# Patient Record
Sex: Female | Born: 1940 | Race: White | Hispanic: No | Marital: Single | Smoking: Never smoker
Health system: Southern US, Community
[De-identification: ages and names within clinical notes are randomized; demographics above are authoritative.]

## PROBLEM LIST (undated history)

## (undated) DIAGNOSIS — D126 Benign neoplasm of colon, unspecified: Secondary | ICD-10-CM

## (undated) DIAGNOSIS — G479 Sleep disorder, unspecified: Secondary | ICD-10-CM

## (undated) DIAGNOSIS — Z8619 Personal history of other infectious and parasitic diseases: Secondary | ICD-10-CM

## (undated) DIAGNOSIS — E785 Hyperlipidemia, unspecified: Secondary | ICD-10-CM

## (undated) DIAGNOSIS — G40909 Epilepsy, unspecified, not intractable, without status epilepticus: Secondary | ICD-10-CM

## (undated) DIAGNOSIS — S82853A Displaced trimalleolar fracture of unspecified lower leg, initial encounter for closed fracture: Secondary | ICD-10-CM

## (undated) DIAGNOSIS — K219 Gastro-esophageal reflux disease without esophagitis: Secondary | ICD-10-CM

## (undated) DIAGNOSIS — Z9049 Acquired absence of other specified parts of digestive tract: Secondary | ICD-10-CM

## (undated) DIAGNOSIS — K5792 Diverticulitis of intestine, part unspecified, without perforation or abscess without bleeding: Secondary | ICD-10-CM

## (undated) DIAGNOSIS — T7840XA Allergy, unspecified, initial encounter: Secondary | ICD-10-CM

## (undated) DIAGNOSIS — F1020 Alcohol dependence, uncomplicated: Secondary | ICD-10-CM

## (undated) DIAGNOSIS — I10 Essential (primary) hypertension: Secondary | ICD-10-CM

## (undated) DIAGNOSIS — R112 Nausea with vomiting, unspecified: Secondary | ICD-10-CM

## (undated) DIAGNOSIS — Z9889 Other specified postprocedural states: Secondary | ICD-10-CM

## (undated) DIAGNOSIS — F419 Anxiety disorder, unspecified: Secondary | ICD-10-CM

## (undated) DIAGNOSIS — J189 Pneumonia, unspecified organism: Secondary | ICD-10-CM

## (undated) DIAGNOSIS — R351 Nocturia: Secondary | ICD-10-CM

## (undated) DIAGNOSIS — A6920 Lyme disease, unspecified: Secondary | ICD-10-CM

## (undated) DIAGNOSIS — K8020 Calculus of gallbladder without cholecystitis without obstruction: Secondary | ICD-10-CM

## (undated) DIAGNOSIS — T8859XA Other complications of anesthesia, initial encounter: Secondary | ICD-10-CM

## (undated) DIAGNOSIS — R569 Unspecified convulsions: Secondary | ICD-10-CM

## (undated) DIAGNOSIS — R011 Cardiac murmur, unspecified: Secondary | ICD-10-CM

## (undated) DIAGNOSIS — K819 Cholecystitis, unspecified: Secondary | ICD-10-CM

## (undated) DIAGNOSIS — T4145XA Adverse effect of unspecified anesthetic, initial encounter: Secondary | ICD-10-CM

## (undated) DIAGNOSIS — Z8489 Family history of other specified conditions: Secondary | ICD-10-CM

## (undated) DIAGNOSIS — M6740 Ganglion, unspecified site: Secondary | ICD-10-CM

## (undated) DIAGNOSIS — C801 Malignant (primary) neoplasm, unspecified: Secondary | ICD-10-CM

## (undated) HISTORY — DX: Calculus of gallbladder without cholecystitis without obstruction: K80.20

## (undated) HISTORY — DX: Acquired absence of other specified parts of digestive tract: Z90.49

## (undated) HISTORY — PX: COLONOSCOPY: SHX174

## (undated) HISTORY — DX: Lyme disease, unspecified: A69.20

## (undated) HISTORY — DX: Personal history of other infectious and parasitic diseases: Z86.19

## (undated) HISTORY — DX: Alcohol dependence, uncomplicated: F10.20

## (undated) HISTORY — PX: MOUTH SURGERY: SHX715

## (undated) HISTORY — PX: TUBAL LIGATION: SHX77

## (undated) HISTORY — DX: Anxiety disorder, unspecified: F41.9

## (undated) HISTORY — DX: Allergy, unspecified, initial encounter: T78.40XA

## (undated) HISTORY — DX: Unspecified convulsions: R56.9

## (undated) HISTORY — PX: WISDOM TOOTH EXTRACTION: SHX21

## (undated) HISTORY — DX: Ganglion, unspecified site: M67.40

---
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
## 2007-07-09 HISTORY — PX: BACK SURGERY: SHX140

## 2007-07-09 HISTORY — PX: THORACIC DISCECTOMY: SHX6113

## 2012-10-29 ENCOUNTER — Ambulatory Visit: Payer: Medicare Other | Admitting: Neurology

## 2012-11-03 ENCOUNTER — Encounter: Payer: Self-pay | Admitting: Neurology

## 2012-11-03 ENCOUNTER — Ambulatory Visit (INDEPENDENT_AMBULATORY_CARE_PROVIDER_SITE_OTHER): Payer: Medicare Other | Admitting: Neurology

## 2012-11-03 VITALS — BP 116/74 | HR 65 | Ht 63.5 in | Wt 136.0 lb

## 2012-11-03 DIAGNOSIS — R569 Unspecified convulsions: Secondary | ICD-10-CM

## 2012-11-03 DIAGNOSIS — G40909 Epilepsy, unspecified, not intractable, without status epilepticus: Secondary | ICD-10-CM | POA: Insufficient documentation

## 2012-11-03 NOTE — Progress Notes (Addendum)
[facility]    Provider:  Dr Hosie Poisson Referring Provider: Maryelizabeth Rowan, MD Primary Care Physician:  No primary provider on file.  CC:  Seizure disorder  HPI:  Joan Mclaughlin is a 72 y.o. female here as a referral from Dr. Duanne Guess for seizure disorder  Patient recently moved from [place], here to establish care. Has history of seizure disorder, believes she may have had seizures as a child, thinks he may have had around for seizures as an adult. Most recent seizures were around 1 year ago, at that time she attributes them to self decreasing her Trileptal and taking an inappropriate sleeping pill. Has been seizure-free for greater than one year. Currently on a regimen of Trileptal 300 mg twice a day. Tolerating this medication well. No adverse effects noted. Has been on Keppra in the past, unable to tolerate this medication. Unclear etiology of seizures. Denies any history of motor vehicle accident, head trauma. Does have a brother with seizures.  During her episode she completely blacks out, has foaming at the mouth, unsure if there are any abnormal movements. Does not have any recollection of the events. No history of tongue biting, has lost control bowels once. No aura or prodrome prior to the event.  Notes difficulty sleeping, uses lorazepam 1 mg at nighttime for this.  Review of Systems: Out of a complete 14 system review, the patient complains of only the following symptoms, and all other reviewed systems are negative. Positive for easy bruising anxiety  History   Social History  . Marital Status: Divorced    Spouse Name: N/A    Number of Children: N/A  . Years of Education: 14   Occupational History  . retired    Social History Main Topics  . Smoking status: Never Smoker   . Smokeless tobacco: Never Used  . Alcohol Use: No  . Drug Use: No  . Sexual Activity: Not on file   Other Topics Concern  . Not on file   Social History Narrative   Patient  lives alone.   patient has 4 grandchildren.   Patient is retired   Patient has 2 years of college.   Patient is right handed.          Family History  Problem Relation Age of Onset  . Arthritis Father     Past Medical History  Diagnosis Date  . Seizures     Past Surgical History  Procedure Laterality Date  . Back surgery  07/2007    Current Outpatient Prescriptions  Medication Sig Dispense Refill  . LORazepam (ATIVAN) 1 MG tablet 1 mg as needed.      . Oxcarbazepine (TRILEPTAL) 300 MG tablet 300 mg 2 (two) times daily.      . valACYclovir (VALTREX) 500 MG tablet 500 mg as needed.       No current facility-administered medications for this visit.    Allergies as of 11/03/2012  . (No Known Allergies)    Vitals: BP 116/74  Pulse 65  Ht 5' 3.5" (1.613 m)  Wt 136 lb (61.689 kg)  BMI 23.71 kg/m2 Last Weight:  Wt Readings from Last 1 Encounters:  11/03/12 136 lb (61.689 kg)   Last Height:   Ht Readings from Last 1 Encounters:  11/03/12 5' 3.5" (1.613 m)     Physical exam: Exam: Gen: NAD, conversant Eyes: anicteric sclerae, moist conjunctivae HENT: Atraumatic, oropharynx clear Neck: Trachea midline; supple,  Lungs: CTA, no wheezing, rales, rhonic  CV: RRR, no MRG Abdomen: Soft, non-tender;  Extremities: No peripheral edema  Skin: Normal temperature, no rash,  Psych: Appropriate affect, pleasant  Neuro: MS: AA&Ox3, appropriately interactive, normal affect   Speech: fluent w/o paraphasic error  Memory: good recent and remote recall  CN: PERRL, EOMI no nystagmus, no ptosis, sensation intact to LT V1-V3 bilat, face symmetric, no weakness, hearing grossly intact, palate elevates symmetrically, shoulder shrug 5/5 bilat,  tongue protrudes midline, no fasiculations noted.  Motor: normal bulk and tone Strength: 5/5  In all extremities  Coord: rapid alternating and point-to-point (FNF, HTS) movements intact.  Reflexes:  symmetrical, bilat downgoing toes  Sens: LT intact in all extremities  Gait: posture, stance, stride and arm-swing normal. Tandem gait intact. Able to walk on heels and toes. Romberg absent.   Assessment:  After physical and neurologic examination, review of laboratory studies, imaging, neurophysiology testing and pre-existing records, assessment will be reviewed on the problem list.  Plan:  Treatment plan and additional workup will be reviewed under Problem List.  1)Seizure disorder 2)Insomnia  72 year old woman with history of seizure disorder presenting for initial evaluation and transfer of care. Patient is currently well controlled on a regimen of Trileptal 300 mg twice a day. Tolerating well with no adverse effects. Physical exam is unremarkable. Patient has been seizure-free greater than one year on this current dosing schedule. Will not make any changes at this time. Patient also continues to take lorazepam 1 mg nightly for sleep difficulty. Will continue at this current dose 2. Patient wishes to have medications dispensed via primary care physician. Will send copy of note to PCP, Dr Duanne Guess. Will be happy to fill prescriptions if patient changes mind. Follow up in 1 year or early if needed.

## 2012-11-03 NOTE — Patient Instructions (Signed)
Overall you are doing fairly well but I do want to suggest a few things today:   Remember to drink plenty of fluid, eat healthy meals and do not skip any meals. Try to eat protein with a every meal and eat a healthy snack such as fruit or nuts in between meals. Try to keep a regular sleep-wake schedule and try to exercise daily, particularly in the form of walking, 20-30 minutes a day, if you can.   As far as your medications are concerned, I would like to suggest continuing the trileptal at its current dose and schedule.  I would like to see you back in 12 months, sooner if we need to. Please call us with any interim questions, concerns, problems, updates or refill requests.   My clinical assistant and will answer any of your questions and relay your messages to me and also relay most of my messages to you.   Our phone number is [phone]. We also have an after hours call service for urgent matters and there is a physician on-call for urgent questions. For any emergencies you know to call 911 or go to the nearest emergency room

## 2012-11-17 ENCOUNTER — Ambulatory Visit (INDEPENDENT_AMBULATORY_CARE_PROVIDER_SITE_OTHER): Payer: Medicare Other | Admitting: Podiatry

## 2012-11-17 ENCOUNTER — Encounter: Payer: Self-pay | Admitting: Podiatry

## 2012-11-17 ENCOUNTER — Ambulatory Visit: Payer: Medicare Other | Admitting: Podiatry

## 2012-11-17 VITALS — BP 124/76 | HR 74 | Resp 20 | Ht 63.5 in | Wt 136.0 lb

## 2012-11-17 DIAGNOSIS — L600 Ingrowing nail: Secondary | ICD-10-CM

## 2012-11-17 NOTE — Progress Notes (Signed)
  Subjective:    Patient ID: Joan Mclaughlin, female    DOB: [birth year], 72 y.o.   MRN: [id number] "I had a pedicure and she made my 2nd toe bleed on my left foot.  I think it's ingrown.  I want him to check them all." No history of prior symptoms until approximately a week ago.  HPI Comments: N  Sore, tender, throb, ache L  Ingrown 2nd left D  1 week O  Gradually C  Gotten a little better A  Walking T  Pedicure, Neosporin, bandage      Review of Systems  Constitutional: Negative.   HENT: Positive for sinus pressure.   Eyes: Positive for itching.  Respiratory: Negative.   Cardiovascular: Negative.   Gastrointestinal: Negative.   Endocrine: Negative.   Genitourinary: Positive for urgency.  Musculoskeletal: Negative.   Skin: Negative.   Neurological: Positive for seizures.  Hematological: Bruises/bleeds easily.  Psychiatric/Behavioral: Positive for sleep disturbance.       Objective:   Physical Exam 72 year old white female orientated x3  Vascular: The DP and PT pulses are two over four bilaterally  Neurological: knee and ankle reflexes equal and reactive bilaterally  Dermatological: Mild incurvation on the medial border of the second left toenail noted. No erythema edema or drainage noted.  Musculoskeletal: HAV deformities noted bilaterally.       Assessment & Plan:   Assessment: Ingrowing medial border of the second left toenail. HAV deformities bilaterally  Plan: At this time patient symptoms are surrounding the second left toenail have resolved. I do not think she needs any active treatment at this time. She is advised to wear soft shoes to avoid irritation to her bunion deformities. Reappoint at her request  Richard C.Leeanne Deed, DPM

## 2013-05-01 ENCOUNTER — Ambulatory Visit (INDEPENDENT_AMBULATORY_CARE_PROVIDER_SITE_OTHER): Payer: Medicare Other | Admitting: Neurology

## 2013-05-01 ENCOUNTER — Encounter: Payer: Self-pay | Admitting: Neurology

## 2013-05-01 DIAGNOSIS — G4700 Insomnia, unspecified: Secondary | ICD-10-CM

## 2013-05-01 DIAGNOSIS — R569 Unspecified convulsions: Secondary | ICD-10-CM

## 2013-05-01 MED ORDER — LORAZEPAM 1 MG PO TABS: ORAL_TABLET | ORAL | Status: DC

## 2013-05-01 MED ORDER — NORTRIPTYLINE HCL 10 MG PO CAPS: ORAL_CAPSULE | ORAL | Status: DC

## 2013-05-01 NOTE — Patient Instructions (Addendum)
1. Start nortriptyline 10mg : 1 cap at bedtime for 1 week, then increase to 2 caps at bedtime 2. Start reducing lorazepam 1mg : Take 3/4 tab at bedtime for 1 month, then reduce to 1/2 tab at bedtime for a month 3. Continue Trileptal 300mg  twice a day  Seizure precautions: 1. If medication has been prescribed for you to prevent seizures, take it exactly as directed.  Do not stop taking the medicine without talking to your doctor first, even if you have not had a seizure in a long time.   2. Avoid activities in which a seizure would cause danger to yourself or to others.  Don't operate dangerous machinery, swim alone, or climb in high or dangerous places, such as on ladders, roofs, or girders.  Do not drive unless your doctor says you may.  3. If you have any warning that you may have a seizure, lay down in a safe place where you can't hurt yourself.    4.  No driving for 6 months from last seizure, as per [facility].   Please refer to the following link on the [place] website for more information: http://[email or web address].org/[email or web address]/driving/drivingu.cfm   5.  Maintain good sleep hygiene.  6.  Contact your doctor if you have any problems that may be related to the medicine you are taking.  7.  Call 911 and bring the patient back to the ED if:        A.  The seizure lasts longer than 5 minutes.       B.  The patient doesn't awaken shortly after the seizure  C.  The patient has new problems such as difficulty seeing, speaking or moving  D.  The patient was injured during the seizure  E.  The patient has a temperature over 102 F (39C)  F.  The patient vomited and now is having trouble breathing. 2 month follow up

## 2013-05-01 NOTE — Progress Notes (Addendum)
NEUROLOGY CONSULTATION NOTE  Joan Mclaughlin MRN: [id number] DOB: January 28, 1940  Referring provider: Dr. [name] Primary care provider: Dr. [name]  Reason for consult:  Establish care for seizures  Dear Dr Ernie Hew:  Thank you for your kind referral of Joan Mclaughlin for consultation of the above symptoms. Although her history is well known to you, please allow me to reiterate it for the purpose of our medical record. She was unable to bring her records from [place] today.  HISTORY OF PRESENT ILLNESS: This is a 73 year old right-handed woman with a history of seizures diagnosed in 2008.  She was in [place] and was sleep deprived, started feeling strange, "like looking from the outside in," then fell down with a generalized convulsion.  She lives in [place] and had seen a neurologist, started on Trileptal, which she had been taking for several years until she self-reduced dose by half.  She had been taking this lower dose for 2 years with no seizures until July 2013.  At that time, she had been taking lorazepam 1mg  qhs for several years and did not take it the night prior, instead took a different over the counter sleep aid. She woke up with blood on her face and bowel incontinence, then apparently had another seizure that was witnessed by her son with foaming at the mouth.  She was started on Keppra but felt weird on this and was put back on Trileptal 300mg  BID since then, with no further seizures.  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  She "always fainted" in church.  In college, she was drinking the night before and recalls looking in the mirror, then found herself on the floor. She denies any gaps in time, no staring/unresponsive episodes, olfactory/gustatory hallucinations, focal numbness/tingling/weakness, myoclonic jerks.  She has never been a good sleeper, and has tried all the over the  counter medications with no effect.  She has tried Ambien (felt hungover), Lunesta and Melatonin gave her weird nightmarish thoughts.  She has been taking lorazepam nightly for at least 3 or 4 years, and this is the only medication that helps her without feeling hungover the next day.  She denies any headaches, dizziness, diplopia, dysarthria, dysphagia, neck/back pain, bowel/bladder dysfunction. She had seen neurologist Dr. Janann Colonel 6 months ago, then saw epileptologist Dr. Inocente Salles at [facility] last month.  She reports being scheduled for an EEG and MRI but these have not yet been done.   Epilepsy Risk Factors:  Her brother has seizures.  Another brother may have seizures also, he takes phenobarbital.  Her nephew may have seizures as well.  Otherwise she had a normal birth and early development.  There is no history of febrile convulsions, CNS infections such as meningitis/encephalitis, significant traumatic brain injury, neurosurgical procedures.  PAST MEDICAL HISTORY: Past Medical History  Diagnosis Date  . Seizures   . Anxiety   . Allergy     PAST SURGICAL HISTORY: Past Surgical History  Procedure Laterality Date  . Back surgery  07/2007  . Tubal ligation    . Mouth surgery      MEDICATIONS: Current Outpatient Prescriptions on File Prior to Visit  Medication Sig Dispense Refill  . Oxcarbazepine (TRILEPTAL) 300 MG tablet 300 mg 2 (two) times daily.      . valACYclovir (VALTREX) 500 MG tablet 500 mg as needed.       No current facility-administered medications on file  prior to visit.    ALLERGIES: No Known Allergies  FAMILY HISTORY: Family History  Problem Relation Age of Onset  . Arthritis Father     SOCIAL HISTORY: History   Social History  . Marital Status: Divorced    Spouse Name: N/A    Number of Children: N/A  . Years of Education: 14   Occupational History  . retired    Social History Main Topics  . Smoking status: Never Smoker   . Smokeless tobacco: Never Used    . Alcohol Use: No  . Drug Use: No  . Sexual Activity: Not on file   Other Topics Concern  . Not on file   Social History Narrative   Patient lives alone.   patient has 4 grandchildren.   Patient is retired   Patient has 2 years of college.   Patient is right handed.          REVIEW OF SYSTEMS: Constitutional: No fevers, chills, or sweats, no generalized fatigue, change in appetite Eyes: No visual changes, double vision, eye pain Ear, nose and throat: No hearing loss, ear pain, nasal congestion, sore throat Cardiovascular: No chest pain, palpitations Respiratory:  No shortness of breath at rest or with exertion, wheezes GastrointestinaI: No nausea, vomiting, occasional diarrhea, no abdominal pain, fecal incontinence Genitourinary:  No dysuria, urinary retention or frequency Musculoskeletal:  No neck pain, back pain Integumentary: No rash, pruritus, skin lesions Neurological: as above Psychiatric: No depression, +insomnia, + anxiety Endocrine: No palpitations, fatigue, diaphoresis, mood swings, change in appetite, change in weight, increased thirst Hematologic/Lymphatic:  No anemia, purpura, petechiae. Allergic/Immunologic: no itchy/runny eyes, nasal congestion, recent allergic reactions, rashes  PHYSICAL EXAM: There were no vitals filed for this visit. General: No acute distress Head:  Normocephalic/atraumatic Neck: supple, no paraspinal tenderness, full range of motion Back: No paraspinal tenderness Heart: regular rate and rhythm Lungs: Clear to auscultation bilaterally. Vascular: No carotid bruits. Skin/Extremities: No rash, no edema Neurological Exam: Mental status: alert and oriented to person, place, and time, no dysarthria or dysphagia. Fund of knowledge is appropriate.  Recent and remote memory intact.  Attention span and concentration normal.  Repeats and names without difficulty. Cranial nerves: CN I: not tested CN II: pupils equal, round and reactive to  light, visual fields intact, fundi unremarkable. CN III, IV, VI:  full range of motion, no nystagmus, no ptosis CN V: facial sensation intact CN VII: upper and lower face symmetric CN VIII: hearing intact CN IX, X: gag intact, uvula midline CN XI: sternocleidomastoid and trapezius muscles intact CN XII: tongue midline Bulk & Tone: normal, no fasciculations. Motor: 5/5 throughout with no pronator drift. Sensation: intact to light touch, cold, pin, vibration and joint position sense.  No extinction to double simultaneous stimulation.  Romberg test negative Deep Tendon Reflexes: +2 throughout, no clonus Plantar responses: downgoing bilaterally Finger to nose testing: no incoordination Gait: narrow-based and steady, able to tandem walk adequately.  IMPRESSION: This is a 73 year old right-handed woman with a history of episodes of loss of consciousness as a teenager, and 2 witnessed convulsions in 2008 and 2013.  The last seizure occurred in the setting of reduced Trileptal dose and not taking lorazepam the night prior.  She has been taking lorazepam 1mg  qhs for several years for insomnia.  We discussed tolerance and dependence potential on this medication, I would recommend using a different medication for sleep.  She was very concerned about the prescription for lorazepam and reducing dose, and was  reassured that we would do the taper very slowly.  She will start nortriptyline 10mg  qhs with plans for uptitration as tolerated.  Side effects were discussed.  She will start reducing lorazepam to 0.75mg  qhs x 1 month, then 0.5mg  qhs for 1 month, with plans for further taper.  Continue Trileptal 300mg  BID.  Records from [place] will be brought by patient for review.  [place] driving laws were discussed with the patient, and she knows to stop driving after a seizure, until 6 months seizure-free.   Thank you for allowing me to participate in the care of this patient. Please do not hesitate to call for any  questions or concerns.   Ellouise Newer, M.D.     Records from her previous neurologists were reviewed:   Routine EEG by Dr. Sonny Masters 07/2005: Left posterior frontal sharp wave activity with phase reversal at F7. Left frontal sharp and slow activity. Abnormal EEG with posterior frontal low activity compatible with seizure disorder with left frontal focus. Routine EEG by Dr. Sonny Masters 06/2007: Spike activity with phase reversal noted at [facility]. Hyperventilation denoted spike activity.  Impression: Left frontal region irritative lesion compatible with underlying seizures. Routine EEG by Dr. Sonny Masters 05/2009: Left temporal spike activity noted with phase reversal T3. Impression: Abnormal due to irritative lesion left temporal region compatible with underlying seizure disorder. MRI brain 04/2006: There are scattered punctate foci of signal abnormality in the cerebral white matter bilaterally.  These findings are consistent with mild microvascular ischemia.    Per Dr. 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

## 2013-05-02 ENCOUNTER — Encounter: Payer: Self-pay | Admitting: Neurology

## 2013-05-02 DIAGNOSIS — G4700 Insomnia, unspecified: Secondary | ICD-10-CM | POA: Insufficient documentation

## 2013-05-04 ENCOUNTER — Telehealth: Payer: Self-pay | Admitting: *Deleted

## 2013-05-04 ENCOUNTER — Telehealth: Payer: Self-pay | Admitting: Neurology

## 2013-05-04 NOTE — Telephone Encounter (Signed)
Message copied by Chester Holstein on Mon May 04, 2013 11:25 AM ------      Message from: Cameron Sprang      Created: Mon May 04, 2013  8:07 AM       Can you pls add on her record that PCP is Dr. [name] and pls fax note to PCP, thanks! ------

## 2013-05-04 NOTE — Telephone Encounter (Signed)
Pls have her contact PCP regarding joint pains, thanks

## 2013-05-04 NOTE — Telephone Encounter (Signed)
Pt called requesting to speak to a nurse regarding some questions and concerns she has. Please call Pt.

## 2013-05-04 NOTE — Telephone Encounter (Signed)
Patient called to let you know that she is also having sore joints.  She wanted to know if you thought she might need lyme test.  Her PCP can't order this because he is out of network.

## 2013-05-04 NOTE — Telephone Encounter (Signed)
Note faxed and PCP added.

## 2013-05-04 NOTE — Telephone Encounter (Signed)
Pt.notified

## 2013-05-05 ENCOUNTER — Telehealth: Payer: Self-pay | Admitting: *Deleted

## 2013-05-05 NOTE — Telephone Encounter (Signed)
Patient would like to take Melatonin and ativan instead of  nortriptyline

## 2013-05-05 NOTE — Telephone Encounter (Signed)
Patient notified

## 2013-05-05 NOTE — Telephone Encounter (Signed)
She can try the melatonin 3mg  at bedtime and continue with plan for very slow taper of Ativan. If melatonin ineffective, I would recommend the nortriptyline. Thanks

## 2013-06-23 ENCOUNTER — Ambulatory Visit (INDEPENDENT_AMBULATORY_CARE_PROVIDER_SITE_OTHER): Payer: Medicare Other | Admitting: Neurology

## 2013-06-23 ENCOUNTER — Encounter: Payer: Self-pay | Admitting: Neurology

## 2013-06-23 ENCOUNTER — Ambulatory Visit: Payer: Medicare Other | Admitting: Neurology

## 2013-06-23 VITALS — BP 104/68 | HR 66 | Temp 97.5°F | Resp 20 | Wt 138.8 lb

## 2013-06-23 DIAGNOSIS — G4700 Insomnia, unspecified: Secondary | ICD-10-CM

## 2013-06-23 DIAGNOSIS — R569 Unspecified convulsions: Secondary | ICD-10-CM

## 2013-06-23 MED ORDER — ESZOPICLONE 1 MG PO TABS: 1.0000 mg | ORAL_TABLET | Freq: Every evening | ORAL | Status: DC | PRN

## 2013-06-23 NOTE — Patient Instructions (Signed)
1. Continue Trileptal 300mg  twice a day 2. Continue with taper of lorazepam 1mg : Take 1/2 tab at night until end of month, then 1/4 tab at night for a month, then 1/4 tab every other night for a month, then stop 3. Start Lunesta 1mg  at night as needed for insomnia 4. Follow-up in 6 months

## 2013-06-23 NOTE — Progress Notes (Signed)
NEUROLOGY FOLLOW UP OFFICE NOTE  Joan Mclaughlin [id number]  HISTORY OF PRESENT ILLNESS: I had the pleasure of seeing Joan Mclaughlin in follow-up in the neurology clinic on 06/23/2013.  The patient was last seen 2 months ago to establish care for seizures and insomnia.  She has been seizure-free since July 2013 on Trileptal 300mg  BID.  Her main issue was insomnia, and she had been taking lorazepam 1mg  nightly, which we had agreed to slowly taper and to try a different sleep medication.  She did not start the nortriptyline, and opted to take melatonin instead.  She takes melatonin 6mg  qhs and has been slowly weaning off lorazepam by 1/4 tablet every month. She is now on 0.5mg  qhs until the end of the month, then will continue taper.  She reports that sleep continues to be an issue.  Last night she only slept 4-1/2 hours.  She has difficulties with sleep maintenance.  She had tried Ambien in the past, which helped but caused side effects of depression and feeling sluggish the next day.    She denies any headaches, dizziness, diplopia, focal numbness/tingling/weakness.  She has a sore right knee and tells me she has been diagnosed with Lyme disease and took antibiotics for 2 weeks. She has a mouth sore and takes Valtrex.  HPI:  This is a 73 yo RH woman with a history of seizures diagnosed in 2008. She was in [place] and was sleep deprived, started feeling strange, "like looking from the outside in," then fell down with a generalized convulsion. She lives in [place] and had seen a neurologist, started on Trileptal, which she had been taking for several years until she self-reduced dose by half. She had been taking this lower dose for 2 years with no seizures until July 2013. At that time, she had been taking lorazepam 1mg  qhs for several years and did not take it the night prior, instead took a different over the counter sleep aid. She woke up with blood on her face and bowel incontinence, then apparently  had another seizure that was witnessed by her son with foaming at the mouth. She was started on Keppra but felt weird on this and was put back on Trileptal 300mg  BID since then, with no further seizures.   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 She "always fainted" in church. In college, she was drinking the night before and recalls looking in the mirror, then found herself on the floor. She denies any gaps in time, no staring/unresponsive episodes, olfactory/gustatory hallucinations, focal numbness/tingling/weakness, myoclonic jerks. She has never been a good sleeper, and has tried all the over the counter medications with no effect. She has tried Ambien (felt hungover), Lunesta and Melatonin gave her weird nightmarish thoughts. She has been taking lorazepam nightly for at least 3 or 4 years, and this is the only medication that helps her without feeling hungover the next day.   Epilepsy Risk Factors: Her brother has seizures. Another brother may have seizures also, he takes phenobarbital. Her nephew may have seizures as well. Otherwise she had a normal birth and early development. There is no history of febrile convulsions, CNS infections such as meningitis/encephalitis, significant traumatic brain injury, neurosurgical procedures.  Diagnostic Data: Routine EEG by Dr. Sonny Masters 07/2005: Left posterior frontal sharp wave activity with phase reversal at F7. Left frontal sharp and slow activity. Abnormal EEG with posterior frontal low activity compatible with seizure  disorder with left frontal focus.  Routine EEG by Dr. Sonny Masters 06/2007: Spike activity with phase reversal noted at [facility]. Hyperventilation denoted spike activity. Impression: Left frontal region irritative lesion compatible with underlying seizures.  Routine EEG by Dr. Sonny Masters 05/2009: Left temporal spike activity noted with phase reversal T3. Impression: Abnormal due to  irritative lesion left temporal region compatible with underlying seizure disorder.  MRI brain 04/2006: There are scattered punctate foci of signal abnormality in the cerebral white matter bilaterally. These findings are consistent with mild microvascular ischemia.  Per Dr. Prentice Docker note: "Reviewed MRI head, MRI brain from March 28, 2006 demonstrates atrophy in the medial temporal lobes, more prominent on the right." She had a seizure in July 2013 felt to be provoked by diphenhydramine and low dose Trileptal.  PAST MEDICAL HISTORY: Past Medical History  Diagnosis Date  . Seizures   . Anxiety   . Allergy     MEDICATIONS: Current Outpatient Prescriptions on File Prior to Visit  Medication Sig Dispense Refill  . LORazepam (ATIVAN) 1 MG tablet Take 1 tab at bedtime  30 tablet  2  . Oxcarbazepine (TRILEPTAL) 300 MG tablet 300 mg 2 (two) times daily.      . valACYclovir (VALTREX) 500 MG tablet 500 mg as needed.      . nortriptyline (PAMELOR) 10 MG capsule Take 1 cap at bedtime for 1 week, then increase to 2 caps at bedtime  60 capsule  3   No current facility-administered medications on file prior to visit.    ALLERGIES: No Known Allergies  FAMILY HISTORY: Family History  Problem Relation Age of Onset  . Arthritis Father     SOCIAL HISTORY: History   Social History  . Marital Status: Divorced    Spouse Name: N/A    Number of Children: N/A  . Years of Education: 14   Occupational History  . retired    Social History Main Topics  . Smoking status: Never Smoker   . Smokeless tobacco: Never Used  . Alcohol Use: No  . Drug Use: No  . Sexual Activity: Not on file   Other Topics Concern  . Not on file   Social History Narrative   Patient lives alone.   patient has 4 grandchildren.   Patient is retired   Patient has 2 years of college.   Patient is right handed.          REVIEW OF SYSTEMS: Constitutional: No fevers, chills, or sweats, no generalized fatigue, change  in appetite Eyes: No visual changes, double vision, eye pain Ear, nose and throat: No hearing loss, ear pain, nasal congestion, sore throat Cardiovascular: No chest pain, palpitations Respiratory:  No shortness of breath at rest or with exertion, wheezes GastrointestinaI: No nausea, vomiting, diarrhea, abdominal pain, fecal incontinence Genitourinary:  No dysuria, urinary retention or frequency Musculoskeletal:  No neck pain, back pain Integumentary: No rash, pruritus, skin lesions Neurological: as above Psychiatric: No depression, +insomnia, anxiety Endocrine: No palpitations, fatigue, diaphoresis, mood swings, change in appetite, change in weight, increased thirst Hematologic/Lymphatic:  No anemia, purpura, petechiae. Allergic/Immunologic: no itchy/runny eyes, nasal congestion, recent allergic reactions, rashes  PHYSICAL EXAM: Filed Vitals:   06/23/13 1249  BP: 104/68  Pulse: 66  Temp: 97.5 F (36.4 C)  Resp: 20   General: No acute distress Head:  Normocephalic/atraumatic Neck: supple, no paraspinal tenderness, full range of motion Heart:  Regular rate and rhythm Lungs:  Clear to auscultation bilaterally Back: No paraspinal tenderness Skin/Extremities: No rash, no  edema Neurological Exam: alert and oriented to person, place, and time. No aphasia or dysarthria. Fund of knowledge is appropriate.  Recent and remote memory are intact.  Attention and concentration are normal.    Able to name objects and repeat phrases. Cranial nerves: Pupils equal, round, reactive to light.  Fundoscopic exam unremarkable, no papilledema. Extraocular movements intact with no nystagmus. Visual fields full. Facial sensation intact. No facial asymmetry. Tongue, uvula, palate midline.  Motor: Bulk and tone normal, muscle strength 5/5 throughout with no pronator drift.  Sensation to light touch, temperature and vibration intact.  No extinction to double simultaneous stimulation.  Deep tendon reflexes 2+  throughout, toes downgoing.  Finger to nose testing intact.  Gait narrow-based and steady, able to tandem walk adequately.  Romberg negative.  IMPRESSION: This is a 73 yo RH woman with a history of episodes of loss of consciousness as a teenager, and 2 witnessed convulsions in 2008 and 2013. The last seizure occurred in the setting of reduced Trileptal dose and not taking lorazepam the night prior. 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  She has been taking lorazepam 1mg  qhs for several years for insomnia and has been able to wean down dose, now at 0.5mg  qhs, however she continues to have sleep problems.  She is agreeable to a retrial of Lunesta before trying nortriptyline. She will continue to taper off lorazepam by 1/4 tablet every month. Continue Trileptal 300mg  BID. She is aware of [place] driving laws, and she knows to stop driving after a seizure, until 6 months seizure-free. She will follow-up in 6 months.   Thank you for allowing me to participate in her care.  Please do not hesitate to call for any questions or concerns.  The duration of this appointment visit was 25 minutes of face-to-face time with the patient.  Greater than 50% of this time was spent in counseling, explanation of diagnosis, planning of further management, and coordination of care.   Joan Mclaughlin, M.D.   CC: Dr. Chapman Fitch

## 2013-06-24 ENCOUNTER — Telehealth: Payer: Self-pay | Admitting: Neurology

## 2013-06-24 NOTE — Telephone Encounter (Signed)
Pt called f/u if the authorization has came in for her meds. C/b [id number]

## 2013-06-24 NOTE — Telephone Encounter (Signed)
Returning your call [phone]

## 2013-06-24 NOTE — Telephone Encounter (Signed)
Please call regarding questions about medications and other medical questions / Joan Mclaughlin. CB# [phone]

## 2013-06-25 NOTE — Telephone Encounter (Signed)
Prior approval made patient notified

## 2013-07-21 ENCOUNTER — Ambulatory Visit: Payer: Medicare Other | Admitting: Neurology

## 2013-09-17 ENCOUNTER — Telehealth: Payer: Self-pay | Admitting: *Deleted

## 2013-09-17 NOTE — Telephone Encounter (Signed)
Patient had questions about a medication please call

## 2013-09-18 ENCOUNTER — Other Ambulatory Visit: Payer: Self-pay | Admitting: Family Medicine

## 2013-09-18 MED ORDER — OXCARBAZEPINE 300 MG PO TABS: 300.0000 mg | ORAL_TABLET | Freq: Two times a day (BID) | ORAL | Status: DC

## 2013-09-18 NOTE — Telephone Encounter (Signed)
Is the $600 for how many pills? Because we can send Rx for #32 pills (just the 16 days she is out) until the refill comes. I would not advise reducing it, because in the past she had a seizure on lower dose Trileptal even if there were other factors involved.

## 2013-09-18 NOTE — Telephone Encounter (Signed)
I spoke with patient. She states she will be out of her Trileptal before she is due for her refill on Oct. 7th. She is taking 300 mg bid. She does get 90 day supply. She states she doesn't know if she by mistake took more pills at her 2nd dosing a few times & that may be why she is going to be out of pills early. She did check the pricing out of pocket for Rx which would be $600.00. She wants to know what she can do. She asked if there is something else she can take in the meantime, or if she can take 1/2 pill in the am & 1 pill in the evening. She has about a 10 day supply left. Please advise.

## 2013-09-18 NOTE — Telephone Encounter (Signed)
Called pharmacy, they did get Rx, they put on hold because patient wasn't due yet. I did tell them to go ahead and fill Rx that patient was going to pay out of pocket. Out of pocket price is $93.00.  I did call patient back to let her Rx was ready and did give her the out of pocket price.

## 2013-09-18 NOTE — Telephone Encounter (Signed)
Pt wants to know the status of the refill she states that the drug stores does not have it please call her at [phone]

## 2013-09-18 NOTE — Telephone Encounter (Signed)
I spoke with patient. She is agreeable to Rx for #32 pills. Rx sent to patient's pharmacy.

## 2013-10-26 ENCOUNTER — Encounter: Payer: Self-pay | Admitting: Podiatry

## 2013-10-26 ENCOUNTER — Ambulatory Visit (INDEPENDENT_AMBULATORY_CARE_PROVIDER_SITE_OTHER): Payer: Medicare Other | Admitting: Podiatry

## 2013-10-26 VITALS — BP 125/78 | HR 69 | Resp 12 | Ht 63.0 in | Wt 140.0 lb

## 2013-10-26 DIAGNOSIS — L84 Corns and callosities: Secondary | ICD-10-CM

## 2013-10-26 DIAGNOSIS — M2042 Other hammer toe(s) (acquired), left foot: Secondary | ICD-10-CM

## 2013-10-26 NOTE — Progress Notes (Signed)
   Subjective:    Patient ID: Joan Mclaughlin, female    DOB: 1940-06-05, 73 y.o.   MRN: [id number]  HPI Comments: N nail problem L left 5th medial and lateral toenail borders D 1 -2 months O left left walks different from right C soreness A left 5th toe rolls beneath the 4th toe T none     Review of Systems  All other systems reviewed and are negative.      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

## 2013-10-27 ENCOUNTER — Encounter: Payer: Self-pay | Admitting: Podiatry

## 2013-11-05 ENCOUNTER — Telehealth: Payer: Self-pay | Admitting: Neurology

## 2013-11-05 ENCOUNTER — Ambulatory Visit: Payer: Medicare Other | Admitting: Neurology

## 2013-11-05 NOTE — Telephone Encounter (Signed)
Pt states that she needs  To talk to someone about medication please call [phone]

## 2013-11-05 NOTE — Telephone Encounter (Signed)
Returned call. Patient had some questions about anxiety & anxiety meds she had been on. Wanting to get recommendations on maybe a different med than she has been on before. I did advise this is something she needs to discuss with her pcp as we don't treat anxiety here. She said she would contact her pcp.

## 2014-02-09 ENCOUNTER — Encounter: Payer: Self-pay | Admitting: Neurology

## 2014-02-09 ENCOUNTER — Ambulatory Visit (INDEPENDENT_AMBULATORY_CARE_PROVIDER_SITE_OTHER): Payer: Medicare Other | Admitting: Neurology

## 2014-02-09 VITALS — BP 110/72 | HR 72 | Resp 16 | Ht 63.0 in | Wt 145.0 lb

## 2014-02-09 DIAGNOSIS — G4700 Insomnia, unspecified: Secondary | ICD-10-CM

## 2014-02-09 DIAGNOSIS — G40009 Localization-related (focal) (partial) idiopathic epilepsy and epileptic syndromes with seizures of localized onset, not intractable, without status epilepticus: Secondary | ICD-10-CM

## 2014-02-09 MED ORDER — OXCARBAZEPINE 300 MG PO TABS: 300.0000 mg | ORAL_TABLET | Freq: Two times a day (BID) | ORAL | Status: DC

## 2014-02-09 NOTE — Patient Instructions (Signed)
1. Continue Trileptal 300mg  twice a day 2. Continue working on sleep habits  Seizure Precautions: 1. If medication has been prescribed for you to prevent seizures, take it exactly as directed.  Do not stop taking the medicine without talking to your doctor first, even if you have not had a seizure in a long time.   2. Avoid activities in which a seizure would cause danger to yourself or to others.  Don't operate dangerous machinery, swim alone, or climb in high or dangerous places, such as on ladders, roofs, or girders.  Do not drive unless your doctor says you may.  3. If you have any warning that you may have a seizure, lay down in a safe place where you can't hurt yourself.    4.  No driving for 6 months from last seizure, as per [facility].   Please refer to the following link on the [place] website for more information: http://[email or web address].org/[email or web address]/driving/drivingu.cfm   5.  Maintain good sleep hygiene.  6.  Contact your doctor if you have any problems that may be related to the medicine you are taking.  7.  Call 911 and bring the patient back to the ED if:        A.  The seizure lasts longer than 5 minutes.       B.  The patient doesn't awaken shortly after the seizure  C.  The patient has new problems such as difficulty seeing, speaking or moving  D.  The patient was injured during the seizure  E.  The patient has a temperature over 102 F (39C)  F.  The patient vomited and now is having trouble breathing

## 2014-02-09 NOTE — Progress Notes (Signed)
NEUROLOGY FOLLOW UP OFFICE NOTE  CHAVY AVERA [id number]  HISTORY OF PRESENT ILLNESS: I had the pleasure of seeing Nhi Butrum in follow-up in the neurology clinic on 02/09/2014.  The patient was last seen 8 months ago for seizures. She has been seizure-free since July 2013 on Trileptal 300mg  BID. She continues to do well with no seizures or seizure-like symptoms. She denies any headaches, dizziness, diplopia, focal numbness/tingling/weakness. She continues to have sleep difficulties but would feel hung over with the medications tried. She is currently on low dose Xanax prescribed by her PCP, which would give her around 6 hours of sleep. She has noticed consuming caffeine prior to bedtime keeps her awake.  HPI: This is a 74 yo RH woman with a history of seizures diagnosed in 2008. She was in [place] and was sleep deprived, started feeling strange, "like looking from the outside in," then fell down with a generalized convulsion. She was living in [place] and had seen a neurologist, started on Trileptal, which she had been taking for several years until she self-reduced dose by half. She had been taking this lower dose for 2 years with no seizures until July 2013. At that time, she had been taking lorazepam 1mg  qhs for several years and did not take it the night prior, instead took a different over the counter sleep aid. She woke up with blood on her face and bowel incontinence, then apparently had another seizure that was witnessed by her son with foaming at the mouth. She was started on Keppra but felt weird on this and was put back on Trileptal 300mg  BID since then, with no further seizures.   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 She "always fainted" in church. In college, she was drinking the night before and recalls looking in the mirror, then found herself on the floor. She denies any gaps in time, no  staring/unresponsive episodes, olfactory/gustatory hallucinations, focal numbness/tingling/weakness, myoclonic jerks. She has never been a good sleeper, and has tried all the over the counter medications with no effect. She has tried Ambien (felt hungover), Lunesta and Melatonin gave her weird nightmarish thoughts. She has been taking lorazepam nightly for at least 3 or 4 years, and this is the only medication that helps her without feeling hungover the next day.   Epilepsy Risk Factors: Her brother has seizures. Another brother may have seizures also, he takes phenobarbital. Her nephew may have seizures as well. Otherwise she had a normal birth and early development. There is no history of febrile convulsions, CNS infections such as meningitis/encephalitis, significant traumatic brain injury, neurosurgical procedures.  Diagnostic Data: Routine EEG by Dr. Sonny Masters 07/2005: Left posterior frontal sharp wave activity with phase reversal at F7. Left frontal sharp and slow activity. Abnormal EEG with posterior frontal low activity compatible with seizure disorder with left frontal focus.  Routine EEG by Dr. Sonny Masters 06/2007: Spike activity with phase reversal noted at [facility]. Hyperventilation denoted spike activity. Impression: Left frontal region irritative lesion compatible with underlying seizures.  Routine EEG by Dr. Sonny Masters 05/2009: Left temporal spike activity noted with phase reversal T3. Impression: Abnormal due to irritative lesion left temporal region compatible with underlying seizure disorder.  MRI brain 04/2006: There are scattered punctate foci of signal abnormality in the cerebral white matter bilaterally. These findings are consistent with mild microvascular ischemia.  Per Dr. Prentice Docker note: "Reviewed MRI head, MRI brain from March 28, 2006 demonstrates  atrophy in the medial temporal lobes, more prominent on the right." She had a seizure in July 2013 felt to be provoked by diphenhydramine and low  dose Trileptal  PAST MEDICAL HISTORY: Past Medical History  Diagnosis Date  . Seizures   . Anxiety   . Allergy     MEDICATIONS: Current Outpatient Prescriptions on File Prior to Visit  Medication Sig Dispense Refill  . Oxcarbazepine (TRILEPTAL) 300 MG tablet Take 1 tablet (300 mg total) by mouth 2 (two) times daily. 32 tablet 0  . valACYclovir (VALTREX) 500 MG tablet 500 mg as needed.     No current facility-administered medications on file prior to visit.    ALLERGIES: No Known Allergies  FAMILY HISTORY: Family History  Problem Relation Age of Onset  . Arthritis Father     SOCIAL HISTORY: History   Social History  . Marital Status: Divorced    Spouse Name: N/A    Number of Children: N/A  . Years of Education: 14   Occupational History  . retired    Social History Main Topics  . Smoking status: Never Smoker   . Smokeless tobacco: Never Used  . Alcohol Use: No  . Drug Use: No  . Sexual Activity: Not on file   Other Topics Concern  . Not on file   Social History Narrative   Patient lives alone.   patient has 4 grandchildren.   Patient is retired   Patient has 2 years of college.   Patient is right handed.          REVIEW OF SYSTEMS: Constitutional: No fevers, chills, or sweats, no generalized fatigue, change in appetite Eyes: No visual changes, double vision, eye pain Ear, nose and throat: No hearing loss, ear pain, nasal congestion, sore throat Cardiovascular: No chest pain, palpitations Respiratory:  No shortness of breath at rest or with exertion, wheezes GastrointestinaI: No nausea, vomiting, diarrhea, abdominal pain, fecal incontinence Genitourinary:  No dysuria, urinary retention or frequency Musculoskeletal:  No neck pain, back pain Integumentary: No rash, pruritus, skin lesions Neurological: as above Psychiatric: No depression, insomnia, anxiety Endocrine: No palpitations, fatigue, diaphoresis, mood swings, change in appetite, change in  weight, increased thirst Hematologic/Lymphatic:  No anemia, purpura, petechiae. Allergic/Immunologic: no itchy/runny eyes, nasal congestion, recent allergic reactions, rashes  PHYSICAL EXAM: Filed Vitals:   02/09/14 1434  BP: 110/72  Pulse: 72  Resp: 16   General: No acute distress Head:  Normocephalic/atraumatic Neck: supple, no paraspinal tenderness, full range of motion Heart:  Regular rate and rhythm Lungs:  Clear to auscultation bilaterally Back: No paraspinal tenderness Skin/Extremities: No rash, no edema Neurological Exam: alert and oriented to person, place, and time. No aphasia or dysarthria. Fund of knowledge is appropriate.  Recent and remote memory are intact.  Attention and concentration are normal.    Able to name objects and repeat phrases. Cranial nerves: Pupils equal, round, reactive to light.  Fundoscopic exam unremarkable, no papilledema. Extraocular movements intact with no nystagmus. Visual fields full. Facial sensation intact. No facial asymmetry. Tongue, uvula, palate midline.  Motor: Bulk and tone normal, muscle strength 5/5 throughout with no pronator drift.  Sensation to light touch intact.  No extinction to double simultaneous stimulation.  Deep tendon reflexes 2+ throughout, toes downgoing.  Finger to nose testing intact.  Gait narrow-based and steady, able to tandem walk adequately.  Romberg negative.  IMPRESSION: This is a 74 yo RH woman with a history of episodes of loss of consciousness as a teenager, and  2 witnessed convulsions in 2008 and 2013. The last seizure occurred in the setting of reduced Trileptal dose and not taking lorazepam the night prior. 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 Continue Trileptal 300mg  BID. She is now off lorazepam and takes very low dose Xanax which does help some. She is not interested in trying any other sleep aid due to hungover feeling. We discussed  the importance of sleep in the setting of seizures, she will practice good sleep hygiene. She is aware of [place] driving laws, and she knows to stop driving after a seizure, until 6 months seizure-free. She will follow-up in 1 year and knows to call our office for any problems in the interim.  Thank you for allowing me to participate in her care.  Please do not hesitate to call for any questions or concerns.  The duration of this appointment visit was 15 minutes of face-to-face time with the patient.  Greater than 50% of this time was spent in counseling, explanation of diagnosis, planning of further management, and coordination of care.   Ellouise Newer, M.D.   CC: Dr. Ernie Hew

## 2014-02-17 ENCOUNTER — Ambulatory Visit (INDEPENDENT_AMBULATORY_CARE_PROVIDER_SITE_OTHER): Payer: Medicare Other | Admitting: Podiatry

## 2014-02-22 ENCOUNTER — Ambulatory Visit: Payer: Self-pay | Admitting: Podiatry

## 2014-03-01 ENCOUNTER — Ambulatory Visit: Payer: Self-pay | Admitting: Podiatry

## 2014-03-01 ENCOUNTER — Ambulatory Visit (INDEPENDENT_AMBULATORY_CARE_PROVIDER_SITE_OTHER): Payer: Medicare Other | Admitting: Podiatry

## 2014-03-01 ENCOUNTER — Encounter: Payer: Self-pay | Admitting: Podiatry

## 2014-03-01 VITALS — BP 129/71 | HR 71 | Resp 17

## 2014-03-01 DIAGNOSIS — L600 Ingrowing nail: Secondary | ICD-10-CM

## 2014-03-01 NOTE — Progress Notes (Signed)
   Subjective:    Patient ID: Joan Mclaughlin, female    DOB: 28-Apr-1940, 74 y.o.   MRN: [id number]  HPI Comments: N ingrown toenails L left 1, 2 toenails D and O long-term C painful toenails A enclosed shoes T none      Review of Systems  All other systems reviewed and are negative.      Objective:   Physical Exam Orientated 3 Mild incurvation about toenails 1-5 bilaterally There is no erythema, edema, drainage about the left hallux and second toenail       Assessment & Plan:   Assessment: Mildly incurvated toenails without a clinical sign of infection  Plan: Advised patient has so no clinical sign of infection. Patient requested I debrided toenails. I debrided toenails without a bleeding and advised patient that if the symptoms become persistent and consistent to return

## 2014-04-14 ENCOUNTER — Telehealth: Payer: Self-pay | Admitting: Neurology

## 2014-04-14 NOTE — Telephone Encounter (Signed)
Would use the Xanax for now until she sees the sleep specialist. Has she told her PCP as well about this? Thanks

## 2014-04-14 NOTE — Telephone Encounter (Signed)
Patient notified of advisement. 

## 2014-04-14 NOTE — Telephone Encounter (Signed)
She states that her pcp took her off of Lorazepam. She states you told her that you wouldn't want her taking it regularly in case she needs to take it as a rescue for seizure. She's tried Trazodone, Nortriptyline, Ambien they didn't work for her. She also has a Xanax 0.25 mg rx. She states she has seen a sleep doctor but she can't try his instructions yet because her grand kids are coming to visit, she states she won't be able to implement his instructions until 4/19. She is concerned because she knows lack of sleep can cause seizures. Please advise.

## 2014-04-14 NOTE — Telephone Encounter (Signed)
Pt states that she needs to talk to someone about medication that we took her off. She is now not sleeping all night and did not know if there is something she could take please call her at [phone]

## 2014-06-18 ENCOUNTER — Telehealth: Payer: Self-pay | Admitting: Neurology

## 2014-06-18 NOTE — Telephone Encounter (Signed)
Ok to try, let us know if any problems. Thanks

## 2014-06-18 NOTE — Telephone Encounter (Signed)
I spoke with patient her pcp is recommending Belsomra 10 mg to help her sleep. She wanted to know if this would be ok to take with her Trileptal. She hasn't filled the Rx yet she wanted to check with you 1st.

## 2014-06-18 NOTE — Telephone Encounter (Signed)
Lmovm to return my call. 

## 2014-06-18 NOTE — Telephone Encounter (Signed)
Pt called and said her GP was going to send a note about a new med she wants to try on the patient but needs to get Dr. Amparo Bristol input about how it may react with Trileptal. Please call patient # [phone] / Joan Mclaughlin.

## 2014-06-18 NOTE — Telephone Encounter (Signed)
Spoke with patient & notified of advisement. She will let us know if she has any issues.

## 2014-08-03 ENCOUNTER — Telehealth: Payer: Self-pay | Admitting: *Deleted

## 2014-08-03 NOTE — Telephone Encounter (Signed)
Left message on patient's vm notifying her of advisement. Asked her to return my call if she had any further questions.

## 2014-08-03 NOTE — Telephone Encounter (Signed)
She states that she is still having sleep issues. The Balsomra that she tried didn't help her either. She has tried several medications for sleep and none work. Her concern is her options for taking sleeping medication with the seizure medication. She wants to know if she could take lorazepam to help her get some sleep, she states to help her at least get 5-6 hours a night. Please advise.

## 2014-08-03 NOTE — Telephone Encounter (Signed)
Patient unable to sleep she would like to consider medication Call back number [phone]

## 2014-08-03 NOTE — Telephone Encounter (Signed)
Lmovm to return my call. 

## 2014-08-03 NOTE — Telephone Encounter (Signed)
If no other option, ok to restart lorazepam, but pls let her know we cannot prescribe this for her, has to be through PCP. Thanks

## 2014-11-07 ENCOUNTER — Encounter (HOSPITAL_COMMUNITY): Payer: Self-pay | Admitting: *Deleted

## 2014-11-07 ENCOUNTER — Emergency Department (HOSPITAL_COMMUNITY): Payer: Medicare Other

## 2014-11-07 ENCOUNTER — Inpatient Hospital Stay (HOSPITAL_COMMUNITY)
Admission: EM | Admit: 2014-11-07 | Discharge: 2014-11-11 | DRG: 392 | Disposition: A | Discharge status: Home / Self Care | Payer: Medicare Other | Attending: Internal Medicine | Admitting: Internal Medicine

## 2014-11-07 DIAGNOSIS — Z8261 Family history of arthritis: Secondary | ICD-10-CM | POA: Diagnosis not present

## 2014-11-07 DIAGNOSIS — K5792 Diverticulitis of intestine, part unspecified, without perforation or abscess without bleeding: Secondary | ICD-10-CM

## 2014-11-07 DIAGNOSIS — G40909 Epilepsy, unspecified, not intractable, without status epilepticus: Secondary | ICD-10-CM | POA: Diagnosis not present

## 2014-11-07 DIAGNOSIS — K5732 Diverticulitis of large intestine without perforation or abscess without bleeding: Principal | ICD-10-CM | POA: Diagnosis present

## 2014-11-07 DIAGNOSIS — K921 Melena: Secondary | ICD-10-CM | POA: Diagnosis present

## 2014-11-07 DIAGNOSIS — E871 Hypo-osmolality and hyponatremia: Secondary | ICD-10-CM | POA: Diagnosis not present

## 2014-11-07 DIAGNOSIS — G4700 Insomnia, unspecified: Secondary | ICD-10-CM | POA: Diagnosis present

## 2014-11-07 DIAGNOSIS — K8020 Calculus of gallbladder without cholecystitis without obstruction: Secondary | ICD-10-CM | POA: Diagnosis present

## 2014-11-07 DIAGNOSIS — E860 Dehydration: Secondary | ICD-10-CM | POA: Diagnosis present

## 2014-11-07 HISTORY — DX: Diverticulitis of intestine, part unspecified, without perforation or abscess without bleeding: K57.92

## 2014-11-07 LAB — COMPREHENSIVE METABOLIC PANEL
ALBUMIN: 3.7 g/dL (ref 3.5–5.0)
ALK PHOS: 126 U/L (ref 38–126)
ALT: 16 U/L (ref 14–54)
ANION GAP: 8 (ref 5–15)
AST: 21 U/L (ref 15–41)
BILIRUBIN TOTAL: 0.9 mg/dL (ref 0.3–1.2)
BUN: 10 mg/dL (ref 6–20)
CO2: 29 mmol/L (ref 22–32)
Calcium: 9 mg/dL (ref 8.9–10.3)
Chloride: 93 mmol/L — ABNORMAL LOW (ref 101–111)
Creatinine, Ser: 0.7 mg/dL (ref 0.44–1.00)
GFR calc Af Amer: >60 mL/min (ref >60)
GFR calc non Af Amer: >60 mL/min (ref >60)
GLUCOSE: 97 mg/dL (ref 65–99)
POTASSIUM: 3.9 mmol/L (ref 3.5–5.1)
SODIUM: 130 mmol/L — AB (ref 135–145)
TOTAL PROTEIN: 7.9 g/dL (ref 6.5–8.1)

## 2014-11-07 LAB — CBC
HEMATOCRIT: 40.6 % (ref 36.0–46.0)
HEMOGLOBIN: 13.9 g/dL (ref 12.0–15.0)
MCH: 30.5 pg (ref 26.0–34.0)
MCHC: 34.2 g/dL (ref 30.0–36.0)
MCV: 89 fL (ref 78.0–100.0)
Platelets: 254 10*3/uL (ref 150–400)
RBC: 4.56 MIL/uL (ref 3.87–5.11)
RDW: 12.9 % (ref 11.5–15.5)
WBC: 10.5 10*3/uL (ref 4.0–10.5)

## 2014-11-07 LAB — URINALYSIS, ROUTINE W REFLEX MICROSCOPIC
GLUCOSE, UA: NEGATIVE mg/dL
Ketones, ur: 40 mg/dL — AB
Nitrite: NEGATIVE
PH: 5.5 (ref 5.0–8.0)
Protein, ur: 100 mg/dL — AB
SPECIFIC GRAVITY, URINE: 1.024 (ref 1.005–1.030)
Urobilinogen, UA: 1 mg/dL (ref 0.0–1.0)

## 2014-11-07 LAB — URINE MICROSCOPIC-ADD ON

## 2014-11-07 MED ORDER — ACETAMINOPHEN 650 MG RE SUPP: 650.0000 mg | Freq: Four times a day (QID) | RECTAL | Status: DC | PRN

## 2014-11-07 MED ORDER — SODIUM CHLORIDE 0.9 % IV SOLN
INTRAVENOUS | Status: DC
  Administered 2014-11-07 – 2014-11-11 (×6): via INTRAVENOUS

## 2014-11-07 MED ORDER — PIPERACILLIN-TAZOBACTAM 3.375 G IVPB
3.3750 g | Freq: Three times a day (TID) | INTRAVENOUS | Status: DC
  Administered 2014-11-07 – 2014-11-08 (×3): 3.375 g via INTRAVENOUS
  Filled 2014-11-07 (×5): qty 50

## 2014-11-07 MED ORDER — ONDANSETRON HCL 4 MG/2ML IJ SOLN
4.0000 mg | Freq: Four times a day (QID) | INTRAMUSCULAR | Status: DC | PRN
  Administered 2014-11-08: 4 mg via INTRAVENOUS
  Filled 2014-11-07: qty 2

## 2014-11-07 MED ORDER — SODIUM CHLORIDE 0.9 % IV BOLUS (SEPSIS)
1000.0000 mL | Freq: Once | INTRAVENOUS | Status: AC
  Administered 2014-11-07: 1000 mL via INTRAVENOUS

## 2014-11-07 MED ORDER — IOHEXOL 300 MG/ML  SOLN
100.0000 mL | Freq: Once | INTRAMUSCULAR | Status: AC | PRN
  Administered 2014-11-07: 100 mL via INTRAVENOUS

## 2014-11-07 MED ORDER — MORPHINE SULFATE (PF) 2 MG/ML IV SOLN
1.0000 mg | INTRAVENOUS | Status: DC | PRN
  Administered 2014-11-07: 1 mg via INTRAVENOUS
  Filled 2014-11-07: qty 1

## 2014-11-07 MED ORDER — ONDANSETRON HCL 4 MG PO TABS
4.0000 mg | ORAL_TABLET | Freq: Four times a day (QID) | ORAL | Status: DC | PRN
  Administered 2014-11-08: 4 mg via ORAL
  Filled 2014-11-07 (×2): qty 1

## 2014-11-07 MED ORDER — ACETAMINOPHEN 325 MG PO TABS
650.0000 mg | ORAL_TABLET | Freq: Four times a day (QID) | ORAL | Status: DC | PRN
  Administered 2014-11-08: 650 mg via ORAL
  Filled 2014-11-07: qty 2

## 2014-11-07 MED ORDER — PIPERACILLIN-TAZOBACTAM 3.375 G IVPB 30 MIN
3.3750 g | Freq: Once | INTRAVENOUS | Status: AC
  Administered 2014-11-07: 3.375 g via INTRAVENOUS
  Filled 2014-11-07: qty 50

## 2014-11-07 MED ORDER — ZOLPIDEM TARTRATE 5 MG PO TABS
5.0000 mg | ORAL_TABLET | Freq: Every evening | ORAL | Status: DC | PRN
Start: 2014-11-07 — End: 2014-11-11
  Administered 2014-11-07 – 2014-11-10 (×4): 5 mg via ORAL
  Filled 2014-11-07 (×4): qty 1

## 2014-11-07 MED ORDER — OXCARBAZEPINE 300 MG PO TABS
300.0000 mg | ORAL_TABLET | Freq: Two times a day (BID) | ORAL | Status: DC
Start: 2014-11-07 — End: 2014-11-11
  Administered 2014-11-07 – 2014-11-11 (×8): 300 mg via ORAL
  Filled 2014-11-07 (×9): qty 1

## 2014-11-07 NOTE — ED Notes (Signed)
PA at bedside.

## 2014-11-07 NOTE — H&P (Signed)
[facility] History and Physical                                                                                    Joan Mclaughlin, is a 74 y.o. female  MRN: [id number]   DOB - 1940/08/03  Admit Date - 11/07/2014  Outpatient Primary MD for the patient is [name], MD  Referring MD: Mingo Amber / ER  Consulting MD: Hulen Skains / General surgery  With History of -  Past Medical History  Diagnosis Date  . Seizures ([place])   . Anxiety   . Allergy   . Diverticulitis       Past Surgical History  Procedure Laterality Date  . Back surgery  07/2007  . Tubal ligation    . Mouth surgery      in for   Chief Complaint  Patient presents with  . Abdominal Cramping     HPI This is a 74 yo female patient history of seizure disorder seizure free since 2013 (diagnosed 2008), chronic insomnia, known diverticula diagnosed on colonoscopy "several years ago" who presents with abdominal pain and blood in stool. Patient reports that this past Friday she began having left lower quadrant abdominal pain and went to see her primary care physician who felt she had diverticulitis and started her empirically on Cipro and Flagyl. Abdominal pain increased on Friday but seemed improved on Saturday. By today she had noticed mucoid stools with blood and presented to the ER. Prior to arrival she had 2 stools with dark red blood and mucus that were small volume. She is not having any fevers chills or nausea and vomiting. In addition to the left lower quadrant discomfort she was also having a sensation of suprapubic fullness.  In the ER she was afebrile, BP 110/58, pulse regular 83 respirations 16 room air sats 98%. Sodium was low at 130 as was chloride 93, otherwise electronic panel unremarkable, CBC unremarkable. Urinalysis appeared consistent with dehydration. Urine culture was obtained by the ER. CT of the abdomen and pelvis with contrast revealed acute sigmoid colon diverticulitis with associated bowel wall  thickening and pericolic inflammation and fluid stranding. It was also confluent area of edema/phlegmon along the undersurface of the involved segment of sigmoid colon that measures 3 cm at greatest dimension but no evidence of abscess formation and no free air. Follow-up CT was recommended by the radiologist.   Review of Systems   In addition to the HPI above,  No Fever-chills, myalgias or other constitutional symptoms No Headache, changes with Vision or hearing, new weakness, tingling, numbness in any extremity, No problems swallowing food or Liquids, indigestion/reflux No Chest pain, Cough or Shortness of Breath, palpitations, orthopnea or DOE No N/V No dysuria, hematuria or flank pain No new skin rashes, lesions, masses or bruises, No new joints pains-aches No recent weight gain or loss No polyuria, polydypsia or polyphagia,  *A full 10 point Review of Systems was done, except as stated above, all other Review of Systems were negative.  Social History Social History  Substance Use Topics  . Smoking status: Never Smoker   . Smokeless tobacco: Never Used  . Alcohol Use: No  Resides at: Private residence  Lives with: Alone  Ambulatory status: Without assistive devices   Family History Family History  Problem Relation Age of Onset  . Arthritis Diverticulosis  Father Sister       Prior to Admission medications   Medication Sig Start Date End Date Taking? Authorizing Provider  ALPRAZolam Duanne Moron) 0.25 MG tablet Take 0.25 mg by mouth at bedtime as needed for anxiety.   Yes Historical Provider, MD  Biotin 10 MG CAPS Take 10 mg by mouth daily.   Yes Historical Provider, MD  ciprofloxacin (CIPRO) 500 MG tablet Take 500 mg by mouth 2 (two) times daily. Started on 11-05-14 for 7 days   Yes Historical Provider, MD  fexofenadine (ALLEGRA) 180 MG tablet Take 180 mg by mouth daily.   Yes Historical Provider, MD  metroNIDAZOLE (FLAGYL) 500 MG tablet Take 500 mg by mouth 3 (three)  times daily.   Yes Historical Provider, MD  Multiple Vitamins-Minerals (MULTIVITAMIN WITH MINERALS) tablet Take 1 tablet by mouth daily.   Yes Historical Provider, MD  Oxcarbazepine (TRILEPTAL) 300 MG tablet Take 1 tablet (300 mg total) by mouth 2 (two) times daily. 02/09/14  Yes Cameron Sprang, MD  valACYclovir (VALTREX) 500 MG tablet Take 500 mg by mouth as needed.  10/09/12  Yes Historical Provider, MD  zolpidem (AMBIEN) 10 MG tablet Take 10 mg by mouth at bedtime as needed for sleep.   Yes Historical Provider, MD    No Known Allergies  Physical Exam  Vitals  Blood pressure 162/57, pulse 77, temperature 97.6 F (36.4 C), temperature source Oral, resp. rate 16, height 5\' 3"  (1.6 m), weight 136 lb 14.4 oz (62.097 kg), SpO2 97 %.   General:  In no acute distress, appears healthy and stated age  Psych:  Normal affect, Denies Suicidal or Homicidal ideations, Awake Alert, Oriented X 3. Speech and thought patterns are clear and appropriate, no apparent short term memory deficits  Neuro:   No focal neurological deficits, CN II through XII intact, Strength 5/5 all 4 extremities, Sensation intact all 4 extremities.  ENT:  Ears and Eyes appear Normal, Conjunctivae clear, PER. Dry oral mucosa without erythema or exudates.  Neck:  Supple, No lymphadenopathy appreciated  Respiratory:  Symmetrical chest wall movement, Good air movement bilaterally, CTAB. Room Air  Cardiac:  RRR, No Murmurs, no LE edema noted, no JVD, No carotid bruits, peripheral pulses palpable at 2+  Abdomen:  Hyperactive bowel sounds, Soft, focally tender left lower quadrant adjacent to suprapubic region with guarding but no rebounding, Non distended,  No masses appreciated, no obvious hepatosplenomegaly, FOB positive in the ER  Skin:  No Cyanosis, Normal Skin Turgor, No Skin Rash or Bruise.  Extremities: Symmetrical without obvious trauma or injury,  no effusions.  Data Review  CBC  Recent Labs Lab 11/07/14 1252  WBC  10.5  HGB 13.9  HCT 40.6  PLT 254  MCV 89.0  MCH 30.5  MCHC 34.2  RDW 12.9    Chemistries   Recent Labs Lab 11/07/14 1252  NA 130*  K 3.9  CL 93*  CO2 29  GLUCOSE 97  BUN 10  CREATININE 0.70  CALCIUM 9.0  AST 21  ALT 16  ALKPHOS 126  BILITOT 0.9    estimated creatinine clearance is 49.5 mL/min (by C-G formula based on Cr of 0.7).  No results for input(s): TSH, T4TOTAL, T3FREE, THYROIDAB in the last 72 hours.  Invalid input(s): FREET3  Coagulation profile No results for input(s): INR,  PROTIME in the last 168 hours.  No results for input(s): DDIMER in the last 72 hours.  Cardiac Enzymes No results for input(s): CKMB, TROPONINI, MYOGLOBIN in the last 168 hours.  Invalid input(s): CK  Invalid input(s): POCBNP  Urinalysis    Component Value Date/Time   COLORURINE AMBER* 11/07/2014 1241   APPEARANCEUR TURBID* 11/07/2014 1241   LABSPEC 1.024 11/07/2014 1241   PHURINE 5.5 11/07/2014 1241   GLUCOSEU NEGATIVE 11/07/2014 1241   HGBUR MODERATE* 11/07/2014 1241   BILIRUBINUR MODERATE* 11/07/2014 1241   KETONESUR 40* 11/07/2014 1241   PROTEINUR 100* 11/07/2014 1241   UROBILINOGEN 1.0 11/07/2014 1241   NITRITE NEGATIVE 11/07/2014 1241   LEUKOCYTESUR TRACE* 11/07/2014 1241    Imaging results:   Ct Abdomen Pelvis W Contrast  11/07/2014  CLINICAL DATA:  Lower abdominal pain starting Friday night, rectal bleeding starting today. EXAM: CT ABDOMEN AND PELVIS WITH CONTRAST TECHNIQUE: Multidetector CT imaging of the abdomen and pelvis was performed using the standard protocol following bolus administration of intravenous contrast. CONTRAST:  134mL OMNIPAQUE IOHEXOL 300 MG/ML  SOLN COMPARISON:  None. FINDINGS: Lower chest:  No acute findings. Hepatobiliary: Large stone within the fundus of the gallbladder. No gallbladder wall thickening, pericholecystic fluid or other secondary signs of acute cholecystitis. No bile duct dilatation. Liver appears normal. Pancreas: No mass,  inflammatory changes, or other significant abnormality. Spleen: Within normal limits in size and appearance. Adrenals/Urinary Tract: No masses identified. No evidence of hydronephrosis. Stomach/Bowel: There is thickening of the walls of the lower sigmoid colon. Given the scattered diverticulosis within the sigmoid colon, findings likely represent acute diverticulitis. Confluent edema/phlegmon noted along the inferior margin of the involved segment of sigmoid colon without definite abscess. No associated bowel obstruction. Appendix is normal. Vascular/Lymphatic: Atherosclerotic changes of the normal-caliber abdominal aorta. No aortic aneurysm or dissection. Reproductive: Unremarkable. Other: No free intraperitoneal air. Musculoskeletal: Scattered degenerative changes within the thoracolumbar spine but no acute osseous abnormality. 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 IMPRESSION: 1. Acute sigmoid colon diverticulitis with associated bowel wall thickening and pericolic inflammation/fluid stranding. Confluent area of edema/phlegmon along the undersurface of the involved segment of the sigmoid colon measures approximately 3 cm greatest dimension, without evidence of abscess formation at this time, but follow-up CT is recommended to exclude early developing abscess. Would also recommend follow-up CT at some point to ensure resolution of today's findings and exclude the less likely possibility of this representing a neoplastic bowel wall thickening. 2. No evidence of bowel perforation seen. No free intraperitoneal air. 3. No bowel obstruction. 4. Cholelithiasis without evidence of acute cholecystitis. These results and recommendation were called by telephone at the time of interpretation on 11/07/2014 at 3:21 pm to Dr. Waynetta Pean , who verbally acknowledged these results. Electronically Signed   By: Franki Cabot M.D.   On: 11/07/2014 15:22     Assessment &  Plan  Principal Problem:   Acute diverticulitis with phlegmon/Blood in stool -Admit to medical floor -Consult general surgery -Currently no fever or leukocytosis but patient was on Cipro and Flagyl prior to admission and this is likely blunting physiologic response; if continued pain for 5 days after admission consider repeating CT of the abdomen to determine if phlegmon has evolved into abscess -NPO except for ice chips, bowel rest and IV fluids; since no obstructive pattern will allow necessary meds with a sip of water -Empiric Zosyn with pharmacy dosing -IV morphine for pain -Consider allowing clear liquids pending surgical evaluation -  Once medically stable, in about 6 weeks, patient will need to be referred to a primary gastroenterologist Dr. Oletta Lamas for colonoscopy -Avoid NSAIDs despite inflammatory changes in setting of blood in stool  Active Problems:   Dehydration with hyponatremia -Normal saline IV fluids -Electrolyte panel in a.m.    Seizure disorder ([place]) -On Trileptal prior to admission -No seizure since 2013 -Allow home AED with sips of water      Insomnia -Allow prn Ambien with sips of water    Cholelithiasis -Incidental finding on CT scan -Currently asymptomatic    DVT Prophylaxis:  SCDs since experiencing blood in stool  Family Communication:   Sister at bedside  Code Status:  Full code    Condition:  Stable  Discharge disposition:  Anticipate discharge back to home once medically stable  Time spent in minutes : 60      ELLIS,ALLISON L. ANP on 11/07/2014 at 4:27 PM  Between 7am to 7pm - Pager - [phone]  After 7pm go to [email or web address] - password [email or web address]  And look for the night coverage person covering me after hours  [facility]

## 2014-11-07 NOTE — ED Notes (Signed)
Pt returned from CT °

## 2014-11-07 NOTE — Progress Notes (Signed)
11/07/14 Report from emergency room from [place]. Joan Mclaughlin 29yr with Dx of Acute Diverticulitis, has rectal bleeding, IV site in right A/C with normal saline at 100 hr,NPO except ice chips, up ad lib,vital signs routine,VTE SCD,s, and has history of Seizures,Anxiety,Allergy,Diverticulitis,and Chronic Insomnia.

## 2014-11-07 NOTE — Progress Notes (Signed)
The patient refused her SCDs. 

## 2014-11-07 NOTE — ED Notes (Signed)
Attempted report 

## 2014-11-07 NOTE — ED Notes (Signed)
Saw physician on Friday for lower abdominal pain and has history of diverticulitis.  Pt was told she had blood in her urine and was started on Cipro.  Pt was then prescribed Flagyl when she started having some mucous with red ting in coming from her rectum.  Then started having rectal bleeding this am. PT still has some crampy feeling when she feels like she is going to have a bowel movement.

## 2014-11-07 NOTE — ED Notes (Signed)
HEMOCULT POSITIVE

## 2014-11-07 NOTE — ED Provider Notes (Signed)
CSN: [id number]     Arrival date & time 11/07/14  1106 History   First MD Initiated Contact with Patient 11/07/14 1236     Chief Complaint  Patient presents with  . Abdominal Cramping   Joan Mclaughlin is a 74 y.o. female with history of seizures, anxiety and diverticulitis who presents to the emergency department complaining of left lower abdominal pain that has been intermittent as well as blood in her stool for the past 3 days. Patient reports that she's been having a decreased appetite recently and noticed some mucus and blood tinged stool 3 days ago. She reports she was seen by her primary care doctor on Friday and was started on Cipro for a possible urinary tract infection. She reports they found trace blood in her urine. She reports she's been having increased burping and flatulence over the past several months. She reports today she began having left lower quadrant abdominal pain which is intermittent. She denies current pain. She reports she had 2 stools with dark red blood and mucus that were small volume. She reports she spoke with her primary care doctor on call who also started her on Flagyl with the Cipro. Patient also reports some urinary urgency over the past week. Her only abdominal surgery includes a tubal ligation. The patient denies fevers, chills, rashes, vaginal bleeding, vaginal discharge, hematuria, dysuria, urinary frequency, nausea, vomiting, shortness of breath or syncope.  (Consider location/radiation/quality/duration/timing/severity/associated sxs/prior Treatment) HPI  Past Medical History  Diagnosis Date  . Seizures ([place])   . Anxiety   . Allergy   . Diverticulitis    Past Surgical History  Procedure Laterality Date  . Back surgery  07/2007  . Tubal ligation    . Mouth surgery     Family History  Problem Relation Age of Onset  . Arthritis Father    Social History  Substance Use Topics  . Smoking status: Never Smoker   . Smokeless tobacco: Never Used  .  Alcohol Use: No   OB History    No data available     Review of Systems  Constitutional: Positive for appetite change. Negative for fever and chills.  HENT: Negative for congestion and sore throat.   Eyes: Negative for visual disturbance.  Respiratory: Negative for cough and shortness of breath.   Cardiovascular: Negative for chest pain.  Gastrointestinal: Positive for abdominal pain, diarrhea and blood in stool. Negative for nausea, vomiting, abdominal distention and rectal pain.  Genitourinary: Positive for urgency. Negative for dysuria, frequency, hematuria, flank pain, decreased urine volume, vaginal bleeding, vaginal discharge, difficulty urinating, vaginal pain and pelvic pain.  Musculoskeletal: Negative for back pain and neck pain.  Skin: Negative for rash.  Neurological: Negative for syncope, light-headedness and headaches.      Allergies  Review of patient's allergies indicates no known allergies.  Home Medications   Prior to Admission medications   Medication Sig Start Date End Date Taking? Authorizing Provider  ALPRAZolam Duanne Moron) 0.25 MG tablet Take 0.25 mg by mouth at bedtime as needed for anxiety.   Yes Historical Provider, MD  Biotin 10 MG CAPS Take 10 mg by mouth daily.   Yes Historical Provider, MD  ciprofloxacin (CIPRO) 500 MG tablet Take 500 mg by mouth 2 (two) times daily. Started on 11-05-14 for 7 days   Yes Historical Provider, MD  fexofenadine (ALLEGRA) 180 MG tablet Take 180 mg by mouth daily.   Yes Historical Provider, MD  metroNIDAZOLE (FLAGYL) 500 MG tablet Take 500 mg by mouth  3 (three) times daily.   Yes Historical Provider, MD  Multiple Vitamins-Minerals (MULTIVITAMIN WITH MINERALS) tablet Take 1 tablet by mouth daily.   Yes Historical Provider, MD  Oxcarbazepine (TRILEPTAL) 300 MG tablet Take 1 tablet (300 mg total) by mouth 2 (two) times daily. 02/09/14  Yes Cameron Sprang, MD  valACYclovir (VALTREX) 500 MG tablet Take 500 mg by mouth as needed.   10/09/12  Yes Historical Provider, MD  zolpidem (AMBIEN) 10 MG tablet Take 10 mg by mouth at bedtime as needed for sleep.   Yes Historical Provider, MD   BP 134/60 mmHg  Pulse 77  Temp(Src) 98.3 F (36.8 C) (Oral)  Resp 19  Ht 5\' 3"  (1.6 m)  Wt 136 lb 14.4 oz (62.098 kg)  BMI 24.26 kg/m2  SpO2 99% Physical Exam  Constitutional: She appears well-developed and well-nourished. No distress.  Nontoxic appearing.  HENT:  Head: Normocephalic and atraumatic.  Mouth/Throat: Oropharynx is clear and moist.  Eyes: Conjunctivae are normal. Pupils are equal, round, and reactive to light. Right eye exhibits no discharge. Left eye exhibits no discharge.  Neck: Neck supple.  Cardiovascular: Normal rate, regular rhythm, normal heart sounds and intact distal pulses.  Exam reveals no gallop and no friction rub.   No murmur heard. Pulmonary/Chest: Effort normal and breath sounds normal. No respiratory distress. She has no wheezes. She has no rales.  Abdominal: Soft. Bowel sounds are normal. She exhibits no distension and no mass. There is tenderness. There is no rebound and no guarding.  Abdomen is soft. Bowel sounds are present. Patient has very mild left lower quadrant abdominal tenderness/ "discomfort" to palpation. No psoas or obturator sign.  Genitourinary: Guaiac positive stool.  Digital rectal exam performed by me with female RN chaperone. No gross bloody stool. Anal sphincter tone was normal.  Hemoccult was positive.  Musculoskeletal: She exhibits no edema or tenderness.  No lower extremity edema or tenderness.  Lymphadenopathy:    She has no cervical adenopathy.  Neurological: She is alert. Coordination normal.  Skin: Skin is warm and dry. No rash noted. She is not diaphoretic. No erythema. No pallor.  Psychiatric: She has a normal mood and affect. Her behavior is normal.  Nursing note and vitals reviewed.   ED Course  Procedures (including critical care time) Labs Review Labs Reviewed   COMPREHENSIVE METABOLIC PANEL - Abnormal; Notable for the following:    Sodium 130 (*)    Chloride 93 (*)    All other components within normal limits  URINALYSIS, ROUTINE W REFLEX MICROSCOPIC (NOT AT [facility]) - Abnormal; Notable for the following:    Color, Urine AMBER (*)    APPearance TURBID (*)    Hgb urine dipstick MODERATE (*)    Bilirubin Urine MODERATE (*)    Ketones, ur 40 (*)    Protein, ur 100 (*)    Leukocytes, UA TRACE (*)    All other components within normal limits  URINE MICROSCOPIC-ADD ON - Abnormal; Notable for the following:    Bacteria, UA FEW (*)    All other components within normal limits  URINE CULTURE  CBC  OCCULT BLOOD X 1 CARD TO LAB, STOOL  BASIC METABOLIC PANEL  CBC    Imaging Review Ct Abdomen Pelvis W Contrast  11/07/2014  CLINICAL DATA:  Lower abdominal pain starting Friday night, rectal bleeding starting today. EXAM: CT ABDOMEN AND PELVIS WITH CONTRAST TECHNIQUE: Multidetector CT imaging of the abdomen and pelvis was performed using the standard protocol following bolus administration  of intravenous contrast. CONTRAST:  140mL OMNIPAQUE IOHEXOL 300 MG/ML  SOLN COMPARISON:  None. FINDINGS: Lower chest:  No acute findings. Hepatobiliary: Large stone within the fundus of the gallbladder. No gallbladder wall thickening, pericholecystic fluid or other secondary signs of acute cholecystitis. No bile duct dilatation. Liver appears normal. Pancreas: No mass, inflammatory changes, or other significant abnormality. Spleen: Within normal limits in size and appearance. Adrenals/Urinary Tract: No masses identified. No evidence of hydronephrosis. Stomach/Bowel: There is thickening of the walls of the lower sigmoid colon. Given the scattered diverticulosis within the sigmoid colon, findings likely represent acute diverticulitis. Confluent edema/phlegmon noted along the inferior margin of the involved segment of sigmoid colon without definite abscess. No associated bowel  obstruction. Appendix is normal. Vascular/Lymphatic: Atherosclerotic changes of the normal-caliber abdominal aorta. No aortic aneurysm or dissection. Reproductive: Unremarkable. Other: No free intraperitoneal air. Musculoskeletal: Scattered degenerative changes within the thoracolumbar spine but no acute osseous abnormality. 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 IMPRESSION: 1. Acute sigmoid colon diverticulitis with associated bowel wall thickening and pericolic inflammation/fluid stranding. Confluent area of edema/phlegmon along the undersurface of the involved segment of the sigmoid colon measures approximately 3 cm greatest dimension, without evidence of abscess formation at this time, but follow-up CT is recommended to exclude early developing abscess. Would also recommend follow-up CT at some point to ensure resolution of today's findings and exclude the less likely possibility of this representing a neoplastic bowel wall thickening. 2. No evidence of bowel perforation seen. No free intraperitoneal air. 3. No bowel obstruction. 4. Cholelithiasis without evidence of acute cholecystitis. These results and recommendation were called by telephone at the time of interpretation on 11/07/2014 at 3:21 pm to Dr. Waynetta Pean , who verbally acknowledged these results. Electronically Signed   By: Franki Cabot M.D.   On: 11/07/2014 15:22   I have personally reviewed and evaluated these images and lab results as part of my medical decision-making.   EKG Interpretation None      Filed Vitals:   11/07/14 1530 11/07/14 1604 11/07/14 1654 11/07/14 1657  BP: 116/55 162/57 134/60   Pulse: 72 77 77   Temp:   98.3 F (36.8 C)   TempSrc:   Oral   Resp: 16 16 19    Height:    5\' 3"  (1.6 m)  Weight:    136 lb 14.4 oz (62.098 kg)  SpO2: 96% 97% 99%      MDM   Meds given in ED:  Medications  0.9 %  sodium chloride infusion ( Intravenous Transfusing/Transfer  11/07/14 1638)  morphine 2 MG/ML injection 1 mg (not administered)  acetaminophen (TYLENOL) tablet 650 mg (not administered)    Or  acetaminophen (TYLENOL) suppository 650 mg (not administered)  zolpidem (AMBIEN) tablet 5 mg (not administered)  ondansetron (ZOFRAN) tablet 4 mg (not administered)    Or  ondansetron (ZOFRAN) injection 4 mg (not administered)  Oxcarbazepine (TRILEPTAL) tablet 300 mg (not administered)  piperacillin-tazobactam (ZOSYN) IVPB 3.375 g (not administered)  sodium chloride 0.9 % bolus 1,000 mL (0 mLs Intravenous Stopped 11/07/14 1430)  iohexol (OMNIPAQUE) 300 MG/ML solution 100 mL (100 mLs Intravenous Contrast Given 11/07/14 1441)  piperacillin-tazobactam (ZOSYN) IVPB 3.375 g (3.375 g Intravenous Transfusing/Transfer 11/07/14 1638)    Current Discharge Medication List      Final diagnoses:  Acute diverticulitis   This is a 74 y.o. female with history of seizures, anxiety and diverticulitis who presents to the emergency department complaining of left lower  abdominal pain that has been intermittent as well as blood in her stool for the past 3 days. Patient reports that she's been having a decreased appetite recently and noticed some mucus and blood tinged stool 3 days ago. She reports she was seen by her primary care doctor on Friday and was started on Cipro for a possible urinary tract infection. She reports they found trace blood in her urine. She reports she's been having increased burping and flatulence over the past several months. She reports today she began having left lower quadrant abdominal pain which is intermittent. She denies current pain. She reports she had 2 stools with dark red blood and mucus that were small volume.  On exam the patient is afebrile nontoxic appearing. Her abdomen is soft and she has mild left lower quadrant abdominal tenderness to palpation. She has no gross bloody stool on digital rectal exam. Hemoccult was positive.  Urinalysis shows  moderate hemoglobin with trace leukocytes. Urine sent for culture. CBC shows no leukocytosis and is within normal limits. CMP is remarkable only for a sodium of 1:30 and a chloride of 93. Kidney function is maintained. CT abdomen and pelvis with contrast indicates acute sigmoid colon diverticulitis with associated bowel wall thickening and peri-colic inflammation/fluid stranding. There is a confluent area of edema/phlegmon along the undersurface of the involved segment. Follow-up CT was recommended by radiology. We'll start the patient on Zosyn and admit to medicine. The patient is in agreement with admission. I consulted with Jerral Ralph, NP who accepted the patient for admission with attending Dr. Rowe Pavy and requested med surg temporary admission orders.   This patient was discussed with and evaluated by Dr. Mingo Amber who agrees with assessment and plan.  Waynetta Pean, PA-C 11/07/14 1714  Evelina Bucy, MD 11/08/14 [phone]

## 2014-11-07 NOTE — Progress Notes (Signed)
ANTIBIOTIC CONSULT NOTE - INITIAL  Pharmacy Consult for zosyn  Indication: intra-abdominal infection  No Known Allergies  Patient Measurements: Height: 5\' 3"  (160 cm) Weight: 136 lb 14.4 oz (62.097 kg) IBW/kg (Calculated) : 52.4   Vital Signs: Temp: 97.6 F (36.4 C) (10/30 1128) Temp Source: Oral (10/30 1128) BP: 162/57 mmHg (10/30 1604) Pulse Rate: 77 (10/30 1604) Intake/Output from previous day:   Intake/Output from this shift: Total I/O In: 1000 [I.V.:1000] Out: -   Labs:  Recent Labs  11/07/14 1252  WBC 10.5  HGB 13.9  PLT 254  CREATININE 0.70   Estimated Creatinine Clearance: 49.5 mL/min (by C-G formula based on Cr of 0.7). No results for input(s): VANCOTROUGH, VANCOPEAK, VANCORANDOM, GENTTROUGH, GENTPEAK, GENTRANDOM, TOBRATROUGH, TOBRAPEAK, TOBRARND, AMIKACINPEAK, AMIKACINTROU, AMIKACIN in the last 72 hours.   Microbiology: No results found for this or any previous visit (from the past 720 hour(s)).  Medical History: Past Medical History  Diagnosis Date  . Seizures ([place])   . Anxiety   . Allergy   . Diverticulitis    Assessment: 74 yo female patient history of seizure disorder seizure free since 2013 (diagnosed 2008), chronic insomnia, known diverticula diagnosed on colonoscopy "several years ago" who presents with abdominal pain and blood in stool.  New orders received to continue zosyn for possible diverticulitis,no fever or leukocytosis but patient was on Cipro and Flagyl prior to admission and this is likely blunting physiologic response. Renal function ok for extended interval dosing.  Goal of Therapy:  Eradication of infection  Plan:  Zosyn 3.375g IV q8 hours Follow urine cx  Erin Hearing PharmD., BCPS Clinical Pharmacist Pager [phone] 11/07/2014 4:56 PM

## 2014-11-08 LAB — BASIC METABOLIC PANEL
ANION GAP: 16 — AB (ref 5–15)
Anion gap: 9 (ref 5–15)
BUN: 8 mg/dL (ref 6–20)
BUN: 9 mg/dL (ref 6–20)
CALCIUM: 8.9 mg/dL (ref 8.9–10.3)
CHLORIDE: 105 mmol/L (ref 101–111)
CO2: 25 mmol/L (ref 22–32)
CO2: 21 mmol/L — ABNORMAL LOW (ref 22–32)
CREATININE: 0.64 mg/dL (ref 0.44–1.00)
Calcium: 8.9 mg/dL (ref 8.9–10.3)
Chloride: 101 mmol/L (ref 101–111)
Creatinine, Ser: 0.67 mg/dL (ref 0.44–1.00)
GLUCOSE: 62 mg/dL — AB (ref 65–99)
Glucose, Bld: 74 mg/dL (ref 65–99)
POTASSIUM: 4.1 mmol/L (ref 3.5–5.1)
POTASSIUM: 3.6 mmol/L (ref 3.5–5.1)
SODIUM: 139 mmol/L (ref 135–145)
Sodium: 138 mmol/L (ref 135–145)

## 2014-11-08 LAB — CBC
HCT: 37.4 % (ref 36.0–46.0)
Hemoglobin: 12.4 g/dL (ref 12.0–15.0)
MCH: 29.7 pg (ref 26.0–34.0)
MCHC: 33.2 g/dL (ref 30.0–36.0)
MCV: 89.7 fL (ref 78.0–100.0)
PLATELETS: 221 10*3/uL (ref 150–400)
RBC: 4.17 MIL/uL (ref 3.87–5.11)
RDW: 13.2 % (ref 11.5–15.5)
WBC: 7.2 10*3/uL (ref 4.0–10.5)

## 2014-11-08 LAB — URINE CULTURE: Culture: NO GROWTH

## 2014-11-08 MED ORDER — PIPERACILLIN-TAZOBACTAM 3.375 G IVPB
3.3750 g | Freq: Three times a day (TID) | INTRAVENOUS | Status: DC
Start: 2014-11-09 — End: 2014-11-11
  Administered 2014-11-09 – 2014-11-11 (×7): 3.375 g via INTRAVENOUS
  Filled 2014-11-08 (×8): qty 50

## 2014-11-08 MED ORDER — WITCH HAZEL-GLYCERIN EX PADS
MEDICATED_PAD | CUTANEOUS | Status: DC | PRN
  Administered 2014-11-09: 12:00:00 via TOPICAL
  Filled 2014-11-08 (×3): qty 100

## 2014-11-08 MED ORDER — ENOXAPARIN SODIUM 40 MG/0.4ML ~~LOC~~ SOLN
40.0000 mg | SUBCUTANEOUS | Status: DC
  Administered 2014-11-08 – 2014-11-10 (×3): 40 mg via SUBCUTANEOUS
  Filled 2014-11-08 (×3): qty 0.4

## 2014-11-08 MED ORDER — MAGNESIUM SULFATE GRAN
GRANULES | Status: DC | PRN
  Filled 2014-11-08: qty 454

## 2014-11-08 NOTE — Progress Notes (Signed)
ANTICOAGULATION + ANTIBIOTIC CONSULT NOTE - Follow Up Consult  Pharmacy Consult for Lovenox and Zosyn Indication: VTE prophylaxis and intra-abdominal coverage  No Known Allergies  Patient Measurements: Height: 5\' 3"  (160 cm) Weight: 136 lb 14.4 oz (62.098 kg) IBW/kg (Calculated) : 52.4  Vital Signs: Temp: 97.5 F (36.4 C) (10/31 0537) Temp Source: Oral (10/31 0537) BP: 138/50 mmHg (10/31 0537) Pulse Rate: 72 (10/31 0537)  Labs:  Recent Labs  11/07/14 1252 11/08/14 0439 11/08/14 1154  HGB 13.9 12.4  --   HCT 40.6 37.4  --   PLT 254 221  --   CREATININE 0.70 0.64 0.67    Estimated Creatinine Clearance: 49.5 mL/min (by C-G formula based on Cr of 0.67).  Assessment:  Refused SCDs.  To begin Lovenox for VTE prophylaxis.  Noted blood in stool on admit.  CBC ok.  Bowel rest for diverticulitis. External hemorrhoids noted.   Day # 2 Zosyn for intra-abdominal coverage    Goal of Therapy:  appropriate Lovenox dose for VTE prophylaxis  Monitor platelets by anticoagulation protocol: Yes  Appropriate Zosyn dose for renal function and infection   Plan:   Lovenox 40 mg sq q24hrs.  Intermittent CBC.  Next on 11/10/14.  Follow up for further blood is stool.  Continue Zosyn 3.375 gm IV q8hrs (each over 4 hrs).  Arty Baumgartner, [place] Pager: [phone] 11/08/2014,1:47 PM

## 2014-11-08 NOTE — Progress Notes (Signed)
[facility] PROGRESS NOTE  Joan Mclaughlin YTK:[id number] DOB: October 02, 1940 DOA: 11/07/2014 PCP: [name], MD  Length of stay: 1   Assessment/Plan: Principal Problem:   Acute diverticulitis Active Problems:   Seizure disorder ([place])   Insomnia   Dehydration with hyponatremia   Blood in stool   Cholelithiasis      Acute diverticulitis with phlegmon/Blood in stool -Consult general surgery -Currently no fever or leukocytosis but patient was on Cipro and Flagyl prior to admission , if continued pain for 5 days after admission consider repeating CT of the abdomen to determine if phlegmon has evolved into abscess -NPO except for ice chips,for now   bowel rest and IV fluids; since no obstructive pattern will allow necessary meds with a sip of water -Empiric Zosyn with pharmacy dosing -IV morphine for pain  surgical evaluation -Once medically stable, in about 6 weeks, patient will need to be referred to a primary gastroenterologist Dr. Oletta Lamas for colonoscopy -Avoid NSAIDs despite inflammatory changes in setting of blood in stool     Dehydration with hyponatremia -Normal saline IV fluids -Electrolyte panel in a.m.   Seizure disorder ([place]) -On Trileptal prior to admission, seizure free per pt  -No seizure since 2013 -Allow home AED with sips of water    Insomnia -Allow prn Ambien with sips of water   Cholelithiasis -Incidental finding on CT scan -Currently asymptomatic  DVT prophylaxsis SCDs  Code Status:      Code Status Orders        Start     Ordered   11/07/14 1630  Full code   Continuous     11/07/14 1629    Advance Directive Documentation        Most Recent Value   Type of Advance Directive  Living will   Pre-existing out of facility DNR order (yellow form or pink MOST form)     "MOST" Form in Place?       Family Communication: family updated about patient's clinical progress Disposition Plan:  2-3 days    Brief  narrative: 74 yo female patient history of seizure disorder seizure free since 2013 (diagnosed 2008), chronic insomnia, known diverticula diagnosed on colonoscopy "several years ago" who presents with abdominal pain and blood in stool. Patient reports that this past Friday she began having left lower quadrant abdominal pain and went to see her primary care physician who felt she had diverticulitis and started her empirically on Cipro and Flagyl. Abdominal pain increased on Friday but seemed improved on Saturday. By today she had noticed mucoid stools with blood and presented to the ER. Prior to arrival she had 2 stools with dark red blood and mucus that were small volume. She is not having any fevers chills or nausea and vomiting. In addition to the left lower quadrant discomfort she was also having a sensation of suprapubic fullness.  In the ER she was afebrile, BP 110/58, pulse regular 83 respirations 16 room air sats 98%. Sodium was low at 130 as was chloride 93, otherwise electronic panel unremarkable, CBC unremarkable. Urinalysis appeared consistent with dehydration. Urine culture was obtained by the ER. CT of the abdomen and pelvis with contrast revealed acute sigmoid colon diverticulitis with associated bowel wall thickening and pericolic inflammation and fluid stranding. It was also confluent area of edema/phlegmon along the undersurface of the involved segment of sigmoid colon that measures 3 cm at greatest dimension but no evidence of abscess formation and no free air. Follow-up CT  was recommended by the radiologist  Consultants:  CCS  Procedures:  None  Antibiotics: Anti-infectives    Start     Dose/Rate Route Frequency Ordered Stop   11/07/14 2200  piperacillin-tazobactam (ZOSYN) IVPB 3.375 g     3.375 g 12.5 mL/hr over 240 Minutes Intravenous 3 times per day 11/07/14 1651     11/07/14 1545  piperacillin-tazobactam (ZOSYN) IVPB 3.375 g     3.375 g 100 mL/hr over 30 Minutes  Intravenous  Once 11/07/14 1540 11/07/14 1634         HPI/Subjective: complaining of cramping abdominal pain and left lower quadrant,  Objective: Filed Vitals:   11/07/14 1654 11/07/14 1657 11/07/14 2127 11/08/14 0537  BP: 134/60  141/53 138/50  Pulse: 77  76 72  Temp: 98.3 F (36.8 C)  97.6 F (36.4 C) 97.5 F (36.4 C)  TempSrc: Oral  Oral Oral  Resp: 19  18 18   Height:  5\' 3"  (1.6 m)    Weight:  62.098 kg (136 lb 14.4 oz)    SpO2: 99%  98% 96%    Intake/Output Summary (Last 24 hours) at 11/08/14 1128 Last data filed at 11/08/14 4627  Gross per 24 hour  Intake 2518.33 ml  Output    750 ml  Net 1768.33 ml    Exam:  General: No acute respiratory distress Lungs: Clear to auscultation bilaterally without wheezes or crackles Cardiovascular: Regular rate and rhythm without murmur gallop or rub normal S1 and S2 Abdomen diffuse abdominal pain   nondistended, soft, bowel sounds positive, no rebound, no ascites, no appreciable mass Extremities: No significant cyanosis, clubbing, or edema bilateral lower extremities     Data Review   Micro Results No results found for this or any previous visit (from the past 240 hour(s)).  Radiology Reports Ct Abdomen Pelvis W Contrast  11/07/2014  CLINICAL DATA:  Lower abdominal pain starting Friday night, rectal bleeding starting today. EXAM: CT ABDOMEN AND PELVIS WITH CONTRAST TECHNIQUE: Multidetector CT imaging of the abdomen and pelvis was performed using the standard protocol following bolus administration of intravenous contrast. CONTRAST:  152mL OMNIPAQUE IOHEXOL 300 MG/ML  SOLN COMPARISON:  None. FINDINGS: Lower chest:  No acute findings. Hepatobiliary: Large stone within the fundus of the gallbladder. No gallbladder wall thickening, pericholecystic fluid or other secondary signs of acute cholecystitis. No bile duct dilatation. Liver appears normal. Pancreas: No mass, inflammatory changes, or other significant abnormality. Spleen:  Within normal limits in size and appearance. Adrenals/Urinary Tract: No masses identified. No evidence of hydronephrosis. Stomach/Bowel: There is thickening of the walls of the lower sigmoid colon. Given the scattered diverticulosis within the sigmoid colon, findings likely represent acute diverticulitis. Confluent edema/phlegmon noted along the inferior margin of the involved segment of sigmoid colon without definite abscess. No associated bowel obstruction. Appendix is normal. Vascular/Lymphatic: Atherosclerotic changes of the normal-caliber abdominal aorta. No aortic aneurysm or dissection. Reproductive: Unremarkable. Other: No free intraperitoneal air. Musculoskeletal: Scattered degenerative changes within the thoracolumbar spine but no acute osseous abnormality. 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 IMPRESSION: 1. Acute sigmoid colon diverticulitis with associated bowel wall thickening and pericolic inflammation/fluid stranding. Confluent area of edema/phlegmon along the undersurface of the involved segment of the sigmoid colon measures approximately 3 cm greatest dimension, without evidence of abscess formation at this time, but follow-up CT is recommended to exclude early developing abscess. Would also recommend follow-up CT at some point to ensure resolution of today's findings and exclude the less  likely possibility of this representing a neoplastic bowel wall thickening. 2. No evidence of bowel perforation seen. No free intraperitoneal air. 3. No bowel obstruction. 4. Cholelithiasis without evidence of acute cholecystitis. These results and recommendation were called by telephone at the time of interpretation on 11/07/2014 at 3:21 pm to Dr. Waynetta Pean , who verbally acknowledged these results. Electronically Signed   By: Franki Cabot M.D.   On: 11/07/2014 15:22     CBC  Recent Labs Lab 11/07/14 1252 11/08/14 0439  WBC 10.5 7.2  HGB 13.9 12.4   HCT 40.6 37.4  PLT 254 221  MCV 89.0 89.7  MCH 30.5 29.7  MCHC 34.2 33.2  RDW 12.9 13.2    Chemistries   Recent Labs Lab 11/07/14 1252 11/08/14 0439  NA 130* 139  K 3.9 4.1  CL 93* 105  CO2 29 25  GLUCOSE 97 74  BUN 10 8  CREATININE 0.70 0.64  CALCIUM 9.0 8.9  AST 21  --   ALT 16  --   ALKPHOS 126  --   BILITOT 0.9  --    ------------------------------------------------------------------------------------------------------------------ estimated creatinine clearance is 49.5 mL/min (by C-G formula based on Cr of 0.64). ------------------------------------------------------------------------------------------------------------------ No results for input(s): HGBA1C in the last 72 hours. ------------------------------------------------------------------------------------------------------------------ No results for input(s): CHOL, HDL, LDLCALC, TRIG, CHOLHDL, LDLDIRECT in the last 72 hours. ------------------------------------------------------------------------------------------------------------------ No results for input(s): TSH, T4TOTAL, T3FREE, THYROIDAB in the last 72 hours.  Invalid input(s): FREET3 ------------------------------------------------------------------------------------------------------------------ No results for input(s): VITAMINB12, FOLATE, FERRITIN, TIBC, IRON, RETICCTPCT in the last 72 hours.  Coagulation profile No results for input(s): INR, PROTIME in the last 168 hours.  No results for input(s): DDIMER in the last 72 hours.  Cardiac Enzymes No results for input(s): CKMB, TROPONINI, MYOGLOBIN in the last 168 hours.  Invalid input(s): CK ------------------------------------------------------------------------------------------------------------------ Invalid input(s): POCBNP   CBG: No results for input(s): GLUCAP in the last 168 hours.     Studies: Ct Abdomen Pelvis W Contrast  11/07/2014  CLINICAL DATA:  Lower abdominal pain  starting Friday night, rectal bleeding starting today. EXAM: CT ABDOMEN AND PELVIS WITH CONTRAST TECHNIQUE: Multidetector CT imaging of the abdomen and pelvis was performed using the standard protocol following bolus administration of intravenous contrast. CONTRAST:  113mL OMNIPAQUE IOHEXOL 300 MG/ML  SOLN COMPARISON:  None. FINDINGS: Lower chest:  No acute findings. Hepatobiliary: Large stone within the fundus of the gallbladder. No gallbladder wall thickening, pericholecystic fluid or other secondary signs of acute cholecystitis. No bile duct dilatation. Liver appears normal. Pancreas: No mass, inflammatory changes, or other significant abnormality. Spleen: Within normal limits in size and appearance. Adrenals/Urinary Tract: No masses identified. No evidence of hydronephrosis. Stomach/Bowel: There is thickening of the walls of the lower sigmoid colon. Given the scattered diverticulosis within the sigmoid colon, findings likely represent acute diverticulitis. Confluent edema/phlegmon noted along the inferior margin of the involved segment of sigmoid colon without definite abscess. No associated bowel obstruction. Appendix is normal. Vascular/Lymphatic: Atherosclerotic changes of the normal-caliber abdominal aorta. No aortic aneurysm or dissection. Reproductive: Unremarkable. Other: No free intraperitoneal air. Musculoskeletal: Scattered degenerative changes within the thoracolumbar spine but no acute osseous abnormality. 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 IMPRESSION: 1. Acute sigmoid colon diverticulitis with associated bowel wall thickening and pericolic inflammation/fluid stranding. Confluent area of edema/phlegmon along the undersurface of the involved segment of the sigmoid colon measures approximately 3 cm greatest dimension, without evidence of abscess formation at this time, but follow-up CT is recommended  to exclude early developing abscess. Would also  recommend follow-up CT at some point to ensure resolution of today's findings and exclude the less likely possibility of this representing a neoplastic bowel wall thickening. 2. No evidence of bowel perforation seen. No free intraperitoneal air. 3. No bowel obstruction. 4. Cholelithiasis without evidence of acute cholecystitis. These results and recommendation were called by telephone at the time of interpretation on 11/07/2014 at 3:21 pm to Dr. Waynetta Pean , who verbally acknowledged these results. Electronically Signed   By: Franki Cabot M.D.   On: 11/07/2014 15:22      No results found for: HGBA1C Lab Results  Component Value Date   CREATININE 0.64 11/08/2014       Scheduled Meds: . Oxcarbazepine  300 mg Oral BID  . piperacillin-tazobactam (ZOSYN)  IV  3.375 g Intravenous 3 times per day   Continuous Infusions: . sodium chloride 100 mL/hr at 11/08/14 9728    Principal Problem:   Acute diverticulitis Active Problems:   Seizure disorder ([place])   Insomnia   Dehydration with hyponatremia   Blood in stool   Cholelithiasis    Time spent: 45 minutes   [facility] Pager [phone]. If 7PM-7AM, please contact night-coverage at [email or web address], password [facility] 11/08/2014, 11:28 AM  LOS: 1 day

## 2014-11-08 NOTE — Consult Note (Signed)
[facility] Surgery Consult Note  Joan Mclaughlin Aug 23, 1940  [id number].    Requesting MD: Dr. Veatrice Kells Chief Complaint/Reason for Consult: Sigmoid diverticulitis with perforation and phlegmon  HPI:  74 y/o White female patient history of seizure disorder seizure free since 2013 (diagnosed 2008), chronic insomnia, known diverticula diagnosed on colonoscopy "several years ago" who presents with abdominal pain and blood in stool.  Patient reports that Friday she began having LLQ abdominal pain.  She went to see her primary care physician who felt she had diverticulitis and started her empirically on Cipro, subsequently added Flagyl on Saturday, but didn't start until Sunday.  Abdominal pain increased on Friday, but seemed improved on Saturday.  Of note she had been having indigestion issues including burping, gas pains/bloating, frequent flatus, diarrhea and constipation for the last 2 months.  She never had pain and thus self treated herselfp for indigestion at home.  She never sought medical care until Friday.  No precipitating/allerviating factors.  No radiating pain.  Sunday she noticed small amounts mucoid stools with blood and with everything going on presented to [facility].  She is not having any fevers chills, CP/SOB, N/V.  She thinks she has a h/o hemorrhoids which she treats on her own at home, never diagnosed.     ROS: All systems reviewed and otherwise negative except for as above  Family History  Problem Relation Age of Onset  . Arthritis Father     Past Medical History  Diagnosis Date  . Seizures ([place])   . Anxiety   . Allergy   . Diverticulitis     Past Surgical History  Procedure Laterality Date  . Back surgery  07/2007  . Tubal ligation    . Mouth surgery      Social History:  reports that she has never smoked. She has never used smokeless tobacco. She reports that she does not drink alcohol or use illicit drugs.  Allergies: No Known Allergies  Medications Prior to  Admission  Medication Sig Dispense Refill  . ALPRAZolam (XANAX) 0.25 MG tablet Take 0.25 mg by mouth at bedtime as needed for anxiety.    . Biotin 10 MG CAPS Take 10 mg by mouth daily.    . ciprofloxacin (CIPRO) 500 MG tablet Take 500 mg by mouth 2 (two) times daily. Started on 11-05-14 for 7 days    . fexofenadine (ALLEGRA) 180 MG tablet Take 180 mg by mouth daily.    . metroNIDAZOLE (FLAGYL) 500 MG tablet Take 500 mg by mouth 3 (three) times daily.    . Multiple Vitamins-Minerals (MULTIVITAMIN WITH MINERALS) tablet Take 1 tablet by mouth daily.    . Oxcarbazepine (TRILEPTAL) 300 MG tablet Take 1 tablet (300 mg total) by mouth 2 (two) times daily. 180 tablet 3  . valACYclovir (VALTREX) 500 MG tablet Take 500 mg by mouth as needed.     . zolpidem (AMBIEN) 10 MG tablet Take 10 mg by mouth at bedtime as needed for sleep.      Blood pressure 138/50, pulse 72, temperature 97.5 F (36.4 C), temperature source Oral, resp. rate 18, height 5' 3"  (1.6 m), weight 62.098 kg (136 lb 14.4 oz), SpO2 96 %. Physical Exam: General: pleasant, WD/WN white female who is laying in bed in NAD HEENT: head is normocephalic, atraumatic.  Sclera are noninjected.  Ears and nose without any masses or lesions.  Mouth is pink and moist Heart: regular, rate, and rhythm.  No obvious murmurs, gallops, or rubs noted.  Palpable pedal  pulses bilaterally Lungs: CTAB, no wheezes, rhonchi, or rales noted.  Respiratory effort nonlabored Abd: soft, mild distension and tender, +BS, no masses, hernias, or organomegaly, umbilical scar well healed MS: all 4 extremities are symmetrical with no cyanosis, clubbing, or edema. Skin: warm and dry small external hemorrhoid without active bleeding or thrombosis, large skin tag 2cm wide at gluteal cleft Psych: A&Ox3 with an appropriate affect.   Results for orders placed or performed during the hospital encounter of 11/07/14 (from the past 48 hour(s))  Urinalysis, Routine w reflex microscopic  (not at [facility])     Status: Abnormal   Collection Time: 11/07/14 12:41 PM  Result Value Ref Range   Color, Urine AMBER (A) YELLOW    Comment: BIOCHEMICALS MAY BE AFFECTED BY COLOR   APPearance TURBID (A) CLEAR   Specific Gravity, Urine 1.024 1.005 - 1.030   pH 5.5 5.0 - 8.0   Glucose, UA NEGATIVE NEGATIVE mg/dL   Hgb urine dipstick MODERATE (A) NEGATIVE   Bilirubin Urine MODERATE (A) NEGATIVE   Ketones, ur 40 (A) NEGATIVE mg/dL   Protein, ur 100 (A) NEGATIVE mg/dL   Urobilinogen, UA 1.0 0.0 - 1.0 mg/dL   Nitrite NEGATIVE NEGATIVE   Leukocytes, UA TRACE (A) NEGATIVE  Urine microscopic-add on     Status: Abnormal   Collection Time: 11/07/14 12:41 PM  Result Value Ref Range   WBC, UA 0-2 <3 WBC/hpf   RBC / HPF 3-6 <3 RBC/hpf   Bacteria, UA FEW (A) RARE   Urine-Other LESS THAN 10 mL OF URINE SUBMITTED     Comment: MICROSCOPIC EXAM PERFORMED ON UNCONCENTRATED URINE  Comprehensive metabolic panel     Status: Abnormal   Collection Time: 11/07/14 12:52 PM  Result Value Ref Range   Sodium 130 (L) 135 - 145 mmol/L   Potassium 3.9 3.5 - 5.1 mmol/L   Chloride 93 (L) 101 - 111 mmol/L   CO2 29 22 - 32 mmol/L   Glucose, Bld 97 65 - 99 mg/dL   BUN 10 6 - 20 mg/dL   Creatinine, Ser 0.70 0.44 - 1.00 mg/dL   Calcium 9.0 8.9 - 10.3 mg/dL   Total Protein 7.9 6.5 - 8.1 g/dL   Albumin 3.7 3.5 - 5.0 g/dL   AST 21 15 - 41 U/L   ALT 16 14 - 54 U/L   Alkaline Phosphatase 126 38 - 126 U/L   Total Bilirubin 0.9 0.3 - 1.2 mg/dL   GFR calc non Af Amer >60 >60 mL/min   GFR calc Af Amer >60 >60 mL/min    Comment: (NOTE) The eGFR has been calculated using the CKD EPI equation. This calculation has not been validated in all clinical situations. eGFR's persistently <60 mL/min signify possible Chronic Kidney Disease.    Anion gap 8 5 - 15  CBC     Status: None   Collection Time: 11/07/14 12:52 PM  Result Value Ref Range   WBC 10.5 4.0 - 10.5 K/uL   RBC 4.56 3.87 - 5.11 MIL/uL   Hemoglobin 13.9 12.0 -  15.0 g/dL   HCT 40.6 36.0 - 46.0 %   MCV 89.0 78.0 - 100.0 fL   MCH 30.5 26.0 - 34.0 pg   MCHC 34.2 30.0 - 36.0 g/dL   RDW 12.9 11.5 - 15.5 %   Platelets 254 150 - 400 K/uL  Basic metabolic panel     Status: None   Collection Time: 11/08/14  4:39 AM  Result Value Ref Range   Sodium  139 135 - 145 mmol/L    Comment: DELTA CHECK NOTED   Potassium 4.1 3.5 - 5.1 mmol/L   Chloride 105 101 - 111 mmol/L   CO2 25 22 - 32 mmol/L   Glucose, Bld 74 65 - 99 mg/dL   BUN 8 6 - 20 mg/dL   Creatinine, Ser 0.64 0.44 - 1.00 mg/dL   Calcium 8.9 8.9 - 10.3 mg/dL   GFR calc non Af Amer >60 >60 mL/min   GFR calc Af Amer >60 >60 mL/min    Comment: (NOTE) The eGFR has been calculated using the CKD EPI equation. This calculation has not been validated in all clinical situations. eGFR's persistently <60 mL/min signify possible Chronic Kidney Disease.    Anion gap 9 5 - 15  CBC     Status: None   Collection Time: 11/08/14  4:39 AM  Result Value Ref Range   WBC 7.2 4.0 - 10.5 K/uL   RBC 4.17 3.87 - 5.11 MIL/uL   Hemoglobin 12.4 12.0 - 15.0 g/dL   HCT 37.4 36.0 - 46.0 %   MCV 89.7 78.0 - 100.0 fL   MCH 29.7 26.0 - 34.0 pg   MCHC 33.2 30.0 - 36.0 g/dL   RDW 13.2 11.5 - 15.5 %   Platelets 221 150 - 400 K/uL   Ct Abdomen Pelvis W Contrast  11/07/2014  CLINICAL DATA:  Lower abdominal pain starting Friday night, rectal bleeding starting today. EXAM: CT ABDOMEN AND PELVIS WITH CONTRAST TECHNIQUE: Multidetector CT imaging of the abdomen and pelvis was performed using the standard protocol following bolus administration of intravenous contrast. CONTRAST:  111m OMNIPAQUE IOHEXOL 300 MG/ML  SOLN COMPARISON:  None. FINDINGS: Lower chest:  No acute findings. Hepatobiliary: Large stone within the fundus of the gallbladder. No gallbladder wall thickening, pericholecystic fluid or other secondary signs of acute cholecystitis. No bile duct dilatation. Liver appears normal. Pancreas: No mass, inflammatory changes, or  other significant abnormality. Spleen: Within normal limits in size and appearance. Adrenals/Urinary Tract: No masses identified. No evidence of hydronephrosis. Stomach/Bowel: There is thickening of the walls of the lower sigmoid colon. Given the scattered diverticulosis within the sigmoid colon, findings likely represent acute diverticulitis. Confluent edema/phlegmon noted along the inferior margin of the involved segment of sigmoid colon without definite abscess. No associated bowel obstruction. Appendix is normal. Vascular/Lymphatic: Atherosclerotic changes of the normal-caliber abdominal aorta. No aortic aneurysm or dissection. Reproductive: Unremarkable. Other: No free intraperitoneal air. Musculoskeletal: Scattered degenerative changes within the thoracolumbar spine but no acute osseous abnormality. 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 IMPRESSION: 1. Acute sigmoid colon diverticulitis with associated bowel wall thickening and pericolic inflammation/fluid stranding. Confluent area of edema/phlegmon along the undersurface of the involved segment of the sigmoid colon measures approximately 3 cm greatest dimension, without evidence of abscess formation at this time, but follow-up CT is recommended to exclude early developing abscess. Would also recommend follow-up CT at some point to ensure resolution of today's findings and exclude the less likely possibility of this representing a neoplastic bowel wall thickening. 2. No evidence of bowel perforation seen. No free intraperitoneal air. 3. No bowel obstruction. 4. Cholelithiasis without evidence of acute cholecystitis. These results and recommendation were called by telephone at the time of interpretation on 11/07/2014 at 3:21 pm to Dr. WWaynetta Pean, who verbally acknowledged these results. Electronically Signed   By: SFranki CabotM.D.   On: 11/07/2014 15:22      Assessment/Plan Acute sigmoid colon  diverticulitis with phlegmon 3cm -Normal WBC, afebrile, vitals are stable -Conservative management for now, not likely amenable to IR perc drainage at this time -NPO, bowel rest, IVF, pain control, antiemetics, antibiotics (Zosyn Day #2) -Repeat CT at some point -If no improvement over the next few days or sudden worsening may require surgical intervention which may include a colostomy.  She is agreeable to our plan.   Lower GI bleed/External hemorrhoid -Tucks, sitz baths with epsom salts Seizure disorder Cholelithiasis - incidental, asymptomatic DVT proph - SCD's (refuses) and lovenox for DVT proph, mobilization  Talked to son on the phone   Nat Christen, [facility] Surgery 11/08/2014, 10:17 AM Pager: [phone]

## 2014-11-09 LAB — C DIFFICILE QUICK SCREEN W PCR REFLEX
C DIFFICLE (CDIFF) ANTIGEN: NEGATIVE
C Diff interpretation: NEGATIVE
C Diff toxin: NEGATIVE

## 2014-11-09 MED ORDER — SACCHAROMYCES BOULARDII 250 MG PO CAPS
250.0000 mg | ORAL_CAPSULE | Freq: Two times a day (BID) | ORAL | Status: DC
  Administered 2014-11-09 – 2014-11-11 (×5): 250 mg via ORAL
  Filled 2014-11-09 (×5): qty 1

## 2014-11-09 NOTE — Progress Notes (Addendum)
Pharmacist Provided - Patient Medication Education  Joan Mclaughlin is an 74 y.o. female who presented to [facility] on 11/07/2014 with a chief complaint of  Chief Complaint  Patient presents with  . Abdominal Cramping     Assessment: I reviewed all of the patients current medications with her. Patient wanted to know if the witch hazel pads she was currently using inpatient were similar to the TUCKS pads she used as an outpatient. I let the patient know that they were the same product. Patient had no other questions at this time.  Time spent preparing for discharge counseling: 15 mins Time spent counseling patient: 10 mins  Darl Pikes, PharmD Clinical Pharmacist- Resident Pager: [phone]  Darl Pikes, PharmD 11/09/2014, 6:15 PM

## 2014-11-09 NOTE — Progress Notes (Signed)
[facility] PROGRESS NOTE  Joan Mclaughlin WIO:[id number] DOB: December 08, 1940 DOA: 11/07/2014 PCP: Joan Cipro, MD  Length of stay: 2   Assessment/Plan: Principal Problem:   Acute diverticulitis Active Problems:   Seizure disorder ([place])   Insomnia   Dehydration with hyponatremia   Blood in stool   Cholelithiasis      Acute diverticulitis with phlegmon/Blood in stool Seen by general surgery , had diarrhea all night , cramping improved , no rectal bleeding -Currently no fever or leukocytosis but patient was on Mclaughlin and Flagyl prior to admission ,now on zosyn  if continued pain for 5 days after admission consider repeating CT of the abdomen to determine if phlegmon has evolved into abscess Advanced to clear liquids  bowel rest and IV fluids; since no obstructive pattern will allow necessary meds with a sip of water -IV morphine for pain -Once medically stable, in about 6 weeks, patient will need to be referred to a primary gastroenterologist Dr. Oletta Mclaughlin for colonoscopy -Avoid NSAIDs despite inflammatory changes in setting of blood in stool Check C. difficile PCR, stool culture Start probiotic for diarrhea Avoid Imodium until stool studies have resulted    Dehydration with hyponatremia Continue IV fluid to diarrhea, sodium has improved from 130 to 138 -Electrolyte panel in a.m.   Seizure disorder (Joan Mclaughlin) -On Trileptal prior to admission, seizure free per pt  -No seizure since 2013 -Allow home AED with sips of water    Insomnia -Allow prn Ambien with sips of water   Cholelithiasis -Incidental finding on CT scan -Currently asymptomatic  DVT prophylaxsis SCDs  Code Status:      Code Status Orders        Start     Ordered   11/07/14 1630  Full code   Continuous     11/07/14 1629    Advance Directive Documentation        Most Recent Value   Type of Advance Directive  Living will   Pre-existing out of facility DNR order (yellow form or pink  MOST form)     "MOST" Form in Place?       Family Communication: family updated about patient's clinical progress Disposition Plan:  2-3 days    Brief narrative: 74 yo female patient history of seizure disorder seizure free since 2013 (diagnosed 2008), chronic insomnia, known diverticula diagnosed on colonoscopy "several years ago" who presents with abdominal pain and blood in stool. Patient reports that this past Friday she began having left lower quadrant abdominal pain and went to see her primary care physician who felt she had diverticulitis and started her empirically on Mclaughlin and Flagyl. Abdominal pain increased on Friday but seemed improved on Saturday. By today she had noticed mucoid stools with blood and presented to the ER. Prior to arrival she had 2 stools with dark red blood and mucus that were small volume. She is not having any fevers chills or nausea and vomiting. In addition to the left lower quadrant discomfort she was also having a sensation of suprapubic fullness.  In the ER she was afebrile, BP 110/58, pulse regular 83 respirations 16 room air sats 98%. Sodium was low at 130 as was chloride 93, otherwise electronic panel unremarkable, CBC unremarkable. Urinalysis appeared consistent with dehydration. Urine culture was obtained by the ER. CT of the abdomen and pelvis with contrast revealed acute sigmoid colon diverticulitis with associated bowel wall thickening and pericolic inflammation and fluid stranding. It was also confluent area of edema/phlegmon  along the undersurface of the involved segment of sigmoid colon that measures 3 cm at greatest dimension but no evidence of abscess formation and no free air. Follow-up CT was recommended by the radiologist  Consultants:  CCS  Procedures:  None  Antibiotics: Anti-infectives    Start     Dose/Rate Route Frequency Ordered Stop   11/09/14 0200  piperacillin-tazobactam (ZOSYN) IVPB 3.375 g     3.375 g 12.5 mL/hr over 240  Minutes Intravenous Every 8 hours 11/08/14 1828     11/07/14 2200  piperacillin-tazobactam (ZOSYN) IVPB 3.375 g  Status:  Discontinued     3.375 g 12.5 mL/hr over 240 Minutes Intravenous 3 times per day 11/07/14 1651 11/08/14 1828   11/07/14 1545  piperacillin-tazobactam (ZOSYN) IVPB 3.375 g     3.375 g 100 mL/hr over 30 Minutes Intravenous  Once 11/07/14 1540 11/07/14 1634         HPI/Subjective:  had diarrhea all night , cramping improved   Objective: Filed Vitals:   11/08/14 1517 11/08/14 2115 11/08/14 2332 11/09/14 0531  BP: 120/46 124/46 147/66 109/47  Pulse: 67 66 88 75  Temp: 97.8 F (36.6 C) 98.2 F (36.8 C)  97.7 F (36.5 C)  TempSrc: Oral Oral  Oral  Resp: 16 16  16   Height:      Weight:      SpO2: 100% 96% 96% 100%    Intake/Output Summary (Last 24 hours) at 11/09/14 1025 Last data filed at 11/09/14 0932  Gross per 24 hour  Intake   1250 ml  Output      0 ml  Net   1250 ml    Exam:  General: No acute respiratory distress Lungs: Clear to auscultation bilaterally without wheezes or crackles Cardiovascular: Regular rate and rhythm without murmur gallop or rub normal S1 and S2 Abdomen diffuse abdominal pain   nondistended, soft, bowel sounds positive, no rebound, no ascites, no appreciable mass Extremities: No significant cyanosis, clubbing, or edema bilateral lower extremities     Data Review   Micro Results Recent Results (from the past 240 hour(s))  Urine culture     Status: None   Collection Time: 11/07/14 12:41 PM  Result Value Ref Range Status   Specimen Description URINE, RANDOM  Final   Special Requests NONE  Final   Culture NO GROWTH 1 DAY  Final   Report Status 11/08/2014 FINAL  Final    Radiology Reports Ct Abdomen Pelvis W Contrast  11/07/2014  CLINICAL DATA:  Lower abdominal pain starting Friday night, rectal bleeding starting today. EXAM: CT ABDOMEN AND PELVIS WITH CONTRAST TECHNIQUE: Multidetector CT imaging of the abdomen and  pelvis was performed using the standard protocol following bolus administration of intravenous contrast. CONTRAST:  133mL OMNIPAQUE IOHEXOL 300 MG/ML  SOLN COMPARISON:  None. FINDINGS: Lower chest:  No acute findings. Hepatobiliary: Large stone within the fundus of the gallbladder. No gallbladder wall thickening, pericholecystic fluid or other secondary signs of acute cholecystitis. No bile duct dilatation. Liver appears normal. Pancreas: No mass, inflammatory changes, or other significant abnormality. Spleen: Within normal limits in size and appearance. Adrenals/Urinary Tract: No masses identified. No evidence of hydronephrosis. Stomach/Bowel: There is thickening of the walls of the lower sigmoid colon. Given the scattered diverticulosis within the sigmoid colon, findings likely represent acute diverticulitis. Confluent edema/phlegmon noted along the inferior margin of the involved segment of sigmoid colon without definite abscess. No associated bowel obstruction. Appendix is normal. Vascular/Lymphatic: Atherosclerotic changes of the normal-caliber abdominal aorta.  No aortic aneurysm or dissection. Reproductive: Unremarkable. Other: No free intraperitoneal air. Musculoskeletal: Scattered degenerative changes within the thoracolumbar spine but no acute osseous abnormality. 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 IMPRESSION: 1. Acute sigmoid colon diverticulitis with associated bowel wall thickening and pericolic inflammation/fluid stranding. Confluent area of edema/phlegmon along the undersurface of the involved segment of the sigmoid colon measures approximately 3 cm greatest dimension, without evidence of abscess formation at this time, but follow-up CT is recommended to exclude early developing abscess. Would also recommend follow-up CT at some point to ensure resolution of today's findings and exclude the less likely possibility of this representing a neoplastic  bowel wall thickening. 2. No evidence of bowel perforation seen. No free intraperitoneal air. 3. No bowel obstruction. 4. Cholelithiasis without evidence of acute cholecystitis. These results and recommendation were called by telephone at the time of interpretation on 11/07/2014 at 3:21 pm to Dr. Waynetta Pean , who verbally acknowledged these results. Electronically Signed   By: Franki Cabot M.D.   On: 11/07/2014 15:22     CBC  Recent Labs Lab 11/07/14 1252 11/08/14 0439  WBC 10.5 7.2  HGB 13.9 12.4  HCT 40.6 37.4  PLT 254 221  MCV 89.0 89.7  MCH 30.5 29.7  MCHC 34.2 33.2  RDW 12.9 13.2    Chemistries   Recent Labs Lab 11/07/14 1252 11/08/14 0439 11/08/14 1154  NA 130* 139 138  K 3.9 4.1 3.6  CL 93* 105 101  CO2 29 25 21*  GLUCOSE 97 74 62*  BUN 10 8 9   CREATININE 0.70 0.64 0.67  CALCIUM 9.0 8.9 8.9  AST 21  --   --   ALT 16  --   --   ALKPHOS 126  --   --   BILITOT 0.9  --   --    ------------------------------------------------------------------------------------------------------------------ estimated creatinine clearance is 49.5 mL/min (by C-G formula based on Cr of 0.67). ------------------------------------------------------------------------------------------------------------------ No results for input(s): HGBA1C in the last 72 hours. ------------------------------------------------------------------------------------------------------------------ No results for input(s): CHOL, HDL, LDLCALC, TRIG, CHOLHDL, LDLDIRECT in the last 72 hours. ------------------------------------------------------------------------------------------------------------------ No results for input(s): TSH, T4TOTAL, T3FREE, THYROIDAB in the last 72 hours.  Invalid input(s): FREET3 ------------------------------------------------------------------------------------------------------------------ No results for input(s): VITAMINB12, FOLATE, FERRITIN, TIBC, IRON, RETICCTPCT in the last  72 hours.  Coagulation profile No results for input(s): INR, PROTIME in the last 168 hours.  No results for input(s): DDIMER in the last 72 hours.  Cardiac Enzymes No results for input(s): CKMB, TROPONINI, MYOGLOBIN in the last 168 hours.  Invalid input(s): CK ------------------------------------------------------------------------------------------------------------------ Invalid input(s): POCBNP   CBG: No results for input(s): GLUCAP in the last 168 hours.     Studies: Ct Abdomen Pelvis W Contrast  11/07/2014  CLINICAL DATA:  Lower abdominal pain starting Friday night, rectal bleeding starting today. EXAM: CT ABDOMEN AND PELVIS WITH CONTRAST TECHNIQUE: Multidetector CT imaging of the abdomen and pelvis was performed using the standard protocol following bolus administration of intravenous contrast. CONTRAST:  161mL OMNIPAQUE IOHEXOL 300 MG/ML  SOLN COMPARISON:  None. FINDINGS: Lower chest:  No acute findings. Hepatobiliary: Large stone within the fundus of the gallbladder. No gallbladder wall thickening, pericholecystic fluid or other secondary signs of acute cholecystitis. No bile duct dilatation. Liver appears normal. Pancreas: No mass, inflammatory changes, or other significant abnormality. Spleen: Within normal limits in size and appearance. Adrenals/Urinary Tract: No masses identified. No evidence of hydronephrosis. Stomach/Bowel: There is thickening of the walls of the lower sigmoid colon.  Given the scattered diverticulosis within the sigmoid colon, findings likely represent acute diverticulitis. Confluent edema/phlegmon noted along the inferior margin of the involved segment of sigmoid colon without definite abscess. No associated bowel obstruction. Appendix is normal. Vascular/Lymphatic: Atherosclerotic changes of the normal-caliber abdominal aorta. No aortic aneurysm or dissection. Reproductive: Unremarkable. Other: No free intraperitoneal air. Musculoskeletal: Scattered  degenerative changes within the thoracolumbar spine but no acute osseous abnormality. 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 IMPRESSION: 1. Acute sigmoid colon diverticulitis with associated bowel wall thickening and pericolic inflammation/fluid stranding. Confluent area of edema/phlegmon along the undersurface of the involved segment of the sigmoid colon measures approximately 3 cm greatest dimension, without evidence of abscess formation at this time, but follow-up CT is recommended to exclude early developing abscess. Would also recommend follow-up CT at some point to ensure resolution of today's findings and exclude the less likely possibility of this representing a neoplastic bowel wall thickening. 2. No evidence of bowel perforation seen. No free intraperitoneal air. 3. No bowel obstruction. 4. Cholelithiasis without evidence of acute cholecystitis. These results and recommendation were called by telephone at the time of interpretation on 11/07/2014 at 3:21 pm to Dr. Waynetta Pean , who verbally acknowledged these results. Electronically Signed   By: Franki Cabot M.D.   On: 11/07/2014 15:22      No results found for: HGBA1C Lab Results  Component Value Date   CREATININE 0.67 11/08/2014       Scheduled Meds: . enoxaparin (LOVENOX) injection  40 mg Subcutaneous Q24H  . Oxcarbazepine  300 mg Oral BID  . piperacillin-tazobactam (ZOSYN)  IV  3.375 g Intravenous Q8H   Continuous Infusions: . sodium chloride 100 mL/hr at 11/09/14 4888    Principal Problem:   Acute diverticulitis Active Problems:   Seizure disorder ([place])   Insomnia   Dehydration with hyponatremia   Blood in stool   Cholelithiasis    Time spent: 45 minutes   [facility] Pager [phone]. If 7PM-7AM, please contact night-coverage at [email or web address], password [facility] 11/09/2014, 10:25 AM  LOS: 2 days

## 2014-11-09 NOTE — Progress Notes (Signed)
Subjective: Pain resolved, tolerating ice chips, +diarrhea with urgency  Objective: Vital signs in last 24 hours: Temp:  [97.7 F (36.5 C)-98.2 F (36.8 C)] 97.7 F (36.5 C) (11/01 0531) Pulse Rate:  [66-88] 75 (11/01 0531) Resp:  [16] 16 (11/01 0531) BP: (109-147)/(46-66) 109/47 mmHg (11/01 0531) SpO2:  [96 %-100 %] 100 % (11/01 0531) Last BM Date: 11/19/14  Intake/Output from previous day: 10/31 0701 - 11/01 0700 In: 1250 [I.V.:1200; IV Piggyback:50] Out: -  Intake/Output this shift:    General appearance: alert and cooperative Head: Normocephalic, without obvious abnormality, atraumatic Resp: clear to auscultation bilaterally Cardio: regular rate and rhythm, S1, S2 normal, no murmur, click, rub or gallop GI: soft, non-tender; bowel sounds normal; no masses,  no organomegaly  Lab Results:   Recent Labs  11/07/14 1252 11/08/14 0439  WBC 10.5 7.2  HGB 13.9 12.4  HCT 40.6 37.4  PLT 254 221   BMET  Recent Labs  11/08/14 0439 11/08/14 1154  NA 139 138  K 4.1 3.6  CL 105 101  CO2 25 21*  GLUCOSE 74 62*  BUN 8 9  CREATININE 0.64 0.67  CALCIUM 8.9 8.9   PT/INR No results for input(s): LABPROT, INR in the last 72 hours. ABG No results for input(s): PHART, HCO3 in the last 72 hours.  Invalid input(s): PCO2, PO2  Studies/Results: Ct Abdomen Pelvis W Contrast  11/07/2014  CLINICAL DATA:  Lower abdominal pain starting Friday night, rectal bleeding starting today. EXAM: CT ABDOMEN AND PELVIS WITH CONTRAST TECHNIQUE: Multidetector CT imaging of the abdomen and pelvis was performed using the standard protocol following bolus administration of intravenous contrast. CONTRAST:  119mL OMNIPAQUE IOHEXOL 300 MG/ML  SOLN COMPARISON:  None. FINDINGS: Lower chest:  No acute findings. Hepatobiliary: Large stone within the fundus of the gallbladder. No gallbladder wall thickening, pericholecystic fluid or other secondary signs of acute cholecystitis. No bile duct  dilatation. Liver appears normal. Pancreas: No mass, inflammatory changes, or other significant abnormality. Spleen: Within normal limits in size and appearance. Adrenals/Urinary Tract: No masses identified. No evidence of hydronephrosis. Stomach/Bowel: There is thickening of the walls of the lower sigmoid colon. Given the scattered diverticulosis within the sigmoid colon, findings likely represent acute diverticulitis. Confluent edema/phlegmon noted along the inferior margin of the involved segment of sigmoid colon without definite abscess. No associated bowel obstruction. Appendix is normal. Vascular/Lymphatic: Atherosclerotic changes of the normal-caliber abdominal aorta. No aortic aneurysm or dissection. Reproductive: Unremarkable. Other: No free intraperitoneal air. Musculoskeletal: Scattered degenerative changes within the thoracolumbar spine but no acute osseous abnormality. 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 IMPRESSION: 1. Acute sigmoid colon diverticulitis with associated bowel wall thickening and pericolic inflammation/fluid stranding. Confluent area of edema/phlegmon along the undersurface of the involved segment of the sigmoid colon measures approximately 3 cm greatest dimension, without evidence of abscess formation at this time, but follow-up CT is recommended to exclude early developing abscess. Would also recommend follow-up CT at some point to ensure resolution of today's findings and exclude the less likely possibility of this representing a neoplastic bowel wall thickening. 2. No evidence of bowel perforation seen. No free intraperitoneal air. 3. No bowel obstruction. 4. Cholelithiasis without evidence of acute cholecystitis. These results and recommendation were called by telephone at the time of interpretation on 11/07/2014 at 3:21 pm to Dr. Waynetta Pean , who verbally acknowledged these results. Electronically Signed   By: Franki Cabot  M.D.   On: 11/07/2014 15:22  Anti-infectives: Anti-infectives    Start     Dose/Rate Route Frequency Ordered Stop   11/09/14 0200  piperacillin-tazobactam (ZOSYN) IVPB 3.375 g     3.375 g 12.5 mL/hr over 240 Minutes Intravenous Every 8 hours 11/08/14 1828     11/07/14 2200  piperacillin-tazobactam (ZOSYN) IVPB 3.375 g  Status:  Discontinued     3.375 g 12.5 mL/hr over 240 Minutes Intravenous 3 times per day 11/07/14 1651 11/08/14 1828   11/07/14 1545  piperacillin-tazobactam (ZOSYN) IVPB 3.375 g     3.375 g 100 mL/hr over 30 Minutes Intravenous  Once 11/07/14 1540 11/07/14 1634      Assessment/Plan: s/p * No surgery found * Colonic diverticulitis -continue IV abx -advance to clears -out of bed  LOS: 2 days    Arta Bruce Bandon Sherwin 11/09/2014

## 2014-11-09 NOTE — Progress Notes (Signed)
Patient requested to take a shower.  Paged MD for further instructions

## 2014-11-10 DIAGNOSIS — K5792 Diverticulitis of intestine, part unspecified, without perforation or abscess without bleeding: Secondary | ICD-10-CM

## 2014-11-10 DIAGNOSIS — E871 Hypo-osmolality and hyponatremia: Secondary | ICD-10-CM

## 2014-11-10 DIAGNOSIS — G40909 Epilepsy, unspecified, not intractable, without status epilepticus: Secondary | ICD-10-CM

## 2014-11-10 DIAGNOSIS — K921 Melena: Secondary | ICD-10-CM

## 2014-11-10 LAB — COMPREHENSIVE METABOLIC PANEL
ALT: 20 U/L (ref 14–54)
AST: 28 U/L (ref 15–41)
Albumin: 3.3 g/dL — ABNORMAL LOW (ref 3.5–5.0)
Alkaline Phosphatase: 85 U/L (ref 38–126)
Anion gap: 7 (ref 5–15)
BUN: <5 mg/dL — ABNORMAL LOW (ref 6–20)
CHLORIDE: 102 mmol/L (ref 101–111)
CO2: 27 mmol/L (ref 22–32)
Calcium: 9 mg/dL (ref 8.9–10.3)
Creatinine, Ser: 0.58 mg/dL (ref 0.44–1.00)
Glucose, Bld: 95 mg/dL (ref 65–99)
POTASSIUM: 3.6 mmol/L (ref 3.5–5.1)
SODIUM: 136 mmol/L (ref 135–145)
Total Bilirubin: 0.8 mg/dL (ref 0.3–1.2)
Total Protein: 6.8 g/dL (ref 6.5–8.1)

## 2014-11-10 LAB — CBC
HCT: 37 % (ref 36.0–46.0)
Hemoglobin: 12.5 g/dL (ref 12.0–15.0)
MCH: 30 pg (ref 26.0–34.0)
MCHC: 33.8 g/dL (ref 30.0–36.0)
MCV: 88.7 fL (ref 78.0–100.0)
PLATELETS: 241 10*3/uL (ref 150–400)
RBC: 4.17 MIL/uL (ref 3.87–5.11)
RDW: 13.1 % (ref 11.5–15.5)
WBC: 6.4 10*3/uL (ref 4.0–10.5)

## 2014-11-10 MED ORDER — DIPHENOXYLATE-ATROPINE 2.5-0.025 MG PO TABS
2.0000 | ORAL_TABLET | Freq: Once | ORAL | Status: AC
  Administered 2014-11-10: 2 via ORAL
  Filled 2014-11-10: qty 2

## 2014-11-10 NOTE — Progress Notes (Signed)
Brief Nutrition Note  RD received consult for diverticulitis diet education. This RD attempted education x 4 within the past 24 hours- pt was either in with other providers or unavailable at times of visits.   RD will attempt education at a later date, as time allows.   If further nutritional needs arise, please do not hesitate to consult RD.   Jalin Alicea A. Jimmye Norman, RD, LDN, CDE Pager: [phone] After hours Pager: [phone]

## 2014-11-10 NOTE — Care Management Important Message (Signed)
Important Message  Patient Details  Name: Joan Mclaughlin MRN: [id number] Date of Birth: 11/20/1940   Medicare Important Message Given:  Yes-second notification given    Nathen May 11/10/2014, 11:44 AM

## 2014-11-10 NOTE — Progress Notes (Signed)
PROGRESS NOTE  Joan Mclaughlin YSA:[id number] DOB: 02-11-1940 DOA: 11/07/2014 PCP: [name], MD   HPI: 74 yo lady with acute sigmoid diverticulitis  Subjective / 24 H Interval events - no abdominal pain, no nausea/vomiting - has diarrhea  Assessment/Plan: Principal Problem:   Acute diverticulitis Active Problems:   Seizure disorder ([place])   Insomnia   Dehydration with hyponatremia   Blood in stool   Cholelithiasis    Acute diverticulitis with phlegmon/Blood in stool - general surgery following,  - Currently no fever or leukocytosis but patient was on Cipro and Flagyl prior to admission, now on zosyn - advance diet - Once medically stable, in about 6 weeks, patient will need to be referred to a primary gastroenterologist Dr. Oletta Lamas for colonoscopy - Avoid NSAIDs despite inflammatory changes in setting of blood in stool - C. difficile PCR, negative stool culture - Start probiotic for diarrhea  Dehydration with hyponatremia - Continue IV fluid to diarrhea, sodium has improved from 130 to 138 - Electrolyte panel in a.m.  Seizure disorder ([place]) - On Trileptal prior to admission, seizure free per pt  - No seizure since 2013 - Allow home AED with sips of water  Insomnia - Allow prn Ambien with sips of water  Cholelithiasis - Incidental finding on CT scan - Currently asymptomatic   Diet: Diet full liquid Room service appropriate?: Yes; Fluid consistency:: Thin Fluids: NS DVT Prophylaxis: Lovenox  Code Status: Full Code Family Communication: no family bedside  Disposition Plan: home when ready   Barriers to discharge: advance diet, surgery consult, IV antibiotics  Consultants:  General Surgery   Procedures:  None    Antibiotics Zosyn 10/30 >>   Studies  No results found.  Objective  Filed Vitals:   11/09/14 2148 11/09/14 2324 11/10/14 0545 11/10/14 1326  BP: 152/56 133/70 140/62 146/56  Pulse: 78 72 66 73  Temp: 98.9 F (37.2 C)   98.2 F (36.8 C) 98.1 F (36.7 C)  TempSrc: Oral  Oral Oral  Resp: 18  18   Height:      Weight:      SpO2: 98%  98% 100%    Intake/Output Summary (Last 24 hours) at 11/10/14 1450 Last data filed at 11/10/14 0956  Gross per 24 hour  Intake   1536 ml  Output      0 ml  Net   1536 ml   Filed Weights   11/07/14 1128 11/07/14 1657  Weight: 62.097 kg (136 lb 14.4 oz) 62.098 kg (136 lb 14.4 oz)    Exam:  GENERAL: NAD  HEENT: head NCAT, no scleral icterus. Pupils round and reactive.   NECK: Supple.   LUNGS: Clear to auscultation. No wheezing or crackles  HEART: Regular rate and rhythm without murmur. 2+ pulses, no JVD, no peripheral edema  ABDOMEN: Soft, non-distended, non-tender. Positive bowel sounds.  EXTREMITIES: Without any cyanosis or clubbing. Good muscle tone  NEUROLOGIC: Alert and oriented x3. Cranial nerves II through XII are grossly intact. Strength 5/5 in all 4.   Data Reviewed: Basic Metabolic Panel:  Recent Labs Lab 11/07/14 1252 11/08/14 0439 11/08/14 1154 11/10/14 0516  NA 130* 139 138 136  K 3.9 4.1 3.6 3.6  CL 93* 105 101 102  CO2 29 25 21* 27  GLUCOSE 97 74 62* 95  BUN 10 8 9  <5*  CREATININE 0.70 0.64 0.67 0.58  CALCIUM 9.0 8.9 8.9 9.0   Liver Function Tests:  Recent Labs Lab 11/07/14 1252 11/10/14 0516  AST  21 28  ALT 16 20  ALKPHOS 126 85  BILITOT 0.9 0.8  PROT 7.9 6.8  ALBUMIN 3.7 3.3*   CBC:  Recent Labs Lab 11/07/14 1252 11/08/14 0439 11/10/14 0516  WBC 10.5 7.2 6.4  HGB 13.9 12.4 12.5  HCT 40.6 37.4 37.0  MCV 89.0 89.7 88.7  PLT 254 221 241    Recent Results (from the past 240 hour(s))  Urine culture     Status: None   Collection Time: 11/07/14 12:41 PM  Result Value Ref Range Status   Specimen Description URINE, RANDOM  Final   Special Requests NONE  Final   Culture NO GROWTH 1 DAY  Final   Report Status 11/08/2014 FINAL  Final  C difficile quick scan w PCR reflex     Status: None   Collection Time:  11/09/14 10:31 AM  Result Value Ref Range Status   C Diff antigen NEGATIVE NEGATIVE Final   C Diff toxin NEGATIVE NEGATIVE Final   C Diff interpretation Negative for toxigenic C. difficile  Final     Scheduled Meds: . enoxaparin (LOVENOX) injection  40 mg Subcutaneous Q24H  . Oxcarbazepine  300 mg Oral BID  . piperacillin-tazobactam (ZOSYN)  IV  3.375 g Intravenous Q8H  . saccharomyces boulardii  250 mg Oral BID   Continuous Infusions: . sodium chloride 100 mL/hr at 11/09/14 [place], MD [facility] Pager [phone]. If 7 PM - 7 AM, please contact night-coverage at [email or web address], password [facility] 11/10/2014, 2:50 PM  LOS: 3 days

## 2014-11-10 NOTE — Progress Notes (Signed)
Patient ID: Joan Mclaughlin, female   DOB: 25-Dec-1940, 74 y.o.   MRN: [id number]    Subjective: Pt feels weak today secondary to diarrhea.  c diff negative.  Has no pain.  Tolerating clear liquids  Objective: Vital signs in last 24 hours: Temp:  [98.2 F (36.8 C)-98.9 F (37.2 C)] 98.2 F (36.8 C) (11/02 0545) Pulse Rate:  [66-89] 66 (11/02 0545) Resp:  [18-22] 18 (11/02 0545) BP: (133-152)/(56-70) 140/62 mmHg (11/02 0545) SpO2:  [97 %-98 %] 98 % (11/02 0545) Last BM Date: 11/09/14  Intake/Output from previous day: 11/01 0701 - 11/02 0700 In: 1522 [P.O.:222; I.V.:1200; IV Piggyback:100] Out: -  Intake/Output this shift:    PE: Abd: soft, NT, ND, +Bs Heart: regular LungS: CTAB  Lab Results:   Recent Labs  11/08/14 0439 11/10/14 0516  WBC 7.2 6.4  HGB 12.4 12.5  HCT 37.4 37.0  PLT 221 241   BMET  Recent Labs  11/08/14 1154 11/10/14 0516  NA 138 136  K 3.6 3.6  CL 101 102  CO2 21* 27  GLUCOSE 62* 95  BUN 9 <5*  CREATININE 0.67 0.58  CALCIUM 8.9 9.0   PT/INR No results for input(s): LABPROT, INR in the last 72 hours. CMP     Component Value Date/Time   NA 136 11/10/2014 0516   K 3.6 11/10/2014 0516   CL 102 11/10/2014 0516   CO2 27 11/10/2014 0516   GLUCOSE 95 11/10/2014 0516   BUN <5* 11/10/2014 0516   CREATININE 0.58 11/10/2014 0516   CALCIUM 9.0 11/10/2014 0516   PROT 6.8 11/10/2014 0516   ALBUMIN 3.3* 11/10/2014 0516   AST 28 11/10/2014 0516   ALT 20 11/10/2014 0516   ALKPHOS 85 11/10/2014 0516   BILITOT 0.8 11/10/2014 0516   GFRNONAA >60 11/10/2014 0516   GFRAA >60 11/10/2014 0516   Lipase  No results found for: LIPASE     Studies/Results: No results found.  Anti-infectives: Anti-infectives    Start     Dose/Rate Route Frequency Ordered Stop   11/09/14 0200  piperacillin-tazobactam (ZOSYN) IVPB 3.375 g     3.375 g 12.5 mL/hr over 240 Minutes Intravenous Every 8 hours 11/08/14 1828     11/07/14 2200  piperacillin-tazobactam  (ZOSYN) IVPB 3.375 g  Status:  Discontinued     3.375 g 12.5 mL/hr over 240 Minutes Intravenous 3 times per day 11/07/14 1651 11/08/14 1828   11/07/14 1545  piperacillin-tazobactam (ZOSYN) IVPB 3.375 g     3.375 g 100 mL/hr over 30 Minutes Intravenous  Once 11/07/14 1540 11/07/14 1634       Assessment/Plan  1. Diverticulitis with phlegmon -weak from diarrhea, but pain has resolved.  Tolerating clear liquids well.  It can be expected to have diarrhea associated with diverticulitis, so this is not unexpected.  Discussed this with the patient as well -advance to full liquids -cont abx therapy, but could likely start to transition towards oral abx therapy as well.  Since she is on zosyn, she could eventually switch to oral augmentin -she just had a colonoscopy 1 year ago that was otherwise unremarkable.  Do not think there is a need for another c-scope within 6 weeks of this episode given how recent her last c-scope was.   LOS: 3 days    [name] E 11/10/2014, 9:46 AM Pager: [phone]

## 2014-11-11 MED ORDER — SACCHAROMYCES BOULARDII 250 MG PO CAPS: 250.0000 mg | ORAL_CAPSULE | Freq: Two times a day (BID) | ORAL | Status: DC

## 2014-11-11 MED ORDER — LOPERAMIDE HCL 2 MG PO CAPS
2.0000 mg | ORAL_CAPSULE | Freq: Once | ORAL | Status: AC
  Administered 2014-11-11: 2 mg via ORAL
  Filled 2014-11-11: qty 1

## 2014-11-11 MED ORDER — AMOXICILLIN-POT CLAVULANATE 875-125 MG PO TABS
1.0000 | ORAL_TABLET | Freq: Two times a day (BID) | ORAL | Status: DC
  Administered 2014-11-11: 1 via ORAL
  Filled 2014-11-11: qty 1

## 2014-11-11 MED ORDER — AMOXICILLIN-POT CLAVULANATE 875-125 MG PO TABS: 1.0000 | ORAL_TABLET | Freq: Two times a day (BID) | ORAL | Status: DC

## 2014-11-11 NOTE — Progress Notes (Signed)
  Subjective: No n/v. No abd pain. Having some loose stool  Objective: Vital signs in last 24 hours: Temp:  [97.6 F (36.4 C)-98.1 F (36.7 C)] 98 F (36.7 C) (11/03 0522) Pulse Rate:  [73-77] 73 (11/03 0522) Resp:  [14-18] 14 (11/03 0522) BP: (142-154)/(56-76) 142/56 mmHg (11/03 0522) SpO2:  [98 %-100 %] 98 % (11/03 0522) Last BM Date: 11/10/14  Intake/Output from previous day: 11/02 0701 - 11/03 0700 In: 1326 [P.O.:476; I.V.:800; IV Piggyback:50] Out: -  Intake/Output this shift:    Alert, nad, nontoxic Soft, nt, nd,  Lab Results:   Recent Labs  11/10/14 0516  WBC 6.4  HGB 12.5  HCT 37.0  PLT 241   BMET  Recent Labs  11/08/14 1154 11/10/14 0516  NA 138 136  K 3.6 3.6  CL 101 102  CO2 21* 27  GLUCOSE 62* 95  BUN 9 <5*  CREATININE 0.67 0.58  CALCIUM 8.9 9.0   PT/INR No results for input(s): LABPROT, INR in the last 72 hours. ABG No results for input(s): PHART, HCO3 in the last 72 hours.  Invalid input(s): PCO2, PO2  Studies/Results: No results found.  Anti-infectives: Anti-infectives    Start     Dose/Rate Route Frequency Ordered Stop   11/11/14 1000  amoxicillin-clavulanate (AUGMENTIN) 875-125 MG per tablet 1 tablet     1 tablet Oral Every 12 hours 11/11/14 0659     11/09/14 0200  piperacillin-tazobactam (ZOSYN) IVPB 3.375 g  Status:  Discontinued     3.375 g 12.5 mL/hr over 240 Minutes Intravenous Every 8 hours 11/08/14 1828 11/11/14 0659   11/07/14 2200  piperacillin-tazobactam (ZOSYN) IVPB 3.375 g  Status:  Discontinued     3.375 g 12.5 mL/hr over 240 Minutes Intravenous 3 times per day 11/07/14 1651 11/08/14 1828   11/07/14 1545  piperacillin-tazobactam (ZOSYN) IVPB 3.375 g     3.375 g 100 mL/hr over 30 Minutes Intravenous  Once 11/07/14 1540 11/07/14 1634      Assessment/Plan: Sigmoid diverticulitis with phlegmon Loose stool not uncommon - C diff was negative Agree with probiotic. Have recommended eating some activia  yogurt Discussed dc instructions - what to call for Needs total of 2 weeks of abx (can substract out hospital course, so about 9-10 days) Low fiber diet for now Agree with outpt GI f/u Would recommend outpt f/u CT scan of abd/pelvis with contrast No fever, no tachycardia, no pain, tolerating solid food, bowels working, looks good - ok for dc from my opinion  Leighton Ruff. Redmond Pulling, MD, FACS General, Bariatric, & Minimally Invasive Surgery [facility], [place]    LOS: 4 days    [name] 11/11/2014

## 2014-11-11 NOTE — Care Management Note (Signed)
Case Management Note  Patient Details  Name: ERANDY MCEACHERN MRN: [id number] Date of Birth: 1940/02/07  Subjective/Objective:                  Independent patient from home admitted with diverticulitis. Will DC to home today.   Action/Plan:  DC to home today, no CM needs identified. Expected Discharge Date:  11/09/14               Expected Discharge Plan:  Home/Self Care  In-House Referral:     Discharge planning Services  CM Consult  Post Acute Care Choice:    Choice offered to:     DME Arranged:    DME Agency:     HH Arranged:    HH Agency:     Status of Service:  Completed, signed off  Medicare Important Message Given:  Yes-second notification given Date Medicare IM Given:    Medicare IM give by:    Date Additional Medicare IM Given:    Additional Medicare Important Message give by:     If discussed at [place] of Stay Meetings, dates discussed:    Additional Comments:  Carles Collet, RN 11/11/2014, 10:29 AM

## 2014-11-11 NOTE — Discharge Summary (Signed)
Physician Discharge Summary  Joan Mclaughlin LOV:[id number] DOB: 1940-12-12 DOA: 11/07/2014  PCP: [name], MD  Admit date: 11/07/2014 Discharge date: 11/11/2014  Time spent: < 30 minutes  Recommendations for Outpatient Follow-up:  1. Follow up with Dr. Ernie Hew in 4-6 weeks 2. Follow up with Dr. Redmond Pulling in 2 weeks 3. Follow up with Dr. Oletta Lamas in 1-2 months   Discharge Diagnoses:  Principal Problem:   Acute diverticulitis Active Problems:   Seizure disorder ([place])   Insomnia   Dehydration with hyponatremia   Blood in stool   Cholelithiasis  Discharge Condition: stable  Filed Weights   11/07/14 1128 11/07/14 1657  Weight: 62.097 kg (136 lb 14.4 oz) 62.098 kg (136 lb 14.4 oz)    History of present illness:  This is a 74 yo female patient history of seizure disorder seizure free since 2013 (diagnosed 2008), chronic insomnia, known diverticula diagnosed on colonoscopy "several years ago" who presents with abdominal pain and blood in stool. Patient reports that this past Friday she began having left lower quadrant abdominal pain and went to see her primary care physician who felt she had diverticulitis and started her empirically on Cipro and Flagyl. Abdominal pain increased on Friday but seemed improved on Saturday. By today she had noticed mucoid stools with blood and presented to the ER. Prior to arrival she had 2 stools with dark red blood and mucus that were small volume. She is not having any fevers chills or nausea and vomiting. In addition to the left lower quadrant discomfort she was also having a sensation of suprapubic fullness. In the ER she was afebrile, BP 110/58, pulse regular 83 respirations 16 room air sats 98%. Sodium was low at 130 as was chloride 93, otherwise electronic panel unremarkable, CBC unremarkable. Urinalysis appeared consistent with dehydration. Urine culture was obtained by the ER. CT of the abdomen and pelvis with contrast revealed acute sigmoid colon  diverticulitis with associated bowel wall thickening and pericolic inflammation and fluid stranding. It was also confluent area of edema/phlegmon along the undersurface of the involved segment of sigmoid colon that measures 3 cm at greatest dimension but no evidence of abscess formation and no free air. Follow-up CT was recommended by the radiologist.  Acute diverticulitis with phlegmon/Blood in stool - general surgery was consulted and have followed patient while hospitalized. Currently no fever or leukocytosis but patient was on Cipro and Flagyl prior to admission, now on zosyn. She was treated conservatively with NPO, IV antibiotics with clinical improvement. Her diet was able to be advanced to clears > fulls > low fiber without abdominal pain, nausea or vomiting. She was able to tolerate po antibiotics and she was discharged home in stable condition to complete 14 total days of antibiotics, with 10 days of Augmentin remaining.  Dehydration with hyponatremia - Continue IV fluid to diarrhea, sodium has improved Seizure disorder ([facility]) - On Trileptal prior to admission, seizure free per pt Cholelithiasis - Incidental finding on CT scan, Currently asymptomatic  Procedures:  None    Consultations:  General surgery   Discharge Exam: Filed Vitals:   11/10/14 1326 11/10/14 2224 11/10/14 2230 11/11/14 0522  BP: 146/56 154/66 152/76 142/56  Pulse: 73 77 75 73  Temp: 98.1 F (36.7 C) 97.6 F (36.4 C)  98 F (36.7 C)  TempSrc: Oral Oral  Oral  Resp:  18  14  Height:      Weight:      SpO2: 100% 100% 100% 98%   General: NAD  Cardiovascular: RRR Respiratory: CTA biL  Discharge Instructions Activity:  As tolerated   Get Medicines reviewed and adjusted: Please take all your medications with you for your next visit with your Primary MD  Please request your Primary MD to go over all hospital tests and procedure/radiological results at the follow up, please ask your Primary MD to get all  Hospital records sent to his/her office.  If you experience worsening of your admission symptoms, develop shortness of breath, life threatening emergency, suicidal or homicidal thoughts you must seek medical attention immediately by calling 911 or calling your MD immediately if symptoms less severe.  You must read complete instructions/literature along with all the possible adverse reactions/side effects for all the Medicines you take and that have been prescribed to you. Take any new Medicines after you have completely understood and accpet all the possible adverse reactions/side effects.   Do not drive when taking Pain medications.   Do not take more than prescribed Pain, Sleep and Anxiety Medications  Special Instructions: If you have smoked or chewed Tobacco in the last 2 yrs please stop smoking, stop any regular Alcohol and or any Recreational drug use.  Wear Seat belts while driving.  Please note  You were cared for by a hospitalist during your hospital stay. Once you are discharged, your primary care physician will handle any further medical issues. Please note that NO REFILLS for any discharge medications will be authorized once you are discharged, as it is imperative that you return to your primary care physician (or establish a relationship with a primary care physician if you do not have one) for your aftercare needs so that they can reassess your need for medications and monitor your lab values.    Medication List    STOP taking these medications        ciprofloxacin 500 MG tablet  Commonly known as:  CIPRO     metroNIDAZOLE 500 MG tablet  Commonly known as:  FLAGYL      TAKE these medications        ALPRAZolam 0.25 MG tablet  Commonly known as:  XANAX  Take 0.25 mg by mouth at bedtime as needed for anxiety.     amoxicillin-clavulanate 875-125 MG tablet  Commonly known as:  AUGMENTIN  Take 1 tablet by mouth every 12 (twelve) hours.     Biotin 10 MG Caps  Take  10 mg by mouth daily.     fexofenadine 180 MG tablet  Commonly known as:  ALLEGRA  Take 180 mg by mouth daily.     multivitamin with minerals tablet  Take 1 tablet by mouth daily.     Oxcarbazepine 300 MG tablet  Commonly known as:  TRILEPTAL  Take 1 tablet (300 mg total) by mouth 2 (two) times daily.     saccharomyces boulardii 250 MG capsule  Commonly known as:  FLORASTOR  Take 1 capsule (250 mg total) by mouth 2 (two) times daily.     valACYclovir 500 MG tablet  Commonly known as:  VALTREX  Take 500 mg by mouth as needed.     zolpidem 10 MG tablet  Commonly known as:  AMBIEN  Take 10 mg by mouth at bedtime as needed for sleep.           Follow-up Information    Follow up with [name], MD. Schedule an appointment as soon as possible for a visit in 2 weeks.   Specialty:  General Surgery   Why:  For  wound re-check// Office will call lpatient at home with an appointment   Contact information:   [place] [address] [phone]       Follow up with [facility], MD. Schedule an appointment as soon as possible for a visit on 12/09/2014.   Specialty:  Family Medicine   Why:  Appointment with Dr. Ernie Hew is on 12/09/14 at [place] information:   [address] [place] [address] [phone]       The results of significant diagnostics from this hospitalization (including imaging, microbiology, ancillary and laboratory) are listed below for reference.    Significant Diagnostic Studies: Ct Abdomen Pelvis W Contrast  11/07/2014  CLINICAL DATA:  Lower abdominal pain starting Friday night, rectal bleeding starting today. EXAM: CT ABDOMEN AND PELVIS WITH CONTRAST TECHNIQUE: Multidetector CT imaging of the abdomen and pelvis was performed using the standard protocol following bolus administration of intravenous contrast. CONTRAST:  152mL OMNIPAQUE IOHEXOL 300 MG/ML  SOLN COMPARISON:  None. FINDINGS: Lower chest:  No acute  findings. Hepatobiliary: Large stone within the fundus of the gallbladder. No gallbladder wall thickening, pericholecystic fluid or other secondary signs of acute cholecystitis. No bile duct dilatation. Liver appears normal. Pancreas: No mass, inflammatory changes, or other significant abnormality. Spleen: Within normal limits in size and appearance. Adrenals/Urinary Tract: No masses identified. No evidence of hydronephrosis. Stomach/Bowel: There is thickening of the walls of the lower sigmoid colon. Given the scattered diverticulosis within the sigmoid colon, findings likely represent acute diverticulitis. Confluent edema/phlegmon noted along the inferior margin of the involved segment of sigmoid colon without definite abscess. No associated bowel obstruction. Appendix is normal. Vascular/Lymphatic: Atherosclerotic changes of the normal-caliber abdominal aorta. No aortic aneurysm or dissection. Reproductive: Unremarkable. Other: No free intraperitoneal air. Musculoskeletal: Scattered degenerative changes within the thoracolumbar spine but no acute osseous abnormality. 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 IMPRESSION: 1. Acute sigmoid colon diverticulitis with associated bowel wall thickening and pericolic inflammation/fluid stranding. Confluent area of edema/phlegmon along the undersurface of the involved segment of the sigmoid colon measures approximately 3 cm greatest dimension, without evidence of abscess formation at this time, but follow-up CT is recommended to exclude early developing abscess. Would also recommend follow-up CT at some point to ensure resolution of today's findings and exclude the less likely possibility of this representing a neoplastic bowel wall thickening. 2. No evidence of bowel perforation seen. No free intraperitoneal air. 3. No bowel obstruction. 4. Cholelithiasis without evidence of acute cholecystitis. These results and  recommendation were called by telephone at the time of interpretation on 11/07/2014 at 3:21 pm to Dr. Waynetta Pean , who verbally acknowledged these results. Electronically Signed   By: Franki Cabot M.D.   On: 11/07/2014 15:22    Microbiology: Recent Results (from the past 240 hour(s))  Urine culture     Status: None   Collection Time: 11/07/14 12:41 PM  Result Value Ref Range Status   Specimen Description URINE, RANDOM  Final   Special Requests NONE  Final   Culture NO GROWTH 1 DAY  Final   Report Status 11/08/2014 FINAL  Final  C difficile quick scan w PCR reflex     Status: None   Collection Time: 11/09/14 10:31 AM  Result Value Ref Range Status   C Diff antigen NEGATIVE NEGATIVE Final   C Diff toxin NEGATIVE NEGATIVE Final   C Diff interpretation Negative for toxigenic C. difficile  Final  Stool culture     Status: None (Preliminary result)   Collection Time: 11/09/14 10:31 AM  Result Value Ref Range Status   Specimen Description STOOL  Final   Special Requests NONE  Final   Culture   Final    NO GROWTH 1 DAY Note: REDUCED NORMAL FLORA PRESENT Performed at [facility]    Report Status PENDING  Incomplete     Labs: Basic Metabolic Panel:  Recent Labs Lab 11/07/14 1252 11/08/14 0439 11/08/14 1154 11/10/14 0516  NA 130* 139 138 136  K 3.9 4.1 3.6 3.6  CL 93* 105 101 102  CO2 29 25 21* 27  GLUCOSE 97 74 62* 95  BUN 10 8 9  <5*  CREATININE 0.70 0.64 0.67 0.58  CALCIUM 9.0 8.9 8.9 9.0   Liver Function Tests:  Recent Labs Lab 11/07/14 1252 11/10/14 0516  AST 21 28  ALT 16 20  ALKPHOS 126 85  BILITOT 0.9 0.8  PROT 7.9 6.8  ALBUMIN 3.7 3.3*   CBC:  Recent Labs Lab 11/07/14 1252 11/08/14 0439 11/10/14 0516  WBC 10.5 7.2 6.4  HGB 13.9 12.4 12.5  HCT 40.6 37.4 37.0  MCV 89.0 89.7 88.7  PLT 254 221 241    Signed:  GHERGHE, COSTIN  [facility] 11/11/2014, 1:28 PM

## 2014-11-13 ENCOUNTER — Other Ambulatory Visit: Payer: Self-pay | Admitting: Family Medicine

## 2014-11-13 DIAGNOSIS — Z8719 Personal history of other diseases of the digestive system: Secondary | ICD-10-CM

## 2014-11-13 LAB — STOOL CULTURE: CULTURE: NO GROWTH

## 2014-11-19 ENCOUNTER — Other Ambulatory Visit: Payer: Medicare Other

## 2014-11-19 ENCOUNTER — Inpatient Hospital Stay: Admission: RE | Admit: 2014-11-19 | Payer: Medicare Other | Source: Ambulatory Visit

## 2014-11-22 ENCOUNTER — Ambulatory Visit
Admission: RE | Admit: 2014-11-22 | Discharge: 2014-11-22 | Disposition: A | Discharge status: Home / Self Care | Payer: Medicare Other | Source: Ambulatory Visit | Attending: Family Medicine | Admitting: Family Medicine

## 2014-11-22 DIAGNOSIS — Z8719 Personal history of other diseases of the digestive system: Secondary | ICD-10-CM

## 2014-11-22 MED ORDER — IOPAMIDOL (ISOVUE-300) INJECTION 61%
100.0000 mL | Freq: Once | INTRAVENOUS | Status: AC | PRN
  Administered 2014-11-22: 100 mL via INTRAVENOUS

## 2014-12-02 ENCOUNTER — Telehealth: Payer: Self-pay | Admitting: Surgery

## 2014-12-02 NOTE — Telephone Encounter (Signed)
Ms. Sabic saw Dr. Redmond Pulling for diverticular disease at [facility] for diverticulitis.  She was also noted to have gallstones which are large on her abdominal CT scan.  She was supposed to follow up with Dr. Redmond Pulling, but she decided not to do so.    She has had nausea for 3 weeks, which seems to be getting worse.  She has no fever or pain.  She has an appt to see Dr. Verne Grain on Monday, 11/28.  I told her to keep Dr. Siri Cole appt.  She can call our office to arrange an appt with Dr. Redmond Pulling.  If her symptoms worsen, she should go to the ER.  Alphonsa Overall, MD, [facility] Pager: [phone] Office phone:  [phone]

## 2014-12-13 ENCOUNTER — Ambulatory Visit: Payer: Self-pay | Admitting: General Surgery

## 2014-12-20 NOTE — Patient Instructions (Addendum)
YOUR PROCEDURE IS SCHEDULED ON :  12/23/14  REPORT TO [facility] MAIN ENTRANCE FOLLOW SIGNS TO EAST ELEVATOR - GO TO 3rd FLOOR CHECK IN AT 3 EAST NURSES STATION (SHORT STAY) AT:  1:15 PM  CALL THIS NUMBER IF YOU HAVE PROBLEMS THE MORNING OF SURGERY [phone]  REMEMBER:ONLY 1 PER PERSON MAY GO TO SHORT STAY WITH YOU TO GET READY THE MORNING OF YOUR SURGERY  DO NOT EAT FOOD  AFTER MIDNIGHT  MAY HAVE CLEAR LIQUIDS UNTIL 9:15 AM  TAKE THESE MEDICINES THE MORNING OF SURGERY: OXCARBAZEPINER  CLEAR LIQUID DIET  Foods Allowed                                                                     Foods Excluded  Coffee and tea, regular and decaf                             liquids that you cannot  Plain Jell-O in any flavor                                             see through such as: Fruit ices (not with fruit pulp)                                     milk, soups, orange juice  Iced Popsicles                                                       All solid food Carbonated beverages, regular and diet                                    Cranberry, grape and apple juices Sports drinks like Gatorade Lightly seasoned clear broth or consume(fat free) Sugar, honey syrup  _____________________________________________________________________    YOU MAY NOT HAVE ANY METAL ON YOUR BODY INCLUDING HAIR PINS AND PIERCING'S. DO NOT WEAR JEWELRY, MAKEUP, LOTIONS, POWDERS OR PERFUMES. DO NOT WEAR NAIL POLISH. DO NOT SHAVE 48 HRS PRIOR TO SURGERY. MEN MAY SHAVE FACE AND NECK.  DO NOT [facility] IS NOT RESPONSIBLE FOR VALUABLES.  CONTACTS, DENTURES OR PARTIALS MAY NOT BE WORN TO SURGERY. LEAVE SUITCASE IN CAR. CAN BE BROUGHT TO ROOM AFTER SURGERY.  PATIENTS DISCHARGED THE DAY OF SURGERY WILL NOT BE ALLOWED TO DRIVE HOME.  PLEASE READ OVER THE FOLLOWING INSTRUCTION  SHEETS _________________________________________________________________________________                                          [facility] - PREPARING FOR SURGERY  Before surgery, you can play an important role.  Because  skin is not sterile, your skin needs to be as free of germs as possible.  You can reduce the number of germs on your skin by washing with CHG (chlorahexidine gluconate) soap before surgery.  CHG is an antiseptic cleaner which kills germs and bonds with the skin to continue killing germs even after washing. Please DO NOT use if you have an allergy to CHG or antibacterial soaps.  If your skin becomes reddened/irritated stop using the CHG and inform your nurse when you arrive at Short Stay. Do not shave (including legs and underarms) for at least 48 hours prior to the first CHG shower.  You may shave your face. Please follow these instructions carefully:   1.  Shower with CHG Soap the night before surgery and the  morning of Surgery.   2.  If you choose to wash your hair, wash your hair first as usual with your  normal  Shampoo.   3.  After you shampoo, rinse your hair and body thoroughly to remove the  shampoo.                                         4.  Use CHG as you would any other liquid soap.  You can apply chg directly  to the skin and wash . Gently wash with scrungie or clean wascloth    5.  Apply the CHG Soap to your body ONLY FROM THE NECK DOWN.   Do not use on open                           Wound or open sores. Avoid contact with eyes, ears mouth and genitals (private parts).                        Genitals (private parts) with your normal soap.              6.  Wash thoroughly, paying special attention to the area where your surgery  will be performed.   7.  Thoroughly rinse your body with warm water from the neck down.   8.  DO NOT shower/wash with your normal soap after using and rinsing off  the CHG Soap .                9.  Pat yourself dry with a clean  towel.             10.  Wear clean night clothes to bed after shower             11.  Place clean sheets on your bed the night of your first shower and do not  sleep with pets.  Day of Surgery : Do not apply any lotions/deodorants the morning of surgery.  Please wear clean clothes to the hospital/surgery center.  FAILURE TO FOLLOW THESE INSTRUCTIONS MAY RESULT IN THE CANCELLATION OF YOUR SURGERY    PATIENT SIGNATURE_________________________________  ______________________________________________________________________

## 2014-12-21 ENCOUNTER — Encounter (INDEPENDENT_AMBULATORY_CARE_PROVIDER_SITE_OTHER): Payer: Self-pay

## 2014-12-21 ENCOUNTER — Encounter (HOSPITAL_COMMUNITY)
Admission: RE | Admit: 2014-12-21 | Discharge: 2014-12-21 | Disposition: A | Discharge status: Home / Self Care | Payer: Medicare Other | Source: Ambulatory Visit | Attending: General Surgery | Admitting: General Surgery

## 2014-12-21 ENCOUNTER — Encounter (HOSPITAL_COMMUNITY): Payer: Self-pay

## 2014-12-21 DIAGNOSIS — G40909 Epilepsy, unspecified, not intractable, without status epilepticus: Secondary | ICD-10-CM | POA: Diagnosis not present

## 2014-12-21 DIAGNOSIS — F419 Anxiety disorder, unspecified: Secondary | ICD-10-CM | POA: Diagnosis not present

## 2014-12-21 DIAGNOSIS — K5730 Diverticulosis of large intestine without perforation or abscess without bleeding: Secondary | ICD-10-CM | POA: Diagnosis not present

## 2014-12-21 DIAGNOSIS — Z79899 Other long term (current) drug therapy: Secondary | ICD-10-CM | POA: Diagnosis not present

## 2014-12-21 DIAGNOSIS — K8010 Calculus of gallbladder with chronic cholecystitis without obstruction: Secondary | ICD-10-CM | POA: Diagnosis not present

## 2014-12-21 DIAGNOSIS — K219 Gastro-esophageal reflux disease without esophagitis: Secondary | ICD-10-CM | POA: Diagnosis not present

## 2014-12-21 DIAGNOSIS — K8020 Calculus of gallbladder without cholecystitis without obstruction: Secondary | ICD-10-CM | POA: Diagnosis present

## 2014-12-21 HISTORY — DX: Hyperlipidemia, unspecified: E78.5

## 2014-12-21 HISTORY — DX: Nocturia: R35.1

## 2014-12-21 HISTORY — DX: Gastro-esophageal reflux disease without esophagitis: K21.9

## 2014-12-21 HISTORY — DX: Sleep disorder, unspecified: G47.9

## 2014-12-21 HISTORY — DX: Cardiac murmur, unspecified: R01.1

## 2014-12-21 HISTORY — DX: Cholecystitis, unspecified: K81.9

## 2014-12-21 LAB — CBC
HCT: 41.4 % (ref 36.0–46.0)
Hemoglobin: 14.1 g/dL (ref 12.0–15.0)
MCH: 30.3 pg (ref 26.0–34.0)
MCHC: 34.1 g/dL (ref 30.0–36.0)
MCV: 89 fL (ref 78.0–100.0)
PLATELETS: 243 10*3/uL (ref 150–400)
RBC: 4.65 MIL/uL (ref 3.87–5.11)
RDW: 13 % (ref 11.5–15.5)
WBC: 6.8 10*3/uL (ref 4.0–10.5)

## 2014-12-21 LAB — BASIC METABOLIC PANEL
Anion gap: 7 (ref 5–15)
BUN: 9 mg/dL (ref 6–20)
CO2: 28 mmol/L (ref 22–32)
CREATININE: 0.52 mg/dL (ref 0.44–1.00)
Calcium: 9.2 mg/dL (ref 8.9–10.3)
Chloride: 97 mmol/L — ABNORMAL LOW (ref 101–111)
GFR calc Af Amer: >60 mL/min (ref >60)
Glucose, Bld: 88 mg/dL (ref 65–99)
Potassium: 4.4 mmol/L (ref 3.5–5.1)
SODIUM: 132 mmol/L — AB (ref 135–145)

## 2014-12-23 ENCOUNTER — Ambulatory Visit (HOSPITAL_COMMUNITY): Payer: Medicare Other | Admitting: Anesthesiology

## 2014-12-23 ENCOUNTER — Observation Stay (HOSPITAL_COMMUNITY)
Admission: RE | Admit: 2014-12-23 | Discharge: 2014-12-24 | Disposition: A | Discharge status: Home / Self Care | Payer: Medicare Other | Source: Ambulatory Visit | Attending: General Surgery | Admitting: General Surgery

## 2014-12-23 ENCOUNTER — Ambulatory Visit (HOSPITAL_COMMUNITY): Payer: Medicare Other

## 2014-12-23 ENCOUNTER — Encounter (HOSPITAL_COMMUNITY): Payer: Self-pay | Admitting: *Deleted

## 2014-12-23 ENCOUNTER — Encounter (HOSPITAL_COMMUNITY)
Admission: RE | Disposition: A | Discharge status: Home / Self Care | Payer: Self-pay | Source: Ambulatory Visit | Attending: General Surgery

## 2014-12-23 DIAGNOSIS — Z419 Encounter for procedure for purposes other than remedying health state, unspecified: Secondary | ICD-10-CM

## 2014-12-23 DIAGNOSIS — G40909 Epilepsy, unspecified, not intractable, without status epilepticus: Secondary | ICD-10-CM | POA: Insufficient documentation

## 2014-12-23 DIAGNOSIS — K8010 Calculus of gallbladder with chronic cholecystitis without obstruction: Secondary | ICD-10-CM | POA: Diagnosis not present

## 2014-12-23 DIAGNOSIS — Z79899 Other long term (current) drug therapy: Secondary | ICD-10-CM | POA: Insufficient documentation

## 2014-12-23 DIAGNOSIS — K5730 Diverticulosis of large intestine without perforation or abscess without bleeding: Secondary | ICD-10-CM | POA: Insufficient documentation

## 2014-12-23 DIAGNOSIS — K219 Gastro-esophageal reflux disease without esophagitis: Secondary | ICD-10-CM | POA: Insufficient documentation

## 2014-12-23 DIAGNOSIS — Z9049 Acquired absence of other specified parts of digestive tract: Secondary | ICD-10-CM

## 2014-12-23 DIAGNOSIS — F419 Anxiety disorder, unspecified: Secondary | ICD-10-CM | POA: Insufficient documentation

## 2014-12-23 HISTORY — PX: CHOLECYSTECTOMY: SHX55

## 2014-12-23 HISTORY — DX: Acquired absence of other specified parts of digestive tract: Z90.49

## 2014-12-23 SURGERY — LAPAROSCOPIC CHOLECYSTECTOMY WITH INTRAOPERATIVE CHOLANGIOGRAM
Anesthesia: General | Site: Abdomen

## 2014-12-23 MED ORDER — DIPHENHYDRAMINE HCL 12.5 MG/5ML PO ELIX: 12.5000 mg | ORAL_SOLUTION | Freq: Four times a day (QID) | ORAL | Status: DC | PRN

## 2014-12-23 MED ORDER — ONDANSETRON 4 MG PO TBDP: 4.0000 mg | ORAL_TABLET | Freq: Four times a day (QID) | ORAL | Status: DC | PRN

## 2014-12-23 MED ORDER — KCL IN DEXTROSE-NACL 20-5-0.45 MEQ/L-%-% IV SOLN
INTRAVENOUS | Status: DC
  Administered 2014-12-23: 22:00:00 via INTRAVENOUS
  Filled 2014-12-23 (×2): qty 1000

## 2014-12-23 MED ORDER — LIDOCAINE HCL (CARDIAC) 20 MG/ML IV SOLN
INTRAVENOUS | Status: AC
  Filled 2014-12-23: qty 5

## 2014-12-23 MED ORDER — CHLORHEXIDINE GLUCONATE 4 % EX LIQD: 1.0000 "application " | Freq: Once | CUTANEOUS | Status: DC

## 2014-12-23 MED ORDER — PROPOFOL 10 MG/ML IV BOLUS
INTRAVENOUS | Status: AC
  Filled 2014-12-23: qty 20

## 2014-12-23 MED ORDER — LACTATED RINGERS IV SOLN
INTRAVENOUS | Status: DC
  Administered 2014-12-23: 1000 mL via INTRAVENOUS

## 2014-12-23 MED ORDER — ZOLPIDEM TARTRATE 5 MG PO TABS: 5.0000 mg | ORAL_TABLET | Freq: Every evening | ORAL | Status: DC | PRN

## 2014-12-23 MED ORDER — PANTOPRAZOLE SODIUM 40 MG IV SOLR
40.0000 mg | Freq: Every day | INTRAVENOUS | Status: DC
  Administered 2014-12-23: 40 mg via INTRAVENOUS
  Filled 2014-12-23 (×3): qty 40

## 2014-12-23 MED ORDER — SUGAMMADEX SODIUM 200 MG/2ML IV SOLN
INTRAVENOUS | Status: AC
  Filled 2014-12-23: qty 2

## 2014-12-23 MED ORDER — ONDANSETRON HCL 4 MG/2ML IJ SOLN
INTRAMUSCULAR | Status: DC | PRN
  Administered 2014-12-23 (×2): 2 mg via INTRAVENOUS

## 2014-12-23 MED ORDER — DIPHENHYDRAMINE HCL 50 MG/ML IJ SOLN: 12.5000 mg | Freq: Four times a day (QID) | INTRAMUSCULAR | Status: DC | PRN

## 2014-12-23 MED ORDER — LACTATED RINGERS IV SOLN
INTRAVENOUS | Status: DC | PRN
  Administered 2014-12-23 (×2): via INTRAVENOUS

## 2014-12-23 MED ORDER — HYDROMORPHONE HCL 1 MG/ML IJ SOLN
INTRAMUSCULAR | Status: AC
  Filled 2014-12-23: qty 1

## 2014-12-23 MED ORDER — CEFOTETAN DISODIUM-DEXTROSE 2-2.08 GM-% IV SOLR
2.0000 g | INTRAVENOUS | Status: AC
  Administered 2014-12-23: 2 g via INTRAVENOUS

## 2014-12-23 MED ORDER — SACCHAROMYCES BOULARDII 250 MG PO CAPS
250.0000 mg | ORAL_CAPSULE | Freq: Every day | ORAL | Status: DC
Start: 2014-12-23 — End: 2014-12-24
  Administered 2014-12-23 – 2014-12-24 (×2): 250 mg via ORAL
  Filled 2014-12-23 (×3): qty 1

## 2014-12-23 MED ORDER — FENTANYL CITRATE (PF) 100 MCG/2ML IJ SOLN
INTRAMUSCULAR | Status: DC | PRN
  Administered 2014-12-23: 50 ug via INTRAVENOUS
  Administered 2014-12-23: 100 ug via INTRAVENOUS
  Administered 2014-12-23 (×2): 50 ug via INTRAVENOUS

## 2014-12-23 MED ORDER — SUGAMMADEX SODIUM 500 MG/5ML IV SOLN
INTRAVENOUS | Status: DC | PRN
  Administered 2014-12-23: 200 mg via INTRAVENOUS

## 2014-12-23 MED ORDER — PROMETHAZINE HCL 25 MG/ML IJ SOLN: 12.5000 mg | Freq: Four times a day (QID) | INTRAMUSCULAR | Status: DC | PRN

## 2014-12-23 MED ORDER — DEXAMETHASONE SODIUM PHOSPHATE 10 MG/ML IJ SOLN
INTRAMUSCULAR | Status: DC | PRN
  Administered 2014-12-23: 10 mg via INTRAVENOUS

## 2014-12-23 MED ORDER — MIDAZOLAM HCL 5 MG/5ML IJ SOLN
INTRAMUSCULAR | Status: DC | PRN
  Administered 2014-12-23 (×2): 1 mg via INTRAVENOUS

## 2014-12-23 MED ORDER — ONDANSETRON HCL 4 MG/2ML IJ SOLN: 4.0000 mg | Freq: Four times a day (QID) | INTRAMUSCULAR | Status: DC | PRN

## 2014-12-23 MED ORDER — BUPIVACAINE-EPINEPHRINE (PF) 0.25% -1:200000 IJ SOLN
INTRAMUSCULAR | Status: AC
  Filled 2014-12-23: qty 30

## 2014-12-23 MED ORDER — ALPRAZOLAM 0.25 MG PO TABS
0.2500 mg | ORAL_TABLET | Freq: Every evening | ORAL | Status: DC | PRN
  Administered 2014-12-24: 0.25 mg via ORAL
  Filled 2014-12-23: qty 1

## 2014-12-23 MED ORDER — LIDOCAINE HCL (CARDIAC) 20 MG/ML IV SOLN
INTRAVENOUS | Status: DC | PRN
  Administered 2014-12-23: 75 mg via INTRAVENOUS

## 2014-12-23 MED ORDER — ACETAMINOPHEN 650 MG RE SUPP: 650.0000 mg | Freq: Four times a day (QID) | RECTAL | Status: DC | PRN

## 2014-12-23 MED ORDER — METHOCARBAMOL 500 MG PO TABS
500.0000 mg | ORAL_TABLET | Freq: Four times a day (QID) | ORAL | Status: DC | PRN
  Administered 2014-12-23: 500 mg via ORAL
  Filled 2014-12-23: qty 1

## 2014-12-23 MED ORDER — OXYCODONE HCL 5 MG PO TABS
5.0000 mg | ORAL_TABLET | ORAL | Status: DC | PRN
  Administered 2014-12-24 (×2): 10 mg via ORAL
  Filled 2014-12-23 (×2): qty 2

## 2014-12-23 MED ORDER — FENTANYL CITRATE (PF) 250 MCG/5ML IJ SOLN
INTRAMUSCULAR | Status: AC
  Filled 2014-12-23: qty 5

## 2014-12-23 MED ORDER — ROCURONIUM BROMIDE 100 MG/10ML IV SOLN
INTRAVENOUS | Status: DC | PRN
  Administered 2014-12-23: 30 mg via INTRAVENOUS
  Administered 2014-12-23: 10 mg via INTRAVENOUS

## 2014-12-23 MED ORDER — SIMETHICONE 80 MG PO CHEW
40.0000 mg | CHEWABLE_TABLET | Freq: Four times a day (QID) | ORAL | Status: DC | PRN
  Filled 2014-12-23: qty 1

## 2014-12-23 MED ORDER — HEPARIN SODIUM (PORCINE) 5000 UNIT/ML IJ SOLN
5000.0000 [IU] | Freq: Three times a day (TID) | INTRAMUSCULAR | Status: DC
  Administered 2014-12-24: 5000 [IU] via SUBCUTANEOUS
  Filled 2014-12-23 (×4): qty 1

## 2014-12-23 MED ORDER — ACETAMINOPHEN 325 MG PO TABS
650.0000 mg | ORAL_TABLET | Freq: Four times a day (QID) | ORAL | Status: DC | PRN
  Administered 2014-12-24: 650 mg via ORAL
  Filled 2014-12-23: qty 2

## 2014-12-23 MED ORDER — HYDROMORPHONE HCL 1 MG/ML IJ SOLN
0.2500 mg | INTRAMUSCULAR | Status: DC | PRN
  Administered 2014-12-23 (×2): 0.5 mg via INTRAVENOUS

## 2014-12-23 MED ORDER — SUGAMMADEX SODIUM 500 MG/5ML IV SOLN
INTRAVENOUS | Status: AC
  Filled 2014-12-23: qty 5

## 2014-12-23 MED ORDER — MORPHINE SULFATE (PF) 2 MG/ML IV SOLN
1.0000 mg | INTRAVENOUS | Status: DC | PRN
  Administered 2014-12-23 (×2): 2 mg via INTRAVENOUS
  Filled 2014-12-23 (×2): qty 1

## 2014-12-23 MED ORDER — MIDAZOLAM HCL 2 MG/2ML IJ SOLN
INTRAMUSCULAR | Status: AC
  Filled 2014-12-23: qty 2

## 2014-12-23 MED ORDER — ROCURONIUM BROMIDE 100 MG/10ML IV SOLN
INTRAVENOUS | Status: AC
  Filled 2014-12-23: qty 1

## 2014-12-23 MED ORDER — CEFOTETAN DISODIUM-DEXTROSE 2-2.08 GM-% IV SOLR
INTRAVENOUS | Status: AC
  Filled 2014-12-23: qty 50

## 2014-12-23 MED ORDER — BUPIVACAINE-EPINEPHRINE 0.25% -1:200000 IJ SOLN
INTRAMUSCULAR | Status: DC | PRN
  Administered 2014-12-23: 30 mL

## 2014-12-23 MED ORDER — ONDANSETRON HCL 4 MG/2ML IJ SOLN
INTRAMUSCULAR | Status: AC
  Filled 2014-12-23: qty 2

## 2014-12-23 MED ORDER — LACTATED RINGERS IR SOLN
Status: DC | PRN
  Administered 2014-12-23: 1000 mL

## 2014-12-23 MED ORDER — 0.9 % SODIUM CHLORIDE (POUR BTL) OPTIME
TOPICAL | Status: DC | PRN
  Administered 2014-12-23: 1000 mL

## 2014-12-23 MED ORDER — PROPOFOL 10 MG/ML IV BOLUS
INTRAVENOUS | Status: DC | PRN
  Administered 2014-12-23: 175 mg via INTRAVENOUS

## 2014-12-23 MED ORDER — IOHEXOL 300 MG/ML  SOLN
INTRAMUSCULAR | Status: DC | PRN
  Administered 2014-12-23: 4 mL

## 2014-12-23 MED ORDER — OXCARBAZEPINE 300 MG PO TABS
300.0000 mg | ORAL_TABLET | Freq: Two times a day (BID) | ORAL | Status: DC
  Administered 2014-12-23 – 2014-12-24 (×2): 300 mg via ORAL
  Filled 2014-12-23 (×4): qty 1

## 2014-12-23 MED ORDER — DEXAMETHASONE SODIUM PHOSPHATE 10 MG/ML IJ SOLN
INTRAMUSCULAR | Status: AC
  Filled 2014-12-23: qty 1

## 2014-12-23 MED ORDER — EPHEDRINE SULFATE 50 MG/ML IJ SOLN
INTRAMUSCULAR | Status: DC | PRN
  Administered 2014-12-23: 10 mg via INTRAVENOUS

## 2014-12-23 MED ORDER — LACTATED RINGERS IV SOLN: INTRAVENOUS | Status: DC

## 2014-12-23 MED ORDER — SUCCINYLCHOLINE CHLORIDE 20 MG/ML IJ SOLN
INTRAMUSCULAR | Status: DC | PRN
  Administered 2014-12-23: 100 mg via INTRAVENOUS

## 2014-12-23 SURGICAL SUPPLY — 44 items
APPLIER CLIP 5 13 M/L LIGAMAX5 (MISCELLANEOUS) ×3
APPLIER CLIP ROT 10 11.4 M/L (STAPLE)
CABLE HIGH FREQUENCY MONO STRZ (ELECTRODE) ×3 IMPLANT
CHLORAPREP W/TINT 26ML (MISCELLANEOUS) ×3 IMPLANT
CLIP APPLIE 5 13 M/L LIGAMAX5 (MISCELLANEOUS) ×1 IMPLANT
CLIP APPLIE ROT 10 11.4 M/L (STAPLE) IMPLANT
COVER MAYO STAND STRL (DRAPES) ×3 IMPLANT
COVER SURGICAL LIGHT HANDLE (MISCELLANEOUS) ×3 IMPLANT
DECANTER SPIKE VIAL GLASS SM (MISCELLANEOUS) ×3 IMPLANT
DERMABOND ADVANCED (GAUZE/BANDAGES/DRESSINGS) ×2
DERMABOND ADVANCED .7 DNX12 (GAUZE/BANDAGES/DRESSINGS) ×1 IMPLANT
DRAPE C-ARM 42X120 X-RAY (DRAPES) IMPLANT
DRAPE LAPAROSCOPIC ABDOMINAL (DRAPES) ×3 IMPLANT
ELECT REM PT RETURN 9FT ADLT (ELECTROSURGICAL) ×3
ELECTRODE REM PT RTRN 9FT ADLT (ELECTROSURGICAL) ×1 IMPLANT
GLOVE BIOGEL M STRL SZ7.5 (GLOVE) ×3 IMPLANT
GLOVE BIOGEL PI IND STRL 6.5 (GLOVE) ×1 IMPLANT
GLOVE BIOGEL PI IND STRL 7.0 (GLOVE) ×1 IMPLANT
GLOVE BIOGEL PI IND STRL 7.5 (GLOVE) ×1 IMPLANT
GLOVE BIOGEL PI IND STRL 8 (GLOVE) ×1 IMPLANT
GLOVE BIOGEL PI INDICATOR 6.5 (GLOVE) ×2
GLOVE BIOGEL PI INDICATOR 7.0 (GLOVE) ×2
GLOVE BIOGEL PI INDICATOR 7.5 (GLOVE) ×2
GLOVE BIOGEL PI INDICATOR 8 (GLOVE) ×2
GOWN STRL REUS W/TWL XL LVL3 (GOWN DISPOSABLE) ×9 IMPLANT
HEMOSTAT SNOW SURGICEL 2X4 (HEMOSTASIS) IMPLANT
KIT BASIN OR (CUSTOM PROCEDURE TRAY) ×3 IMPLANT
NS IRRIG 1000ML POUR BTL (IV SOLUTION) ×3 IMPLANT
PEN SKIN MARKING BROAD (MISCELLANEOUS) ×3 IMPLANT
POUCH RETRIEVAL ECOSAC 10 (ENDOMECHANICALS) IMPLANT
POUCH RETRIEVAL ECOSAC 10MM (ENDOMECHANICALS)
POUCH SPECIMEN RETRIEVAL 10MM (ENDOMECHANICALS) IMPLANT
SCISSORS LAP 5X35 DISP (ENDOMECHANICALS) ×3 IMPLANT
SET CHOLANGIOGRAPH MIX (MISCELLANEOUS) IMPLANT
SET IRRIG TUBING LAPAROSCOPIC (IRRIGATION / IRRIGATOR) ×3 IMPLANT
SLEEVE XCEL OPT CAN 5 100 (ENDOMECHANICALS) ×6 IMPLANT
SUT MNCRL AB 4-0 PS2 18 (SUTURE) ×3 IMPLANT
SUT VICRYL 0 UR6 27IN ABS (SUTURE) ×3 IMPLANT
TOWEL OR 17X26 10 PK STRL BLUE (TOWEL DISPOSABLE) ×3 IMPLANT
TOWEL OR NON WOVEN STRL DISP B (DISPOSABLE) ×3 IMPLANT
TRAY LAPAROSCOPIC (CUSTOM PROCEDURE TRAY) ×3 IMPLANT
TROCAR BLADELESS OPT 5 100 (ENDOMECHANICALS) ×3 IMPLANT
TROCAR XCEL BLUNT TIP 100MML (ENDOMECHANICALS) ×3 IMPLANT
TROCAR XCEL NON-BLD 11X100MML (ENDOMECHANICALS) IMPLANT

## 2014-12-23 NOTE — Transfer of Care (Signed)
Immediate Anesthesia Transfer of Care Note  Patient: Joan Mclaughlin  Procedure(s) Performed: Procedure(s): LAPAROSCOPIC CHOLECYSTECTOMY WITH INTRAOPERATIVE CHOLANGIOGRAM (N/A)  Patient Location: PACU  Anesthesia Type:General  Level of Consciousness:  sedated, patient cooperative and responds to stimulation  Airway & Oxygen Therapy:Patient Spontanous Breathing and Patient connected to face mask oxgen  Post-op Assessment:  Report given to PACU RN and Post -op Vital signs reviewed and stable  Post vital signs:  Reviewed and stable  Last Vitals:  Filed Vitals:   12/23/14 1320  BP: 149/74  Pulse: 75  Temp: 36.9 C  Resp: 18    Complications: No apparent anesthesia complications

## 2014-12-23 NOTE — Anesthesia Preprocedure Evaluation (Signed)
Anesthesia Evaluation  Patient identified by MRN, date of birth, ID band Patient awake    Reviewed: Allergy & Precautions, H&P , NPO status , Patient's Chart, lab work & pertinent test results  Airway Mallampati: II  TM Distance: >3 FB Neck ROM: full    Dental no notable dental hx. (+) Dental Advisory Given, Teeth Intact   Pulmonary neg pulmonary ROS,    Pulmonary exam normal breath sounds clear to auscultation       Cardiovascular Exercise Tolerance: Good negative cardio ROS Normal cardiovascular exam Rhythm:regular Rate:Normal     Neuro/Psych Seizures -,  negative neurological ROS  negative psych ROS   GI/Hepatic negative GI ROS, Neg liver ROS, GERD  ,  Endo/Other  negative endocrine ROS  Renal/GU negative Renal ROS  negative genitourinary   Musculoskeletal   Abdominal   Peds  Hematology negative hematology ROS (+)   Anesthesia Other Findings   Reproductive/Obstetrics negative OB ROS                             Anesthesia Physical Anesthesia Plan  ASA: III  Anesthesia Plan: General   Post-op Pain Management:    Induction: Intravenous  Airway Management Planned: Oral ETT  Additional Equipment:   Intra-op Plan:   Post-operative Plan: Extubation in OR  Informed Consent: I have reviewed the patients History and Physical, chart, labs and discussed the procedure including the risks, benefits and alternatives for the proposed anesthesia with the patient or authorized representative who has indicated his/her understanding and acceptance.   Dental Advisory Given  Plan Discussed with: CRNA and Surgeon  Anesthesia Plan Comments:         Anesthesia Quick Evaluation

## 2014-12-23 NOTE — Op Note (Signed)
LEALA OLIVE GA:[id number] Oct 17, 1940 12/23/2014  Laparoscopic Cholecystectomy with IOC Procedure Note  Indications: This patient presents with symptomatic gallbladder disease and will undergo laparoscopic cholecystectomy. Please see my note for additional details regarding indications for cholecystectomy  Pre-operative Diagnosis: Large gallstone, nausea, upper abdominal discomfort  Post-operative Diagnosis: Same  Surgeon: [name]   Assistants: none  Anesthesia: General endotracheal anesthesia  ASA Class: 3  Procedure Details  The patient was seen again in the Holding Room. The risks, benefits, complications, treatment options, and expected outcomes were discussed with the patient. The possibilities of reaction to medication, pulmonary aspiration, perforation of viscus, bleeding, recurrent infection, finding a normal gallbladder, the need for additional procedures, failure to diagnose a condition, the possible need to convert to an open procedure, and creating a complication requiring transfusion or operation were discussed with the patient. The likelihood of improving the patient's symptoms with return to their baseline status is good.  The patient and/or family concurred with the proposed plan, giving informed consent. The site of surgery properly noted. The patient was taken to Operating Room, identified as TAKIAH VANDENBERG and the procedure verified as Laparoscopic Cholecystectomy with Intraoperative Cholangiogram. A Time Out was held and the above information confirmed. Antibiotic prophylaxis was administered.   Prior to the induction of general anesthesia, antibiotic prophylaxis was administered. General endotracheal anesthesia was then administered and tolerated well. After the induction, the abdomen was prepped with Chloraprep and draped in the sterile fashion. The patient was positioned in the supine position.  Local anesthetic agent was injected into the skin near the umbilicus  and an incision made. We dissected down to the abdominal fascia with blunt dissection.  The fascia was incised vertically and we entered the peritoneal cavity bluntly.  A pursestring suture of 0-Vicryl was placed around the fascial opening.  The Hasson cannula was inserted and secured with the stay suture.  Pneumoperitoneum was then created with CO2 and tolerated well without any adverse changes in the patient's vital signs. An 5-mm port was placed in the subxiphoid position.  Two 5-mm ports were placed in the right upper quadrant. All skin incisions were infiltrated with a local anesthetic agent before making the incision and placing the trocars.   We positioned the patient in reverse Trendelenburg, tilted slightly to the patient's left.  The gallbladder was identified, the fundus grasped and retracted cephalad. She had a large gallstone in the body of the gallbladder.  Adhesions were lysed bluntly and with the electrocautery where indicated, taking care not to injure any adjacent organs or viscus. The infundibulum was grasped and retracted laterally, exposing the peritoneum overlying the triangle of Calot. This was then divided and exposed in a blunt fashion. A critical view of the cystic duct and cystic artery was obtained.  The cystic duct was clearly identified and bluntly dissected circumferentially. The cystic duct was ligated with a clip distally.   An incision was made in the cystic duct and the [facility] cholangiogram catheter introduced. The catheter was secured using a clip. A cholangiogram was then obtained which showed good visualization of the distal and proximal biliary tree with no sign of filling defects or obstruction.  Contrast flowed into the duodenum with a little bit of pressure. The catheter was then removed.   The cystic duct was then ligated with clips and divided. The cystic artery which had been identified & dissected free was ligated with clips and divided as well.   The gallbladder was  dissected from the liver  bed in retrograde fashion with the electrocautery. A small posterior branch of the cystic artery was clipped as we started to mobilize the gallbladder from the gallbladder fossa. The gallbladder was removed and placed in an Ecco sac.  The gallbladder and Ecco sac were then removed through the umbilical port site after stretching the fascia with a kelley due to the very large gallstone. The liver bed was irrigated and inspected. Hemostasis was achieved with the electrocautery. Copious irrigation was utilized and was repeatedly aspirated until clear.  The pursestring suture was used to close the umbilical fascia.  However there was an air leak as well as some obvious fascial separation. I therefore placed 3 additional interrupted 0 Vicryl sutures to close the umbilical fascia.  We again inspected the right upper quadrant for hemostasis.  The umbilical closure was inspected and there was no air leak and nothing trapped within the closure. Pneumoperitoneum was released as we removed the trocars.  4-0 Monocryl was used to close the skin.   Dermabond was applied. The patient was then extubated and brought to the recovery room in stable condition. Instrument, sponge, and needle counts were correct at closure and at the conclusion of the case.   Findings: Cholelithiasis; positive critical view, normal cholangiogram, sigmoid colon appeared grossly normal  Estimated Blood Loss: Minimal         Drains: none         Specimens: Gallbladder           Complications: None; patient tolerated the procedure well.         Disposition: PACU - hemodynamically stable.         Condition: stable  Leighton Ruff. Redmond Pulling, MD, FACS General, Bariatric, & Minimally Invasive Surgery [facility], [place]

## 2014-12-23 NOTE — Anesthesia Procedure Notes (Signed)
Procedure Name: Intubation Date/Time: 12/23/2014 4:10 PM Performed by: Ofilia Neas Pre-anesthesia Checklist: Patient identified, Emergency Drugs available, Suction available and Patient being monitored Patient Re-evaluated:Patient Re-evaluated prior to inductionOxygen Delivery Method: Circle system utilized Preoxygenation: Pre-oxygenation with 100% oxygen Intubation Type: IV induction and Cricoid Pressure applied Ventilation: Mask ventilation without difficulty Laryngoscope Size: Mac and 3 Grade View: Grade II Tube type: Oral Tube size: 7.5 mm Number of attempts: 1 Airway Equipment and Method: Stylet Secured at: 21 cm Tube secured with: Tape Dental Injury: Teeth and Oropharynx as per pre-operative assessment  Comments: Anterior.  Cords visualized with cricoid press/headlift

## 2014-12-23 NOTE — Discharge Instructions (Signed)
[facility] °LAPAROSCOPIC SURGERY: POST OP INSTRUCTIONS °Always review your discharge instruction sheet given to you by the facility where your surgery was performed. °IF YOU HAVE DISABILITY OR FAMILY LEAVE FORMS, YOU MUST BRING THEM TO THE OFFICE FOR PROCESSING.   °DO NOT GIVE THEM TO YOUR DOCTOR. ° °1. A prescription for pain medication may be given to you upon discharge.  Take your pain medication as prescribed, if needed.  If narcotic pain medicine is not needed, then you may take acetaminophen (Tylenol) or ibuprofen (Advil) as needed. °2. Take your usually prescribed medications unless otherwise directed. °3. If you need a refill on your pain medication, please contact your pharmacy.  They will contact our office to request authorization. Prescriptions will not be filled after 5pm or on week-ends. °4. You should follow a light diet the first few days after arrival home, such as soup and crackers, etc.  Be sure to include lots of fluids daily. °5. Most patients will experience some swelling and bruising in the area of the incisions.  Ice packs will help.  Swelling and bruising can take several days to resolve.  °6. It is common to experience some constipation if taking pain medication after surgery.  Increasing fluid intake and taking a stool softener (such as Colace) will usually help or prevent this problem from occurring.  A mild laxative (Milk of Magnesia or Miralax) should be taken according to package instructions if there are no bowel movements after 48 hours. °7. If your surgeon used skin glue on the incision, you may shower in 24 hours.  The glue will flake off over the next 2-3 weeks.  Any sutures or staples will be removed at the office during your follow-up visit. °8. ACTIVITIES:  You may resume regular (light) daily activities beginning the next day--such as daily self-care, walking, climbing stairs--gradually increasing activities as tolerated.  You may have sexual  intercourse when it is comfortable.  Refrain from any heavy lifting or straining until approved by your doctor. °a. You may drive when you are no longer taking prescription pain medication, you can comfortably wear a seatbelt, and you can safely maneuver your car and apply brakes. °9. You should see your doctor in the office for a follow-up appointment approximately 2-3 weeks after your surgery.  Make sure that you call for this appointment within a day or two after you arrive home to insure a convenient appointment time. °10. OTHER INSTRUCTIONS:  °WHEN TO CALL YOUR DOCTOR: °1. Fever over 101.0 °2. Inability to urinate °3. Continued bleeding from incision. °4. Increased pain, redness, or drainage from the incision. °5. Increasing abdominal pain ° °The clinic staff is available to answer your questions during regular business hours.  Please don’t hesitate to call and ask to speak to one of the nurses for clinical concerns.  If you have a medical emergency, go to the nearest emergency room or call 911.  A surgeon from [facility] is always on call at the hospital. °[address], [address], [place], [place]  [address] ? [address] [address], , [place]   [address] °[phone] ? [phone] ? FAX [phone] °Web site: [email or web address] ° °

## 2014-12-23 NOTE — Anesthesia Postprocedure Evaluation (Signed)
Anesthesia Post Note  Patient: Joan Mclaughlin  Procedure(s) Performed: Procedure(s) (LRB): LAPAROSCOPIC CHOLECYSTECTOMY WITH INTRAOPERATIVE CHOLANGIOGRAM (N/A)  Patient location during evaluation: PACU Anesthesia Type: General Level of consciousness: awake and alert Pain management: pain level controlled Vital Signs Assessment: post-procedure vital signs reviewed and stable Respiratory status: spontaneous breathing, nonlabored ventilation, respiratory function stable and patient connected to nasal cannula oxygen Cardiovascular status: blood pressure returned to baseline and stable Postop Assessment: no signs of nausea or vomiting Anesthetic complications: no    Last Vitals:  Filed Vitals:   12/23/14 1820 12/23/14 1920  BP: 174/67 166/60  Pulse: 69 68  Temp: 36.4 C 36.6 C  Resp: 12 14    Last Pain:  Filed Vitals:   12/23/14 1923  PainSc: 0-No pain                 Tyrah Broers L

## 2014-12-23 NOTE — Interval H&P Note (Signed)
History and Physical Interval Note:  12/23/2014 3:27 PM  Joan Mclaughlin  has presented today for surgery, with the diagnosis of symptomatic cholelithiasis  The various methods of treatment have been discussed with the patient and family. After consideration of risks, benefits and other options for treatment, the patient has consented to  Procedure(s): LAPAROSCOPIC CHOLECYSTECTOMY WITH INTRAOPERATIVE CHOLANGIOGRAM (N/A) as a surgical intervention .  The patient's history has been reviewed, patient examined, no change in status, stable for surgery.  I have reviewed the patient's chart and labs.  Questions were answered to the patient's satisfaction.    Has been feeling better over past several days but still taking low dose phenergan tid. No lower abd pain, f/c/v. All questions asked/answered  Joan Ruff. Redmond Pulling, MD, [place], Bariatric, & Minimally Invasive Surgery [facility], [place]   [facility]

## 2014-12-23 NOTE — H&P (Signed)
Joan Mclaughlin 12/09/2014 4:00 PM Location: [facility] Patient #: [id number] DOB: 09-24-1940 Divorced / Language: [name] / Race: White Female  History of Present Illness [name] M. Gowri Suchan MD; 12/23/2014 1:38 PM) The patient is a 74 year old female who presents with diverticulitis. She comes in for follow-up regarding her recent hospitalization from October 30 through November 3 for sigmoid diverticulitis. She was medically managed. She had a small phlegmon however there was no sign of definitive abscess. She states that she has been doing a lot better. However she still has some mild issues. She had a follow-up CT scan on November 14 which showed resolution of sigmoid diverticulitis without any evidence of abscess or other complication. She still had radiological evidence of a large gallstone measuring 2.6 cm without any evidence of inflammation or biliary ductal dilatation. She states her main issue is some constipation and nausea. She has no lower abdominal pain. When she developed the constipation and the irregular bowel movements she started taking MiraLAX. She has been trying to eat some high fiber foods as well. She is followed up with Dr. Oletta Lamas in gastroenterology. She denies any fevers or chills. She also complains of some mild bilateral under rib discomfort which she describes as a rubber band sensation. She denies any melena or hematochezia. She denies any difficulty urinating. She states her appetite is getting better however the intermittent nausea is a factor.   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) NAUSEA (R11.0)  Other Problems [name], MD; 12/23/2014 1:42 PM) Anxiety Disorder Back Pain Cholelithiasis Diverticulosis Gastroesophageal Reflux Disease Seizure Disorder  Past Surgical History [name], [place]; 12/09/2014 3:53 PM) Spinal  Surgery Midback  Diagnostic Studies History [name], CMA; 12/09/2014 3:53 PM) Colonoscopy within last year Mammogram 1-3 years ago Pap Smear >5 years ago  Allergies [name], [name]; 12/09/2014 3:53 PM) No Known Drug Allergies 12/09/2014  Medication History [name], MD; 12/23/2014 1:42 PM) ALPRAZolam (0.25MG  Tablet, Oral) Active. OXcarbazepine (300MG  Tablet, Oral) Active. ValACYclovir HCl (1GM Tablet, Oral) Active. Zolpidem Tartrate (10MG  Tablet, Oral) Active. Fexofenadine HCl (180MG  Tablet, Oral) Active. Multiple Vitamins (Oral) Active. Florastor (250MG  Capsule, Oral) Active. Biotin (10MG  Tablet, Oral) Active. Medications Reconciled Promethazine HCl (12.5MG  Tablet, 1 (one) Tablet Oral every eight hours, as needed, Taken starting 12/15/2014) Active.  Social History [name], [place]; 12/09/2014 3:53 PM) Alcohol use Remotely quit alcohol use. Caffeine use Coffee. No drug use Tobacco use Never smoker.  Family History [name], [place]; 12/09/2014 3:53 PM) Alcohol Abuse Mother. Arthritis Father. Heart Disease Mother. Hypertension Mother. Respiratory Condition Mother.  Pregnancy / Birth History [name], [place]; 12/09/2014 3:53 PM) Age at menarche 30 years. Age of menopause 56-55 Gravida 73 Maternal age 68-25 Para 4 Regular periods     Review of Systems [name] CMA; 12/09/2014 3:53 PM) General Present- Appetite Loss. Not Present- Chills, Fatigue, Fever, Night Sweats, Weight Gain and Weight Loss. Skin Not Present- Change in Wart/Mole, Dryness, Hives, Jaundice, New Lesions, Non-Healing Wounds, Rash and Ulcer. HEENT Not Present- Earache, Hearing Loss, Hoarseness, Nose Bleed, Oral Ulcers, Ringing in the Ears, Seasonal Allergies, Sinus Pain, Sore Throat, Visual Disturbances, Wears glasses/contact lenses and Yellow Eyes. Respiratory Not Present- Bloody sputum, Chronic Cough, Difficulty Breathing, Snoring and Wheezing. Breast Not  Present- Breast Mass, Breast Pain, Nipple Discharge and Skin Changes. Cardiovascular Present- Leg Cramps. Not Present- Chest Pain, Difficulty Breathing Lying Down, Palpitations, Rapid Heart Rate,  Shortness of Breath and Swelling of Extremities. Gastrointestinal Present- Change in Bowel Habits, Constipation and Nausea. Not Present- Abdominal Pain, Bloating, Bloody Stool, Chronic diarrhea, Difficulty Swallowing, Excessive gas, Gets full quickly at meals, Hemorrhoids, Indigestion, Rectal Pain and Vomiting. Female Genitourinary Not Present- Frequency, Nocturia, Painful Urination, Pelvic Pain and Urgency. Musculoskeletal Present- Back Pain. Not Present- Joint Pain, Joint Stiffness, Muscle Pain, Muscle Weakness and Swelling of Extremities. Neurological Present- Seizures. Not Present- Decreased Memory, Fainting, Headaches, Numbness, Tingling, Tremor, Trouble walking and Weakness. Hematology Present- Easy Bruising. Not Present- Excessive bleeding, Gland problems, HIV and Persistent Infections.  Vitals [name] CMA; 12/09/2014 3:55 PM) 12/09/2014 3:55 PM Weight: 133.8 lb Height: 62in Body Surface Area: 1.61 m Body Mass Index: 24.47 kg/m  Temp.: 97.45F(Temporal)  Pulse: 62 (Regular)  BP: 126/72 (Sitting, Left Arm, Standard)      Physical Exam [name] M. Elisavet Buehrer MD; 12/23/2014 1:34 PM)  General Mental Status-Alert. General Appearance-Consistent with stated age. Hydration-Well hydrated. Voice-Normal.  Head and Neck Head-normocephalic, atraumatic with no lesions or palpable masses. Trachea-midline. Thyroid Gland Characteristics - normal size and consistency.  Eye Eyeball - Bilateral-Extraocular movements intact. Sclera/Conjunctiva - Bilateral-No scleral icterus.  Chest and Lung Exam Chest and lung exam reveals -quiet, even and easy respiratory effort with no use of accessory muscles and on auscultation, normal breath sounds, no adventitious sounds and normal  vocal resonance. Inspection Chest Wall - Normal. Back - normal.  Breast - Did not examine.  Cardiovascular Cardiovascular examination reveals -normal heart sounds, regular rate and rhythm with no murmurs and normal pedal pulses bilaterally.  Abdomen Inspection Inspection of the abdomen reveals - No Hernias. Skin - Scar - no surgical scars. Palpation/Percussion Palpation and Percussion of the abdomen reveal - Soft, Non Tender, No Rebound tenderness, No Rigidity (guarding) and No hepatosplenomegaly. Auscultation Auscultation of the abdomen reveals - Bowel sounds normal.  Peripheral Vascular Upper Extremity Palpation - Pulses bilaterally normal.  Neurologic Neurologic evaluation reveals -alert and oriented x 3 with no impairment of recent or remote memory. Mental Status-Normal.  Neuropsychiatric The patient's mood and affect are described as -normal. Judgment and Insight-insight is appropriate concerning matters relevant to self.  Musculoskeletal Normal Exam - Left-Upper Extremity Strength Normal and Lower Extremity Strength Normal. Normal Exam - Right-Upper Extremity Strength Normal and Lower Extremity Strength Normal.  Lymphatic Head & Neck  General Head & Neck Lymphatics: Bilateral - Description - Normal. Axillary - Did not examine. Femoral & Inguinal - Did not examine.    Assessment & Plan [name] M. Dejia Ebron MD; 12/23/2014 1:42 PM)  OTHER CHOLELITHIASIS (K80.80) Impression: We had a prolonged discussion about her nausea and her large gallstone. It's hard to say if her nausea is attributable to her gallstone. The fact that she does endorse some bilateral upper abdominal discomfort underneath her rib cage like a rubber band sensation is suggestive of a gallbladder issue. There is no other abnormality on her CT scan. Irregardless of whether or not her nausea is due to biliary issues, we discussed that the size of her gallstone alone is indication for  cholecystectomy. We discussed the rare association between large gallstones and gallbladder cancer. I believe the patient's symptoms are consistent with gallbladder disease.  We discussed gallbladder disease. The patient was given Neurosurgeon. We discussed the signs & symptoms of acute cholecystitis  I discussed laparoscopic cholecystectomy with IOC in detail. The patient was given educational material as well as diagrams detailing the procedure. We discussed the risks and benefits of a laparoscopic cholecystectomy including,  but not limited to bleeding, infection, injury to surrounding structures such as the intestine or liver, bile leak, retained gallstones, need to convert to an open procedure, prolonged diarrhea, blood clots such as DVT, common bile duct injury, anesthesia risks, and possible need for additional procedures. We discussed the typical post-operative recovery course. I explained that the likelihood of improvement of their symptoms is fair. I explained That I could not guarantee that cholecystectomy would ameliorate her nausea.  The patient has elected to think about the procedure. In the interim we are going to put her on anti-emetic and see how she does. She is going to think about cholecystectomy and call us back and let us know how she would like to proceed  Current Plans Pt Education - Pamphlet Given - Laparoscopic Gallbladder Surgery: discussed with patient and provided information. 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 She is not having any lower abdominal pain or discomfort. I do not think her nausea is attributable to her diverticulosis. I agree with slowly adopting a high-fiber diet.  Current Plans Pt Education - EMW_constipation  Leighton Ruff. Redmond Pulling, MD, FACS General, Bariatric, & Minimally Invasive Surgery [facility],  [place]

## 2014-12-24 ENCOUNTER — Encounter (HOSPITAL_COMMUNITY): Payer: Self-pay | Admitting: General Surgery

## 2014-12-24 DIAGNOSIS — K8010 Calculus of gallbladder with chronic cholecystitis without obstruction: Secondary | ICD-10-CM | POA: Diagnosis not present

## 2014-12-24 LAB — CBC
HEMATOCRIT: 37.6 % (ref 36.0–46.0)
HEMOGLOBIN: 12.8 g/dL (ref 12.0–15.0)
MCH: 30.6 pg (ref 26.0–34.0)
MCHC: 34 g/dL (ref 30.0–36.0)
MCV: 90 fL (ref 78.0–100.0)
PLATELETS: 229 10*3/uL (ref 150–400)
RBC: 4.18 MIL/uL (ref 3.87–5.11)
RDW: 13.1 % (ref 11.5–15.5)
WBC: 5.9 10*3/uL (ref 4.0–10.5)

## 2014-12-24 MED ORDER — OXYCODONE HCL 5 MG PO TABS: 5.0000 mg | ORAL_TABLET | ORAL | Status: DC | PRN

## 2014-12-24 NOTE — Discharge Summary (Signed)
Physician Discharge Summary  Joan Mclaughlin [id number] DOB: 20-Mar-1940 DOA: 12/23/2014  PCP: Antony Blackbird, MD  Admit date: 12/23/2014 Discharge date: 12/24/2014  Recommendations for Outpatient Follow-up:   Follow-up Information    Follow up with [name], MD. Schedule an appointment as soon as possible for a visit in 3 weeks.   Specialty:  General Surgery   Why:  For wound re-check   Contact information:   [place] [address] [phone]      Discharge Diagnoses:  1. Symptomatic cholelithiasis  Surgical Procedure: laparoscopic cholecystectomy with IOC  Discharge Condition: good Disposition: home  Diet recommendation: regular, low fat  Filed Weights   12/23/14 1320  Weight: 60.555 kg (133 lb 8 oz)    Hospital Course:  She came in for planned [facility] with IOC for symptomatic gallstones. She was kept overnight for observation and pain control. On POD 1 she was doing well. Her vitals were stable. She was tolerating a diet. She was ambulating. Her pain had improved. We discussed dc instructions  BP 137/53 mmHg  Pulse 63  Temp(Src) 97.6 F (36.4 C) (Oral)  Resp 16  Ht 5\' 2"  (1.575 m)  Wt 60.555 kg (133 lb 8 oz)  BMI 24.41 kg/m2  SpO2 100%  Gen: alert, NAD, non-toxic appearing Pupils: equal, no scleral icterus Pulm: Lungs clear to auscultation, symmetric chest rise CV: regular rate and rhythm Abd: soft,  Min tender, nondistended. Bruising at umbilicus. No cellulitis. No incisional hernia Ext: no edema, no calf tenderness Skin: no rash, no jaundice    Discharge Instructions  Discharge Instructions    Diet - low sodium heart healthy    Complete by:  As directed      Discharge instructions    Complete by:  As directed   See [facility] discharge instructions     Increase activity slowly    Complete by:  As directed             Medication List    STOP taking these medications        amoxicillin-clavulanate 875-125 MG tablet   Commonly known as:  AUGMENTIN      TAKE these medications        ALPRAZolam 0.25 MG tablet  Commonly known as:  XANAX  Take 0.25 mg by mouth at bedtime as needed for anxiety.     Biotin 10 MG Caps  Take 10 mg by mouth daily.     multivitamin with minerals tablet  Take 1 tablet by mouth daily.     Oxcarbazepine 300 MG tablet  Commonly known as:  TRILEPTAL  Take 1 tablet (300 mg total) by mouth 2 (two) times daily.     oxyCODONE 5 MG immediate release tablet  Commonly known as:  Oxy IR/ROXICODONE  Take 1-2 tablets (5-10 mg total) by mouth every 4 (four) hours as needed for moderate pain.     promethazine 12.5 MG tablet  Commonly known as:  PHENERGAN  Take 12.5 mg by mouth every 8 (eight) hours as needed. nausea     saccharomyces boulardii 250 MG capsule  Commonly known as:  FLORASTOR  Take 1 capsule (250 mg total) by mouth 2 (two) times daily.     valACYclovir 500 MG tablet  Commonly known as:  VALTREX  Take 500-1,000 mg by mouth daily as needed (for cold sore outbreaks).     zolpidem 10 MG tablet  Commonly known as:  AMBIEN  Take 10 mg by mouth  at bedtime as needed for sleep.           Follow-up Information    Follow up with [name], MD. Schedule an appointment as soon as possible for a visit in 3 weeks.   Specialty:  General Surgery   Why:  For wound re-check   Contact information:   [place] [id number] [phone]        The results of significant diagnostics from this hospitalization (including imaging, microbiology, ancillary and laboratory) are listed below for reference.    Significant Diagnostic Studies: Dg Cholangiogram Operative  12/23/2014  CLINICAL DATA:  Gallstones EXAM: INTRAOPERATIVE CHOLANGIOGRAM TECHNIQUE: Cholangiographic images from the C-arm fluoroscopic device were submitted for interpretation post-operatively. Please see the procedural report for the amount of contrast and the fluoroscopy time  utilized. COMPARISON:  None. FINDINGS: Contrast fills the biliary tree and duodenum without filling defects in the common bile duct. IMPRESSION: Patent biliary tree. Electronically Signed   By: Marybelle Killings M.D.   On: 12/23/2014 17:09    Microbiology: No results found for this or any previous visit (from the past 240 hour(s)).   Labs: Basic Metabolic Panel:  Recent Labs Lab 12/21/14 1230  NA 132*  K 4.4  CL 97*  CO2 28  GLUCOSE 88  BUN 9  CREATININE 0.52  CALCIUM 9.2   Liver Function Tests: No results for input(s): AST, ALT, ALKPHOS, BILITOT, PROT, ALBUMIN in the last 168 hours. No results for input(s): LIPASE, AMYLASE in the last 168 hours. No results for input(s): AMMONIA in the last 168 hours. CBC:  Recent Labs Lab 12/21/14 1230 12/24/14 0413  WBC 6.8 5.9  HGB 14.1 12.8  HCT 41.4 37.6  MCV 89.0 90.0  PLT 243 229   Cardiac Enzymes: No results for input(s): CKTOTAL, CKMB, CKMBINDEX, TROPONINI in the last 168 hours. BNP: BNP (last 3 results) No results for input(s): BNP in the last 8760 hours.  ProBNP (last 3 results) No results for input(s): PROBNP in the last 8760 hours.  CBG: No results for input(s): GLUCAP in the last 168 hours.  Active Problems:   S/P laparoscopic cholecystectomy   Time coordinating discharge: 15 minutes  Signed:  [name], MD [facility], [place] [phone] 12/24/2014, 8:13 AM

## 2015-02-14 ENCOUNTER — Other Ambulatory Visit: Payer: Self-pay | Admitting: Neurology

## 2015-02-15 ENCOUNTER — Telehealth: Payer: Self-pay | Admitting: Neurology

## 2015-02-15 NOTE — Telephone Encounter (Signed)
VM-PT called and said she was getting low on her medication and wanted to know if she could get a refill called in/Dawn CB# [phone]

## 2015-02-15 NOTE — Telephone Encounter (Signed)
I spoke with patient. She was calling because the thought she needed a refill on her Trileptal, but states that she does have enough pills to last her until her ov on next week.

## 2015-02-16 ENCOUNTER — Telehealth: Payer: Self-pay | Admitting: Neurology

## 2015-02-16 NOTE — Telephone Encounter (Signed)
Returned call. Patient states she still has a msg on her vm from me from yesterday and wanted to make sure that I hadn't called her again. Did tell her that I hadn't call her again after we spoke yesterday.

## 2015-02-16 NOTE — Telephone Encounter (Signed)
Pt needs to talks to [place] please call [phone]

## 2015-02-23 ENCOUNTER — Encounter: Payer: Self-pay | Admitting: Neurology

## 2015-02-23 ENCOUNTER — Ambulatory Visit (INDEPENDENT_AMBULATORY_CARE_PROVIDER_SITE_OTHER): Payer: Medicare Other | Admitting: Neurology

## 2015-02-23 VITALS — BP 128/70 | HR 68 | Ht 63.0 in | Wt 135.0 lb

## 2015-02-23 DIAGNOSIS — G40009 Localization-related (focal) (partial) idiopathic epilepsy and epileptic syndromes with seizures of localized onset, not intractable, without status epilepticus: Secondary | ICD-10-CM

## 2015-02-23 DIAGNOSIS — G4700 Insomnia, unspecified: Secondary | ICD-10-CM

## 2015-02-23 MED ORDER — OXCARBAZEPINE 300 MG PO TABS: 300.0000 mg | ORAL_TABLET | Freq: Two times a day (BID) | ORAL | Status: DC

## 2015-02-23 NOTE — Progress Notes (Signed)
NEUROLOGY FOLLOW UP OFFICE NOTE  Joan Mclaughlin [id number]  HISTORY OF PRESENT ILLNESS: I had the pleasure of seeing Joan Mclaughlin in follow-up in the neurology clinic on 02/23/2015.  The patient was last seen a year ago for seizures. She continues to do well with no seizures since July 2013 on Trileptal 300mg  BID. She denies any headaches, dizziness, diplopia, focal numbness/tingling/weakness, olfactory/gustatory hallucinations, myoclonic jerks. She continues to have sleep difficulties and is taking Belsomra, but sleep is not any better. She was in the hospital in October for diverticulitis and December for cholelithiasis, and has recovered well.   HPI: This is a 75 yo RH woman with a history of seizures diagnosed in 2008. She was in [place] and was sleep deprived, started feeling strange, "like looking from the outside in," then fell down with a generalized convulsion. She was living in [place] and had seen a neurologist, started on Trileptal, which she had been taking for several years until she self-reduced dose by half. She had been taking this lower dose for 2 years with no seizures until July 2013. At that time, she had been taking lorazepam 1mg  qhs for several years and did not take it the night prior, instead took a different over the counter sleep aid. She woke up with blood on her face and bowel incontinence, then apparently had another seizure that was witnessed by her son with foaming at the mouth. She was started on Keppra but felt weird on this and was put back on Trileptal 300mg  BID since then, with no further seizures.   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 She "always fainted" in church. In college, she was drinking the night before and recalls looking in the mirror, then found herself on the floor. She denies any gaps in time, no staring/unresponsive episodes, olfactory/gustatory hallucinations, focal  numbness/tingling/weakness, myoclonic jerks. She has never been a good sleeper, and has tried all the over the counter medications with no effect. She has tried Ambien (felt hungover), Lunesta and Melatonin gave her weird nightmarish thoughts. She has been taking lorazepam nightly for at least 3 or 4 years, and this is the only medication that helps her without feeling hungover the next day.   Epilepsy Risk Factors: Her brother has seizures. Another brother may have seizures also, he takes phenobarbital. Her nephew may have seizures as well. Otherwise she had a normal birth and early development. There is no history of febrile convulsions, CNS infections such as meningitis/encephalitis, significant traumatic brain injury, neurosurgical procedures.  Diagnostic Data: Routine EEG by Dr. Sonny Masters 07/2005: Left posterior frontal sharp wave activity with phase reversal at F7. Left frontal sharp and slow activity. Abnormal EEG with posterior frontal low activity compatible with seizure disorder with left frontal focus.  Routine EEG by Dr. Sonny Masters 06/2007: Spike activity with phase reversal noted at [facility]. Hyperventilation denoted spike activity. Impression: Left frontal region irritative lesion compatible with underlying seizures.  Routine EEG by Dr. Sonny Masters 05/2009: Left temporal spike activity noted with phase reversal T3. Impression: Abnormal due to irritative lesion left temporal region compatible with underlying seizure disorder.  MRI brain 04/2006: There are scattered punctate foci of signal abnormality in the cerebral white matter bilaterally. These findings are consistent with mild microvascular ischemia.  Per Dr. Prentice Docker note: "Reviewed MRI head, MRI brain from March 28, 2006 demonstrates atrophy in the medial temporal lobes, more prominent on the right." She had a seizure  in July 2013 felt to be provoked by diphenhydramine and low dose Trileptal  PAST MEDICAL HISTORY: Past Medical History  Diagnosis  Date  . Seizures ([place])   . Anxiety   . Allergy   . Diverticulitis   . Heart murmur     "ONLY DETECTED WHEN LYING DOWN"  . Cholecystitis   . GERD (gastroesophageal reflux disease)     "RELATED TO GALLBLADDER"  . Nocturia   . Difficulty sleeping     takes med to sleep  . Hyperlipidemia     MEDICATIONS: Current Outpatient Prescriptions on File Prior to Visit  Medication Sig Dispense Refill  . ALPRAZolam (XANAX) 0.25 MG tablet Take 0.25 mg by mouth at bedtime as needed for anxiety.    . Multiple Vitamins-Minerals (MULTIVITAMIN WITH MINERALS) tablet Take 1 tablet by mouth daily.    . Oxcarbazepine (TRILEPTAL) 300 MG tablet Take 1 tablet (300 mg total) by mouth 2 (two) times daily. 180 tablet 3  . saccharomyces boulardii (FLORASTOR) 250 MG capsule Take 1 capsule (250 mg total) by mouth 2 (two) times daily. (Patient taking differently: Take 250 mg by mouth daily. ) 20 capsule 0   No current facility-administered medications on file prior to visit.    ALLERGIES: No Known Allergies  FAMILY HISTORY: Family History  Problem Relation Age of Onset  . Arthritis Father     SOCIAL HISTORY: Social History   Social History  . Marital Status: Single    Spouse Name: N/A  . Number of Children: N/A  . Years of Education: 14   Occupational History  . retired    Social History Main Topics  . Smoking status: Never Smoker   . Smokeless tobacco: Never Used  . Alcohol Use: No  . Drug Use: No  . Sexual Activity: Not on file   Other Topics Concern  . Not on file   Social History Narrative   Patient lives alone.   patient has 4 grandchildren.   Patient is retired   Patient has 2 years of college.   Patient is right handed.          REVIEW OF SYSTEMS: Constitutional: No fevers, chills, or sweats, no generalized fatigue, change in appetite Eyes: No visual changes, double vision, eye pain Ear, nose and throat: No hearing loss, ear pain, nasal congestion, sore  throat Cardiovascular: No chest pain, palpitations Respiratory:  No shortness of breath at rest or with exertion, wheezes GastrointestinaI: No nausea, vomiting, diarrhea, abdominal pain, fecal incontinence Genitourinary:  No dysuria, urinary retention or frequency Musculoskeletal:  No neck pain, back pain Integumentary: No rash, pruritus, skin lesions Neurological: as above Psychiatric: No depression, +insomnia,no anxiety Endocrine: No palpitations, fatigue, diaphoresis, mood swings, change in appetite, change in weight, increased thirst Hematologic/Lymphatic:  No anemia, purpura, petechiae. Allergic/Immunologic: no itchy/runny eyes, nasal congestion, recent allergic reactions, rashes  PHYSICAL EXAM: Filed Vitals:   02/23/15 1519  BP: 128/70  Pulse: 68   General: No acute distress Head:  Normocephalic/atraumatic Neck: supple, no paraspinal tenderness, full range of motion Heart:  Regular rate and rhythm Lungs:  Clear to auscultation bilaterally Back: No paraspinal tenderness Skin/Extremities: No rash, no edema Neurological Exam: alert and oriented to person, place, and time. No aphasia or dysarthria. Fund of knowledge is appropriate.  Recent and remote memory are intact.  Attention and concentration are normal.    Able to name objects and repeat phrases. Cranial nerves: Pupils equal, round, reactive to light.  Extraocular movements intact with no nystagmus. Visual  fields full. Facial sensation intact. No facial asymmetry. Tongue, uvula, palate midline.  Motor: Bulk and tone normal, muscle strength 5/5 throughout with no pronator drift.  Sensation to light touch intact.  No extinction to double simultaneous stimulation.  Deep tendon reflexes 2+ throughout, toes downgoing.  Finger to nose testing intact.  Gait narrow-based and steady, able to tandem walk adequately.  Romberg negative.  IMPRESSION: This is a 75 yo RH woman with a history of episodes of loss of consciousness as a teenager,  and 2 witnessed convulsions in 2008 and 2013. The last seizure occurred in the setting of reduced Trileptal dose and not taking lorazepam the night prior. Prior EEGs have shown left frontotemporal sharp waves, suggestive of focal to bilateral tonic-clonic seizures. No seizures since 2013. Continue Trileptal 300mg  BID. She continues to work with her PCP with her insomnia. She is aware of [place] driving laws, and she knows to stop driving after a seizure, until 6 months seizure-free. She will follow-up in 1 year and knows to call our office for any problems in the interim.  Thank you for allowing me to participate in her care.  Please do not hesitate to call for any questions or concerns.  The duration of this appointment visit was 15 minutes of face-to-face time with the patient.  Greater than 50% of this time was spent in counseling, explanation of diagnosis, planning of further management, and coordination of care.   Ellouise Newer, M.D.   CC: Dr. Chapman Fitch

## 2015-02-23 NOTE — Patient Instructions (Signed)
1. Continue oxcarbazepine 300mg  twice a day 2. Follow-up in 1 year, call for any changes  Seizure Precautions: 1. If medication has been prescribed for you to prevent seizures, take it exactly as directed.  Do not stop taking the medicine without talking to your doctor first, even if you have not had a seizure in a long time.   2. Avoid activities in which a seizure would cause danger to yourself or to others.  Don't operate dangerous machinery, swim alone, or climb in high or dangerous places, such as on ladders, roofs, or girders.  Do not drive unless your doctor says you may.  3. If you have any warning that you may have a seizure, lay down in a safe place where you can't hurt yourself.    4.  No driving for 6 months from last seizure, as per [facility]   Please refer to the following link on the [place] website for more information: http://[email or web address]/[email or web address]/driving/drivingu.cfm   5.  Maintain good sleep hygiene. Avoid alcohol.  6.  Contact your doctor if you have any problems that may be related to the medicine you are taking.  7.  Call 911 and bring the patient back to the ED if:        A.  The seizure lasts longer than 5 minutes.       B.  The patient doesn't awaken shortly after the seizure  C.  The patient has new problems such as difficulty seeing, speaking or moving  D.  The patient was injured during the seizure  E.  The patient has a temperature over 102 F (39C)  F.  The patient vomited and now is having trouble breathing

## 2015-03-02 DIAGNOSIS — G40009 Localization-related (focal) (partial) idiopathic epilepsy and epileptic syndromes with seizures of localized onset, not intractable, without status epilepticus: Secondary | ICD-10-CM | POA: Insufficient documentation

## 2015-05-06 ENCOUNTER — Other Ambulatory Visit: Payer: Self-pay | Admitting: Family Medicine

## 2015-05-06 DIAGNOSIS — Z1231 Encounter for screening mammogram for malignant neoplasm of breast: Secondary | ICD-10-CM

## 2015-06-07 ENCOUNTER — Ambulatory Visit
Admission: RE | Admit: 2015-06-07 | Discharge: 2015-06-07 | Disposition: A | Discharge status: Home / Self Care | Payer: Medicare Other | Source: Ambulatory Visit | Attending: Family Medicine | Admitting: Family Medicine

## 2015-06-07 DIAGNOSIS — Z1231 Encounter for screening mammogram for malignant neoplasm of breast: Secondary | ICD-10-CM

## 2015-07-04 ENCOUNTER — Other Ambulatory Visit: Payer: Self-pay | Admitting: Gastroenterology

## 2016-01-05 ENCOUNTER — Other Ambulatory Visit: Payer: Self-pay | Admitting: Physician Assistant

## 2016-01-13 ENCOUNTER — Other Ambulatory Visit: Payer: Self-pay | Admitting: Physician Assistant

## 2016-02-27 ENCOUNTER — Encounter: Payer: Self-pay | Admitting: Neurology

## 2016-02-27 ENCOUNTER — Ambulatory Visit (INDEPENDENT_AMBULATORY_CARE_PROVIDER_SITE_OTHER): Payer: Medicare Other | Admitting: Neurology

## 2016-02-27 ENCOUNTER — Other Ambulatory Visit: Payer: Self-pay | Admitting: Neurology

## 2016-02-27 VITALS — BP 140/100 | HR 76 | Ht 63.0 in | Wt 145.1 lb

## 2016-02-27 DIAGNOSIS — G40009 Localization-related (focal) (partial) idiopathic epilepsy and epileptic syndromes with seizures of localized onset, not intractable, without status epilepticus: Secondary | ICD-10-CM | POA: Diagnosis not present

## 2016-02-27 MED ORDER — OXCARBAZEPINE 300 MG PO TABS: 300.0000 mg | ORAL_TABLET | Freq: Two times a day (BID) | ORAL | 3 refills | Status: DC

## 2016-02-27 NOTE — Progress Notes (Signed)
NEUROLOGY FOLLOW UP OFFICE NOTE  Joan Mclaughlin [id number]  HISTORY OF PRESENT ILLNESS: I had the pleasure of seeing Joan Mclaughlin in follow-up in the neurology clinic on 02/27/2016.  The patient was last seen a year ago for seizures. She continues to do well with no seizures since July 2013 on Trileptal 300mg  BID. She denies any headaches, dizziness, diplopia, focal numbness/tingling/weakness, olfactory/gustatory hallucinations, myoclonic jerks, staring/unresponsive episodes, gaps in time. She gets 6 hours of sleep taking Xanax and OTC medication for leg cramps. She can fall asleep early in the evening watching TV, then goes back to sleep in 30 minutes.   HPI: This is a 76yo RH woman with a history of seizures diagnosed in 2008. She was in [place] and was sleep deprived, started feeling strange, "like looking from the outside in," then fell down with a generalized convulsion. She was living in [place] and had seen a neurologist, started on Trileptal, which she had been taking for several years until she self-reduced dose by half. She had been taking this lower dose for 2 years with no seizures until July 2013. At that time, she had been taking lorazepam 1mg  qhs for several years and did not take it the night prior, instead took a different over the counter sleep aid. She woke up with blood on her face and bowel incontinence, then apparently had another seizure that was witnessed by her son with foaming at the mouth. She was started on Keppra but felt weird on this and was put back on Trileptal 300mg  BID since then, with no further seizures.   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 She "always fainted" in church. In college, she was drinking the night before and recalls looking in the mirror, then found herself on the floor. She denies any gaps in time, no staring/unresponsive episodes, olfactory/gustatory hallucinations,  focal numbness/tingling/weakness, myoclonic jerks. She has never been a good sleeper, and has tried all the over the counter medications with no effect. She has tried Ambien (felt hungover), Lunesta and Melatonin gave her weird nightmarish thoughts. She has been taking lorazepam nightly for at least 3 or 4 years, and this is the only medication that helps her without feeling hungover the next day.   Epilepsy Risk Factors: Her brother has seizures. Another brother may have seizures also, he takes phenobarbital. Her nephew may have seizures as well. Otherwise she had a normal birth and early development. There is no history of febrile convulsions, CNS infections such as meningitis/encephalitis, significant traumatic brain injury, neurosurgical procedures.  Diagnostic Data: Routine EEG by Dr. Sonny Masters 07/2005: Left posterior frontal sharp wave activity with phase reversal at F7. Left frontal sharp and slow activity. Abnormal EEG with posterior frontal low activity compatible with seizure disorder with left frontal focus.  Routine EEG by Dr. Sonny Masters 06/2007: Spike activity with phase reversal noted at [facility]. Hyperventilation denoted spike activity. Impression: Left frontal region irritative lesion compatible with underlying seizures.  Routine EEG by Dr. Sonny Masters 05/2009: Left temporal spike activity noted with phase reversal T3. Impression: Abnormal due to irritative lesion left temporal region compatible with underlying seizure disorder.  MRI brain 04/2006: There are scattered punctate foci of signal abnormality in the cerebral white matter bilaterally. These findings are consistent with mild microvascular ischemia.  Per Dr. Prentice Docker note: "Reviewed MRI head, MRI brain from March 28, 2006 demonstrates atrophy in the medial temporal lobes, more prominent on the right." She  had a seizure in July 2013 felt to be provoked by diphenhydramine and low dose Trileptal  PAST MEDICAL HISTORY: Past Medical History:    Diagnosis Date  . Allergy   . Anxiety   . Cholecystitis   . Difficulty sleeping    takes med to sleep  . Diverticulitis   . GERD (gastroesophageal reflux disease)    "RELATED TO GALLBLADDER"  . Heart murmur    "ONLY DETECTED WHEN LYING DOWN"  . Hyperlipidemia   . Nocturia   . Seizures ([place])     MEDICATIONS: Current Outpatient Prescriptions on File Prior to Visit  Medication Sig Dispense Refill  . ALPRAZolam (XANAX) 0.25 MG tablet Take 0.25 mg by mouth at bedtime as needed for anxiety.    . BELSOMRA 20 MG TABS 20 mg. Take 1 tablet at bedtime    . Digestive Enzymes (DIGEST II PO) Take by mouth 3 (three) times daily.    . Multiple Vitamins-Minerals (MULTIVITAMIN WITH MINERALS) tablet Take 1 tablet by mouth daily.    . Oxcarbazepine (TRILEPTAL) 300 MG tablet Take 1 tablet (300 mg total) by mouth 2 (two) times daily. 180 tablet 3  . Probiotic Product (VSL#3) CAPS Take by mouth 2 (two) times daily.    Marland Kitchen saccharomyces boulardii (FLORASTOR) 250 MG capsule Take 1 capsule (250 mg total) by mouth 2 (two) times daily. (Patient taking differently: Take 250 mg by mouth daily. ) 20 capsule 0   No current facility-administered medications on file prior to visit.     ALLERGIES: Allergies  Allergen Reactions  . Amoxicillin-Pot Clavulanate Other (See Comments)  . Suvorexant Other (See Comments)    FAMILY HISTORY: Family History  Problem Relation Age of Onset  . Arthritis Father     SOCIAL HISTORY: Social History   Social History  . Marital status: Single    Spouse name: N/A  . Number of children: N/A  . Years of education: 59   Occupational History  . retired    Social History Main Topics  . Smoking status: Never Smoker  . Smokeless tobacco: Never Used  . Alcohol use No  . Drug use: No  . Sexual activity: Not on file   Other Topics Concern  . Not on file   Social History Narrative   Patient lives alone.   patient has 4 grandchildren.   Patient is retired   Patient  has 2 years of college.   Patient is right handed.          REVIEW OF SYSTEMS: Constitutional: No fevers, chills, or sweats, no generalized fatigue, change in appetite Eyes: No visual changes, double vision, eye pain Ear, nose and throat: No hearing loss, ear pain, nasal congestion, sore throat Cardiovascular: No chest pain, palpitations Respiratory:  No shortness of breath at rest or with exertion, wheezes GastrointestinaI: No nausea, vomiting, diarrhea, abdominal pain, fecal incontinence Genitourinary:  No dysuria, urinary retention or frequency Musculoskeletal:  No neck pain, back pain Integumentary: No rash, pruritus, skin lesions Neurological: as above Psychiatric: No depression, +insomnia,no anxiety Endocrine: No palpitations, fatigue, diaphoresis, mood swings, change in appetite, change in weight, increased thirst Hematologic/Lymphatic:  No anemia, purpura, petechiae. Allergic/Immunologic: no itchy/runny eyes, nasal congestion, recent allergic reactions, rashes  PHYSICAL EXAM: Vitals:   02/27/16 1604  BP: (!) 140/100  Pulse: 76   General: No acute distress Head:  Normocephalic/atraumatic Neck: supple, no paraspinal tenderness, full range of motion Heart:  Regular rate and rhythm Lungs:  Clear to auscultation bilaterally Back: No paraspinal tenderness  Skin/Extremities: No rash, no edema Neurological Exam: alert and oriented to person, place, and time. No aphasia or dysarthria. Fund of knowledge is appropriate.  Recent and remote memory are intact.  Attention and concentration are normal.    Able to name objects and repeat phrases. Cranial nerves: Pupils equal, round, reactive to light.  Extraocular movements intact with no nystagmus. Visual fields full. Facial sensation intact. No facial asymmetry. Tongue, uvula, palate midline.  Motor: Bulk and tone normal, muscle strength 5/5 throughout with no pronator drift.  Sensation to light touch intact.  No extinction to double  simultaneous stimulation.  Deep tendon reflexes 2+ throughout, toes downgoing.  Finger to nose testing intact.  Gait narrow-based and steady, able to tandem walk adequately.  Romberg negative.  IMPRESSION: This is a 76 yo RH woman with a history of episodes of loss of consciousness as a teenager, and 2 witnessed convulsions in 2008 and 2013. The last seizure occurred in the setting of reduced Trileptal dose and not taking lorazepam the night prior. Prior EEGs have shown left frontotemporal sharp waves, suggestive of focal to bilateral tonic-clonic seizures. No seizures since 2013. Continue Trileptal 300mg  BID, refills sent today. She is aware of [place] driving laws, and she knows to stop driving after a seizure, until 6 months seizure-free. She will follow-up in 1 year and knows to call our office for any problems in the interim.  Thank you for allowing me to participate in her care.  Please do not hesitate to call for any questions or concerns.  The duration of this appointment visit was 15 minutes of face-to-face time with the patient.  Greater than 50% of this time was spent in counseling, explanation of diagnosis, planning of further management, and coordination of care.   Ellouise Newer, M.D.   CC: Dr. Chapman Fitch

## 2016-02-27 NOTE — Patient Instructions (Signed)
It was great seeing you! 1. Continue Trileptal 300mg  twice a day 2. Follow-up in 1 year, call for any changes  Seizure Precautions: 1. If medication has been prescribed for you to prevent seizures, take it exactly as directed.  Do not stop taking the medicine without talking to your doctor first, even if you have not had a seizure in a long time.   2. Avoid activities in which a seizure would cause danger to yourself or to others.  Don't operate dangerous machinery, swim alone, or climb in high or dangerous places, such as on ladders, roofs, or girders.  Do not drive unless your doctor says you may.  3. If you have any warning that you may have a seizure, lay down in a safe place where you can't hurt yourself.    4.  No driving for 6 months from last seizure, as per [facility].   Please refer to the following link on the [place] website for more information: http://[email or web address]/[email or web address]/driving/drivingu.cfm   5.  Maintain good sleep hygiene. Avoid alcohol.  6.  Contact your doctor if you have any problems that may be related to the medicine you are taking.  7.  Call 911 and bring the patient back to the ED if:        A.  The seizure lasts longer than 5 minutes.       B.  The patient doesn't awaken shortly after the seizure  C.  The patient has new problems such as difficulty seeing, speaking or moving  D.  The patient was injured during the seizure  E.  The patient has a temperature over 102 F (39C)  F.  The patient vomited and now is having trouble breathing

## 2016-09-19 ENCOUNTER — Encounter: Payer: Self-pay | Admitting: Family Medicine

## 2016-09-19 ENCOUNTER — Ambulatory Visit (INDEPENDENT_AMBULATORY_CARE_PROVIDER_SITE_OTHER): Payer: Medicare Other | Admitting: Family Medicine

## 2016-09-19 VITALS — BP 128/42 | HR 65 | Ht 62.0 in | Wt 147.6 lb

## 2016-09-19 DIAGNOSIS — Z8619 Personal history of other infectious and parasitic diseases: Secondary | ICD-10-CM

## 2016-09-19 DIAGNOSIS — Z13220 Encounter for screening for lipoid disorders: Secondary | ICD-10-CM

## 2016-09-19 DIAGNOSIS — Z0001 Encounter for general adult medical examination with abnormal findings: Secondary | ICD-10-CM | POA: Diagnosis not present

## 2016-09-19 HISTORY — DX: Personal history of other infectious and parasitic diseases: Z86.19

## 2016-09-19 LAB — COMPREHENSIVE METABOLIC PANEL
ALBUMIN: 4.2 g/dL (ref 3.5–5.2)
ALT: 17 U/L (ref 0–35)
AST: 20 U/L (ref 0–37)
Alkaline Phosphatase: 76 U/L (ref 39–117)
BILIRUBIN TOTAL: 0.4 mg/dL (ref 0.2–1.2)
BUN: 11 mg/dL (ref 6–23)
CALCIUM: 9.2 mg/dL (ref 8.4–10.5)
CO2: 29 meq/L (ref 19–32)
CREATININE: 0.58 mg/dL (ref 0.40–1.20)
Chloride: 100 mEq/L (ref 96–112)
GFR: 106.82 mL/min (ref >60.00)
Glucose, Bld: 83 mg/dL (ref 70–99)
Potassium: 4 mEq/L (ref 3.5–5.1)
Sodium: 136 mEq/L (ref 135–145)
TOTAL PROTEIN: 6.9 g/dL (ref 6.0–8.3)

## 2016-09-19 LAB — LIPID PANEL
Cholesterol: 184 mg/dL (ref 0–200)
HDL: 56.1 mg/dL (ref >39.00)
LDL CALC: 99 mg/dL (ref 0–99)
NONHDL: 128.27
Total CHOL/HDL Ratio: 3
Triglycerides: 144 mg/dL (ref 0.0–149.0)
VLDL: 28.8 mg/dL (ref 0.0–40.0)

## 2016-09-19 LAB — CBC
HCT: 40.2 % (ref 36.0–46.0)
Hemoglobin: 13.8 g/dL (ref 12.0–15.0)
MCHC: 34.4 g/dL (ref 30.0–36.0)
MCV: 93.9 fl (ref 78.0–100.0)
PLATELETS: 229 10*3/uL (ref 150.0–400.0)
RBC: 4.28 Mil/uL (ref 3.87–5.11)
RDW: 13.3 % (ref 11.5–15.5)
WBC: 6.1 10*3/uL (ref 4.0–10.5)

## 2016-09-19 NOTE — Patient Instructions (Signed)
Schedule an appointment for your wellness visit within the next few months.   Preventive Care 78 Years and Older, Female Preventive care refers to lifestyle choices and visits with your health care provider that can promote health and wellness. What does preventive care include?  A yearly physical exam. This is also called an annual well check.  Dental exams once or twice a year.  Routine eye exams. Ask your health care provider how often you should have your eyes checked.  Personal lifestyle choices, including: ? Daily care of your teeth and gums. ? Regular physical activity. ? Eating a healthy diet. ? Avoiding tobacco and drug use. ? Limiting alcohol use. ? Practicing safe sex. ? Taking low-dose aspirin every day. ? Taking vitamin and mineral supplements as recommended by your health care provider. What happens during an annual well check? The services and screenings done by your health care provider during your annual well check will depend on your age, overall health, lifestyle risk factors, and family history of disease. Counseling Your health care provider may ask you questions about your:  Alcohol use.  Tobacco use.  Drug use.  Emotional well-being.  Home and relationship well-being.  Sexual activity.  Eating habits.  History of falls.  Memory and ability to understand (cognition).  Work and work Statistician.  Reproductive health.  Screening You may have the following tests or measurements:  Height, weight, and BMI.  Blood pressure.  Lipid and cholesterol levels. These may be checked every 5 years, or more frequently if you are over 30 years old.  Skin check.  Lung cancer screening. You may have this screening every year starting at age 30 if you have a 30-pack-year history of smoking and currently smoke or have quit within the past 15 years.  Fecal occult blood test (FOBT) of the stool. You may have this test every year starting at age  18.  Flexible sigmoidoscopy or colonoscopy. You may have a sigmoidoscopy every 5 years or a colonoscopy every 10 years starting at age 78.  Hepatitis C blood test.  Hepatitis B blood test.  Sexually transmitted disease (STD) testing.  Diabetes screening. This is done by checking your blood sugar (glucose) after you have not eaten for a while (fasting). You may have this done every 1-3 years.  Bone density scan. This is done to screen for osteoporosis. You may have this done starting at age 29.  Mammogram. This may be done every 1-2 years. Talk to your health care provider about how often you should have regular mammograms.  Talk with your health care provider about your test results, treatment options, and if necessary, the need for more tests. Vaccines Your health care provider may recommend certain vaccines, such as:  Influenza vaccine. This is recommended every year.  Tetanus, diphtheria, and acellular pertussis (Tdap, Td) vaccine. You may need a Td booster every 10 years.  Varicella vaccine. You may need this if you have not been vaccinated.  Zoster vaccine. You may need this after age 48.  Measles, mumps, and rubella (MMR) vaccine. You may need at least one dose of MMR if you were born in 1957 or later. You may also need a second dose.  Pneumococcal 13-valent conjugate (PCV13) vaccine. One dose is recommended after age 54.  Pneumococcal polysaccharide (PPSV23) vaccine. One dose is recommended after age 45.  Meningococcal vaccine. You may need this if you have certain conditions.  Hepatitis A vaccine. You may need this if you have certain conditions or  if you travel or work in places where you may be exposed to hepatitis A.  Hepatitis B vaccine. You may need this if you have certain conditions or if you travel or work in places where you may be exposed to hepatitis B.  Haemophilus influenzae type b (Hib) vaccine. You may need this if you have certain conditions.  Talk to  your health care provider about which screenings and vaccines you need and how often you need them. This information is not intended to replace advice given to you by your health care provider. Make sure you discuss any questions you have with your health care provider. Document Released: 01/21/2015 Document Revised: 09/14/2015 Document Reviewed: 10/26/2014 Elsevier Interactive Patient Education  2017 [employer].

## 2016-09-19 NOTE — Progress Notes (Signed)
Subjective:  Joan Mclaughlin is a 76 y.o. female who presents today for her annual comprehensive physical exam and to establish care  HPI:  Joan Mclaughlin has no acute complaints today.   Lifestyle Diet: Balanced diet. Plenty of fruits and vegetables.  Exercise: Exercises 4 times per week.   Depression screen PHQ 2/9 09/19/2016  Decreased Interest 0  Down, Depressed, Hopeless 0  PHQ - 2 Score 0   Health Maintenance Due  Topic Date Due  . TETANUS/TDAP  07/25/1959  . DEXA SCAN  07/26/2003  . PNA vac Low Risk Adult (1 of 2 - PCV13) 07/26/2003    ROS: A 14 point review of systems was performed and was negative  PMH:  The following were reviewed and entered/updated in epic: Past Medical History:  Diagnosis Date  . Allergy   . Anxiety   . Cholecystitis   . Difficulty sleeping    takes med to sleep  . Diverticulitis   . GERD (gastroesophageal reflux disease)    "RELATED TO GALLBLADDER"  . Heart murmur    "ONLY DETECTED WHEN LYING DOWN"  . Hyperlipidemia   . Nocturia   . Seizures [facility])    Patient Active Problem List   Diagnosis Date Noted  . History of Lyme disease 09/19/2016  . Localization-related idiopathic epilepsy and epileptic syndromes with seizures of localized onset, not intractable, without status epilepticus ([place]) 03/02/2015  . S/P laparoscopic cholecystectomy 12/23/2014  . Blood in stool 11/07/2014  . Insomnia 05/02/2013  . Seizure disorder ([place]) 11/03/2012   Past Surgical History:  Procedure Laterality Date  . BACK SURGERY  07/2007  . CHOLECYSTECTOMY N/A 12/23/2014   Procedure: LAPAROSCOPIC CHOLECYSTECTOMY WITH INTRAOPERATIVE CHOLANGIOGRAM;  Surgeon: Greer Pickerel, MD;  Location: [facility];  Service: General;  Laterality: N/A;  . MOUTH SURGERY    . TUBAL LIGATION      Family History  Problem Relation Age of Onset  . Arthritis Father   . Alcohol abuse Mother   . Diabetes Mother   . Heart disease Mother   . Hypertension Mother   . Stroke Mother   .  Depression Sister   . Hearing loss Sister   . Hyperlipidemia Sister   . Alcohol abuse Daughter   . Alcohol abuse Son   . Depression Sister   . Early death Sister   . Alcohol abuse Brother   . Depression Sister   . Heart disease Sister     Medications- reviewed and updated Current Outpatient Prescriptions  Medication Sig Dispense Refill  . cyanocobalamin 1000 MCG tablet Take 1,000 mcg by mouth daily.    Marland Kitchen ALPRAZolam (XANAX) 0.25 MG tablet Take 0.25 mg by mouth at bedtime as needed for anxiety.    Marland Kitchen aspirin EC 81 MG tablet Take by mouth.    . BELSOMRA 20 MG TABS 20 mg. Take 1 tablet at bedtime    . Digestive Enzymes (DIGEST II PO) Take by mouth 3 (three) times daily.    . hydrOXYzine (ATARAX/VISTARIL) 10 MG tablet     . montelukast (SINGULAIR) 10 MG tablet     . Multiple Vitamins-Minerals (MULTIVITAMIN WITH MINERALS) tablet Take 1 tablet by mouth daily.    . Oxcarbazepine (TRILEPTAL) 300 MG tablet Take 1 tablet (300 mg total) by mouth 2 (two) times daily. 180 tablet 3  . Probiotic Product (VSL#3) CAPS Take by mouth 2 (two) times daily.    Marland Kitchen saccharomyces boulardii (FLORASTOR) 250 MG capsule Take 1 capsule (250 mg total) by mouth  2 (two) times daily. (Patient taking differently: Take 250 mg by mouth daily. ) 20 capsule 0   No current facility-administered medications for this visit.     Allergies-reviewed and updated Allergies  Allergen Reactions  . Amoxicillin-Pot Clavulanate Other (See Comments)  . Suvorexant Other (See Comments)    Social History   Social History  . Marital status: Single    Spouse name: N/A  . Number of children: N/A  . Years of education: 56   Occupational History  . retired    Social History Main Topics  . Smoking status: Never Smoker  . Smokeless tobacco: Never Used  . Alcohol use No  . Drug use: No  . Sexual activity: No   Other Topics Concern  . None   Social History Narrative   Patient lives alone.   patient has 4 grandchildren.    Patient is retired   Patient has 2 years of college.   Patient is right handed.          Objective:  Physical Exam: BP (!) 128/42 (BP Location: Left Arm, Patient Position: Sitting, Cuff Size: Normal)   Pulse 65   Ht 5\' 2"  (1.575 m)   Wt 147 lb 9.6 oz (67 kg)   SpO2 96%   BMI 27.00 kg/m   Body mass index is 27 kg/m. Gen: NAD, resting comfortably CV: RRR with no murmurs appreciated Pulm: NWOB, CTAB with no crackles, wheezes, or rhonchi GI: Normal bowel sounds present. Soft, Nontender, Nondistended. MSK: no edema, cyanosis, or clubbing noted Skin: warm, dry Neuro: grossly normal, moves all extremities Psych: Normal affect and thought content  Assessment/Plan:  Preventative Healthcare: Deferred flu vaccine. Will obtain records from prior PCP. Encouraged her to schedule a visit soon for her AWV. Check lipid panel today. Check glucose on CMET.   Patient Counseling:  -Nutrition: Stressed importance of moderation in sodium/caffeine intake, saturated fat and cholesterol, caloric balance, sufficient intake of fresh fruits, vegetables, and fiber.  -Stressed the importance of regular exercise.   -Substance Abuse: Discussed cessation/primary prevention of tobacco, alcohol, or other drug use; driving or other dangerous activities under the influence; availability of treatment for abuse.   -Injury prevention: Discussed safety belts, safety helmets, smoke detector, smoking near bedding or upholstery.   -Sexuality: Discussed sexually transmitted diseases, partner selection, use of condoms, avoidance of unintended pregnancy and contraceptive alternatives.   -Dental health: Discussed importance of regular tooth brushing, flossing, and dental visits.  -Health maintenance and immunizations reviewed. Please refer to Health maintenance section.  Return to care in 1 year for next preventative visit.   [name]. [name], MD 09/19/2016 10:55 AM

## 2016-09-21 ENCOUNTER — Telehealth: Payer: Self-pay | Admitting: Family Medicine

## 2016-09-21 NOTE — Telephone Encounter (Signed)
Patient returning missed phone call about lab results. Please call patient and advise. OK to leave message.

## 2016-09-24 ENCOUNTER — Telehealth: Payer: Self-pay | Admitting: Family Medicine

## 2016-09-24 NOTE — Telephone Encounter (Signed)
Please see lab results.

## 2016-09-24 NOTE — Telephone Encounter (Signed)
Patient calling back for lab results. She said to please let phone ring because it takes her a little while to get to phone.

## 2016-09-24 NOTE — Telephone Encounter (Signed)
Please see result notes.  

## 2016-09-24 NOTE — Telephone Encounter (Signed)
ROI faxed to [facility]

## 2016-11-20 ENCOUNTER — Ambulatory Visit: Payer: Medicare Other | Admitting: *Deleted

## 2016-11-27 ENCOUNTER — Ambulatory Visit (INDEPENDENT_AMBULATORY_CARE_PROVIDER_SITE_OTHER): Payer: Medicare Other | Admitting: *Deleted

## 2016-11-27 ENCOUNTER — Encounter: Payer: Self-pay | Admitting: *Deleted

## 2016-11-27 VITALS — BP 118/82 | HR 66 | Resp 16 | Ht 62.0 in | Wt 147.6 lb

## 2016-11-27 DIAGNOSIS — Z0000 Encounter for general adult medical examination without abnormal findings: Secondary | ICD-10-CM

## 2016-11-27 DIAGNOSIS — Z1389 Encounter for screening for other disorder: Secondary | ICD-10-CM

## 2016-11-27 NOTE — Progress Notes (Signed)
Pre visit review using our clinic review tool, if applicable. No additional management support is needed unless otherwise documented below in the visit note. 

## 2016-11-27 NOTE — Progress Notes (Signed)
I have personally reviewed the Medicare Annual Wellness Visit and agree with the assessment and plan.  [name]. [name], MD 11/27/2016 2:35 PM

## 2016-11-27 NOTE — Progress Notes (Signed)
PCP notes:   Health maintenance: Tdap: Pt will call insurance regarding coverage.  Dexa: Declined.  Abnormal screenings: No.    Patient concerns: No.   Nurse concerns: Pt states that she is no longer taking her Aspirin.    Next PCP appt: NA.

## 2016-11-27 NOTE — Progress Notes (Signed)
Subjective:   Joan Mclaughlin is a 76 y.o. female who presents for an Initial Medicare Annual Wellness Visit.  The Patient was informed that the wellness visit is to identify future health risk and educate and initiate measures that can reduce risk for increased disease through the lifespan.   Describes health as great.  Pt lives alone in one level condo.  Review of Systems    No ROS.  Medicare Wellness Visit. Additional risk factors are reflected in the social history.  Pt states that she takes a medication to sleep every night. 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 States she sleeps 6 hrs/night. Wakes up feeling rested after coffee. No naps during day.  Cardiac Risk Factors include: advanced age (>16men, >19 women);dyslipidemia     Objective:    Today's Vitals   11/27/16 1159  BP: 118/82  Pulse: 66  Resp: 16  SpO2: 98%  Weight: 147 lb 9.6 oz (67 kg)  Height: 5\' 2"  (1.575 m)   Body mass index is 27 kg/m.   Current Medications (verified) Outpatient Encounter Medications as of 11/27/2016  Medication Sig  . ALPRAZolam (XANAX) 0.25 MG tablet Take 0.25 mg by mouth at bedtime as needed for anxiety.  . Calcium Carbonate-Vit D-Min (CALCIUM 1200) 1200-1000 MG-UNIT CHEW Chew by mouth daily.  . cyanocobalamin 1000 MCG tablet Take 1,000 mcg by mouth daily.  . Digestive Enzymes (DIGEST II PO) Take by mouth 3 (three) times daily.  . hydrOXYzine (ATARAX/VISTARIL) 10 MG tablet as needed.   . montelukast (SINGULAIR) 10 MG tablet daily as needed.   . Multiple Vitamins-Minerals (MULTIVITAMIN WITH MINERALS) tablet Take 1 tablet by mouth daily.  . Omega-3 Fatty Acids (FISH OIL PO) Take by mouth 2 (two) times daily.  . Oxcarbazepine (TRILEPTAL) 300 MG tablet Take 1 tablet (300 mg total) by mouth 2 (two) times daily.  . Red Yeast Rice Extract (RED YEAST RICE PO) Take by mouth 2 (two) times daily.  Marland Kitchen saccharomyces  boulardii (FLORASTOR) 250 MG capsule Take 1 capsule (250 mg total) by mouth 2 (two) times daily. (Patient taking differently: Take 250 mg by mouth daily. )  . [DISCONTINUED] Probiotic Product (VSL#3) CAPS Take by mouth 2 (two) times daily.  Marland Kitchen aspirin EC 81 MG tablet Take by mouth.  . BELSOMRA 20 MG TABS 20 mg. Take 1 tablet at bedtime   No facility-administered encounter medications on file as of 11/27/2016.     Allergies (verified) Amoxicillin-pot clavulanate and Suvorexant   History: Past Medical History:  Diagnosis Date  . Allergy   . Anxiety   . Cholecystitis   . Difficulty sleeping    takes med to sleep  . Diverticulitis   . GERD (gastroesophageal reflux disease)    "RELATED TO GALLBLADDER"  . Heart murmur    "ONLY DETECTED WHEN LYING DOWN"  . Hyperlipidemia   . Lyme disease    Pt states she has had Lyme Disease twice.   . Nocturia   . Seizures ([place])    Past Surgical History:  Procedure Laterality Date  . BACK SURGERY  07/2007  . CHOLECYSTECTOMY N/A 12/23/2014   Procedure: LAPAROSCOPIC CHOLECYSTECTOMY WITH INTRAOPERATIVE CHOLANGIOGRAM;  Surgeon: Greer Pickerel, MD;  Location: [facility];  Service: General;  Laterality: N/A;  . MOUTH SURGERY    . TUBAL LIGATION     Family History  Problem Relation Age of Onset  . Arthritis Father   .  Alcohol abuse Mother   . Diabetes Mother   . Heart disease Mother   . Hypertension Mother   . Stroke Mother   . Depression Sister   . Hearing loss Sister   . Hyperlipidemia Sister   . Alcohol abuse Daughter   . Alcohol abuse Son   . Depression Sister   . Early death Sister   . Alcohol abuse Brother   . Depression Sister   . Heart disease Sister    Social History   Occupational History  . Occupation: retired  Tobacco Use  . Smoking status: Never Smoker  . Smokeless tobacco: Never Used  Substance and Sexual Activity  . Alcohol use: No    Alcohol/week: 0.0 oz  . Drug use: No  . Sexual activity: No    Tobacco  Counseling Counseling given: Not Answered   Activities of Daily Living In your present state of health, do you have any difficulty performing the following activities: 11/27/2016  Hearing? N  Vision? N  Difficulty concentrating or making decisions? N  Walking or climbing stairs? N  Dressing or bathing? N  Doing errands, shopping? N  Preparing Food and eating ? N  Using the Toilet? N  In the past six months, have you accidently leaked urine? N  Do you have problems with loss of bowel control? N  Managing your Medications? N  Managing your Finances? N  Housekeeping or managing your Housekeeping? N  Some recent data might be hidden    Immunizations and Health Maintenance Immunization History  Administered Date(s) Administered  . Pneumococcal Conjugate-13 05/16/2016  . Pneumococcal Polysaccharide-23 06/25/2013   Health Maintenance Due  Topic Date Due  . TETANUS/TDAP  07[birth year]/1957  . DEXA SCAN  07/26/2003    Patient Care Team: Vivi Barrack, MD as PCP - General (Family Medicine) Jola Schmidt, MD as Consulting Physician (Ophthalmology) Marilynne Halsted, MD as Referring Physician (Ophthalmology) Laurence Spates, MD as Consulting Physician (Gastroenterology) Warren Danes, PA-C as Physician Assistant (Dermatology)  Indicate any recent Medical Services you may have received from other than [facility] providers in the past year (date may be approximate).     Assessment:   This is a routine wellness examination for Joan Mclaughlin. Physical assessment deferred to PCP.   Hearing/Vision screen  Hearing Screening   125Hz  250Hz  500Hz  1000Hz  2000Hz  3000Hz  4000Hz  6000Hz  8000Hz   Right ear:   40 40 40  40    Left ear:   40 40 40  0    Vision Screening Comments: 2 years ago, Dr Valetta Close on [facility] [facility] [address] Dr Susa Simmonds. 12/12/16 for cataracts.  Dietary issues and exercise activities discussed: Current Exercise Habits: Home exercise routine, Type of exercise: Other - see comments;yoga;strength  training/weights(Takes a weekly boxing class.), Time (Minutes): 60, Frequency (Times/Week): 6, Weekly Exercise (Minutes/Week): 360, Intensity: Moderate, Exercise limited by: None identified  States she eats 3 meals/day. Mostly cooked at home. States that she recently started counting her calories and has started reading labels on her food. States that her trainer has given her 1100 calories/day and she is trying to stay under this by eating whole foods and unprocessed foods. Discussed increasing water intake.   Goals    None     Depression Screen PHQ 2/9 Scores 11/27/2016 09/19/2016  PHQ - 2 Score 0 0  PHQ- 9 Score 0 -   PHQ2 and PHQ9 completed. Score 0. No signs or symptoms of depression. Spent 12 minutes on this topic.  Fall Risk Fall Risk  11/27/2016 09/19/2016  02/27/2016  Falls in the past year? No No No    Cognitive Function: Ad8 score reviewed for issues:  Issues making decisions:no  Less interest in hobbies / activities:no  Repeats questions, stories (family complaining):no  Trouble using ordinary gadgets (microwave, computer, phone):no  Forgets the month or year: no  Mismanaging finances: no  Remembering appts:no  Daily problems with thinking and/or memory:no Ad8 score is=0 Patient completes crossword puzzles and word search.          Screening Tests Health Maintenance  Topic Date Due  . TETANUS/TDAP  07[birth year]25/1959  . DEXA SCAN  07/26/2003  . INFLUENZA VACCINE  04/07/2017 (Originally 08/08/2016)  . PNA vac Low Risk Adult  Completed      Plan:   Follow up with PCP as directed.  I have personally reviewed and noted the following in the patient's chart:   . Medical and social history . Use of alcohol, tobacco or illicit drugs  . Current medications and supplements . Functional ability and status . Nutritional status . Physical activity . Advanced directives . List of other physicians . Vitals . Screenings to include cognitive, depression, and  falls . Referrals and appointments  In addition, I have reviewed and discussed with patient certain preventive protocols, quality metrics, and best practice recommendations. A written personalized care plan for preventive services as well as general preventive health recommendations were provided to patient.     Williemae Area, RN   11/27/2016

## 2016-11-27 NOTE — Patient Instructions (Addendum)
Ms. Carrigg , Thank you for taking time to come for your Medicare Wellness Visit. I appreciate your ongoing commitment to your health goals. Please review the following plan we discussed and let me know if I can assist you in the future.   This is a list of the screening recommended for you and due dates:  Health Maintenance  Topic Date Due  . Tetanus Vaccine  07[birth year]25/1959  . DEXA scan (bone density measurement)  07/26/2003  . Flu Shot  04/07/2017*  . Pneumonia vaccines  Completed  *Topic was postponed. The date shown is not the original due date.   Preventive Care for Adults  A healthy lifestyle and preventive care can promote health and wellness. Preventive health guidelines for adults include the following key practices.  . A routine yearly physical is a good way to check with your health care provider about your health and preventive screening. It is a chance to share any concerns and updates on your health and to receive a thorough exam.  . Visit your dentist for a routine exam and preventive care every 6 months. Brush your teeth twice a day and floss once a day. Good oral hygiene prevents tooth decay and gum disease.  . The frequency of eye exams is based on your age, health, family medical history, use  of contact lenses, and other factors. Follow your health care provider's recommendations for frequency of eye exams.  . Eat a healthy diet. Foods like vegetables, fruits, whole grains, low-fat dairy products, and lean protein foods contain the nutrients you need without too many calories. Decrease your intake of foods high in solid fats, added sugars, and salt. Eat the right amount of calories for you. Get information about a proper diet from your health care provider, if necessary.  . Regular physical exercise is one of the most important things you can do for your health. Most adults should get at least 150 minutes of moderate-intensity exercise (any activity that increases your  heart rate and causes you to sweat) each week. In addition, most adults need muscle-strengthening exercises on 2 or more days a week.  Silver Sneakers may be a benefit available to you. To determine eligibility, you may visit the website: www.silversneakers.com or contact program at 401-612-5034 Mon-Fri between 8AM-8PM.   . Maintain a healthy weight. The body mass index (BMI) is a screening tool to identify possible weight problems. It provides an estimate of body fat based on height and weight. Your health care provider can find your BMI and can help you achieve or maintain a healthy weight.   For adults 20 years and older: ? A BMI below 18.5 is considered underweight. ? A BMI of 18.5 to 24.9 is normal. ? A BMI of 25 to 29.9 is considered overweight. ? A BMI of 30 and above is considered obese.   . Maintain normal blood lipids and cholesterol levels by exercising and minimizing your intake of saturated fat. Eat a balanced diet with plenty of fruit and vegetables. Blood tests for lipids and cholesterol should begin at age 59 and be repeated every 5 years. If your lipid or cholesterol levels are high, you are over 50, or you are at high risk for heart disease, you may need your cholesterol levels checked more frequently. Ongoing high lipid and cholesterol levels should be treated with medicines if diet and exercise are not working.  . If you smoke, find out from your health care provider how to quit. If you do  not use tobacco, please do not start.  . If you choose to drink alcohol, please do not consume more than 2 drinks per day. One drink is considered to be 12 ounces (355 mL) of beer, 5 ounces (148 mL) of wine, or 1.5 ounces (44 mL) of liquor.  . If you are 9-48 years old, ask your health care provider if you should take aspirin to prevent strokes.  . Use sunscreen. Apply sunscreen liberally and repeatedly throughout the day. You should seek shade when your shadow is shorter than you.  Protect yourself by wearing long sleeves, pants, a wide-brimmed hat, and sunglasses year round, whenever you are outdoors.  . Once a month, do a whole body skin exam, using a mirror to look at the skin on your back. Tell your health care provider of new moles, moles that have irregular borders, moles that are larger than a pencil eraser, or moles that have changed in shape or color.

## 2016-12-11 ENCOUNTER — Encounter: Payer: Self-pay | Admitting: Family Medicine

## 2016-12-11 ENCOUNTER — Ambulatory Visit (INDEPENDENT_AMBULATORY_CARE_PROVIDER_SITE_OTHER): Payer: Medicare Other | Admitting: Family Medicine

## 2016-12-11 VITALS — BP 120/80 | HR 66 | Ht 62.0 in | Wt 144.0 lb

## 2016-12-11 DIAGNOSIS — M6740 Ganglion, unspecified site: Secondary | ICD-10-CM

## 2016-12-11 DIAGNOSIS — R55 Syncope and collapse: Secondary | ICD-10-CM | POA: Diagnosis not present

## 2016-12-11 DIAGNOSIS — R42 Dizziness and giddiness: Secondary | ICD-10-CM | POA: Diagnosis not present

## 2016-12-11 HISTORY — DX: Ganglion, unspecified site: M67.40

## 2016-12-11 NOTE — Patient Instructions (Signed)
Please make sure you drink 8 glass of water daily.  We will check blood work.  Let me know if you would like the cyst on your finger drained.  Take care,  Dr [name]   Ganglion Cyst A ganglion cyst is a noncancerous, fluid-filled lump that occurs near joints or tendons. The ganglion cyst grows out of a joint or the lining of a tendon. It most often develops in the hand or wrist, but it can also develop in the shoulder, elbow, hip, knee, ankle, or foot. The round or oval ganglion cyst can be the size of a pea or larger than a grape. Increased activity may enlarge the size of the cyst because more fluid starts to build up. What are the causes? It is not known what causes a ganglion cyst to grow. However, it may be related to:  Inflammation or irritation around the joint.  An injury.  Repetitive movements or overuse.  Arthritis.  What increases the risk? Risk factors include:  Being a woman.  Being age 42-50.  What are the signs or symptoms? Symptoms may include:  A lump. This most often appears on the hand or wrist, but it can occur in other areas of the body.  Tingling.  Pain.  Numbness.  Muscle weakness.  Weak grip.  Less movement in a joint.  How is this diagnosed? Ganglion cysts are most often diagnosed based on a physical exam. Your health care provider will feel the lump and may shine a light alongside it. If it is a ganglion cyst, a light often shines through it. Your health care provider may order an X-ray, ultrasound, or MRI to rule out other conditions. How is this treated? Ganglion cysts usually go away on their own without treatment. If pain or other symptoms are involved, treatment may be needed. Treatment is also needed if the ganglion cyst limits your movement or if it gets infected. Treatment may include:  Wearing a brace or splint on your wrist or finger.  Taking anti-inflammatory medicine.  Draining fluid from the lump with a needle  (aspiration).  Injecting a steroid into the joint.  Surgery to remove the ganglion cyst.  Follow these instructions at home:  Do not press on the ganglion cyst, poke it with a needle, or hit it.  Take medicines only as directed by your health care provider.  Wear your brace or splint as directed by your health care provider.  Watch your ganglion cyst for any changes.  Keep all follow-up visits as directed by your health care provider. This is important. Contact a health care provider if:  Your ganglion cyst becomes larger or more painful.  You have increased redness, red streaks, or swelling.  You have pus coming from the lump.  You have weakness or numbness in the affected area.  You have a fever or chills. This information is not intended to replace advice given to you by your health care provider. Make sure you discuss any questions you have with your health care provider. Document Released: 12/23/1999 Document Revised: 06/02/2015 Document Reviewed: 06/09/2013 Elsevier Interactive Patient Education  2018 [employer].

## 2016-12-11 NOTE — Progress Notes (Signed)
    Subjective:  Joan Mclaughlin is a 76 y.o. female who presents today with a chief complaint of cyst on finger.   HPI:  Cyst on finger, acute issue Started about a year ago.  Located on DIP joint of right fourth finger.  Worsened over the last few weeks.  Has gotten bigger and a little more tender.  No home treatments tried.  No obvious precipitating events.  Presyncopal episode, acute issue Patient had an episode about a week ago that lasted for a few moments.  States that she was driving when she felt "the blood draining from her head.".  Shortly before this, patient was at a training session where she was overexerting herself.  Endorses that she does not drink enough fluid.  Patient did not lose consciousness.  No obvious precipitating triggers.  No chest pain or shortness of breath.  No palpitations.  No nausea or vomiting.  No weakness or numbness.  No vision changes.  She has not had any symptoms since then.  ROS: Per HPI  PMH: Smoking history reviewed.  Never smoker.  Objective:  Physical Exam: BP 120/80   Pulse 66   Ht 5\' 2"  (1.575 m)   Wt 144 lb (65.3 kg)   SpO2 97%   BMI 26.34 kg/m   Orthostatic VS for the past 24 hrs:  BP- Lying BP- Sitting BP- Standing at 0 minutes  12/11/16 1432 185/85 168/85 167/87    Gen: NAD, resting comfortably CV: RRR with no murmurs appreciated Pulm: NWOB, CTAB with no crackles, wheezes, or rhonchi MSK: Small 3-4 mm cyst on the ulnar aspect of DIP joint of right fourth finger.  Nontender to palpation. Skin: Warm, dry Neuro: Cranial nerves II through XII intact.  Strength 5 out of 5 in upper and lower extremities. Psych: Normal affect and thought content  Assessment/Plan:  Ganglion cyst Discussed treatment options with patient including aspiration.  Patient elected to defer today.  Discussed reasons to return to care.  We will continue with watchful waiting for now.  Presyncope Likely related to dehydration.  Her orthostatic vital signs  show about a 20 point drop in her systolic blood pressure.  We will check CBC today to rule out anemia.  She does not have any symptoms or physical exam findings concerning for cardiac or neurological etiology.  Reassured patient.  Discussed warning signs and reasons to return to care including shortness of breath, syncope, decreased exercise tolerance, weakness/numbness, and chest pain.  [name]. [name], MD 12/11/2016 2:50 PM

## 2016-12-11 NOTE — Assessment & Plan Note (Signed)
Discussed treatment options with patient including aspiration.  Patient elected to defer today.  Discussed reasons to return to care.  We will continue with watchful waiting for now.

## 2016-12-12 LAB — CBC
HCT: 42.4 % (ref 36.0–46.0)
Hemoglobin: 14.7 g/dL (ref 12.0–15.0)
MCHC: 34.7 g/dL (ref 30.0–36.0)
MCV: 94.1 fl (ref 78.0–100.0)
PLATELETS: 249 10*3/uL (ref 150.0–400.0)
RBC: 4.51 Mil/uL (ref 3.87–5.11)
RDW: 13.2 % (ref 11.5–15.5)
WBC: 6.1 10*3/uL (ref 4.0–10.5)

## 2016-12-13 NOTE — Progress Notes (Signed)
Labs all normal.  No signs of anemia.  Please inform patient.

## 2017-01-14 DIAGNOSIS — K8020 Calculus of gallbladder without cholecystitis without obstruction: Secondary | ICD-10-CM | POA: Insufficient documentation

## 2017-01-14 DIAGNOSIS — K5900 Constipation, unspecified: Secondary | ICD-10-CM | POA: Insufficient documentation

## 2017-01-18 ENCOUNTER — Other Ambulatory Visit: Payer: Self-pay

## 2017-01-18 ENCOUNTER — Telehealth: Payer: Self-pay | Admitting: Family Medicine

## 2017-01-18 MED ORDER — VALACYCLOVIR HCL 500 MG PO TABS: ORAL_TABLET | ORAL | 0 refills | Status: DC

## 2017-01-18 NOTE — Telephone Encounter (Signed)
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  Ok to send in?

## 2017-01-18 NOTE — Telephone Encounter (Signed)
Ok with me. Please place any necessary orders. 

## 2017-01-18 NOTE — Telephone Encounter (Signed)
See note

## 2017-01-18 NOTE — Telephone Encounter (Signed)
Rx sent to pharmacy   

## 2017-01-18 NOTE — Telephone Encounter (Signed)
Copied from [place] [id number]. Topic: Quick Communication - Rx Refill/Question >> Jan 18, 2017  1:20 PM Oliver Pila B wrote: Medication: valACYclovir (VALTREX) 500 MG tablet [[id number]] DISCONTINUED  Has the patient contacted their pharmacy? Yes ----pt will contact to see if a request can be sent  Pt would like to be contacted and advise on what needs to be done so that she can get this Rx filled by Dr. [name]  (Agent: If no, request that the patient contact the pharmacy for the refill.)   Agent: Please be advised that RX refills may take up to 3 business days. We ask that you follow-up with your pharmacy.

## 2017-02-12 ENCOUNTER — Ambulatory Visit: Payer: Medicare Other | Admitting: Neurology

## 2017-02-12 ENCOUNTER — Other Ambulatory Visit: Payer: Self-pay

## 2017-02-12 VITALS — BP 148/68 | HR 77 | Ht 62.0 in | Wt 143.0 lb

## 2017-02-12 DIAGNOSIS — G40009 Localization-related (focal) (partial) idiopathic epilepsy and epileptic syndromes with seizures of localized onset, not intractable, without status epilepticus: Secondary | ICD-10-CM

## 2017-02-12 MED ORDER — OXCARBAZEPINE 300 MG PO TABS: 300.0000 mg | ORAL_TABLET | Freq: Two times a day (BID) | ORAL | 3 refills | Status: DC

## 2017-02-12 NOTE — Patient Instructions (Signed)
1. Continue Trileptal 300mg  twice a day 2. Follow-up in 1 year, call for any changes  Seizure Precautions: 1. If medication has been prescribed for you to prevent seizures, take it exactly as directed.  Do not stop taking the medicine without talking to your doctor first, even if you have not had a seizure in a long time.   2. Avoid activities in which a seizure would cause danger to yourself or to others.  Don't operate dangerous machinery, swim alone, or climb in high or dangerous places, such as on ladders, roofs, or girders.  Do not drive unless your doctor says you may.  3. If you have any warning that you may have a seizure, lay down in a safe place where you can't hurt yourself.    4.  No driving for 6 months from last seizure, as per [facility].   Please refer to the following link on the [place] website for more information: http://[email or web address].org/[email or web address]/driving/drivingu.cfm   5.  Maintain good sleep hygiene. Avoid alcohol.  6.  Contact your doctor if you have any problems that may be related to the medicine you are taking.  7.  Call 911 and bring the patient back to the ED if:        A.  The seizure lasts longer than 5 minutes.       B.  The patient doesn't awaken shortly after the seizure  C.  The patient has new problems such as difficulty seeing, speaking or moving  D.  The patient was injured during the seizure  E.  The patient has a temperature over 102 F (39C)  F.  The patient vomited and now is having trouble breathing

## 2017-02-12 NOTE — Progress Notes (Signed)
NEUROLOGY FOLLOW UP OFFICE NOTE  Joan Mclaughlin [id number]  DOB: [birth year]  HISTORY OF PRESENT ILLNESS: I had the pleasure of seeing Joan Mclaughlin in follow-up in the neurology clinic on 02/27/2016.  The patient was last seen a year ago for seizures. She continues to do well with no seizures since July 2013 on Trileptal 300mg  BID. No side effects. She denies any headaches, dizziness, diplopia, focal numbness/tingling/weakness, olfactory/gustatory hallucinations, myoclonic jerks, staring/unresponsive episodes, gaps in time. Sleep is good, she has her evening routine and has regular sleep cycles. No falls.   HPI: This is a 77yo RH woman with a history of seizures diagnosed in 2008. She was in [place] and was sleep deprived, started feeling strange, "like looking from the outside in," then fell down with a generalized convulsion. She was living in [place] and had seen a neurologist, started on Trileptal, which she had been taking for several years until she self-reduced dose by half. She had been taking this lower dose for 2 years with no seizures until July 2013. At that time, she had been taking lorazepam 1mg  qhs for several years and did not take it the night prior, instead took a different over the counter sleep aid. She woke up with blood on her face and bowel incontinence, then apparently had another seizure that was witnessed by her son with foaming at the mouth. She was started on Keppra but felt weird on this and was put back on Trileptal 300mg  BID since then, with no further seizures.   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 She "always fainted" in church. In college, she was drinking the night before and recalls looking in the mirror, then found herself on the floor. She denies any gaps in time, no staring/unresponsive episodes, olfactory/gustatory hallucinations, focal numbness/tingling/weakness, myoclonic  jerks. She has never been a good sleeper, and has tried all the over the counter medications with no effect. She has tried Ambien (felt hungover), Lunesta and Melatonin gave her weird nightmarish thoughts. She has been taking lorazepam nightly for at least 3 or 4 years, and this is the only medication that helps her without feeling hungover the next day.   Epilepsy Risk Factors: Her brother has seizures. Another brother may have seizures also, he takes phenobarbital. Her nephew may have seizures as well. Otherwise she had a normal birth and early development. There is no history of febrile convulsions, CNS infections such as meningitis/encephalitis, significant traumatic brain injury, neurosurgical procedures.  Diagnostic Data: Routine EEG by Dr. Sonny Masters 07/2005: Left posterior frontal sharp wave activity with phase reversal at F7. Left frontal sharp and slow activity. Abnormal EEG with posterior frontal low activity compatible with seizure disorder with left frontal focus.  Routine EEG by Dr. Sonny Masters 06/2007: Spike activity with phase reversal noted at [facility]. Hyperventilation denoted spike activity. Impression: Left frontal region irritative lesion compatible with underlying seizures.  Routine EEG by Dr. Sonny Masters 05/2009: Left temporal spike activity noted with phase reversal T3. Impression: Abnormal due to irritative lesion left temporal region compatible with underlying seizure disorder.  MRI brain 04/2006: There are scattered punctate foci of signal abnormality in the cerebral white matter bilaterally. These findings are consistent with mild microvascular ischemia.  Per Dr. Prentice Docker note: "Reviewed MRI head, MRI brain from March 28, 2006 demonstrates atrophy in the medial temporal lobes, more prominent on the right." She had a seizure in July 2013 felt to be provoked by  diphenhydramine and low dose Trileptal  PAST MEDICAL HISTORY: Past Medical History:  Diagnosis Date  . Allergy   . Anxiety   .  Cholecystitis   . Difficulty sleeping    takes med to sleep  . Diverticulitis   . GERD (gastroesophageal reflux disease)    "RELATED TO GALLBLADDER"  . Heart murmur    "ONLY DETECTED WHEN LYING DOWN"  . Hyperlipidemia   . Lyme disease    Pt states she has had Lyme Disease twice.   . Nocturia   . Seizures ([place])     MEDICATIONS: Current Outpatient Medications on File Prior to Visit  Medication Sig Dispense Refill  . ALPRAZolam (XANAX) 0.25 MG tablet Take 0.25 mg by mouth at bedtime as needed for anxiety.    . Calcium Carbonate-Vit D-Min (CALCIUM 1200) 1200-1000 MG-UNIT CHEW Chew by mouth daily.    . cyanocobalamin 1000 MCG tablet Take 1,000 mcg by mouth daily.    . Digestive Enzymes (DIGEST II PO) Take by mouth 3 (three) times daily.    . hydrOXYzine (ATARAX/VISTARIL) 10 MG tablet as needed.     . Multiple Vitamins-Minerals (MULTIVITAMIN WITH MINERALS) tablet Take 1 tablet by mouth daily.    . Omega-3 Fatty Acids (FISH OIL PO) Take by mouth 2 (two) times daily.    . Oxcarbazepine (TRILEPTAL) 300 MG tablet Take 1 tablet (300 mg total) by mouth 2 (two) times daily. 180 tablet 3  . Red Yeast Rice Extract (RED YEAST RICE PO) Take by mouth 2 (two) times daily.    Marland Kitchen saccharomyces boulardii (FLORASTOR) 250 MG capsule Take 1 capsule (250 mg total) by mouth 2 (two) times daily. (Patient taking differently: Take 250 mg by mouth daily. ) 20 capsule 0  . valACYclovir (VALTREX) 500 MG tablet Take [id number] tablets as needed for cold sore outbreak 30 tablet 0   No current facility-administered medications on file prior to visit.     ALLERGIES: Allergies  Allergen Reactions  . Amoxicillin-Pot Clavulanate Other (See Comments)  . Suvorexant Other (See Comments)    FAMILY HISTORY: Family History  Problem Relation Age of Onset  . Arthritis Father   . Alcohol abuse Mother   . Diabetes Mother   . Heart disease Mother   . Hypertension Mother   . Stroke Mother   . Depression Sister   .  Hearing loss Sister   . Hyperlipidemia Sister   . Alcohol abuse Daughter   . Alcohol abuse Son   . Depression Sister   . Early death Sister   . Alcohol abuse Brother   . Depression Sister   . Heart disease Sister     SOCIAL HISTORY: Social History   Socioeconomic History  . Marital status: Single    Spouse name: Not on file  . Number of children: Not on file  . Years of education: 30  . Highest education level: Not on file  Social Needs  . Financial resource strain: Not on file  . Food insecurity - worry: Not on file  . Food insecurity - inability: Not on file  . Transportation needs - medical: Not on file  . Transportation needs - non-medical: Not on file  Occupational History  . Occupation: retired  Tobacco Use  . Smoking status: Never Smoker  . Smokeless tobacco: Never Used  Substance and Sexual Activity  . Alcohol use: No    Alcohol/week: 0.0 oz  . Drug use: No  . Sexual activity: No  Other Topics Concern  . Not  on file  Social History Narrative   Patient lives alone.   patient has 4 grandchildren.   Patient is retired   Patient has 2 years of college.   Patient is right handed.          REVIEW OF SYSTEMS: Constitutional: No fevers, chills, or sweats, no generalized fatigue, change in appetite Eyes: No visual changes, double vision, eye pain Ear, nose and throat: No hearing loss, ear pain, nasal congestion, sore throat Cardiovascular: No chest pain, palpitations Respiratory:  No shortness of breath at rest or with exertion, wheezes GastrointestinaI: No nausea, vomiting, diarrhea, abdominal pain, fecal incontinence Genitourinary:  No dysuria, urinary retention or frequency Musculoskeletal:  No neck pain, back pain Integumentary: No rash, pruritus, skin lesions Neurological: as above Psychiatric: No depression, +insomnia,no anxiety Endocrine: No palpitations, fatigue, diaphoresis, mood swings, change in appetite, change in weight, increased  thirst Hematologic/Lymphatic:  No anemia, purpura, petechiae. Allergic/Immunologic: no itchy/runny eyes, nasal congestion, recent allergic reactions, rashes  PHYSICAL EXAM: Vitals:   02/12/17 1338  BP: (!) 148/68  Pulse: 77  SpO2: 96%   General: No acute distress Head:  Normocephalic/atraumatic Neck: supple, no paraspinal tenderness, full range of motion Heart:  Regular rate and rhythm Lungs:  Clear to auscultation bilaterally Back: No paraspinal tenderness Skin/Extremities: No rash, no edema Neurological Exam: alert and oriented to person, place, and time. No aphasia or dysarthria. Fund of knowledge is appropriate.  Recent and remote memory are intact.  Attention and concentration are normal.    Able to name objects and repeat phrases. Cranial nerves: Pupils equal, round, reactive to light.  Extraocular movements intact with no nystagmus. Visual fields full. Facial sensation intact. No facial asymmetry. Tongue, uvula, palate midline.  Motor: Bulk and tone normal, muscle strength 5/5 throughout with no pronator drift.  Sensation to light touch intact.  No extinction to double simultaneous stimulation.  Deep tendon reflexes 2+ throughout, toes downgoing.  Finger to nose testing intact.  Gait narrow-based and steady, able to tandem walk adequately.  Romberg negative.  IMPRESSION: This is a 77 yo RH woman with a history of episodes of loss of consciousness as a teenager, and 2 witnessed convulsions in 2008 and 2013. The last seizure occurred in the setting of reduced Trileptal dose and not taking lorazepam the night prior. Prior EEGs have shown left frontotemporal sharp waves, suggestive of focal to bilateral tonic-clonic seizures. No seizures since 2013. Refills for Trileptal 300mg  BID were sent. She is aware of [place] driving laws, and she knows to stop driving after a seizure, until 6 months seizure-free. She will follow-up in 1 year and knows to call our office for any problems in the  interim.  Thank you for allowing me to participate in her care.  Please do not hesitate to call for any questions or concerns.  The duration of this appointment visit was 15 minutes of face-to-face time with the patient.  Greater than 50% of this time was spent in counseling, explanation of diagnosis, planning of further management, and coordination of care.   Joan Mclaughlin, M.D.   CC: Dr. [name]

## 2017-02-19 ENCOUNTER — Telehealth: Payer: Self-pay | Admitting: Family Medicine

## 2017-02-19 NOTE — Telephone Encounter (Signed)
See note

## 2017-02-19 NOTE — Telephone Encounter (Signed)
Please call pt and schedule an appointment to discuss medication per Dr. [name].

## 2017-02-19 NOTE — Telephone Encounter (Signed)
Dr. [name], pt requesting refill on Alprazolam 0.25 mg you have never prescribed for pt.

## 2017-02-19 NOTE — Telephone Encounter (Signed)
Copied from [place] [id number]. Topic: Quick Communication - Rx Refill/Question >> Feb 19, 2017 12:55 PM Scherrie Gerlach wrote: Medication:  ALPRAZolam Duanne Moron) 0.25 MG tablet   Has the patient contacted their pharmacy? Yes, but prescibed by a previous provider, and Dr [name] has never filled  [place] [place] [address] [phone] (Phone) [phone] (Fax)

## 2017-02-19 NOTE — Telephone Encounter (Signed)
Needs appt to discuss

## 2017-02-19 NOTE — Telephone Encounter (Signed)
Please schedule follow-up appointment

## 2017-02-20 ENCOUNTER — Encounter: Payer: Self-pay | Admitting: Neurology

## 2017-02-22 NOTE — Telephone Encounter (Signed)
Patient is scheudled

## 2017-02-25 ENCOUNTER — Encounter: Payer: Self-pay | Admitting: Family Medicine

## 2017-02-25 ENCOUNTER — Ambulatory Visit (INDEPENDENT_AMBULATORY_CARE_PROVIDER_SITE_OTHER): Payer: Medicare Other | Admitting: Family Medicine

## 2017-02-25 DIAGNOSIS — G4700 Insomnia, unspecified: Secondary | ICD-10-CM

## 2017-02-25 MED ORDER — ALPRAZOLAM 0.25 MG PO TABS: 0.2500 mg | ORAL_TABLET | Freq: Every evening | ORAL | 1 refills | Status: DC | PRN

## 2017-02-25 NOTE — Progress Notes (Signed)
    Subjective:  Joan Mclaughlin is a 77 y.o. female who presents today with a chief complaint of insomnia.   HPI:  Insomnia, New Problem Several year history.  Stable over the last several months.  Currently uses Xanax 0.25 mg nightly.  She has been stable on this dose for several years.  No side effects noted.  No falls.  No forgetfulness.  She has been on other medications in the past including bowel summary and hydroxyzine.  No other obvious alleviating or aggravating factors.  ROS: Per HPI  PMH: She reports that  has never smoked. she has never used smokeless tobacco. She reports that she does not drink alcohol or use drugs.   Objective:  Physical Exam: BP (!) 126/54   Pulse 75   Temp 98.6 F (37 C) (Oral)   Wt 143 lb 9.6 oz (65.1 kg)   SpO2 95%   BMI 26.26 kg/m   Gen: NAD, resting comfortably CV: RRR with no murmurs appreciated Psych: Normal affect and thought content  Assessment/Plan:  Insomnia Refilled patient's Xanax today.  Her database was reviewed without red flags.  Discussed long-term complications of this medication including respiratory depression, falls, and memory loss.  We will attempt to wean off Xanax.  She will try taking half a tablet nightly for the next 2 weeks.  After that she will try taking a quarter of a tablet nightly.  Advised her that she could try taking hydroxyzine to help with her sleep as she was weaning off the Xanax.  She will follow-up with me in 6 months, or sooner as needed.  [name]. [name], MD 02/25/2017 4:04 PM

## 2017-02-25 NOTE — Assessment & Plan Note (Signed)
Refilled patient's Xanax today.  Her database was reviewed without red flags.  Discussed long-term complications of this medication including respiratory depression, falls, and memory loss.  We will attempt to wean off Xanax.  She will try taking half a tablet nightly for the next 2 weeks.  After that she will try taking a quarter of a tablet nightly.  Advised her that she could try taking hydroxyzine to help with her sleep as she was weaning off the Xanax.  She will follow-up with me in 6 months, or sooner as needed.

## 2017-02-25 NOTE — Patient Instructions (Signed)
Please try cutting down xanax by half over the next couple of weeks. You can also decrease by another half if this goes well.  Come back to see me in 6 months, or sooner as needed.  Take care,  Dr [name]

## 2017-02-28 ENCOUNTER — Ambulatory Visit: Payer: Medicare Other | Admitting: Neurology

## 2017-07-13 ENCOUNTER — Other Ambulatory Visit: Payer: Self-pay | Admitting: Family Medicine

## 2017-08-06 ENCOUNTER — Telehealth: Payer: Self-pay | Admitting: Neurology

## 2017-08-06 NOTE — Telephone Encounter (Signed)
LMOM asking pt to return call.  

## 2017-08-06 NOTE — Telephone Encounter (Signed)
Pt has some questions regarding OTC meds interaction with her prescriptions. Pls call her

## 2017-08-06 NOTE — Telephone Encounter (Signed)
Spoke with pt.  She states that the other night she took a Unisom to help her sleep.  Her son later told her that she should not take Unisom and Trileptal at the same time.  Pt was checking on that.    Verbal per Dr. Delice Lesch - OK to take Unisom occasionally, NOT everyday.  Will relay message to pt.

## 2017-08-13 ENCOUNTER — Telehealth: Payer: Self-pay | Admitting: Family Medicine

## 2017-08-13 ENCOUNTER — Encounter: Payer: Self-pay | Admitting: Family Medicine

## 2017-08-13 ENCOUNTER — Ambulatory Visit (INDEPENDENT_AMBULATORY_CARE_PROVIDER_SITE_OTHER): Payer: Medicare Other | Admitting: Family Medicine

## 2017-08-13 VITALS — BP 124/72 | HR 73 | Temp 98.7°F | Ht 62.0 in | Wt 143.2 lb

## 2017-08-13 DIAGNOSIS — H9201 Otalgia, right ear: Secondary | ICD-10-CM | POA: Diagnosis not present

## 2017-08-13 DIAGNOSIS — G4700 Insomnia, unspecified: Secondary | ICD-10-CM

## 2017-08-13 MED ORDER — CIPROFLOXACIN-DEXAMETHASONE 0.3-0.1 % OT SUSP: 4.0000 [drp] | Freq: Two times a day (BID) | OTIC | 0 refills | Status: DC

## 2017-08-13 MED ORDER — TRAZODONE HCL 50 MG PO TABS: 50.0000 mg | ORAL_TABLET | Freq: Every evening | ORAL | 3 refills | Status: DC | PRN

## 2017-08-13 NOTE — Progress Notes (Signed)
   Subjective:  Joan Mclaughlin is a 77 y.o. female who presents today with a chief complaint of insomnia.   HPI:  Insomnia, chronic problem, stable Currently on hydroxyzine 5 mg and Xanax 0.125 mg as needed.  Does not think that either of these medications were particularly well.  She has tried several medications in the past including nortriptyline, Ambien and Lunesta without significant improvement.  She would like to stop both hydroxyzine and Xanax today.  She is interested in trying other medications.  Ear Pain, acute problem Symptoms started 4 days ago and have worsened over that time.  Pain located in the right right ear.  No hearing loss.  No fevers or chills.  No specific treatments tried.  No obvious alleviating or aggravating factors.  Feels similar to prior similar ear infections.  ROS: Per HPI  PMH: She reports that she has never smoked. She has never used smokeless tobacco. She reports that she does not drink alcohol or use drugs.  Objective:  Physical Exam: BP 124/72 (BP Location: Left Arm, Patient Position: Sitting, Cuff Size: Normal)   Pulse 73   Temp 98.7 F (37.1 C) (Oral)   Ht 5\' 2"  (1.575 m)   Wt 143 lb 3.2 oz (65 kg)   SpO2 97%   BMI 26.19 kg/m   Gen: NAD, resting comfortably HEENT: Right EAC erythematous and inflamed.  Right TM clear.  Left EAC and TM clear. CV: RRR with no murmurs appreciated Pulm: NWOB, CTAB with no crackles, wheezes, or rhonchi  Assessment/Plan:  Insomnia We will stop her Xanax and hydroxyzine today.  Discussed other treatment options with patient.  Will start trazodone 50 mg nightly today.  Consider trial of Remeron at the future if no improvement.  May ultimately need referral back to sleep medicine who she has seen in the past.  Right ear pain No clear evidence of infection.  Will start Ciprodex drops to treat underlying inflammation and any possible bacterial infection.  Discussed reasons to return to care.  Follow-up as  needed.  [name]. [name], MD 08/13/2017 2:27 PM

## 2017-08-13 NOTE — Telephone Encounter (Signed)
See note

## 2017-08-13 NOTE — Telephone Encounter (Signed)
Please advise 

## 2017-08-13 NOTE — Patient Instructions (Signed)
It was very nice to see you today!  Please start the trazadone 50mg  nightly for your sleep. You can stop the xanax and hydroxyzine.  Please start the ear drops.  Come back in 2-3 months for your annual wellness visit, or sooner as needed.   Take care, Dr [name]

## 2017-08-13 NOTE — Telephone Encounter (Signed)
Copied from [place] [id number]. Topic: General - Other >> Aug 13, 2017  4:02 PM Judyann Munson wrote: Reason for CRM: Patient is requesting a call back in regards to her new medication  traZODone (DESYREL) 50 MG tablet she is concern this medication will affect the medication that she is taking Oxcarbazepine (TRILEPTAL) 300 MG tablet. Her best contact number is [phone]. Please advise

## 2017-08-13 NOTE — Assessment & Plan Note (Signed)
We will stop her Xanax and hydroxyzine today.  Discussed other treatment options with patient.  Will start trazodone 50 mg nightly today.  Consider trial of Remeron at the future if no improvement.  May ultimately need referral back to sleep medicine who she has seen in the past.

## 2017-08-14 NOTE — Telephone Encounter (Signed)
I am not aware of any interaction between these two medications. I searched a database and could not find any interaction either. She will be taking a very lose dose of trazodone and I do not expect her to have any significant side effects aside from possible drowsiness.  [name]. [name], MD 08/14/2017 7:57 AM

## 2017-08-14 NOTE — Telephone Encounter (Signed)
LM on patient's voicemail answering her question.  Ok to do so per patient's DPR, and patient identified self on voicemail.

## 2017-08-26 ENCOUNTER — Ambulatory Visit: Payer: Medicare Other | Admitting: Family Medicine

## 2017-12-03 ENCOUNTER — Ambulatory Visit: Payer: Medicare Other

## 2017-12-03 ENCOUNTER — Ambulatory Visit (INDEPENDENT_AMBULATORY_CARE_PROVIDER_SITE_OTHER): Payer: Medicare Other | Admitting: *Deleted

## 2017-12-03 VITALS — BP 138/88 | HR 68 | Resp 16 | Ht 62.0 in | Wt 147.8 lb

## 2017-12-03 DIAGNOSIS — Z0000 Encounter for general adult medical examination without abnormal findings: Secondary | ICD-10-CM | POA: Diagnosis not present

## 2017-12-03 NOTE — Progress Notes (Signed)
I have personally reviewed the Medicare Annual Wellness Visit and agree with the assessment and plan.  [name]. [name], MD 12/03/2017 4:30 PM

## 2017-12-03 NOTE — Patient Instructions (Signed)
  Joan Mclaughlin , Thank you for taking time to come for your Medicare Wellness Visit. I appreciate your ongoing commitment to your health goals. Please review the following plan we discussed and let me know if I can assist you in the future.   These are the goals we discussed: Goals   None     This is a list of the screening recommended for you and due dates:  Health Maintenance  Topic Date Due  . Flu Shot  Completed  . Pneumonia vaccines  Completed  . DEXA scan (bone density measurement)  Discontinued  . Tetanus Vaccine  Discontinued

## 2017-12-03 NOTE — Progress Notes (Signed)
Subjective:   Joan Mclaughlin is a 77 y.o. female who presents for Medicare Annual (Subsequent) preventive examination.  Lives in one story home alone.   Review of Systems:  No ROS.  Medicare Wellness Visit. Additional risk factors are reflected in the social history.  Cardiac Risk Factors include: advanced age (>34men, >28 women);dyslipidemia     Objective:     Vitals: BP 138/88   Pulse 68   Resp 16   Ht 5\' 2"  (1.575 m)   Wt 147 lb 12.8 oz (67 kg)   SpO2 98%   BMI 27.03 kg/m   Body mass index is 27.03 kg/m.  Advanced Directives 12/03/2017 11/27/2016 12/23/2014 12/23/2014 12/21/2014 11/07/2014  Does Patient Have a Medical Advance Directive? Yes No Yes - Yes Yes  Type of Paramedic of [place];Living will - [place];Living will - [place];Living will Living will  Does patient want to make changes to medical advance directive? No - Patient declined - - - - -  Copy of [place] in Chart? Yes - validated most recent copy scanned in chart (See row information) - No - copy requested (No Data) No - copy requested No - copy requested  Would patient like information on creating a medical advance directive? - Yes (MAU/Ambulatory/Procedural Areas - Information given) - - - -    Tobacco Social History   Tobacco Use  Smoking Status Never Smoker  Smokeless Tobacco Never Used     Counseling given: Not Answered  Past Medical History:  Diagnosis Date  . Allergy   . Anxiety   . Cholecystitis   . Difficulty sleeping    takes med to sleep  . Diverticulitis   . GERD (gastroesophageal reflux disease)    "RELATED TO GALLBLADDER"  . Heart murmur    "ONLY DETECTED WHEN LYING DOWN"  . Hyperlipidemia   . Lyme disease    Pt states she has had Lyme Disease twice.   . Nocturia   . Seizures ([place])    Past Surgical History:  Procedure Laterality Date  . BACK SURGERY  07/2007  . CHOLECYSTECTOMY N/A  12/23/2014   Procedure: LAPAROSCOPIC CHOLECYSTECTOMY WITH INTRAOPERATIVE CHOLANGIOGRAM;  Surgeon: Greer Pickerel, MD;  Location: [facility];  Service: General;  Laterality: N/A;  . MOUTH SURGERY    . TUBAL LIGATION     Family History  Problem Relation Age of Onset  . Arthritis Father   . Alcohol abuse Mother   . Diabetes Mother   . Heart disease Mother   . Hypertension Mother   . Stroke Mother   . Depression Sister   . Hearing loss Sister   . Hyperlipidemia Sister   . Alcohol abuse Daughter   . Alcohol abuse Son   . Depression Sister   . Early death Sister   . Alcohol abuse Brother   . Depression Sister   . Heart disease Sister    Social History   Socioeconomic History  . Marital status: Single    Spouse name: Not on file  . Number of children: Not on file  . Years of education: 55  . Highest education level: Not on file  Occupational History  . Occupation: retired    Comment: Accounts payable and nanny   Social Needs  . Financial resource strain: Not on file  . Food insecurity:    Worry: Not on file    Inability: Not on file  . Transportation needs:  Medical: Not on file    Non-medical: Not on file  Tobacco Use  . Smoking status: Never Smoker  . Smokeless tobacco: Never Used  Substance and Sexual Activity  . Alcohol use: No    Alcohol/week: 0.0 standard drinks  . Drug use: No  . Sexual activity: Never  Lifestyle  . Physical activity:    Days per week: Not on file    Minutes per session: Not on file  . Stress: Not on file  Relationships  . Social connections:    Talks on phone: Not on file    Gets together: Not on file    Attends religious service: Not on file    Active member of club or organization: Not on file    Attends meetings of clubs or organizations: Not on file    Relationship status: Not on file  Other Topics Concern  . Not on file  Social History Narrative   Patient lives alone.   patient has 4 grandchildren.   Patient is retired   Patient  has 2 years of college.   Patient is right handed.          Outpatient Encounter Medications as of 12/03/2017  Medication Sig  . ALPRAZolam (XANAX) 0.25 MG tablet Take 0.125 mg by mouth at bedtime as needed for anxiety.  . Calcium Carbonate-Vit D-Min (CALCIUM 1200) 1200-1000 MG-UNIT CHEW Chew by mouth daily.  . cyanocobalamin 1000 MCG tablet Take 1,000 mcg by mouth daily.  . hydrOXYzine (ATARAX/VISTARIL) 10 MG tablet Take 10 mg by mouth as needed (sleep).  . Multiple Vitamins-Minerals (MULTIVITAMIN WITH MINERALS) tablet Take 1 tablet by mouth daily.  . NON FORMULARY Take 1 tablet by mouth once.  . Omega-3 Fatty Acids (FISH OIL PO) Take by mouth daily.   . Oxcarbazepine (TRILEPTAL) 300 MG tablet Take 1 tablet (300 mg total) by mouth 2 (two) times daily.  . Red Yeast Rice Extract (RED YEAST RICE PO) Take by mouth daily.   Marland Kitchen saccharomyces boulardii (FLORASTOR) 250 MG capsule Take 1 capsule (250 mg total) by mouth 2 (two) times daily. (Patient taking differently: Take 250 mg by mouth daily. )  . valACYclovir (VALTREX) 500 MG tablet TAKE 1 TO 2 TABLETS BY MOUTH AS NEEDED FOR COLD SORE OUTBREAK  . [DISCONTINUED] ciprofloxacin-dexamethasone (CIPRODEX) OTIC suspension Place 4 drops into the right ear 2 (two) times daily.  . [DISCONTINUED] Digestive Enzymes (DIGEST II PO) Take by mouth 3 (three) times daily.  . [DISCONTINUED] traZODone (DESYREL) 50 MG tablet Take 1 tablet (50 mg total) by mouth at bedtime as needed for sleep.   No facility-administered encounter medications on file as of 12/03/2017.     Activities of Daily Living In your present state of health, do you have any difficulty performing the following activities: 12/03/2017  Hearing? N  Vision? N  Difficulty concentrating or making decisions? N  Walking or climbing stairs? N  Dressing or bathing? N  Doing errands, shopping? N  Preparing Food and eating ? N  Using the Toilet? N  In the past six months, have you accidently leaked  urine? N  Do you have problems with loss of bowel control? N  Managing your Medications? N  Managing your Finances? N  Housekeeping or managing your Housekeeping? N  Some recent data might be hidden    Patient Care Team: Vivi Barrack, MD as PCP - General (Family Medicine) Marilynne Halsted, MD as Referring Physician (Ophthalmology) Laurence Spates, MD as Consulting Physician (Gastroenterology) Robyne Askew  Alfonso Patten, PA-C as Physician Assistant (Dermatology) Cameron Sprang, MD as Consulting Physician (Neurology)    Assessment:   This is a routine wellness examination for [place].  Exercise Activities and Dietary recommendations Current Exercise Habits: Structured exercise class;Home exercise routine(Patient does some at home and some silver sneakers classes), Type of exercise: strength training/weights;stretching, Time (Minutes): 25, Frequency (Times/Week): 4, Weekly Exercise (Minutes/Week): 100, Intensity: Moderate, Exercise limited by: None identified   Patient is up about 5 pounds since august. She believes it is from desserts and salt. We discussed ways to decrease her salt and sugar intake and increase walking in order to lose weight and lower cholesterol.  Breakfast: oatmeal, 1-2 cups coffee Brunch: egg bites and coffee Dinner: Hot bar at [employer]. Usually vegetables, occasional meat.  Dessert: Ice cream, small serving sizes. Small piece of candy.   Goals   None     Fall Risk Fall Risk  12/03/2017 02/12/2017 11/27/2016 09/19/2016 02/27/2016  Falls in the past year? 0 No No No No   Depression Screen PHQ 2/9 Scores 12/03/2017 11/27/2016 09/19/2016  PHQ - 2 Score 0 0 0  PHQ- 9 Score 0 0 -  PHQ9 completed. Score 0. No signs or symptoms of depression. Patient is very close with her son in [place]. She is looking forward to him coming to visit her in a week. Total time spent on topic was 9 minutes.   Cognitive Function Ad8 score reviewed for issues:  Issues making  decisions:0  Less interest in hobbies / activities:0  Repeats questions, stories (family complaining):0  Trouble using ordinary gadgets (microwave, computer, phone):0  Forgets the month or year: 0  Mismanaging finances: 0  Remembering appts:0  Daily problems with thinking and/or memory:0 Ad8 score is=0 Patient reads and does puzzles in the newspaper.         Immunization History  Administered Date(s) Administered  . Influenza-Unspecified 10/22/2017  . Pneumococcal Conjugate-13 05/16/2016  . Pneumococcal Polysaccharide-23 06/25/2013   Screening Tests Health Maintenance  Topic Date Due  . INFLUENZA VACCINE  Completed  . PNA vac Low Risk Adult  Completed  . DEXA SCAN  Discontinued  . TETANUS/TDAP  Discontinued      Plan:   Follow up with PCP as directed.  I have personally reviewed and noted the following in the patient's chart:   . Medical and social history . Use of alcohol, tobacco or illicit drugs  . Current medications and supplements . Functional ability and status . Nutritional status . Physical activity . Advanced directives . List of other physicians . Vitals . Screenings to include cognitive, depression, and falls . Referrals and appointments  In addition, I have reviewed and discussed with patient certain preventive protocols, quality metrics, and best practice recommendations. A written personalized care plan for preventive services as well as general preventive health recommendations were provided to patient.     Williemae Area, RN  12/03/2017

## 2017-12-03 NOTE — Progress Notes (Signed)
PCP notes:   Health maintenance: Up to date.   Abnormal screenings: None.   Patient concerns: Patient is up about 5 pounds. We discussed decreasing salt and sugar intake. Increasing water intake. She also has not had her cholesterol checked in over a year. She does not want to have to start medication. She takes red yeast rice and fish oil. Patient agreed to come in for follow up appointment in January to have labs rechecked. She will work on diet and weight loss until appointment.   Nurse concerns: None.   Next PCP appt: 02/03/2018

## 2018-01-04 ENCOUNTER — Other Ambulatory Visit: Payer: Self-pay | Admitting: Family Medicine

## 2018-01-10 ENCOUNTER — Ambulatory Visit (INDEPENDENT_AMBULATORY_CARE_PROVIDER_SITE_OTHER): Payer: Medicare Other | Admitting: Family Medicine

## 2018-01-10 VITALS — BP 126/74 | HR 75 | Temp 98.6°F | Ht 62.0 in | Wt 147.0 lb

## 2018-01-10 DIAGNOSIS — H02834 Dermatochalasis of left upper eyelid: Secondary | ICD-10-CM

## 2018-01-10 DIAGNOSIS — H01004 Unspecified blepharitis left upper eyelid: Secondary | ICD-10-CM

## 2018-01-10 DIAGNOSIS — H01001 Unspecified blepharitis right upper eyelid: Secondary | ICD-10-CM

## 2018-01-10 DIAGNOSIS — H02831 Dermatochalasis of right upper eyelid: Secondary | ICD-10-CM | POA: Diagnosis not present

## 2018-01-10 DIAGNOSIS — Z1211 Encounter for screening for malignant neoplasm of colon: Secondary | ICD-10-CM

## 2018-01-10 DIAGNOSIS — Z1239 Encounter for other screening for malignant neoplasm of breast: Secondary | ICD-10-CM

## 2018-01-10 MED ORDER — POLYETHYL GLYC-PROPYL GLYC PF 0.4-0.3 % OP SOLN: 1.0000 [drp] | Freq: Three times a day (TID) | OPHTHALMIC | 0 refills | Status: DC | PRN

## 2018-01-10 MED ORDER — ERYTHROMYCIN 5 MG/GM OP OINT: 1.0000 "application " | TOPICAL_OINTMENT | Freq: Three times a day (TID) | OPHTHALMIC | 1 refills | Status: DC

## 2018-01-10 NOTE — Progress Notes (Signed)
Joan Mclaughlin is a 78 y.o. female here for an acute visit.  History of Present Illness:   Lonell Grandchild, CMA acting as scribe for Dr. Briscoe Deutscher.   Eye Problem   The left (Lt >Rt ) eye is affected. This is a new problem. The current episode started 1 to 4 weeks ago (around two weeks. ). The problem occurs constantly. The problem has been gradually worsening. There was no injury mechanism. The pain is at a severity of 1/10. The patient is experiencing no pain. There is no known exposure to pink eye. She does not wear contacts. Associated symptoms include itching. Pertinent negatives include no blurred vision, eye discharge, fever or photophobia. Treatments tried: some old antibiotic eye creams  The treatment provided no relief.   PMHx, SurgHx, SocialHx, Medications, and Allergies were reviewed in the Visit Navigator and updated as appropriate.  Current Medications:   .  ALPRAZolam (XANAX) 0.25 MG tablet, Take 0.125 mg by mouth at bedtime as needed for anxiety., Disp: , Rfl:  .  Calcium Carbonate-Vit D-Min (CALCIUM 1200) 1200-1000 MG-UNIT CHEW, Chew by mouth daily., Disp: , Rfl:  .  cyanocobalamin 1000 MCG tablet, Take 1,000 mcg by mouth daily., Disp: , Rfl:  .  hydrOXYzine (ATARAX/VISTARIL) 10 MG tablet, Take 10 mg by mouth as needed (sleep)., Disp: , Rfl:  .  Multiple Vitamins-Minerals (MULTIVITAMIN WITH MINERALS) tablet, Take 1 tablet by mouth daily., Disp: , Rfl:  .  NON FORMULARY, Take 1 tablet by mouth once., Disp: , Rfl:  .  Omega-3 Fatty Acids (FISH OIL PO), Take by mouth daily. , Disp: , Rfl:  .  Oxcarbazepine (TRILEPTAL) 300 MG tablet, Take 1 tablet (300 mg total) by mouth 2 (two) times daily., Disp: 180 tablet, Rfl: 3 .  Red Yeast Rice Extract (RED YEAST RICE PO), Take by mouth daily. , Disp: , Rfl:  .  saccharomyces boulardii (FLORASTOR) 250 MG capsule, Take 1 capsule (250 mg total) by mouth 2 (two) times daily. (Patient taking differently: Take 250 mg by mouth daily. ),  Disp: 20 capsule, Rfl: 0 .  valACYclovir (VALTREX) 500 MG tablet, TAKE 1 TO 2 TABLETS BY MOUTH AS NEEDED FOR COLD SORE OUTBREAK, Disp: 30 tablet, Rfl: 0   Allergies  Allergen Reactions  . Amoxicillin-Pot Clavulanate Other (See Comments)  . Suvorexant Other (See Comments)   Review of Systems:   Pertinent items are noted in the HPI. Otherwise, ROS is negative.  Vitals:   Vitals:   01/10/18 1520  BP: 126/74  Pulse: 75  Temp: 98.6 F (37 C)  TempSrc: Oral  SpO2: 96%  Weight: 147 lb (66.7 kg)  Height: 5\' 2"  (1.575 m)     Body mass index is 26.89 kg/m.  Physical Exam:   Physical Exam Vitals signs and nursing note reviewed.  Constitutional:      General: She is not in acute distress.    Appearance: Normal appearance.  HENT:     Head: Normocephalic and atraumatic. No right periorbital erythema or left periorbital erythema.     Right Ear: External ear normal.     Left Ear: External ear normal.     Nose: Nose normal.  Eyes:     General: Lids are everted, no foreign bodies appreciated. Vision grossly intact. Gaze aligned appropriately.        Right eye: No discharge or hordeolum.        Left eye: No discharge or hordeolum.     Extraocular Movements: Extraocular movements  intact.     Conjunctiva/sclera: Conjunctivae normal.     Comments: Dry, irritated skin of upper lid and lateral canthus.  Cardiovascular:     Rate and Rhythm: Normal rate and regular rhythm.  Pulmonary:     Effort: Pulmonary effort is normal.  Neurological:     General: No focal deficit present.     Mental Status: She is alert.    Assessment and Plan:   Marcina was seen today for eye problem. I believe that her issue today is irritation of the very thin, delicate skin around eyes. She has been using the fireplace more often, may be drying eyes and causing extra tearing. Discussed using eye drops as below. Use barrier ointment to protect skin (vaseline). Okay emycin ointment as safety net.   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 -     Polyethyl Glyc-Propyl Glyc PF (SYSTANE PRESERVATIVE FREE) 0.4-0.3 % SOLN; Apply 1 drop to eye 3 (three) times daily as needed.  Screening for breast cancer -     MM 3D SCREEN BREAST BILATERAL; Future  Screening for colon cancer -     Ambulatory referral to Gastroenterology  Dermatochalasis of both upper eyelids    . Reviewed expectations re: course of current medical issues. . Discussed self-management of symptoms. . Outlined signs and symptoms indicating need for more acute intervention. . Patient verbalized understanding and all questions were answered. Marland Kitchen Health Maintenance issues including appropriate healthy diet, exercise, and smoking avoidance were discussed with patient. . See orders for this visit as documented in the electronic medical record. . Patient received an After Visit Summary.  CMA served as Education administrator during this visit. History, Physical, and Plan performed by medical provider. The above documentation has been reviewed and is accurate and complete. Briscoe Deutscher, D.O.  Briscoe Deutscher, DO [place], [facility] 01/12/2018

## 2018-01-12 ENCOUNTER — Encounter: Payer: Self-pay | Admitting: Family Medicine

## 2018-01-12 DIAGNOSIS — H02831 Dermatochalasis of right upper eyelid: Secondary | ICD-10-CM | POA: Insufficient documentation

## 2018-01-12 DIAGNOSIS — H02834 Dermatochalasis of left upper eyelid: Secondary | ICD-10-CM

## 2018-01-13 ENCOUNTER — Ambulatory Visit: Payer: Self-pay | Admitting: Family Medicine

## 2018-01-13 NOTE — Telephone Encounter (Signed)
Patient is wanting to know why refill request for xanax has been denied.  See refill encounter on 01/04/18 / After review of chart, on OV 08/13/2017 with Dr. [name], the xanax was discontinued / Patient states she does not take any type of sleeping pill and she feels the xanax works best for her.  Patient states she has discussed this with Dr. [name] previously. / Patient would like xanax called in again.  Pharmacy verified  Reason for Disposition . Caller has NON-URGENT medication question about med that PCP prescribed and triager unable to answer question  Answer Assessment - Initial Assessment Questions 1. SYMPTOMS: "Do you have any symptoms?"     Anxious and not able to stop brain at night  Protocols used: MEDICATION QUESTION CALL-A-AH

## 2018-01-13 NOTE — Telephone Encounter (Signed)
Please advise 

## 2018-01-13 NOTE — Telephone Encounter (Signed)
See note

## 2018-01-14 ENCOUNTER — Other Ambulatory Visit: Payer: Self-pay

## 2018-01-14 MED ORDER — ALPRAZOLAM 0.25 MG PO TABS: 0.1250 mg | ORAL_TABLET | Freq: Every evening | ORAL | 0 refills | Status: DC | PRN

## 2018-01-14 NOTE — Telephone Encounter (Signed)
Ok to send in.  menopause

## 2018-01-14 NOTE — Telephone Encounter (Signed)
Rx written, stamped, faxed to patient's pharmacy.

## 2018-02-03 ENCOUNTER — Ambulatory Visit: Payer: Medicare Other | Admitting: Family Medicine

## 2018-02-11 ENCOUNTER — Ambulatory Visit: Payer: Medicare Other | Admitting: Neurology

## 2018-03-04 ENCOUNTER — Ambulatory Visit
Admission: RE | Admit: 2018-03-04 | Discharge: 2018-03-04 | Disposition: A | Discharge status: Home / Self Care | Payer: Medicare Other | Source: Ambulatory Visit | Attending: Family Medicine | Admitting: Family Medicine

## 2018-03-04 DIAGNOSIS — Z1239 Encounter for other screening for malignant neoplasm of breast: Secondary | ICD-10-CM

## 2018-03-05 ENCOUNTER — Encounter: Payer: Self-pay | Admitting: Family Medicine

## 2018-03-05 ENCOUNTER — Ambulatory Visit (INDEPENDENT_AMBULATORY_CARE_PROVIDER_SITE_OTHER): Payer: Medicare Other | Admitting: Family Medicine

## 2018-03-05 VITALS — BP 122/72 | HR 74 | Temp 97.6°F | Ht 62.0 in | Wt 146.8 lb

## 2018-03-05 DIAGNOSIS — Z0001 Encounter for general adult medical examination with abnormal findings: Secondary | ICD-10-CM | POA: Diagnosis not present

## 2018-03-05 DIAGNOSIS — J309 Allergic rhinitis, unspecified: Secondary | ICD-10-CM

## 2018-03-05 DIAGNOSIS — G40009 Localization-related (focal) (partial) idiopathic epilepsy and epileptic syndromes with seizures of localized onset, not intractable, without status epilepticus: Secondary | ICD-10-CM | POA: Diagnosis not present

## 2018-03-05 DIAGNOSIS — G4700 Insomnia, unspecified: Secondary | ICD-10-CM | POA: Diagnosis not present

## 2018-03-05 DIAGNOSIS — Z13220 Encounter for screening for lipoid disorders: Secondary | ICD-10-CM

## 2018-03-05 DIAGNOSIS — Z8619 Personal history of other infectious and parasitic diseases: Secondary | ICD-10-CM

## 2018-03-05 LAB — COMPREHENSIVE METABOLIC PANEL
ALT: 16 U/L (ref 0–35)
AST: 19 U/L (ref 0–37)
Albumin: 4.4 g/dL (ref 3.5–5.2)
Alkaline Phosphatase: 75 U/L (ref 39–117)
BILIRUBIN TOTAL: 0.5 mg/dL (ref 0.2–1.2)
BUN: 14 mg/dL (ref 6–23)
CO2: 31 mEq/L (ref 19–32)
Calcium: 9.4 mg/dL (ref 8.4–10.5)
Chloride: 103 mEq/L (ref 96–112)
Creatinine, Ser: 0.61 mg/dL (ref 0.40–1.20)
GFR: 94.47 mL/min (ref >60.00)
Glucose, Bld: 84 mg/dL (ref 70–99)
Potassium: 4.3 mEq/L (ref 3.5–5.1)
Sodium: 141 mEq/L (ref 135–145)
Total Protein: 7.4 g/dL (ref 6.0–8.3)

## 2018-03-05 LAB — CBC
HCT: 43.4 % (ref 36.0–46.0)
HEMOGLOBIN: 14.8 g/dL (ref 12.0–15.0)
MCHC: 34.1 g/dL (ref 30.0–36.0)
MCV: 92.5 fl (ref 78.0–100.0)
Platelets: 215 10*3/uL (ref 150.0–400.0)
RBC: 4.69 Mil/uL (ref 3.87–5.11)
RDW: 13.3 % (ref 11.5–15.5)
WBC: 5.8 10*3/uL (ref 4.0–10.5)

## 2018-03-05 LAB — LIPID PANEL
Cholesterol: 202 mg/dL — ABNORMAL HIGH (ref 0–200)
HDL: 52 mg/dL (ref >39.00)
LDL Cholesterol: 134 mg/dL — ABNORMAL HIGH (ref 0–99)
NonHDL: 150.42
Total CHOL/HDL Ratio: 4
Triglycerides: 80 mg/dL (ref 0.0–149.0)
VLDL: 16 mg/dL (ref 0.0–40.0)

## 2018-03-05 NOTE — Progress Notes (Signed)
Chief Complaint:  Joan Mclaughlin is a 78 y.o. female who presents today for her annual comprehensive physical exam.    Assessment/Plan:  Localization-related idiopathic epilepsy and epileptic syndromes with seizures of localized onset, not intractable, without status epilepticus ([place]) Stable.  Continue management per neurology.  Insomnia Stable.  Continue Xanax and hydroxyzine as needed.  Check CBC, CMP, and TSH.  History of cold sores Continue Valtrex as needed.  Allergic rhinitis Stable.  Continue over-the-counter medications.  Preventative Healthcare: Check lipid panel.  Up-to-date on other vaccines and screenings.  Patient Counseling(The following topics were reviewed and/or handout was given):  -Nutrition: Stressed importance of moderation in sodium/caffeine intake, saturated fat and cholesterol, caloric balance, sufficient intake of fresh fruits, vegetables, and fiber.  -Stressed the importance of regular exercise.   -Substance Abuse: Discussed cessation/primary prevention of tobacco, alcohol, or other drug use; driving or other dangerous activities under the influence; availability of treatment for abuse.   -Injury prevention: Discussed safety belts, safety helmets, smoke detector, smoking near bedding or upholstery.   -Sexuality: Discussed sexually transmitted diseases, partner selection, use of condoms, avoidance of unintended pregnancy and contraceptive alternatives.   -Dental health: Discussed importance of regular tooth brushing, flossing, and dental visits.  -Health maintenance and immunizations reviewed. Please refer to Health maintenance section.  Return to care in 1 year for next preventative visit.     Subjective:  HPI:  She has no acute complaints today.   Her stable, chronic medical conditions are outlined below:  # Insomnia - On xanax 0.125mg  and hydroxyzine 10mg  at bed time as needed. Tolerating well without side effects.   # Seizure  Disorder - Follows with neurology - On trileptal 300mg  twice daily and tolerating well  # History of Cold Sore - Uses valtrex as needed - No recent outbreaks  # Allergic Rhinitis - Uses OTC meds as needed  Lifestyle Diet: No specific diets or eating plans. Tries to eat a healthy and balanced diet.  Exercise: Walks 3-4 miles 3-4 times per week.   Depression screen [facility] 2/9 12/03/2017  Decreased Interest 0  Down, Depressed, Hopeless 0  PHQ - 2 Score 0  Altered sleeping 0  Tired, decreased energy 0  Change in appetite 0  Feeling bad or failure about yourself  0  Trouble concentrating 0  Moving slowly or fidgety/restless 0  Suicidal thoughts 0  PHQ-9 Score 0  Difficult doing work/chores Not difficult at all   ROS: Per HPI, otherwise a complete review of systems was negative.   PMH:  The following were reviewed and entered/updated in epic: Past Medical History:  Diagnosis Date  . Allergy   . Anxiety   . Cholecystitis   . Difficulty sleeping    takes med to sleep  . Diverticulitis   . Ganglion cyst 12/11/2016  . GERD (gastroesophageal reflux disease)    "RELATED TO GALLBLADDER"  . Heart murmur    "ONLY DETECTED WHEN LYING DOWN"  . History of Lyme disease 09/19/2016  . Hyperlipidemia   . Lyme disease    Pt states she has had Lyme Disease twice.   . Nocturia   . S/P laparoscopic cholecystectomy 12/23/2014  . Seizures [facility])    Patient Active Problem List   Diagnosis Date Noted  . Allergic rhinitis 03/05/2018  . History of cold sores 03/05/2018  . 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 Localization-related idiopathic epilepsy and epileptic syndromes with seizures of localized onset, not intractable, without status epilepticus ([place]) 03/02/2015  .  Insomnia 05/02/2013   Past Surgical History:  Procedure Laterality Date  . BACK SURGERY  07/2007  . CHOLECYSTECTOMY N/A 12/23/2014   Procedure: LAPAROSCOPIC CHOLECYSTECTOMY WITH INTRAOPERATIVE CHOLANGIOGRAM;   Surgeon: Greer Pickerel, MD;  Location: [facility];  Service: General;  Laterality: N/A;  . MOUTH SURGERY    . TUBAL LIGATION      Family History  Problem Relation Age of Onset  . Arthritis Father   . Alcohol abuse Mother   . Diabetes Mother   . Heart disease Mother   . Hypertension Mother   . Stroke Mother   . Depression Sister   . Hearing loss Sister   . Hyperlipidemia Sister   . Alcohol abuse Daughter   . Alcohol abuse Son   . Depression Sister   . Early death Sister   . Alcohol abuse Brother   . Depression Sister   . Heart disease Sister     Medications- reviewed and updated Current Outpatient Medications  Medication Sig Dispense Refill  . ALPRAZolam (XANAX) 0.25 MG tablet Take 0.5 tablets (0.125 mg total) by mouth at bedtime as needed for anxiety. 30 tablet 0  . Calcium Carbonate-Vit D-Min (CALCIUM 1200) 1200-1000 MG-UNIT CHEW Chew by mouth daily.    . cyanocobalamin 1000 MCG tablet Take 1,000 mcg by mouth daily.    . hydrOXYzine (ATARAX/VISTARIL) 10 MG tablet Take 10 mg by mouth as needed (sleep).    . Multiple Vitamins-Minerals (MULTIVITAMIN WITH MINERALS) tablet Take 1 tablet by mouth daily.    . NON FORMULARY Take 1 tablet by mouth once.    . Omega-3 Fatty Acids (FISH OIL PO) Take by mouth daily.     . Oxcarbazepine (TRILEPTAL) 300 MG tablet Take 1 tablet (300 mg total) by mouth 2 (two) times daily. 180 tablet 3  . Polyethyl Glyc-Propyl Glyc PF (SYSTANE PRESERVATIVE FREE) 0.4-0.3 % SOLN Apply 1 drop to eye 3 (three) times daily as needed. 1 each 0  . Red Yeast Rice Extract (RED YEAST RICE PO) Take by mouth daily.     Marland Kitchen saccharomyces boulardii (FLORASTOR) 250 MG capsule Take 1 capsule (250 mg total) by mouth 2 (two) times daily. (Patient taking differently: Take 250 mg by mouth daily. ) 20 capsule 0  . valACYclovir (VALTREX) 500 MG tablet TAKE 1 TO 2 TABLETS BY MOUTH AS NEEDED FOR COLD SORE OUTBREAK 30 tablet 0   No current facility-administered medications for this visit.      Allergies-reviewed and updated Allergies  Allergen Reactions  . Amoxicillin-Pot Clavulanate Other (See Comments)  . Suvorexant Other (See Comments)    Social History   Socioeconomic History  . Marital status: Single    Spouse name: Not on file  . Number of children: Not on file  . Years of education: 42  . Highest education level: Not on file  Occupational History  . Occupation: retired    Comment: Accounts payable and nanny   Social Needs  . Financial resource strain: Not on file  . Food insecurity:    Worry: Not on file    Inability: Not on file  . Transportation needs:    Medical: Not on file    Non-medical: Not on file  Tobacco Use  . Smoking status: Never Smoker  . Smokeless tobacco: Never Used  Substance and Sexual Activity  . Alcohol use: No    Alcohol/week: 0.0 standard drinks  . Drug use: No  . Sexual activity: Never  Lifestyle  . Physical activity:    Days  per week: Not on file    Minutes per session: Not on file  . Stress: Not on file  Relationships  . Social connections:    Talks on phone: Not on file    Gets together: Not on file    Attends religious service: Not on file    Active member of club or organization: Not on file    Attends meetings of clubs or organizations: Not on file    Relationship status: Not on file  Other Topics Concern  . Not on file  Social History Narrative   Patient lives alone.   patient has 4 grandchildren.   Patient is retired   Patient has 2 years of college.   Patient is right handed.              Objective:  Physical Exam: BP 122/72 (BP Location: Left Arm, Patient Position: Sitting, Cuff Size: Normal)   Pulse 74   Temp 97.6 F (36.4 C) (Oral)   Ht 5\' 2"  (1.575 m)   Wt 146 lb 12.8 oz (66.6 kg)   SpO2 97%   BMI 26.85 kg/m   Body mass index is 26.85 kg/m. Wt Readings from Last 3 Encounters:  03/05/18 146 lb 12.8 oz (66.6 kg)  01/10/18 147 lb (66.7 kg)  12/03/17 147 lb 12.8 oz (67 kg)  Gen: NAD,  resting comfortably HEENT: TMs normal bilaterally. OP clear. No thyromegaly noted.  CV: RRR with no murmurs appreciated Pulm: NWOB, CTAB with no crackles, wheezes, or rhonchi GI: Normal bowel sounds present. Soft, Nontender, Nondistended. MSK: no edema, cyanosis, or clubbing noted Skin: warm, dry Neuro: CN2-12 grossly intact. Strength 5/5 in upper and lower extremities. Reflexes symmetric and intact bilaterally.  Psych: Normal affect and thought content     Caleb M. [name], MD 03/05/2018 10:14 AM

## 2018-03-05 NOTE — Assessment & Plan Note (Signed)
Stable.  Continue Xanax and hydroxyzine as needed.  Check CBC, CMP, and TSH.

## 2018-03-05 NOTE — Assessment & Plan Note (Signed)
Continue Valtrex as needed

## 2018-03-05 NOTE — Assessment & Plan Note (Signed)
Stable.  Continue over-the-counter medications.

## 2018-03-05 NOTE — Assessment & Plan Note (Signed)
Stable.  Continue management per neurology. 

## 2018-03-05 NOTE — Patient Instructions (Signed)
It was very nice to see you today!  Keep up the good work!  We will check blood work today.  Come back in 6 months, or sooner as needed.   Eat at least 3 REAL meals and 1-2 snacks per day.  Aim for no more than 5 hours between eating.  Eat breakfast within one hour of getting up.    Obtain twice as many fruits/vegetables as protein or carbohydrate foods for both lunch and dinner.   Cut down on sweet beverages. This includes juice, soda, and sweet tea.    Exercise at least 150 minutes every week.    Take care, Dr [name]   Preventive Care 14 Years and Older, Female Preventive care refers to lifestyle choices and visits with your health care provider that can promote health and wellness. What does preventive care include?  A yearly physical exam. This is also called an annual well check.  Dental exams once or twice a year.  Routine eye exams. Ask your health care provider how often you should have your eyes checked.  Personal lifestyle choices, including: ? Daily care of your teeth and gums. ? Regular physical activity. ? Eating a healthy diet. ? Avoiding tobacco and drug use. ? Limiting alcohol use. ? Practicing safe sex. ? Taking low-dose aspirin every day. ? Taking vitamin and mineral supplements as recommended by your health care provider. What happens during an annual well check? The services and screenings done by your health care provider during your annual well check will depend on your age, overall health, lifestyle risk factors, and family history of disease. Counseling Your health care provider may ask you questions about your:  Alcohol use.  Tobacco use.  Drug use.  Emotional well-being.  Home and relationship well-being.  Sexual activity.  Eating habits.  History of falls.  Memory and ability to understand (cognition).  Work and work Statistician.  Reproductive health.  Screening You may have the following tests or  measurements:  Height, weight, and BMI.  Blood pressure.  Lipid and cholesterol levels. These may be checked every 5 years, or more frequently if you are over 41 years old.  Skin check.  Lung cancer screening. You may have this screening every year starting at age 39 if you have a 30-pack-year history of smoking and currently smoke or have quit within the past 15 years.  Colorectal cancer screening. All adults should have this screening starting at age 87 and continuing until age 10. You will have tests every 1-10 years, depending on your results and the type of screening test. People at increased risk should start screening at an earlier age. Screening tests may include: ? Guaiac-based fecal occult blood testing. ? Fecal immunochemical test (FIT). ? Stool DNA test. ? Virtual colonoscopy. ? Sigmoidoscopy. During this test, a flexible tube with a tiny camera (sigmoidoscope) is used to examine your rectum and lower colon. The sigmoidoscope is inserted through your anus into your rectum and lower colon. ? Colonoscopy. During this test, a long, thin, flexible tube with a tiny camera (colonoscope) is used to examine your entire colon and rectum.  Hepatitis C blood test.  Hepatitis B blood test.  Sexually transmitted disease (STD) testing.  Diabetes screening. This is done by checking your blood sugar (glucose) after you have not eaten for a while (fasting). You may have this done every 1-3 years.  Bone density scan. This is done to screen for osteoporosis. You may have this done starting at age 34.  Mammogram. This may be done every 1-2 years. Talk to your health care provider about how often you should have regular mammograms. Talk with your health care provider about your test results, treatment options, and if necessary, the need for more tests. Vaccines Your health care provider may recommend certain vaccines, such as:  Influenza vaccine. This is recommended every year.  Tetanus,  diphtheria, and acellular pertussis (Tdap, Td) vaccine. You may need a Td booster every 10 years.  Varicella vaccine. You may need this if you have not been vaccinated.  Zoster vaccine. You may need this after age 18.  Measles, mumps, and rubella (MMR) vaccine. You may need at least one dose of MMR if you were born in 1957 or later. You may also need a second dose.  Pneumococcal 13-valent conjugate (PCV13) vaccine. One dose is recommended after age 41.  Pneumococcal polysaccharide (PPSV23) vaccine. One dose is recommended after age 22.  Meningococcal vaccine. You may need this if you have certain conditions.  Hepatitis A vaccine. You may need this if you have certain conditions or if you travel or work in places where you may be exposed to hepatitis A.  Hepatitis B vaccine. You may need this if you have certain conditions or if you travel or work in places where you may be exposed to hepatitis B.  Haemophilus influenzae type b (Hib) vaccine. You may need this if you have certain conditions. Talk to your health care provider about which screenings and vaccines you need and how often you need them. This information is not intended to replace advice given to you by your health care provider. Make sure you discuss any questions you have with your health care provider. Document Released: 01/21/2015 Document Revised: 02/14/2017 Document Reviewed: 10/26/2014 Elsevier Interactive Patient Education  2019 [employer].

## 2018-03-06 ENCOUNTER — Encounter: Payer: Self-pay | Admitting: Family Medicine

## 2018-03-06 DIAGNOSIS — E785 Hyperlipidemia, unspecified: Secondary | ICD-10-CM | POA: Insufficient documentation

## 2018-03-06 LAB — TSH: TSH: 3.41 u[IU]/mL (ref 0.35–4.50)

## 2018-03-06 NOTE — Progress Notes (Signed)
Please inform patient of the following:  Her cholesterol was a little high but the rest of her blood work was all normal.  She may benefit from starting cholesterol medication to help improve her numbers and lower risk of heart attack and stroke.  Please send in Lipitor 40 mg daily if she is willing to start.  Regardless, she should continue working on diet and exercise and we can recheck in 1 year.  Joan Mclaughlin. [name], MD 03/06/2018 12:09 PM

## 2018-03-25 ENCOUNTER — Other Ambulatory Visit: Payer: Self-pay | Admitting: Neurology

## 2018-03-25 DIAGNOSIS — G40009 Localization-related (focal) (partial) idiopathic epilepsy and epileptic syndromes with seizures of localized onset, not intractable, without status epilepticus: Secondary | ICD-10-CM

## 2018-04-05 ENCOUNTER — Other Ambulatory Visit: Payer: Self-pay | Admitting: Family Medicine

## 2018-04-07 NOTE — Telephone Encounter (Signed)
Please advise 

## 2018-06-21 ENCOUNTER — Other Ambulatory Visit: Payer: Self-pay | Admitting: Family Medicine

## 2018-06-23 NOTE — Telephone Encounter (Signed)
Last OV 03/05/18 f/u 6 mos Last refill 07/15/17 #30/0 Next OV not scheduled

## 2018-06-24 LAB — HM COLONOSCOPY

## 2018-06-27 DIAGNOSIS — D126 Benign neoplasm of colon, unspecified: Secondary | ICD-10-CM | POA: Insufficient documentation

## 2018-06-27 HISTORY — DX: Benign neoplasm of colon, unspecified: D12.6

## 2018-07-29 ENCOUNTER — Encounter: Payer: Self-pay | Admitting: Physician Assistant

## 2018-07-29 ENCOUNTER — Other Ambulatory Visit: Payer: Self-pay

## 2018-07-29 ENCOUNTER — Ambulatory Visit (INDEPENDENT_AMBULATORY_CARE_PROVIDER_SITE_OTHER): Payer: Medicare Other | Admitting: Physician Assistant

## 2018-07-29 VITALS — BP 140/96 | HR 74 | Temp 97.9°F | Ht 62.0 in | Wt 144.4 lb

## 2018-07-29 DIAGNOSIS — H539 Unspecified visual disturbance: Secondary | ICD-10-CM

## 2018-07-29 DIAGNOSIS — R030 Elevated blood-pressure reading, without diagnosis of hypertension: Secondary | ICD-10-CM

## 2018-07-29 DIAGNOSIS — H5711 Ocular pain, right eye: Secondary | ICD-10-CM | POA: Diagnosis not present

## 2018-07-29 LAB — TSH: TSH: 1.78 u[IU]/mL (ref 0.35–4.50)

## 2018-07-29 LAB — CBC WITH DIFFERENTIAL/PLATELET
Basophils Absolute: 0 10*3/uL (ref 0.0–0.1)
Basophils Relative: 0.7 % (ref 0.0–3.0)
Eosinophils Absolute: 0 10*3/uL (ref 0.0–0.7)
Eosinophils Relative: 0.1 % (ref 0.0–5.0)
HCT: 42.6 % (ref 36.0–46.0)
Hemoglobin: 14.4 g/dL (ref 12.0–15.0)
Lymphocytes Relative: 31.9 % (ref 12.0–46.0)
Lymphs Abs: 2.3 10*3/uL (ref 0.7–4.0)
MCHC: 33.7 g/dL (ref 30.0–36.0)
MCV: 93.6 fl (ref 78.0–100.0)
Monocytes Absolute: 0.6 10*3/uL (ref 0.1–1.0)
Monocytes Relative: 7.8 % (ref 3.0–12.0)
Neutro Abs: 4.3 10*3/uL (ref 1.4–7.7)
Neutrophils Relative %: 59.5 % (ref 43.0–77.0)
Platelets: 228 10*3/uL (ref 150.0–400.0)
RBC: 4.55 Mil/uL (ref 3.87–5.11)
RDW: 13.5 % (ref 11.5–15.5)
WBC: 7.2 10*3/uL (ref 4.0–10.5)

## 2018-07-29 LAB — COMPREHENSIVE METABOLIC PANEL
ALT: 18 U/L (ref 0–35)
AST: 19 U/L (ref 0–37)
Albumin: 4.4 g/dL (ref 3.5–5.2)
Alkaline Phosphatase: 74 U/L (ref 39–117)
BUN: 14 mg/dL (ref 6–23)
CO2: 30 mEq/L (ref 19–32)
Calcium: 9.2 mg/dL (ref 8.4–10.5)
Chloride: 100 mEq/L (ref 96–112)
Creatinine, Ser: 0.64 mg/dL (ref 0.40–1.20)
GFR: 89.28 mL/min (ref >60.00)
Glucose, Bld: 86 mg/dL (ref 70–99)
Potassium: 4.5 mEq/L (ref 3.5–5.1)
Sodium: 138 mEq/L (ref 135–145)
Total Bilirubin: 0.5 mg/dL (ref 0.2–1.2)
Total Protein: 7.1 g/dL (ref 6.0–8.3)

## 2018-07-29 NOTE — Progress Notes (Signed)
Joan Mclaughlin is a 78 y.o. female here for a new problem.  I acted as a Education administrator for [employer], PA-C Anselmo Pickler, LPN   History of Present Illness:   Chief Complaint  Patient presents with  . Eye Pain    HPI   Eye pain Pt woke up last week Monday or Tuesday, felt off balance and right eye blurry after 15-20 minutes felt better except right eye still blurry, painful, feels swollen, itching, eye crusted in the morning. Has been using ice on eye, also erythromycin eye ointment no relief. Pt also c/o headache everyday since last week on left side of head, dull most of the time but occasionally sharp jabs. Denies numbness or tingling, confusion, slurred speech.  BP Readings from Last 3 Encounters:  07/29/18 (!) 140/96  03/05/18 122/72  01/10/18 126/74   She went to her eye doctor last week on 07/23/18 for hordeolum on L eye. She is unsure if she was having symptoms at that time, but slit lamp exam of R eye was normal at that time.  She does report that she has been cooking with "a lot of salt" over the weekend.  Past Medical History:  Diagnosis Date  . Allergy   . Anxiety   . Cholecystitis   . Difficulty sleeping    takes med to sleep  . Diverticulitis   . Ganglion cyst 12/11/2016  . GERD (gastroesophageal reflux disease)    "RELATED TO GALLBLADDER"  . Heart murmur    "ONLY DETECTED WHEN LYING DOWN"  . History of Lyme disease 09/19/2016  . Hyperlipidemia   . Lyme disease    Pt states she has had Lyme Disease twice.   . Nocturia   . S/P laparoscopic cholecystectomy 12/23/2014  . Seizures ([place])      Social History   Socioeconomic History  . Marital status: Single    Spouse name: Not on file  . Number of children: Not on file  . Years of education: 38  . Highest education level: Not on file  Occupational History  . Occupation: retired    Comment: Accounts payable and nanny   Social Needs  . Financial resource strain: Not on file  . Food insecurity     Worry: Not on file    Inability: Not on file  . Transportation needs    Medical: Not on file    Non-medical: Not on file  Tobacco Use  . Smoking status: Never Smoker  . Smokeless tobacco: Never Used  Substance and Sexual Activity  . Alcohol use: No    Alcohol/week: 0.0 standard drinks  . Drug use: No  . Sexual activity: Never  Lifestyle  . Physical activity    Days per week: Not on file    Minutes per session: Not on file  . Stress: Not on file  Relationships  . Social Herbalist on phone: Not on file    Gets together: Not on file    Attends religious service: Not on file    Active member of club or organization: Not on file    Attends meetings of clubs or organizations: Not on file    Relationship status: Not on file  . Intimate partner violence    Fear of current or ex partner: Not on file    Emotionally abused: Not on file    Physically abused: Not on file    Forced sexual activity: Not on file  Other Topics Concern  . Not  on file  Social History Narrative   Patient lives alone.   patient has 4 grandchildren.   Patient is retired   Patient has 2 years of college.   Patient is right handed.          Past Surgical History:  Procedure Laterality Date  . BACK SURGERY  07/2007  . CHOLECYSTECTOMY N/A 12/23/2014   Procedure: LAPAROSCOPIC CHOLECYSTECTOMY WITH INTRAOPERATIVE CHOLANGIOGRAM;  Surgeon: Greer Pickerel, MD;  Location: [facility];  Service: General;  Laterality: N/A;  . MOUTH SURGERY    . TUBAL LIGATION      Family History  Problem Relation Age of Onset  . Arthritis Father   . Alcohol abuse Mother   . Diabetes Mother   . Heart disease Mother   . Hypertension Mother   . Stroke Mother   . Depression Sister   . Hearing loss Sister   . Hyperlipidemia Sister   . Alcohol abuse Daughter   . Alcohol abuse Son   . Depression Sister   . Early death Sister   . Alcohol abuse Brother   . Depression Sister   . Heart disease Sister     Allergies   Allergen Reactions  . Amoxicillin-Pot Clavulanate Other (See Comments)  . Suvorexant Other (See Comments)    Current Medications:   Current Outpatient Medications:  .  ALPRAZolam (XANAX) 0.25 MG tablet, TAKE 1/2 TABLET BY MOUTH EVERY NIGHT AT BEDTIME AS NEEDED FOR ANXIETY., Disp: 30 tablet, Rfl: 5 .  Calcium Carbonate-Vit D-Min (CALCIUM 1200) 1200-1000 MG-UNIT CHEW, Chew by mouth daily., Disp: , Rfl:  .  cyanocobalamin 1000 MCG tablet, Take 1,000 mcg by mouth daily., Disp: , Rfl:  .  hydrOXYzine (ATARAX/VISTARIL) 10 MG tablet, Take 10 mg by mouth as needed (sleep)., Disp: , Rfl:  .  MAGNESIUM CITRATE PO, Take 1 tablet by mouth daily., Disp: , Rfl:  .  Multiple Vitamins-Minerals (MULTIVITAMIN WITH MINERALS) tablet, Take 1 tablet by mouth daily., Disp: , Rfl:  .  NON FORMULARY, Take 1 tablet by mouth once., Disp: , Rfl:  .  Omega-3 Fatty Acids (FISH OIL PO), Take by mouth daily. , Disp: , Rfl:  .  Oxcarbazepine (TRILEPTAL) 300 MG tablet, TAKE 1 TABLET(300 MG) BY MOUTH TWICE DAILY, Disp: 180 tablet, Rfl: 3 .  Polyethyl Glyc-Propyl Glyc PF (SYSTANE PRESERVATIVE FREE) 0.4-0.3 % SOLN, Apply 1 drop to eye 3 (three) times daily as needed., Disp: 1 each, Rfl: 0 .  POTASSIUM CHLORIDE PO, Take 1 tablet by mouth daily., Disp: , Rfl:  .  Red Yeast Rice Extract (RED YEAST RICE PO), Take by mouth daily. , Disp: , Rfl:  .  saccharomyces boulardii (FLORASTOR) 250 MG capsule, Take 1 capsule (250 mg total) by mouth 2 (two) times daily. (Patient taking differently: Take 250 mg by mouth daily. ), Disp: 20 capsule, Rfl: 0 .  Turmeric (QC TUMERIC COMPLEX) 500 MG CAPS, Take 1 capsule by mouth daily., Disp: , Rfl:  .  valACYclovir (VALTREX) 500 MG tablet, TAKE 1 TO 2 TABLETS BY MOUTH AS NEEDED FOR COLD SORE OUTBREAK, Disp: 30 tablet, Rfl: 0   Review of Systems:   Review of Systems  Constitutional: Negative for chills, fever, malaise/fatigue and weight loss.  Eyes: Positive for blurred vision. Negative for  double vision.  Respiratory: Negative for shortness of breath.   Cardiovascular: Negative for chest pain, orthopnea, claudication and leg swelling.  Gastrointestinal: Negative for heartburn, nausea and vomiting.  Neurological: Positive for dizziness. Negative for tingling and headaches.  Vitals:   Vitals:   07/29/18 1044 07/29/18 1116  BP: (!) 150/90 (!) 140/96  Pulse: 74   Temp: 97.9 F (36.6 C)   TempSrc: Oral   SpO2: 95%   Weight: 144 lb 6.1 oz (65.5 kg)   Height: 5\' 2"  (1.575 m)      Body mass index is 26.41 kg/m.  Physical Exam:   Physical Exam Vitals signs and nursing note reviewed.  Constitutional:      General: She is not in acute distress.    Appearance: She is well-developed. She is not ill-appearing or toxic-appearing.  Eyes:     General:        Right eye: No foreign body.        Left eye: No foreign body.     Comments: L upper outer lid with slight erythema; no conjunctiva irritation b/l  Cardiovascular:     Rate and Rhythm: Normal rate and regular rhythm.     Pulses: Normal pulses.     Heart sounds: Normal heart sounds, S1 normal and S2 normal.     Comments: No LE edema Pulmonary:     Effort: Pulmonary effort is normal.     Breath sounds: Normal breath sounds.  Skin:    General: Skin is warm and dry.  Neurological:     General: No focal deficit present.     Mental Status: She is alert.     GCS: GCS eye subscore is 4. GCS verbal subscore is 5. GCS motor subscore is 6.     Cranial Nerves: Cranial nerves are intact.     Sensory: Sensation is intact.     Motor: Motor function is intact.     Coordination: Coordination is intact. Romberg sign negative.     Gait: Gait is intact.  Psychiatric:        Speech: Speech normal.        Behavior: Behavior normal. Behavior is cooperative.     Assessment and Plan:   Mulan was seen today for eye pain.  Diagnoses and all orders for this visit:  Elevated blood pressure reading She does endorse significant  increase in salt intake. Discussed need for reduction in salt. Recheck blood pressure in two weeks in office. If any worsening symptoms, was advised to go to the ER. -     CBC with Differential/Platelet -     Comprehensive metabolic panel -     TSH  Pain of right eye/Vision changes Neuro exam was benign, and vision changes resolved. I do recommend that she follow up with eye doctor (we were able to secure appointment for Friday) and with the neurologist (we will reach out to schedule with them.) Reiterated with patient -- low threshold to go to the ER.   . Reviewed expectations re: course of current medical issues. . Discussed self-management of symptoms. . Outlined signs and symptoms indicating need for more acute intervention. . Patient verbalized understanding and all questions were answered. . See orders for this visit as documented in the electronic medical record. . Patient received an After-Visit Summary.  CMA or LPN served as scribe during this visit. History, Physical, and Plan performed by medical provider. The above documentation has been reviewed and is accurate and complete.  Inda Coke, PA-C

## 2018-07-29 NOTE — Patient Instructions (Signed)
It was great to see you!  Appointment with the eye doctor is Friday at 9:20am.  I will try to get in touch with the neurologists office to make you an appointment.  If you develop any changes in symptoms, please go to the ER.  Take care,  Inda Coke PA-C

## 2018-07-30 ENCOUNTER — Encounter: Payer: Self-pay | Admitting: Family Medicine

## 2018-07-30 DIAGNOSIS — D126 Benign neoplasm of colon, unspecified: Secondary | ICD-10-CM

## 2018-08-12 ENCOUNTER — Encounter: Payer: Self-pay | Admitting: Physician Assistant

## 2018-08-12 ENCOUNTER — Ambulatory Visit (INDEPENDENT_AMBULATORY_CARE_PROVIDER_SITE_OTHER): Payer: Medicare Other | Admitting: Physician Assistant

## 2018-08-12 ENCOUNTER — Other Ambulatory Visit: Payer: Self-pay

## 2018-08-12 VITALS — BP 140/80 | HR 73 | Temp 97.4°F | Ht 62.0 in | Wt 145.0 lb

## 2018-08-12 DIAGNOSIS — R030 Elevated blood-pressure reading, without diagnosis of hypertension: Secondary | ICD-10-CM | POA: Diagnosis not present

## 2018-08-12 NOTE — Progress Notes (Signed)
Joan Mclaughlin is a 78 y.o. female is here to follow up on blood pressure.  I acted as a Education administrator for [employer], PA-C Anselmo Pickler, LPN  History of Present Illness:   Chief Complaint  Patient presents with  . F/U elevated blood pressure    HPI   Elevated blood pressure Pt following up today on blood pressure. She does not check her blood pressure at home. Pt denies headaches, dizziness, blurred vision, chest pain, SOB or lower leg edema. Denies excessive caffeine intake, stimulant usage, excessive alcohol intake.  She has been using a lot of salt in her foods and she does report that she has significant stress with her two children right now. Sees a therapist monthly.   Health Maintenance Due  Topic Date Due  . INFLUENZA VACCINE  08/09/2018    Past Medical History:  Diagnosis Date  . Allergy   . Anxiety   . Cholecystitis   . Difficulty sleeping    takes med to sleep  . Diverticulitis   . Ganglion cyst 12/11/2016  . GERD (gastroesophageal reflux disease)    "RELATED TO GALLBLADDER"  . Heart murmur    "ONLY DETECTED WHEN LYING DOWN"  . History of Lyme disease 09/19/2016  . Hyperlipidemia   . Lyme disease    Pt states she has had Lyme Disease twice.   . Nocturia   . S/P laparoscopic cholecystectomy 12/23/2014  . Seizures ([place])   . Tubular adenoma of colon 06/27/2018     Social History   Socioeconomic History  . Marital status: Single    Spouse name: Not on file  . Number of children: Not on file  . Years of education: 67  . Highest education level: Not on file  Occupational History  . Occupation: retired    Comment: Accounts payable and nanny   Social Needs  . Financial resource strain: Not on file  . Food insecurity    Worry: Not on file    Inability: Not on file  . Transportation needs    Medical: Not on file    Non-medical: Not on file  Tobacco Use  . Smoking status: Never Smoker  . Smokeless tobacco: Never Used  Substance and Sexual  Activity  . Alcohol use: No    Alcohol/week: 0.0 standard drinks  . Drug use: No  . Sexual activity: Never  Lifestyle  . Physical activity    Days per week: Not on file    Minutes per session: Not on file  . Stress: Not on file  Relationships  . Social Herbalist on phone: Not on file    Gets together: Not on file    Attends religious service: Not on file    Active member of club or organization: Not on file    Attends meetings of clubs or organizations: Not on file    Relationship status: Not on file  . Intimate partner violence    Fear of current or ex partner: Not on file    Emotionally abused: Not on file    Physically abused: Not on file    Forced sexual activity: Not on file  Other Topics Concern  . Not on file  Social History Narrative   Patient lives alone.   patient has 4 grandchildren.   Patient is retired   Patient has 2 years of college.   Patient is right handed.          Past Surgical History:  Procedure  Laterality Date  . BACK SURGERY  07/2007  . CHOLECYSTECTOMY N/A 12/23/2014   Procedure: LAPAROSCOPIC CHOLECYSTECTOMY WITH INTRAOPERATIVE CHOLANGIOGRAM;  Surgeon: Greer Pickerel, MD;  Location: [facility];  Service: General;  Laterality: N/A;  . MOUTH SURGERY    . TUBAL LIGATION      Family History  Problem Relation Age of Onset  . Arthritis Father   . Alcohol abuse Mother   . Diabetes Mother   . Heart disease Mother   . Hypertension Mother   . Stroke Mother   . Depression Sister   . Hearing loss Sister   . Hyperlipidemia Sister   . Alcohol abuse Daughter   . Alcohol abuse Son   . Depression Sister   . Early death Sister   . Alcohol abuse Brother   . Depression Sister   . Heart disease Sister     PMHx, SurgHx, SocialHx, FamHx, Medications, and Allergies were reviewed in the Visit Navigator and updated as appropriate.   Patient Active Problem List   Diagnosis Date Noted  . Tubular adenoma of colon 06/27/2018  . Dyslipidemia  03/06/2018  . Allergic rhinitis 03/05/2018  . History of cold sores 03/05/2018  . 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 Localization-related idiopathic epilepsy and epileptic syndromes with seizures of localized onset, not intractable, without status epilepticus ([place]) 03/02/2015  . Insomnia 05/02/2013    Social History   Tobacco Use  . Smoking status: Never Smoker  . Smokeless tobacco: Never Used  Substance Use Topics  . Alcohol use: No    Alcohol/week: 0.0 standard drinks  . Drug use: No    Current Medications and Allergies:    Current Outpatient Medications:  .  ALPRAZolam (XANAX) 0.25 MG tablet, TAKE 1/2 TABLET BY MOUTH EVERY NIGHT AT BEDTIME AS NEEDED FOR ANXIETY., Disp: 30 tablet, Rfl: 5 .  Calcium Carbonate-Vit D-Min (CALCIUM 1200) 1200-1000 MG-UNIT CHEW, Chew by mouth daily., Disp: , Rfl:  .  hydrOXYzine (ATARAX/VISTARIL) 10 MG tablet, Take 10 mg by mouth as needed (sleep)., Disp: , Rfl:  .  MAGNESIUM CITRATE PO, Take 1 tablet by mouth daily., Disp: , Rfl:  .  Multiple Vitamins-Minerals (MULTIVITAMIN WITH MINERALS) tablet, Take 1 tablet by mouth daily., Disp: , Rfl:  .  Omega-3 Fatty Acids (FISH OIL PO), Take by mouth daily. , Disp: , Rfl:  .  Oxcarbazepine (TRILEPTAL) 300 MG tablet, TAKE 1 TABLET(300 MG) BY MOUTH TWICE DAILY, Disp: 180 tablet, Rfl: 3 .  POTASSIUM CHLORIDE PO, Take 1 tablet by mouth daily., Disp: , Rfl:  .  Red Yeast Rice Extract (RED YEAST RICE PO), Take by mouth daily. , Disp: , Rfl:  .  saccharomyces boulardii (FLORASTOR) 250 MG capsule, Take 1 capsule (250 mg total) by mouth 2 (two) times daily. (Patient taking differently: Take 250 mg by mouth daily. ), Disp: 20 capsule, Rfl: 0 .  Turmeric (QC TUMERIC COMPLEX) 500 MG CAPS, Take 1 capsule by mouth daily., Disp: , Rfl:  .  valACYclovir (VALTREX) 500 MG tablet, TAKE 1 TO 2 TABLETS BY MOUTH AS NEEDED FOR COLD SORE OUTBREAK, Disp: 30 tablet, Rfl: 0   Allergies  Allergen Reactions  .  Amoxicillin-Pot Clavulanate Other (See Comments)  . Suvorexant Other (See Comments)    Review of Systems   ROS Negative unless otherwise specified per HPI.  Vitals:   Vitals:   08/12/18 1409  BP: 140/80  Pulse: 73  Temp: (!) 97.4 F (36.3 C)  TempSrc: Temporal  SpO2: 97%  Weight:  145 lb (65.8 kg)  Height: 5\' 2"  (1.575 m)     Body mass index is 26.52 kg/m.   Physical Exam:    Physical Exam Vitals signs and nursing note reviewed.  Constitutional:      General: She is not in acute distress.    Appearance: She is well-developed. She is not ill-appearing or toxic-appearing.  Cardiovascular:     Rate and Rhythm: Normal rate and regular rhythm.     Pulses: Normal pulses.     Heart sounds: Normal heart sounds, S1 normal and S2 normal.     Comments: No LE edema Pulmonary:     Effort: Pulmonary effort is normal.     Breath sounds: Normal breath sounds.  Skin:    General: Skin is warm and dry.  Neurological:     Mental Status: She is alert.     GCS: GCS eye subscore is 4. GCS verbal subscore is 5. GCS motor subscore is 6.  Psychiatric:        Speech: Speech normal.        Behavior: Behavior normal. Behavior is cooperative.      Assessment and Plan:    Ledora was seen today for f/u elevated blood pressure.  Diagnoses and all orders for this visit:  Elevated blood pressure reading   Blood pressure is more normotensive today. She is asymptomatic. Discussed need for salt reduction (uses a lot of salt in cooking) as well as stress management.  . Reviewed expectations re: course of current medical issues. . Discussed self-management of symptoms. . Outlined signs and symptoms indicating need for more acute intervention. . Patient verbalized understanding and all questions were answered. . See orders for this visit as documented in the electronic medical record. . Patient received an After Visit Summary.  CMA or LPN served as scribe during this visit. History,  Physical, and Plan performed by medical provider. The above documentation has been reviewed and is accurate and complete.   Inda Coke, PA-C [place] [place] 08/12/2018  Follow-up: No follow-ups on file.

## 2018-08-12 NOTE — Patient Instructions (Signed)
It was great to see you!  Work on salt reduction and stress management as able.  Take care,  Inda Coke PA-C

## 2018-08-15 ENCOUNTER — Other Ambulatory Visit: Payer: Self-pay

## 2018-08-15 DIAGNOSIS — Z20822 Contact with and (suspected) exposure to covid-19: Secondary | ICD-10-CM

## 2018-08-16 LAB — NOVEL CORONAVIRUS, NAA: SARS-CoV-2, NAA: NOT DETECTED

## 2018-08-25 ENCOUNTER — Telehealth: Payer: Self-pay

## 2018-08-25 NOTE — Telephone Encounter (Signed)
Joan Mclaughlin, please be on the look out for this referral from Dr. Acie Fredrickson [facility].Please schedule a clinic visit to discuss advanced polyp resection techniques.Thank you.GM  09/16/18 at 1:50 pm appt made letter mailed to the pt with appt information.

## 2018-08-25 NOTE — Telephone Encounter (Signed)
-----   Message from Irving Copas., MD sent at 08/24/2018  4:46 AM EDT ----- Regarding: RE: Flat Tubular adenoma, needs endoscopic mucosal resection Megan Salon, Started this is a duplicate message.Would be happy to review and discuss with the patient other techniques that we may have available for resection of this polyp.Just for clarification from my standpoint, dated you actually begin the piecemeal resection and if you did how much percentage do you think you did a hot versus cold?Since this is recently been worked on I likely would give it a few weeks time before we would attempt repeat resection to allow things to heal.Please feel free to have your staff send copies of the procedure and pathology report.If you have any opportunity to just send me the color photos via email of your provation note that would also be helpful.Twylah Bennetts, please be on the look out for this referral from Dr. Acie Fredrickson GI.Please schedule a clinic visit to discuss advanced polyp resection techniques.Thank you.GM ----- Message ----- From: Ronnette Juniper, MD Sent: 08/22/2018  11:32 AM EDT To: Irving Copas., MD Subject: Flat Tubular adenoma, needs endoscopic mucos#  Hi Gabe,  I have a patient with a 2 cm tubular adenoma in distal transverse colon that I was  not able to removed with piecemeal polypectomy.  She had a colonoscopy by Dr.Edwards in 06/2018 and he described it was a thickened fold at 35-40 cm from insertion, took biopsies which showed tubular adenoma and had tattooed the opposite wall.  I performed a flexible sigmoidoscopy on 08/20/2018, her prep was poor( I had only given enemas with the impression that it was a sigmoid polyp), but was in the distal transverse and I was not able to remove it. It is pretty flat, about 2 cm, occupies about half the circumference.  The pathologist did not see any dysplastic or malignant cells and reported it was a tubular adenoma.  Can you evaluate her for endoscopic mucosal  resection? I can have the office send you copies of the procedure and pathology.  Thanks, Megan Salon

## 2018-09-01 ENCOUNTER — Ambulatory Visit (INDEPENDENT_AMBULATORY_CARE_PROVIDER_SITE_OTHER): Payer: Medicare Other | Admitting: Neurology

## 2018-09-01 ENCOUNTER — Other Ambulatory Visit: Payer: Self-pay

## 2018-09-01 ENCOUNTER — Encounter: Payer: Self-pay | Admitting: Neurology

## 2018-09-01 DIAGNOSIS — G40009 Localization-related (focal) (partial) idiopathic epilepsy and epileptic syndromes with seizures of localized onset, not intractable, without status epilepticus: Secondary | ICD-10-CM

## 2018-09-01 MED ORDER — OXCARBAZEPINE 300 MG PO TABS: ORAL_TABLET | ORAL | 3 refills | Status: DC

## 2018-09-01 NOTE — Patient Instructions (Signed)
Great seeing you! Continue Trileptal 300mg  twice a day. Wishing you well with the upcoming surgery. Continue to monitor BP with your PCP. Follow-up in 1 year, call for any changes.  Seizure Precautions: 1. If medication has been prescribed for you to prevent seizures, take it exactly as directed.  Do not stop taking the medicine without talking to your doctor first, even if you have not had a seizure in a long time.   2. Avoid activities in which a seizure would cause danger to yourself or to others.  Don't operate dangerous machinery, swim alone, or climb in high or dangerous places, such as on ladders, roofs, or girders.  Do not drive unless your doctor says you may.  3. If you have any warning that you may have a seizure, lay down in a safe place where you can't hurt yourself.    4.  No driving for 6 months from last seizure, as per [facility]   Please refer to the following link on the [facility] website for more information: http://www.epilepsyfoundation.org/answerplace/Social/driving/drivingu.cfm   5.  Maintain good sleep hygiene. Avoid alcohol.  6.  Contact your doctor if you have any problems that may be related to the medicine you are taking.  7.  Call 911 and bring the patient back to the ED if:        A.  The seizure lasts longer than 5 minutes.       B.  The patient doesn't awaken shortly after the seizure  C.  The patient has new problems such as difficulty seeing, speaking or moving  D.  The patient was injured during the seizure  E.  The patient has a temperature over 102 F (39C)  F.  The patient vomited and now is having trouble breathing

## 2018-09-01 NOTE — Progress Notes (Signed)
NEUROLOGY FOLLOW UP OFFICE NOTE  Joan Mclaughlin [id number]  DOB: [birth year]  HISTORY OF PRESENT ILLNESS: I had the pleasure of seeing Joan Mclaughlin in follow-up in the neurology clinic on 09/01/2018. She was last seen in February 2018. She continues to do well and denies any seizures or seizure-like symptoms since July 2013. She is taking oxcarbazepine 300mg  BID without side effects. She denies any staring/unresponsive episodes, gaps in time, olfactory/gustatory hallucinations, focal numbness/tingling/weakness, myoclonic jerks. No headaches, dizziness, vision changes, no falls. She has been having elevated BP and continues to follow-up with her PCP. She had a colonoscopy showing a polyp and is scheduled for surgery next month. Sleep is on and off, she takes hydroxyzine and 1/2 tab of Xanax every night.   History on Initial Assessment 05/01/2013: This is a 78 yo RH woman with a history of seizures diagnosed in 2008. She was in [place] and was sleep deprived, started feeling strange, "like looking from the outside in," then fell down with a generalized convulsion. She was living in [place] and had seen a neurologist, started on Trileptal, which she had been taking for several years until she self-reduced dose by half. She had been taking this lower dose for 2 years with no seizures until July 2013. At that time, she had been taking lorazepam 1mg  qhs for several years and did not take it the night prior, instead took a different over the counter sleep aid. She woke up with blood on her face and bowel incontinence, then apparently had another seizure that was witnessed by her son with foaming at the mouth. She was started on Keppra but felt weird on this and was put back on Trileptal 300mg  BID since then, with no further seizures.   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 She "always fainted" in church. In college, she  was drinking the night before and recalls looking in the mirror, then found herself on the floor. She denies any gaps in time, no staring/unresponsive episodes, olfactory/gustatory hallucinations, focal numbness/tingling/weakness, myoclonic jerks. She has never been a good sleeper, and has tried all the over the counter medications with no effect. She has tried Ambien (felt hungover), Lunesta and Melatonin gave her weird nightmarish thoughts. She has been taking lorazepam nightly for at least 3 or 4 years, and this is the only medication that helps her without feeling hungover the next day.   Epilepsy Risk Factors: Her brother has seizures. Another brother may have seizures also, he takes phenobarbital. Her nephew may have seizures as well. Otherwise she had a normal birth and early development. There is no history of febrile convulsions, CNS infections such as meningitis/encephalitis, significant traumatic brain injury, neurosurgical procedures.  Diagnostic Data: Routine EEG by Dr. Sonny Masters 07/2005: Left posterior frontal sharp wave activity with phase reversal at F7. Left frontal sharp and slow activity. Abnormal EEG with posterior frontal low activity compatible with seizure disorder with left frontal focus.  Routine EEG by Dr. Sonny Masters 06/2007: Spike activity with phase reversal noted at [facility]. Hyperventilation denoted spike activity. Impression: Left frontal region irritative lesion compatible with underlying seizures.  Routine EEG by Dr. Sonny Masters 05/2009: Left temporal spike activity noted with phase reversal T3. Impression: Abnormal due to irritative lesion left temporal region compatible with underlying seizure disorder.  MRI brain 04/2006: There are scattered punctate foci of signal abnormality in the cerebral white matter bilaterally. These findings are consistent with mild microvascular ischemia.  Per Dr. Prentice Docker note: "Reviewed MRI head, MRI brain from March 28, 2006 demonstrates atrophy in the  medial temporal lobes, more prominent on the right." She had a seizure in July 2013 felt to be provoked by diphenhydramine and low dose Trileptal  PAST MEDICAL HISTORY: Past Medical History:  Diagnosis Date  . Allergy   . Anxiety   . Cholecystitis   . Difficulty sleeping    takes med to sleep  . Diverticulitis   . Ganglion cyst 12/11/2016  . GERD (gastroesophageal reflux disease)    "RELATED TO GALLBLADDER"  . Heart murmur    "ONLY DETECTED WHEN LYING DOWN"  . History of Lyme disease 09/19/2016  . Hyperlipidemia   . Lyme disease    Pt states she has had Lyme Disease twice.   . Nocturia   . S/P laparoscopic cholecystectomy 12/23/2014  . Seizures ([place])   . Tubular adenoma of colon 06/27/2018    MEDICATIONS: Current Outpatient Medications on File Prior to Visit  Medication Sig Dispense Refill  . ALPRAZolam (XANAX) 0.25 MG tablet TAKE 1/2 TABLET BY MOUTH EVERY NIGHT AT BEDTIME AS NEEDED FOR ANXIETY. 30 tablet 5  . Calcium Carbonate-Vit D-Min (CALCIUM 1200) 1200-1000 MG-UNIT CHEW Chew by mouth daily.    . hydrOXYzine (ATARAX/VISTARIL) 10 MG tablet Take 10 mg by mouth as needed (sleep).    Marland Kitchen MAGNESIUM CITRATE PO Take 1 tablet by mouth daily.    . Multiple Vitamins-Minerals (MULTIVITAMIN WITH MINERALS) tablet Take 1 tablet by mouth daily.    . Omega-3 Fatty Acids (FISH OIL PO) Take by mouth daily.     . Oxcarbazepine (TRILEPTAL) 300 MG tablet TAKE 1 TABLET(300 MG) BY MOUTH TWICE DAILY 180 tablet 3  . POTASSIUM CHLORIDE PO Take 1 tablet by mouth daily.    . Red Yeast Rice Extract (RED YEAST RICE PO) Take by mouth daily.     Marland Kitchen saccharomyces boulardii (FLORASTOR) 250 MG capsule Take 1 capsule (250 mg total) by mouth 2 (two) times daily. (Patient taking differently: Take 250 mg by mouth daily. ) 20 capsule 0  . Turmeric (QC TUMERIC COMPLEX) 500 MG CAPS Take 1 capsule by mouth daily.    . valACYclovir (VALTREX) 500 MG tablet TAKE 1 TO 2 TABLETS BY MOUTH AS NEEDED FOR COLD SORE OUTBREAK 30  tablet 0   No current facility-administered medications on file prior to visit.     ALLERGIES: Allergies  Allergen Reactions  . Amoxicillin-Pot Clavulanate Other (See Comments)  . Suvorexant Other (See Comments)    FAMILY HISTORY: Family History  Problem Relation Age of Onset  . Arthritis Father   . Alcohol abuse Mother   . Diabetes Mother   . Heart disease Mother   . Hypertension Mother   . Stroke Mother   . Depression Sister   . Hearing loss Sister   . Hyperlipidemia Sister   . Alcohol abuse Daughter   . Alcohol abuse Son   . Depression Sister   . Early death Sister   . Alcohol abuse Brother   . Depression Sister   . Heart disease Sister     SOCIAL HISTORY: Social History   Socioeconomic History  . Marital status: Single    Spouse name: Not on file  . Number of children: Not on file  . Years of education: 86  . Highest education level: Not on file  Occupational History  . Occupation: retired    Comment: Accounts payable and nanny   Social Needs  . Emergency planning/management officer  strain: Not on file  . Food insecurity    Worry: Not on file    Inability: Not on file  . Transportation needs    Medical: Not on file    Non-medical: Not on file  Tobacco Use  . Smoking status: Never Smoker  . Smokeless tobacco: Never Used  Substance and Sexual Activity  . Alcohol use: No    Alcohol/week: 0.0 standard drinks  . Drug use: No  . Sexual activity: Never  Lifestyle  . Physical activity    Days per week: Not on file    Minutes per session: Not on file  . Stress: Not on file  Relationships  . Social Herbalist on phone: Not on file    Gets together: Not on file    Attends religious service: Not on file    Active member of club or organization: Not on file    Attends meetings of clubs or organizations: Not on file    Relationship status: Not on file  . Intimate partner violence    Fear of current or ex partner: Not on file    Emotionally abused: Not on file     Physically abused: Not on file    Forced sexual activity: Not on file  Other Topics Concern  . Not on file  Social History Narrative   Patient lives alone.   patient has 4 grandchildren.   Patient is retired   Patient has 2 years of college.   Patient is right handed.          REVIEW OF SYSTEMS: Constitutional: No fevers, chills, or sweats, no generalized fatigue, change in appetite Eyes: No visual changes, double vision, eye pain Ear, nose and throat: No hearing loss, ear pain, nasal congestion, sore throat Cardiovascular: No chest pain, palpitations Respiratory:  No shortness of breath at rest or with exertion, wheezes GastrointestinaI: No nausea, vomiting, diarrhea, abdominal pain, fecal incontinence Genitourinary:  No dysuria, urinary retention or frequency Musculoskeletal:  No neck pain, back pain Integumentary: No rash, pruritus, skin lesions Neurological: as above Psychiatric: No depression, +insomnia,no anxiety Endocrine: No palpitations, fatigue, diaphoresis, mood swings, change in appetite, change in weight, increased thirst Hematologic/Lymphatic:  No anemia, purpura, petechiae. Allergic/Immunologic: no itchy/runny eyes, nasal congestion, recent allergic reactions, rashes  PHYSICAL EXAM: Vitals:   09/01/18 0949  BP: (!) 164/73  Pulse: 81  SpO2: 96%   General: No acute distress Head:  Normocephalic/atraumatic Skin/Extremities: No rash, no edema Neurological Exam: alert and oriented to person, place, and time. No aphasia or dysarthria. Fund of knowledge is appropriate.  Recent and remote memory are intact.  Attention and concentration are normal.    Able to name objects and repeat phrases. Cranial nerves: Pupils equal, round, reactive to light.  Extraocular movements intact with no nystagmus. Visual fields full. No facial asymmetry. Tongue, uvula, palate midline.  Motor: Bulk and tone normal, muscle strength 5/5 throughout with no pronator drift. Finger to nose  testing intact.  Gait narrow-based and steady, difficulty with tandem walk.  Romberg negative.  IMPRESSION: This is an 78 yo RH woman with a history of episodes of loss of consciousness as a teenager, and 2 witnessed convulsions in 2008 and 2013. The last seizure occurred in the setting of reduced Trileptal dose and not taking lorazepam the night prior. Prior EEGs have shown left frontotemporal sharp waves, suggestive of focal to bilateral tonic-clonic seizures. She continues to do well seizure-free since 2013, refills sent for oxcarbazepine 300mg  BID.  Continue to monitor BP with PCP. She is aware of [place] driving laws, and she knows to stop driving after a seizure, until 6 months seizure-free. She will follow-up in 1 year and knows to call our office for any problems in the interim.  Thank you for allowing me to participate in her care.  Please do not hesitate to call for any questions or concerns.   Ellouise Newer, M.D.   CC: Dr. [name]

## 2018-09-02 ENCOUNTER — Ambulatory Visit: Payer: Medicare Other | Admitting: Neurology

## 2018-09-02 ENCOUNTER — Ambulatory Visit: Payer: Medicare Other | Admitting: Family Medicine

## 2018-09-16 ENCOUNTER — Other Ambulatory Visit: Payer: Self-pay

## 2018-09-16 ENCOUNTER — Other Ambulatory Visit (INDEPENDENT_AMBULATORY_CARE_PROVIDER_SITE_OTHER): Payer: Medicare Other

## 2018-09-16 ENCOUNTER — Encounter: Payer: Self-pay | Admitting: Gastroenterology

## 2018-09-16 ENCOUNTER — Ambulatory Visit (INDEPENDENT_AMBULATORY_CARE_PROVIDER_SITE_OTHER): Payer: Medicare Other | Admitting: Gastroenterology

## 2018-09-16 VITALS — BP 154/90 | HR 76 | Temp 98.4°F | Ht 61.5 in | Wt 143.5 lb

## 2018-09-16 DIAGNOSIS — Z860100 Personal history of colon polyps, unspecified: Secondary | ICD-10-CM | POA: Insufficient documentation

## 2018-09-16 DIAGNOSIS — R933 Abnormal findings on diagnostic imaging of other parts of digestive tract: Secondary | ICD-10-CM | POA: Diagnosis not present

## 2018-09-16 DIAGNOSIS — D126 Benign neoplasm of colon, unspecified: Secondary | ICD-10-CM

## 2018-09-16 DIAGNOSIS — D369 Benign neoplasm, unspecified site: Secondary | ICD-10-CM | POA: Diagnosis not present

## 2018-09-16 LAB — CBC
HCT: 42.7 % (ref 36.0–46.0)
Hemoglobin: 14.7 g/dL (ref 12.0–15.0)
MCHC: 34.4 g/dL (ref 30.0–36.0)
MCV: 92.1 fl (ref 78.0–100.0)
Platelets: 239 10*3/uL (ref 150.0–400.0)
RBC: 4.63 Mil/uL (ref 3.87–5.11)
RDW: 12.7 % (ref 11.5–15.5)
WBC: 6.9 10*3/uL (ref 4.0–10.5)

## 2018-09-16 LAB — PROTIME-INR
INR: 1.1 ratio — ABNORMAL HIGH (ref 0.8–1.0)
Prothrombin Time: 12.7 s (ref 9.6–13.1)

## 2018-09-16 MED ORDER — PEG 3350-KCL-NA BICARB-NACL 420 G PO SOLR: 4000.0000 mL | Freq: Once | ORAL | 0 refills | Status: AC

## 2018-09-16 NOTE — Progress Notes (Signed)
[facility] VISIT   Primary Care Provider Vivi Barrack, [place] [address] [phone]  Referring Provider Dr. Therisa Doyne & Dr. Oletta Lamas  Patient Profile: Joan Mclaughlin is a 78 y.o. female with a pmh significant for anxiety, allergies, status post cholecystectomy, diverticulosis (with prior diverticulitis), GERD, hyperlipidemia, seizures, colon polyps.  The patient presents to the [facility] for an evaluation and management of problem(s) noted below:  Problem List 1. Tubular adenoma of colon   2. History of colonic polyps   3. Abnormal colonoscopy     History of Present Illness This is the patient's first visit to the outpatient [facility].  She is followed by [facility] gastroenterology but has been referred for consideration of advanced polyp resection.  The patient initially underwent a colonoscopy in June 2020 with findings of a thickened fold at approximately 35 to 40 cm from insertion and biopsies returned showing tubular adenoma with a tattoo placed on the opposite wall.  A flexible sigmoidoscopy was then attempted in August however the preparation was poor as she only received enemas with the impression that this was a sigmoid colon polyp.  However it was felt at the time of this procedure that the polyp was in the distal transverse and preparation was poor.  The polyp was noted to be flat and approximately 2 cm in size occupying at least half the circumference of the fold.  Biopsies were obtained and it showed evidence of tubular adenoma.  The polyp was not resected completely.  It is for this reason that the patient has been referred to consider advanced endoscopic resection techniques.  Patient is otherwise other significant GI issues complaints.  She has not had any issues after her prior procedures  There is no family history of colon cancer.  GI Review of Systems Positive as above Negative for dysphagia,  odynophagia, change in bowel habits, melena, hematochezia  Review of Systems General: Denies fevers/chills/weight loss HEENT: Denies oral lesions Cardiovascular: Denies chest pain Pulmonary: Denies shortness of breath Gastroenterological: See HPI Genitourinary: Denies darkened urine Hematological: Denies easy bruising/bleeding Dermatological: Denies jaundice Psychological: Mood is stable   Medications Current Outpatient Medications  Medication Sig Dispense Refill   ALPRAZolam (XANAX) 0.25 MG tablet TAKE 1/2 TABLET BY MOUTH EVERY NIGHT AT BEDTIME AS NEEDED FOR ANXIETY. 30 tablet 5   B Complex-C (SUPER B COMPLEX PO) Take 1 tablet by mouth daily.     Calcium Carbonate-Vit D-Min (CALCIUM 1200) 1200-1000 MG-UNIT CHEW Chew by mouth daily.     hydrOXYzine (ATARAX/VISTARIL) 10 MG tablet Take 10 mg by mouth as needed (sleep).     MAGNESIUM CITRATE PO Take 1 tablet by mouth daily.     Misc Natural Products (CHOLESTEROL SUPPORT PO) Take 1 tablet by mouth daily.     Multiple Vitamins-Minerals (MULTIVITAMIN WITH MINERALS) tablet Take 1 tablet by mouth daily.     Omega-3 Fatty Acids (FISH OIL PO) Take by mouth daily.      Oxcarbazepine (TRILEPTAL) 300 MG tablet TAKE 1 TABLET(300 MG) BY MOUTH TWICE DAILY 180 tablet 3   POTASSIUM CHLORIDE PO Take 1 tablet by mouth daily.     Red Yeast Rice Extract (RED YEAST RICE PO) Take by mouth daily.      saccharomyces boulardii (FLORASTOR) 250 MG capsule Take 1 capsule (250 mg total) by mouth 2 (two) times daily. (Patient taking differently: Take 250 mg by mouth daily. ) 20 capsule 0   Turmeric (QC TUMERIC COMPLEX)  500 MG CAPS Take 1 capsule by mouth daily.     valACYclovir (VALTREX) 500 MG tablet TAKE 1 TO 2 TABLETS BY MOUTH AS NEEDED FOR COLD SORE OUTBREAK 30 tablet 0   No current facility-administered medications for this visit.     Allergies Allergies  Allergen Reactions   Amoxicillin-Pot Clavulanate Other (See Comments)   Suvorexant  Other (See Comments)    Histories Past Medical History:  Diagnosis Date   Alcoholism ([place])    sober for 57 years-noted 09/16/2018   Allergy    Anxiety    Cholecystitis    Difficulty sleeping    takes med to sleep   Diverticulitis    Gallstones    Ganglion cyst 12/11/2016   GERD (gastroesophageal reflux disease)    "RELATED TO GALLBLADDER"   Heart murmur    "ONLY DETECTED WHEN LYING DOWN"   History of Lyme disease 09/19/2016   Hyperlipidemia    Lyme disease    Pt states she has had Lyme Disease twice.    Nocturia    S/P laparoscopic cholecystectomy 12/23/2014   Seizures ([place])    Tubular adenoma of colon 06/27/2018   Past Surgical History:  Procedure Laterality Date   CHOLECYSTECTOMY N/A 12/23/2014   Procedure: LAPAROSCOPIC CHOLECYSTECTOMY WITH INTRAOPERATIVE CHOLANGIOGRAM;  Surgeon: Greer Pickerel, MD;  Location: [facility];  Service: General;  Laterality: N/A;   THORACIC DISCECTOMY  07/2007   T12   TUBAL LIGATION     WISDOM TOOTH EXTRACTION     Social History   Socioeconomic History   Marital status: Single    Spouse name: Not on file   Number of children: 4   Years of education: 14   Highest education level: Not on file  Occupational History   Occupation: retired    Comment: Equities trader payable and [place] resource strain: Not on file   Food insecurity    Worry: Not on file    Inability: Not on file   Transportation needs    Medical: Not on file    Non-medical: Not on file  Tobacco Use   Smoking status: Never Smoker   Smokeless tobacco: Never Used  Substance and Sexual Activity   Alcohol use: No    Alcohol/week: 0.0 standard drinks    Comment: sober for 47 years-noted 09/16/2018   Drug use: No   Sexual activity: Never  Lifestyle   Physical activity    Days per week: Not on file    Minutes per session: Not on file   Stress: Not on file  Relationships   Social connections    Talks on phone: Not on  file    Gets together: Not on file    Attends religious service: Not on file    Active member of club or organization: Not on file    Attends meetings of clubs or organizations: Not on file    Relationship status: Not on file   Intimate partner violence    Fear of current or ex partner: Not on file    Emotionally abused: Not on file    Physically abused: Not on file    Forced sexual activity: Not on file  Other Topics Concern   Not on file  Social History Narrative   Patient lives alone.   patient has 4 grandchildren.   Patient is retired   Patient has 2 years of college.   Patient is right handed.         Family  History  Problem Relation Age of Onset   Arthritis Father    Alcohol abuse Mother        drinker and smoker   Diabetes Mother    Heart disease Mother    Hypertension Mother    Stroke Mother    Depression Sister    Hearing loss Sister    Hyperlipidemia Sister    Alcohol abuse Daughter    Alcohol abuse Son    Depression Sister    Early death Sister    Alcohol abuse Brother    Depression Sister    Heart disease Sister    Colon cancer Neg Hx    Esophageal cancer Neg Hx    Inflammatory bowel disease Neg Hx    Liver disease Neg Hx    Pancreatic cancer Neg Hx    Rectal cancer Neg Hx    Stomach cancer Neg Hx    I have reviewed her medical, social, and family history in detail and updated the electronic medical record as necessary.   PHYSICAL EXAMINATION  BP (!) 154/90 (BP Location: Left Arm, Patient Position: Sitting, Cuff Size: Normal)    Pulse 76    Temp 98.4 F (36.9 C)    Ht 5' 1.5" (1.562 m) Comment: height measured without shoes   Wt 143 lb 8 oz (65.1 kg)    BMI 26.68 kg/m  Wt Readings from Last 3 Encounters:  09/16/18 143 lb 8 oz (65.1 kg)  09/01/18 144 lb 8 oz (65.5 kg)  08/12/18 145 lb (65.8 kg)  GEN: NAD, appears stated age, doesn't appear chronically ill PSYCH: Cooperative, without pressured speech EYE: Conjunctivae  pink, sclerae anicteric ENT: MMM, without oral ulcers, no erythema or exudates noted NECK: Supple CV: RR without R/Gs  RESP: CTAB posteriorly, without wheezing GI: NABS, soft, NT/ND, without rebound or guarding, no HSM appreciated MSK/EXT: No lower extremity edema SKIN: No jaundice NEURO:  Alert & Oriented x 3, no focal deficits   REVIEW OF DATA  I reviewed the following data at the time of this encounter:  GI Procedures and Studies  Equal colonoscopies from 2020 will need to be obtained Review of what was discussed with me previously was a June 2020 colonoscopy showing thickened fold at 35 to 40 cm which was biopsied and returned as adenoma and subsequent August 2020 sigmoidoscopy consistent with a distal transverse colon polyp that was biopsied showing evidence of adenoma.    Laboratory Studies  Reviewed those in epic  Imaging Studies  No relevant studies to review   ASSESSMENT  Ms. Stricklin is a 78 y.o. female with a pmh significant for anxiety, allergies, status post cholecystectomy, diverticulosis (with prior diverticulitis), GERD, hyperlipidemia, seizures, colon polyps.  The patient is seen today for evaluation and management of:  1. Tubular adenoma of colon   2. History of colonic polyps   3. Abnormal colonoscopy    Based upon the description (although I have not seen the endoscopic pictures and we will work on trying to obtain) I do feel that it is reasonable to pursue an Advanced Polypectomy attempt of the polyp/lesion.  We discussed some of the techniques of advanced polypectomy which include Endoscopic Mucosal Resection, OVESCO Full-Thickness Resection, Endorotor Morcellation, and Tissue Ablation via Fulguration.  The risks and benefits of endoscopic evaluation were discussed with the patient; these include but are not limited to the risk of perforation, infection, bleeding, missed lesions, lack of diagnosis, severe illness requiring hospitalization, as well as anesthesia  and sedation related illnesses.  During attempts at advanced polypectomy, the risks of bleeding and perforation/leak are increased as opposed to diagnostic and screening colonoscopies, and that was discussed with the patient as well.   In addition, I explained that with the possible need for piecemeal resection, subsequent short-interval endoscopic evaluation for follow up and potential retreatment of the lesion/area may be necessary.  I did offer, a referral to surgery in order for patient to have opportunity to discuss surgical management/intervention prior to finalizing decision for attempt at endoscopic removal, however, the patient deferred on this.  If, after attempt at removal of the polyp, it is found that the patient has a complication or that an invasive lesion or malignant lesion is found, or that the polyp continues to recur, the patient is aware and understands that surgery may still be indicated/required.  All patient questions were answered, to the best of my ability, and the patient agrees to the aforementioned plan of action with follow-up as indicated.   PLAN  Laboratories as outlined below Obtain lower endoscopy reports from [facility] from June and August (sigmoidoscopy) Proceed with scheduling colonoscopy with EMR (have Endo Roeder potentially available)   Orders Placed This Encounter  Procedures   CBC   Basic Metabolic Panel (BMET)   INR/PT   Ambulatory referral to Gastroenterology    New Prescriptions   No medications on file   Modified Medications   No medications on file    Planned Follow Up No follow-ups on file.   Justice Britain, MD [facility] Advanced Endoscopy Office # [id number]

## 2018-09-16 NOTE — Patient Instructions (Addendum)
Due to recent COVID-19 restrictions implemented by our local and state authorities and in an effort to keep both patients and staff as safe as possible, our hospital system now requires COVID-19 testing prior to any scheduled hospital procedure. Please go to our [facility] location drive thru testing site ([address] [place], [place] [address]) on 10/16/18 at  10:00am. There will be multiple testing areas, the first checkpoint being for pre-procedure/surgery testing. Get into the right (yellow) lane that leads to the PAT testing team. You will not be billed at the time of testing but may receive a bill later depending on your insurance. The approximate cost of the test is $100. You must agree to quarantine from the time of your testing until the procedure date on 10/20/2018 . This should include staying at home with ONLY the people you live with. Avoid take-out, grocery store shopping or leaving the house for any non-emergent reason. Failure to have your COVID-19 test done on the date and time you have been scheduled will result in cancellation of procedure. Please call our office at [phone] if you have any questions.   Your provider has requested that you go to the basement level for lab work before leaving today. Press "B" on the elevator. The lab is located at the first door on the left as you exit the elevator.  You have been scheduled for a colonoscopy. Please follow written instructions given to you at your visit today.  Please pick up your prep supplies at the pharmacy within the next 1-3 days. If you use inhalers (even only as needed), please bring them with you on the day of your procedure.  We have sent the following medications to your pharmacy for you to pick up at your convenience: Golytely   Thank you for choosing me and [facility].  Dr. Rush Landmark

## 2018-09-17 LAB — BASIC METABOLIC PANEL
BUN: 12 mg/dL (ref 6–23)
CO2: 29 mEq/L (ref 19–32)
Calcium: 9.8 mg/dL (ref 8.4–10.5)
Chloride: 96 mEq/L (ref 96–112)
Creatinine, Ser: 0.6 mg/dL (ref 0.40–1.20)
GFR: 96.15 mL/min (ref >60.00)
Glucose, Bld: 85 mg/dL (ref 70–99)
Potassium: 4.3 mEq/L (ref 3.5–5.1)
Sodium: 132 mEq/L — ABNORMAL LOW (ref 135–145)

## 2018-09-18 DIAGNOSIS — R933 Abnormal findings on diagnostic imaging of other parts of digestive tract: Secondary | ICD-10-CM | POA: Insufficient documentation

## 2018-09-25 ENCOUNTER — Ambulatory Visit (INDEPENDENT_AMBULATORY_CARE_PROVIDER_SITE_OTHER): Payer: Medicare Other | Admitting: Physician Assistant

## 2018-09-25 ENCOUNTER — Other Ambulatory Visit: Payer: Self-pay | Admitting: Physician Assistant

## 2018-09-25 ENCOUNTER — Other Ambulatory Visit: Payer: Self-pay

## 2018-09-25 ENCOUNTER — Encounter: Payer: Self-pay | Admitting: Physician Assistant

## 2018-09-25 VITALS — BP 160/100 | HR 75 | Temp 97.9°F | Ht 61.5 in | Wt 144.5 lb

## 2018-09-25 DIAGNOSIS — R030 Elevated blood-pressure reading, without diagnosis of hypertension: Secondary | ICD-10-CM | POA: Diagnosis not present

## 2018-09-25 DIAGNOSIS — S51812A Laceration without foreign body of left forearm, initial encounter: Secondary | ICD-10-CM | POA: Diagnosis not present

## 2018-09-25 LAB — BASIC METABOLIC PANEL
BUN: 16 mg/dL (ref 6–23)
CO2: 30 mEq/L (ref 19–32)
Calcium: 9.2 mg/dL (ref 8.4–10.5)
Chloride: 98 mEq/L (ref 96–112)
Creatinine, Ser: 0.6 mg/dL (ref 0.40–1.20)
GFR: 96.15 mL/min (ref >60.00)
Glucose, Bld: 91 mg/dL (ref 70–99)
Potassium: 4.3 mEq/L (ref 3.5–5.1)
Sodium: 133 mEq/L — ABNORMAL LOW (ref 135–145)

## 2018-09-25 MED ORDER — AMLODIPINE BESYLATE 5 MG PO TABS: 5.0000 mg | ORAL_TABLET | Freq: Every day | ORAL | 1 refills | Status: DC

## 2018-09-25 NOTE — Patient Instructions (Signed)
It was great to see you!  Please start Norvasc 5 mg for your blood pressure.  Please return to the office in 2-4 weeks with Dr. [name], for further evaluation and treatment.  Take care,  Inda Coke PA-C

## 2018-09-25 NOTE — Progress Notes (Signed)
Joan Mclaughlin is a 78 y.o. female here for a follow up of a pre-existing problem.  I acted as a Education administrator for [employer], PA-C Anselmo Pickler, LPN  History of Present Illness:   Chief Complaint  Patient presents with  . F/U on Elevated blood pressure    HPI  Elevated blood pressure Currently not on any medication. At home blood pressure readings are not checked.  Patient denies chest pain, SOB, blurred vision, dizziness, unusual headaches, lower leg swelling.  Denies excessive caffeine intake, stimulant usage, excessive alcohol intake, or increase in salt consumption.  During our last visit on 08/12/18 we discussed need for cutting out salt and stress management.  Skin tear She sustained a skin tear to her L forearm last night getting out of her tub. She denies any concerns for infection -- purulent discharge, severe pain, odor, fever, worsening redness. She was able to get it cleaned up and bandaged. Has been using neosporin on it.   Past Medical History:  Diagnosis Date  . Alcoholism ([place])    sober for 47 years-noted 09/16/2018  . Allergy   . Anxiety   . Cholecystitis   . Difficulty sleeping    takes med to sleep  . Diverticulitis   . Gallstones   . Ganglion cyst 12/11/2016  . GERD (gastroesophageal reflux disease)    "RELATED TO GALLBLADDER"  . Heart murmur    "ONLY DETECTED WHEN LYING DOWN"  . History of Lyme disease 09/19/2016  . Hyperlipidemia   . Lyme disease    Pt states she has had Lyme Disease twice.   . Nocturia   . S/P laparoscopic cholecystectomy 12/23/2014  . Seizures ([place])   . Tubular adenoma of colon 06/27/2018     Social History   Socioeconomic History  . Marital status: Single    Spouse name: Not on file  . Number of children: 4  . Years of education: 52  . Highest education level: Not on file  Occupational History  . Occupation: retired    Comment: Accounts payable and nanny   Social Needs  . Financial resource strain: Not on file  .  Food insecurity    Worry: Not on file    Inability: Not on file  . Transportation needs    Medical: Not on file    Non-medical: Not on file  Tobacco Use  . Smoking status: Never Smoker  . Smokeless tobacco: Never Used  Substance and Sexual Activity  . Alcohol use: No    Alcohol/week: 0.0 standard drinks    Comment: sober for 47 years-noted 09/16/2018  . Drug use: No  . Sexual activity: Never  Lifestyle  . Physical activity    Days per week: Not on file    Minutes per session: Not on file  . Stress: Not on file  Relationships  . Social Herbalist on phone: Not on file    Gets together: Not on file    Attends religious service: Not on file    Active member of club or organization: Not on file    Attends meetings of clubs or organizations: Not on file    Relationship status: Not on file  . Intimate partner violence    Fear of current or ex partner: Not on file    Emotionally abused: Not on file    Physically abused: Not on file    Forced sexual activity: Not on file  Other Topics Concern  . Not on file  Social History Narrative   Patient lives alone.   patient has 4 grandchildren.   Patient is retired   Patient has 2 years of college.   Patient is right handed.          Past Surgical History:  Procedure Laterality Date  . CHOLECYSTECTOMY N/A 12/23/2014   Procedure: LAPAROSCOPIC CHOLECYSTECTOMY WITH INTRAOPERATIVE CHOLANGIOGRAM;  Surgeon: Greer Pickerel, MD;  Location: [facility];  Service: General;  Laterality: N/A;  . THORACIC DISCECTOMY  07/2007   T12  . TUBAL LIGATION    . WISDOM TOOTH EXTRACTION      Family History  Problem Relation Age of Onset  . Arthritis Father   . Alcohol abuse Mother        drinker and smoker  . Diabetes Mother   . Heart disease Mother   . Hypertension Mother   . Stroke Mother   . Depression Sister   . Hearing loss Sister   . Hyperlipidemia Sister   . Alcohol abuse Daughter   . Alcohol abuse Son   . Depression Sister   .  Early death Sister   . Alcohol abuse Brother   . Depression Sister   . Heart disease Sister   . Colon cancer Neg Hx   . Esophageal cancer Neg Hx   . Inflammatory bowel disease Neg Hx   . Liver disease Neg Hx   . Pancreatic cancer Neg Hx   . Rectal cancer Neg Hx   . Stomach cancer Neg Hx     Allergies  Allergen Reactions  . Amoxicillin-Pot Clavulanate Other (See Comments)  . Suvorexant Other (See Comments)    Current Medications:   Current Outpatient Medications:  .  ALPRAZolam (XANAX) 0.25 MG tablet, TAKE 1/2 TABLET BY MOUTH EVERY NIGHT AT BEDTIME AS NEEDED FOR ANXIETY., Disp: 30 tablet, Rfl: 5 .  B Complex-C (SUPER B COMPLEX PO), Take 1 tablet by mouth daily., Disp: , Rfl:  .  Calcium Carbonate-Vit D-Min (CALCIUM 1200 PO), Take 1 capsule by mouth daily., Disp: , Rfl:  .  Cholecalciferol (VITAMIN D3 PO), Take 1,000 Units by mouth daily., Disp: , Rfl:  .  hydrOXYzine (ATARAX/VISTARIL) 10 MG tablet, Take 10 mg by mouth as needed (sleep)., Disp: , Rfl:  .  MAGNESIUM CITRATE PO, Take 1 tablet by mouth daily., Disp: , Rfl:  .  Misc Natural Products (CHOLESTEROL SUPPORT PO), Take 1 tablet by mouth daily., Disp: , Rfl:  .  Multiple Vitamins-Minerals (MULTIVITAMIN WITH MINERALS) tablet, Take 1 tablet by mouth daily., Disp: , Rfl:  .  Omega-3 Fatty Acids (FISH OIL PO), Take by mouth daily. , Disp: , Rfl:  .  Oxcarbazepine (TRILEPTAL) 300 MG tablet, TAKE 1 TABLET(300 MG) BY MOUTH TWICE DAILY, Disp: 180 tablet, Rfl: 3 .  POTASSIUM CHLORIDE PO, Take 1 tablet by mouth daily., Disp: , Rfl:  .  Red Yeast Rice Extract (RED YEAST RICE PO), Take by mouth daily. , Disp: , Rfl:  .  saccharomyces boulardii (FLORASTOR) 250 MG capsule, Take 1 capsule (250 mg total) by mouth 2 (two) times daily. (Patient taking differently: Take 250 mg by mouth daily. ), Disp: 20 capsule, Rfl: 0 .  Turmeric (QC TUMERIC COMPLEX) 500 MG CAPS, Take 1 capsule by mouth daily., Disp: , Rfl:  .  valACYclovir (VALTREX) 500 MG  tablet, TAKE 1 TO 2 TABLETS BY MOUTH AS NEEDED FOR COLD SORE OUTBREAK, Disp: 30 tablet, Rfl: 0 .  amLODipine (NORVASC) 5 MG tablet, Take 1 tablet (5 mg total)  by mouth daily., Disp: 30 tablet, Rfl: 1   Review of Systems:   ROS Negative unless otherwise specified per HPI.  Vitals:   Vitals:   09/25/18 1047  BP: (!) 160/100  Pulse: 75  Temp: 97.9 F (36.6 C)  TempSrc: Temporal  SpO2: 97%  Weight: 144 lb 8 oz (65.5 kg)  Height: 5' 1.5" (1.562 m)     Body mass index is 26.86 kg/m.  Physical Exam:   Physical Exam Vitals signs and nursing note reviewed.  Constitutional:      General: She is not in acute distress.    Appearance: She is well-developed. She is not ill-appearing or toxic-appearing.  Cardiovascular:     Rate and Rhythm: Normal rate and regular rhythm.     Pulses: Normal pulses.     Heart sounds: Normal heart sounds, S1 normal and S2 normal.     Comments: No LE edema Pulmonary:     Effort: Pulmonary effort is normal.     Breath sounds: Normal breath sounds.  Skin:    General: Skin is warm and dry.     Comments: Small well-approximated skin tear to L forearm, approximately 1 cm, no purulent drainage or tenderness with palpation  Neurological:     Mental Status: She is alert.     GCS: GCS eye subscore is 4. GCS verbal subscore is 5. GCS motor subscore is 6.  Psychiatric:        Speech: Speech normal.        Behavior: Behavior normal. Behavior is cooperative.      Assessment and Plan:   Emerlee was seen today for f/u on elevated blood pressure.  Diagnoses and all orders for this visit:  Elevated blood pressure reading Uncontrolled. I do think its time to start medication and she is in agreement. BMP today to update her labs, recently showed hyponatremia. Start 5 mg Norvasc and follow-up with PCP in 2-4 weeks. Worsening precautions advised. I did instruct her to get a blood pressure cuff at home.  -     Basic metabolic panel  Skin tear of left forearm  without complication, initial encounter Area re-bandaged. No red flags, anticipate that it will heal well. Follow-up if any concerns.  Other orders -     amLODipine (NORVASC) 5 MG tablet; Take 1 tablet (5 mg total) by mouth daily.    . Reviewed expectations re: course of current medical issues. . Discussed self-management of symptoms. . Outlined signs and symptoms indicating need for more acute intervention. . Patient verbalized understanding and all questions were answered. . See orders for this visit as documented in the electronic medical record. . Patient received an After-Visit Summary.  CMA or LPN served as scribe during this visit. History, Physical, and Plan performed by medical provider. The above documentation has been reviewed and is accurate and complete.   Inda Coke, PA-C

## 2018-10-14 ENCOUNTER — Encounter: Payer: Self-pay | Admitting: Family Medicine

## 2018-10-14 ENCOUNTER — Other Ambulatory Visit: Payer: Self-pay

## 2018-10-14 ENCOUNTER — Ambulatory Visit (INDEPENDENT_AMBULATORY_CARE_PROVIDER_SITE_OTHER): Payer: Medicare Other | Admitting: Family Medicine

## 2018-10-14 VITALS — BP 148/74 | HR 73 | Temp 96.9°F | Ht 61.5 in | Wt 144.2 lb

## 2018-10-14 DIAGNOSIS — S41112A Laceration without foreign body of left upper arm, initial encounter: Secondary | ICD-10-CM

## 2018-10-14 DIAGNOSIS — I10 Essential (primary) hypertension: Secondary | ICD-10-CM

## 2018-10-14 DIAGNOSIS — G4700 Insomnia, unspecified: Secondary | ICD-10-CM | POA: Diagnosis not present

## 2018-10-14 DIAGNOSIS — Z6826 Body mass index (BMI) 26.0-26.9, adult: Secondary | ICD-10-CM | POA: Diagnosis not present

## 2018-10-14 DIAGNOSIS — E663 Overweight: Secondary | ICD-10-CM

## 2018-10-14 NOTE — Patient Instructions (Addendum)
It was very nice to see you today!  You BP looks much better today.  Please continue your medication.  Please continue work on cutting down on salt and being more active.  Come back to see me in about 6 months for your annual checkup.  Come back to see me sooner if needed.  Take care, Dr [name]  Please try these tips to maintain a healthy lifestyle:   Eat at least 3 REAL meals and 1-2 snacks per day.  Aim for no more than 5 hours between eating.  If you eat breakfast, please do so within one hour of getting up.    Obtain twice as many fruits/vegetables as protein or carbohydrate foods for both lunch and dinner. (Half of each meal should be fruits/vegetables, one quarter protein, and one quarter starchy carbs)   Cut down on sweet beverages. This includes juice, soda, and sweet tea.    Exercise at least 150 minutes every week.    DASH Eating Plan DASH stands for "Dietary Approaches to Stop Hypertension." The DASH eating plan is a healthy eating plan that has been shown to reduce high blood pressure (hypertension). It may also reduce your risk for type 2 diabetes, heart disease, and stroke. The DASH eating plan may also help with weight loss. What are tips for following this plan?  General guidelines  Avoid eating more than 2,300 mg (milligrams) of salt (sodium) a day. If you have hypertension, you may need to reduce your sodium intake to 1,500 mg a day.  Limit alcohol intake to no more than 1 drink a day for nonpregnant women and 2 drinks a day for men. One drink equals 12 oz of beer, 5 oz of wine, or 1 oz of hard liquor.  Work with your health care provider to maintain a healthy body weight or to lose weight. Ask what an ideal weight is for you.  Get at least 30 minutes of exercise that causes your heart to beat faster (aerobic exercise) most days of the week. Activities may include walking, swimming, or biking.  Work with your health care provider or diet and nutrition  specialist (dietitian) to adjust your eating plan to your individual calorie needs. Reading food labels   Check food labels for the amount of sodium per serving. Choose foods with less than 5 percent of the Daily Value of sodium. Generally, foods with less than 300 mg of sodium per serving fit into this eating plan.  To find whole grains, look for the word "whole" as the first word in the ingredient list. Shopping  Buy products labeled as "low-sodium" or "no salt added."  Buy fresh foods. Avoid canned foods and premade or frozen meals. Cooking  Avoid adding salt when cooking. Use salt-free seasonings or herbs instead of table salt or sea salt. Check with your health care provider or pharmacist before using salt substitutes.  Do not fry foods. Cook foods using healthy methods such as baking, boiling, grilling, and broiling instead.  Cook with heart-healthy oils, such as olive, canola, soybean, or sunflower oil. Meal planning  Eat a balanced diet that includes: ? 5 or more servings of fruits and vegetables each day. At each meal, try to fill half of your plate with fruits and vegetables. ? Up to 6-8 servings of whole grains each day. ? Less than 6 oz of lean meat, poultry, or fish each day. A 3-oz serving of meat is about the same size as a deck of cards. One egg  equals 1 oz. ? 2 servings of low-fat dairy each day. ? A serving of nuts, seeds, or beans 5 times each week. ? Heart-healthy fats. Healthy fats called Omega-3 fatty acids are found in foods such as flaxseeds and coldwater fish, like sardines, salmon, and mackerel.  Limit how much you eat of the following: ? Canned or prepackaged foods. ? Food that is high in trans fat, such as fried foods. ? Food that is high in saturated fat, such as fatty meat. ? Sweets, desserts, sugary drinks, and other foods with added sugar. ? Full-fat dairy products.  Do not salt foods before eating.  Try to eat at least 2 vegetarian meals each  week.  Eat more home-cooked food and less restaurant, buffet, and fast food.  When eating at a restaurant, ask that your food be prepared with less salt or no salt, if possible. What foods are recommended? The items listed may not be a complete list. Talk with your dietitian about what dietary choices are best for you. Grains Whole-grain or whole-wheat bread. Whole-grain or whole-wheat pasta. Brown rice. [place]. Bulgur. Whole-grain and low-sodium cereals. Pita bread. Low-fat, low-sodium crackers. Whole-wheat flour tortillas. Vegetables Fresh or frozen vegetables (raw, steamed, roasted, or grilled). Low-sodium or reduced-sodium tomato and vegetable juice. Low-sodium or reduced-sodium tomato sauce and tomato paste. Low-sodium or reduced-sodium canned vegetables. Fruits All fresh, dried, or frozen fruit. Canned fruit in natural juice (without added sugar). Meat and other protein foods Skinless chicken or [place]. Ground chicken or [place]. Pork with fat trimmed off. Fish and seafood. Egg whites. Dried beans, peas, or lentils. Unsalted nuts, nut butters, and seeds. Unsalted canned beans. Lean cuts of beef with fat trimmed off. Low-sodium, lean deli meat. Dairy Low-fat (1%) or fat-free (skim) milk. Fat-free, low-fat, or reduced-fat cheeses. Nonfat, low-sodium ricotta or cottage cheese. Low-fat or nonfat yogurt. Low-fat, low-sodium cheese. Fats and oils Soft margarine without trans fats. Vegetable oil. Low-fat, reduced-fat, or light mayonnaise and salad dressings (reduced-sodium). Canola, safflower, olive, soybean, and sunflower oils. Avocado. Seasoning and other foods Herbs. Spices. Seasoning mixes without salt. Unsalted popcorn and pretzels. Fat-free sweets. What foods are not recommended? The items listed may not be a complete list. Talk with your dietitian about what dietary choices are best for you. Grains Baked goods made with fat, such as croissants, muffins, or some breads. Dry pasta  or rice meal packs. Vegetables Creamed or fried vegetables. Vegetables in a cheese sauce. Regular canned vegetables (not low-sodium or reduced-sodium). Regular canned tomato sauce and paste (not low-sodium or reduced-sodium). Regular tomato and vegetable juice (not low-sodium or reduced-sodium). Angie Fava. Olives. Fruits Canned fruit in a light or heavy syrup. Fried fruit. Fruit in cream or butter sauce. Meat and other protein foods Fatty cuts of meat. Ribs. Fried meat. Berniece Salines. Sausage. Bologna and other processed lunch meats. Salami. Fatback. Hotdogs. Bratwurst. Salted nuts and seeds. Canned beans with added salt. Canned or smoked fish. Whole eggs or egg yolks. Chicken or [place] with skin. Dairy Whole or 2% milk, cream, and half-and-half. Whole or full-fat cream cheese. Whole-fat or sweetened yogurt. Full-fat cheese. Nondairy creamers. Whipped toppings. Processed cheese and cheese spreads. Fats and oils Butter. Stick margarine. Lard. Shortening. Ghee. Bacon fat. Tropical oils, such as coconut, palm kernel, or palm oil. Seasoning and other foods Salted popcorn and pretzels. Onion salt, garlic salt, seasoned salt, table salt, and sea salt. Worcestershire sauce. Tartar sauce. Barbecue sauce. Teriyaki sauce. Soy sauce, including reduced-sodium. Steak sauce. Canned and packaged gravies. Fish sauce.  Oyster sauce. Cocktail sauce. Horseradish that you find on the shelf. Ketchup. Mustard. Meat flavorings and tenderizers. Bouillon cubes. Hot sauce and Tabasco sauce. Premade or packaged marinades. Premade or packaged taco seasonings. Relishes. Regular salad dressings. Where to find more information:  National Heart, Lung, and [place]: https://wilson-eaton.com/  American Heart Association: www.heart.org Summary  The DASH eating plan is a healthy eating plan that has been shown to reduce high blood pressure (hypertension). It may also reduce your risk for type 2 diabetes, heart disease, and stroke.  With the  DASH eating plan, you should limit salt (sodium) intake to 2,300 mg a day. If you have hypertension, you may need to reduce your sodium intake to 1,500 mg a day.  When on the DASH eating plan, aim to eat more fresh fruits and vegetables, whole grains, lean proteins, low-fat dairy, and heart-healthy fats.  Work with your health care provider or diet and nutrition specialist (dietitian) to adjust your eating plan to your individual calorie needs. This information is not intended to replace advice given to you by your health care provider. Make sure you discuss any questions you have with your health care provider. Document Released: 12/14/2010 Document Revised: 12/07/2016 Document Reviewed: 12/19/2015 Elsevier Patient Education  2020 [employer].

## 2018-10-14 NOTE — Progress Notes (Signed)
   Chief Complaint:  Joan Mclaughlin is a 78 y.o. female who presents today with a chief complaint of hypertension follow up.   Assessment/Plan:  Essential hypertension At goal per JNC 8 guidelines.  Will continue amlodipine 5 mg daily.  Discussed importance of lifestyle modifications.  Discussed importance of low-salt diet and regular cardiovascular exercise.  She will follow-up in 3 to months.  Insomnia Stable.  Continue Xanax and hydroxyzine as needed.  Skin Tear Seems to be healing normally.  Discussed reasons to return to care.   Subjective:  HPI:  Patient seen a few weeks ago by our PA and started on amlodipine 5mg  daily.  She has tolerated well without side effects.  No reported leg swelling.  No reported chest pain or shortness of breath.  She also have a skin tear on her left forearm.  She would like this to be looked at today.  She still has a mild bit of bleeding to the area.  She did thisf several weeks ago.  No redness.  No fevers or chills.  Has been using bandages and limits.  No other treatments tried.  No other obvious aggravating or alleviating factors.   Her stable, chronic medical conditions are outlined below:  # Insomnia -On Xanax 0.125mg  nightly as needed and hydroxyzine 10mg  nightly as needed.  Tolerating well.  ROS: Per HPI  PMH: She reports that she has never smoked. She has never used smokeless tobacco. She reports that she does not drink alcohol or use drugs.      Objective:  Physical Exam: BP (!) 148/74   Pulse 73   Temp (!) 96.9 F (36.1 C)   Ht 5' 1.5" (1.562 m)   Wt 144 lb 3.2 oz (65.4 kg)   SpO2 97%   BMI 26.81 kg/m   Gen: NAD, resting comfortably CV: Regular rate and rhythm with no murmurs appreciated Pulm: Normal work of breathing, clear to auscultation bilaterally with no crackles, wheezes, or rhonchi MSK: No edema, cyanosis, or clubbing noted Skin: Warm, dry.  Well-healing skin tear on left ulnar aspect of her forearm.  No  surrounding erythema.  No open areas. Neuro: Grossly normal, moves all extremities Psych: Normal affect and thought content         M. [name], MD 10/14/2018 3:00 PM

## 2018-10-14 NOTE — Assessment & Plan Note (Signed)
At goal per JNC 8 guidelines.  Will continue amlodipine 5 mg daily.  Discussed importance of lifestyle modifications.  Discussed importance of low-salt diet and regular cardiovascular exercise.  She will follow-up in 3 to months.

## 2018-10-14 NOTE — Assessment & Plan Note (Signed)
Stable.  Continue Xanax and hydroxyzine as needed.

## 2018-10-16 ENCOUNTER — Other Ambulatory Visit: Payer: Self-pay

## 2018-10-16 ENCOUNTER — Encounter (HOSPITAL_COMMUNITY): Payer: Self-pay | Admitting: *Deleted

## 2018-10-16 ENCOUNTER — Other Ambulatory Visit (HOSPITAL_COMMUNITY)
Admission: RE | Admit: 2018-10-16 | Discharge: 2018-10-16 | Disposition: A | Discharge status: Home / Self Care | Payer: Medicare Other | Source: Ambulatory Visit | Attending: Gastroenterology | Admitting: Gastroenterology

## 2018-10-16 DIAGNOSIS — Z01812 Encounter for preprocedural laboratory examination: Secondary | ICD-10-CM | POA: Diagnosis not present

## 2018-10-16 DIAGNOSIS — Z20828 Contact with and (suspected) exposure to other viral communicable diseases: Secondary | ICD-10-CM | POA: Insufficient documentation

## 2018-10-16 NOTE — Progress Notes (Signed)
Joan Mclaughlin   denies chest pain or shortness of breath. Patient was tested for Covid and has been in quarantine since that time.  Joan Mclaughlin reports that she sometimes get a pain in her chest - it is always in the middle of the night; "I take some Tums and it goes away."  Patient denies lightheadedness, shortness of breath or diaphoreses. Joan Mclaughlin reports that she experiences the pain when she eats late at night. Joan Mclaughlin reports that she will be getting here and going home with a transportation company called [employer]. Patient reports that she will not have anyone at home with her.  I explained to patient hat it is a requirement for her to have someone with her after a Colonoscopy.  Patient stated, "I'll be ok."  I notified Maggie in the Endoscopy Dept.

## 2018-10-17 LAB — NOVEL CORONAVIRUS, NAA (HOSP ORDER, SEND-OUT TO REF LAB; TAT 18-24 HRS): SARS-CoV-2, NAA: NOT DETECTED

## 2018-10-20 ENCOUNTER — Other Ambulatory Visit: Payer: Self-pay

## 2018-10-20 ENCOUNTER — Encounter (HOSPITAL_COMMUNITY): Payer: Self-pay

## 2018-10-20 ENCOUNTER — Encounter (HOSPITAL_COMMUNITY)
Admission: RE | Disposition: A | Discharge status: Home / Self Care | Payer: Self-pay | Source: Home / Self Care | Attending: Gastroenterology

## 2018-10-20 ENCOUNTER — Ambulatory Visit (HOSPITAL_COMMUNITY)
Admission: RE | Admit: 2018-10-20 | Discharge: 2018-10-20 | Disposition: A | Discharge status: Home / Self Care | Payer: Medicare Other | Attending: Gastroenterology | Admitting: Gastroenterology

## 2018-10-20 ENCOUNTER — Ambulatory Visit (HOSPITAL_COMMUNITY): Payer: Medicare Other | Admitting: Certified Registered"

## 2018-10-20 DIAGNOSIS — Z8249 Family history of ischemic heart disease and other diseases of the circulatory system: Secondary | ICD-10-CM | POA: Diagnosis not present

## 2018-10-20 DIAGNOSIS — K644 Residual hemorrhoidal skin tags: Secondary | ICD-10-CM | POA: Insufficient documentation

## 2018-10-20 DIAGNOSIS — Z8261 Family history of arthritis: Secondary | ICD-10-CM | POA: Diagnosis not present

## 2018-10-20 DIAGNOSIS — D122 Benign neoplasm of ascending colon: Secondary | ICD-10-CM | POA: Diagnosis not present

## 2018-10-20 DIAGNOSIS — E785 Hyperlipidemia, unspecified: Secondary | ICD-10-CM | POA: Diagnosis not present

## 2018-10-20 DIAGNOSIS — K5730 Diverticulosis of large intestine without perforation or abscess without bleeding: Secondary | ICD-10-CM | POA: Diagnosis not present

## 2018-10-20 DIAGNOSIS — Z880 Allergy status to penicillin: Secondary | ICD-10-CM | POA: Insufficient documentation

## 2018-10-20 DIAGNOSIS — Z833 Family history of diabetes mellitus: Secondary | ICD-10-CM | POA: Diagnosis not present

## 2018-10-20 DIAGNOSIS — D123 Benign neoplasm of transverse colon: Secondary | ICD-10-CM | POA: Diagnosis not present

## 2018-10-20 DIAGNOSIS — G479 Sleep disorder, unspecified: Secondary | ICD-10-CM | POA: Insufficient documentation

## 2018-10-20 DIAGNOSIS — Z811 Family history of alcohol abuse and dependence: Secondary | ICD-10-CM | POA: Insufficient documentation

## 2018-10-20 DIAGNOSIS — I10 Essential (primary) hypertension: Secondary | ICD-10-CM | POA: Insufficient documentation

## 2018-10-20 DIAGNOSIS — R011 Cardiac murmur, unspecified: Secondary | ICD-10-CM | POA: Insufficient documentation

## 2018-10-20 DIAGNOSIS — K641 Second degree hemorrhoids: Secondary | ICD-10-CM | POA: Insufficient documentation

## 2018-10-20 DIAGNOSIS — Z888 Allergy status to other drugs, medicaments and biological substances status: Secondary | ICD-10-CM | POA: Diagnosis not present

## 2018-10-20 DIAGNOSIS — Z9049 Acquired absence of other specified parts of digestive tract: Secondary | ICD-10-CM | POA: Diagnosis not present

## 2018-10-20 DIAGNOSIS — D126 Benign neoplasm of colon, unspecified: Secondary | ICD-10-CM

## 2018-10-20 DIAGNOSIS — K219 Gastro-esophageal reflux disease without esophagitis: Secondary | ICD-10-CM | POA: Diagnosis not present

## 2018-10-20 DIAGNOSIS — Z818 Family history of other mental and behavioral disorders: Secondary | ICD-10-CM | POA: Insufficient documentation

## 2018-10-20 DIAGNOSIS — Z86010 Personal history of colonic polyps: Secondary | ICD-10-CM | POA: Diagnosis not present

## 2018-10-20 DIAGNOSIS — Z820 Family history of epilepsy and other diseases of the nervous system: Secondary | ICD-10-CM | POA: Diagnosis not present

## 2018-10-20 DIAGNOSIS — Z823 Family history of stroke: Secondary | ICD-10-CM | POA: Diagnosis not present

## 2018-10-20 DIAGNOSIS — F419 Anxiety disorder, unspecified: Secondary | ICD-10-CM | POA: Diagnosis not present

## 2018-10-20 DIAGNOSIS — R6889 Other general symptoms and signs: Secondary | ICD-10-CM | POA: Diagnosis not present

## 2018-10-20 HISTORY — DX: Other complications of anesthesia, initial encounter: T88.59XA

## 2018-10-20 HISTORY — PX: HEMOSTASIS CLIP PLACEMENT: SHX6857

## 2018-10-20 HISTORY — PX: COLONOSCOPY WITH PROPOFOL: SHX5780

## 2018-10-20 HISTORY — PX: SUBMUCOSAL LIFTING INJECTION: SHX6855

## 2018-10-20 HISTORY — DX: Essential (primary) hypertension: I10

## 2018-10-20 HISTORY — DX: Nausea with vomiting, unspecified: R11.2

## 2018-10-20 HISTORY — PX: POLYPECTOMY: SHX5525

## 2018-10-20 HISTORY — DX: Other specified postprocedural states: Z98.890

## 2018-10-20 HISTORY — DX: Family history of other specified conditions: Z84.89

## 2018-10-20 HISTORY — PX: ENDOSCOPIC MUCOSAL RESECTION: SHX6839

## 2018-10-20 SURGERY — COLONOSCOPY WITH PROPOFOL
Anesthesia: Monitor Anesthesia Care

## 2018-10-20 MED ORDER — LACTATED RINGERS IV SOLN: INTRAVENOUS | Status: DC

## 2018-10-20 MED ORDER — SODIUM CHLORIDE 0.9 % IV SOLN
INTRAVENOUS | Status: DC | PRN
  Administered 2018-10-20 (×2): via INTRAVENOUS

## 2018-10-20 MED ORDER — LIDOCAINE HCL (CARDIAC) PF 100 MG/5ML IV SOSY
PREFILLED_SYRINGE | INTRAVENOUS | Status: DC | PRN
  Administered 2018-10-20: 40 mg via INTRAVENOUS

## 2018-10-20 MED ORDER — PROPOFOL 10 MG/ML IV BOLUS
INTRAVENOUS | Status: DC | PRN
  Administered 2018-10-20 (×6): 20 mg via INTRAVENOUS

## 2018-10-20 MED ORDER — ONDANSETRON HCL 4 MG/2ML IJ SOLN
INTRAMUSCULAR | Status: DC | PRN
  Administered 2018-10-20: 4 mg via INTRAVENOUS

## 2018-10-20 MED ORDER — SODIUM CHLORIDE 0.9 % IV SOLN
INTRAVENOUS | Status: DC
  Administered 2018-10-20: 13:00:00 via INTRAVENOUS

## 2018-10-20 MED ORDER — PROPOFOL 500 MG/50ML IV EMUL
INTRAVENOUS | Status: DC | PRN
  Administered 2018-10-20: 100 ug/kg/min via INTRAVENOUS

## 2018-10-20 SURGICAL SUPPLY — 21 items

## 2018-10-20 NOTE — Anesthesia Postprocedure Evaluation (Signed)
Anesthesia Post Note  Patient: Joan Mclaughlin  Procedure(s) Performed: COLONOSCOPY WITH PROPOFOL (N/A ) ENDOSCOPIC MUCOSAL RESECTION (N/A ) POLYPECTOMY SUBMUCOSAL LIFTING INJECTION BIOPSY HEMOSTASIS CLIP PLACEMENT     Patient location during evaluation: Endoscopy Anesthesia Type: MAC Level of consciousness: awake and alert Pain management: pain level controlled Vital Signs Assessment: post-procedure vital signs reviewed and stable Respiratory status: spontaneous breathing, nonlabored ventilation and respiratory function stable Cardiovascular status: blood pressure returned to baseline and stable Postop Assessment: no apparent nausea or vomiting Anesthetic complications: no    Last Vitals:  Vitals:   10/20/18 1615 10/20/18 1625  BP: (!) 145/59 (!) 164/53  Pulse: 60 65  Resp: 18 18  SpO2: 100% 97%    Last Pain:  Vitals:   10/20/18 1625  TempSrc:   PainSc: 0-No pain                 Lidia Collum

## 2018-10-20 NOTE — Discharge Instructions (Signed)
YOU HAD AN ENDOSCOPIC PROCEDURE TODAY: Refer to the procedure report and other information in the discharge instructions given to you for any specific questions about what was found during the examination. If this information does not answer your questions, please call [place] office at [phone] to clarify.  ° °YOU SHOULD EXPECT: Some feelings of bloating in the abdomen. Passage of more gas than usual. Walking can help get rid of the air that was put into your GI tract during the procedure and reduce the bloating. If you had a lower endoscopy (such as a colonoscopy or flexible sigmoidoscopy) you may notice spotting of blood in your stool or on the toilet paper. Some abdominal soreness may be present for a day or two, also. ° °DIET: Your first meal following the procedure should be a light meal and then it is ok to progress to your normal diet. A half-sandwich or bowl of soup is an example of a good first meal. Heavy or fried foods are harder to digest and may make you feel nauseous or bloated. Drink plenty of fluids but you should avoid alcoholic beverages for 24 hours. If you had a esophageal dilation, please see attached instructions for diet.   ° °ACTIVITY: Your care partner should take you home directly after the procedure. You should plan to take it easy, moving slowly for the rest of the day. You can resume normal activity the day after the procedure however YOU SHOULD NOT DRIVE, use power tools, machinery or perform tasks that involve climbing or major physical exertion for 24 hours (because of the sedation medicines used during the test).  ° °SYMPTOMS TO REPORT IMMEDIATELY: °A gastroenterologist can be reached at any hour. Please call [phone]  for any of the following symptoms:  °Following lower endoscopy (colonoscopy, flexible sigmoidoscopy) °Excessive amounts of blood in the stool  °Significant tenderness, worsening of abdominal pains  °Swelling of the abdomen that is new, acute  °Fever of 100° or  higher  °Following upper endoscopy (EGD, EUS, ERCP, esophageal dilation) °Vomiting of blood or coffee ground material  °New, significant abdominal pain  °New, significant chest pain or pain under the shoulder blades  °Painful or persistently difficult swallowing  °New shortness of breath  °Black, tarry-looking or red, bloody stools ° °FOLLOW UP:  °If any biopsies were taken you will be contacted by phone or by letter within the next 1-3 weeks. Call [phone]  if you have not heard about the biopsies in 3 weeks.  °Please also call with any specific questions about appointments or follow up tests. ° °

## 2018-10-20 NOTE — Op Note (Signed)
[facility] Patient Name: Joan Mclaughlin Procedure Date : 10/20/2018 MRN: [id number] Attending MD: Justice Britain , MD Date of Birth: 1940/12/16 CSN: [id number] Age: 78 Admit Type: Outpatient Procedure:                Colonoscopy Indications:              Excision of colonic polyp Providers:                Justice Britain, MD, Carlyn Reichert, RN, Elspeth Cho Tech., Technician, Phill Myron. Proofreader, Referring MD:             Ronnette Juniper, MD, Joyice Faster. Oletta Lamas Dr., MD, [name].                            [place]arker Medicines:                Monitored Anesthesia Care Complications:            No immediate complications. Estimated Blood Loss:     Estimated blood loss was minimal. Procedure:                Pre-Anesthesia Assessment:                           - Prior to the procedure, a History and Physical                            was performed, and patient medications and                            allergies were reviewed. The patient's tolerance of                            previous anesthesia was also reviewed. The risks                            and benefits of the procedure and the sedation                            options and risks were discussed with the patient.                            All questions were answered, and informed consent                            was obtained. Prior Anticoagulants: The patient has                            taken no previous anticoagulant or antiplatelet                            agents. ASA Grade Assessment: II - A patient with  mild systemic disease. After reviewing the risks                            and benefits, the patient was deemed in                            satisfactory condition to undergo the procedure.                           After obtaining informed consent, the colonoscope                            was passed under direct vision. Throughout the                       procedure, the patient's blood pressure, pulse, and                            oxygen saturations were monitored continuously. The                            PCF-H190DL ([id number]) Olympus pediatric colonoscope                            was introduced through the anus and advanced to the                            5 cm into the ileum. The colonoscopy was                            technically difficult and complex. Successful                            completion of the procedure was aided by performing                            the maneuvers documented (below) in this report. Scope In: 2:29:43 PM Scope Out: 3:44:30 PM Scope Withdrawal Time: 1 hour 10 minutes 5 seconds  Total Procedure Duration: 1 hour 14 minutes 47 seconds  Findings:      The digital rectal exam findings include hemorrhoids. Pertinent       negatives include no palpable rectal lesions.      The terminal ileum and ileocecal valve appeared normal.      Nine sessile polyps were found in the transverse colon (3) and ascending       colon (6). The polyps were 2 to 6 mm in size. These polyps were removed       with a cold snare. Resection and retrieval were complete.      A medium post polypectomy scar was found in the distal transverse colon.       This was adjacent to tattoo in the region. There was residual polyp       tissue present described below.      An at least 35-40 mm polyp was found in the distal transverse colon. The       polyp was sessile. As  noted above there was scar site fibrosis present       distally. Tattoo was noted below portions of the polyp. Preparations       were made for mucosal resection. Orise gel was injected to raise the       lesion. Piecemeal mucosal resection using a snare was performed.       Resection and retrieval were complete. To close as much of the defect as       possible after mucosal resection, seven hemostatic clips were       successfully placed (MR  conditional). Complete closure was not possible       due to the size and orientation of the polyp. There was no bleeding at       the end of the procedure.      Multiple small-mouthed diverticula were found in the recto-sigmoid colon       and sigmoid colon.      Normal mucosa was found in the entire colon otherwise.      Non-bleeding non-thrombosed external and internal hemorrhoids were found       during retroflexion, during perianal exam and during digital exam. The       hemorrhoids were Grade II (internal hemorrhoids that prolapse but reduce       spontaneously). Impression:               - Hemorrhoids found on digital rectal exam.                           - The examined portion of the ileum was normal.                           - Nine 2 to 6 mm polyps in the transverse colon and                            in the ascending colon, removed with a cold snare.                            Resected and retrieved.                           - Post-polypectomy scar in the distal transverse                            colon.                           - One 35-40 mm polyp in the distal transverse colon                            - previously worked on, removed with piecemeal                            mucosal resection. Resected and retrieved. Clips                            (MR conditional) were placed.                           -  Diverticulosis in the recto-sigmoid colon and in                            the sigmoid colon.                           - Normal mucosa in the entire examined colon.                           - Non-bleeding non-thrombosed external and internal                            hemorrhoids. Recommendation:           - The patient will be observed post-procedure,                            until all discharge criteria are met.                           - Discharge patient to home.                           - Patient has a contact number available for                             emergencies. The signs and symptoms of potential                            delayed complications were discussed with the                            patient. Return to normal activities tomorrow.                            Written discharge instructions were provided to the                            patient.                           - Low fiber diet for 1 week.                           - No aspirin, ibuprofen, naproxen, or other                            non-steroidal anti-inflammatory drugs for 1 week                            after polyp removal.                           - Await pathology results.                           - Repeat colonoscopy in 6  months for surveillance                            after piecemeal polypectomy and have [facility]                            [facility] [facility] available.                           - The findings and recommendations were discussed                            with the patient. Procedure Code(s):        --- Professional ---                           [phone], Colonoscopy, flexible; with endoscopic                            mucosal resection                           45385, 57, Colonoscopy, flexible; with removal of                            tumor(s), polyp(s), or other lesion(s) by snare                            technique Diagnosis Code(s):        --- Professional ---                           K64.1, Second degree hemorrhoids                           K63.5, Polyp of colon                           Z98.890, Other specified postprocedural states                           K57.30, Diverticulosis of large intestine without                            perforation or abscess without bleeding CPT copyright 2019 American Medical Association. All rights reserved. The codes documented in this report are preliminary and upon coder review may  be revised to meet current compliance requirements. Justice Britain, MD 10/20/2018 4:08:37 PM Number  of Addenda: 0

## 2018-10-20 NOTE — Transfer of Care (Signed)
Immediate Anesthesia Transfer of Care Note  Patient: Joan Mclaughlin  Procedure(s) Performed: COLONOSCOPY WITH PROPOFOL (N/A ) ENDOSCOPIC MUCOSAL RESECTION (N/A ) POLYPECTOMY SUBMUCOSAL LIFTING INJECTION BIOPSY HEMOSTASIS CLIP PLACEMENT  Patient Location: Endoscopy Unit  Anesthesia Type:MAC  Level of Consciousness: drowsy  Airway & Oxygen Therapy: Patient Spontanous Breathing and Patient connected to face mask oxygen  Post-op Assessment: Report given to RN and Post -op Vital signs reviewed and stable  Post vital signs: Reviewed  Last Vitals:  Vitals Value Taken Time  BP    Temp    Pulse    Resp    SpO2      Last Pain:  Vitals:   10/20/18 1212  TempSrc: Oral  PainSc: 0-No pain         Complications: No apparent anesthesia complications

## 2018-10-20 NOTE — H&P (Signed)
GASTROENTEROLOGY PROCEDURE H&P NOTE   Primary Care Physician: Vivi Barrack, MD  HPI: Joan Mclaughlin is a 78 y.o. female who presents for Colonoscopy with EMR attempt.  Past Medical History:  Diagnosis Date   Alcoholism ([place])    sober for 80 years-noted 09/16/2018   Allergy    Anxiety    Cholecystitis    Complication of anesthesia    Difficulty sleeping    takes med to sleep   Diverticulitis    Family history of adverse reaction to anesthesia    SIster - N/V   Gallstones    Ganglion cyst 12/11/2016   GERD (gastroesophageal reflux disease)    "RELATED TO GALLBLADDER"   Heart murmur    "ONLY DETECTED WHEN LYING DOWN"  - 10/16/2018- Not concerned with   History of Lyme disease 09/19/2016   Hyperlipidemia    Hypertension    Lyme disease    Pt states she has had Lyme Disease twice.    Nocturia    PONV (postoperative nausea and vomiting)    Nausea   S/P laparoscopic cholecystectomy 12/23/2014   Seizures ([place])    Seizures ([place])    "last one 10- 15 years ago - 10/16/2018   Tubular adenoma of colon 06/27/2018   Past Surgical History:  Procedure Laterality Date   CHOLECYSTECTOMY N/A 12/23/2014   Procedure: LAPAROSCOPIC CHOLECYSTECTOMY WITH INTRAOPERATIVE CHOLANGIOGRAM;  Surgeon: Greer Pickerel, MD;  Location: [facility];  Service: General;  Laterality: N/A;   COLONOSCOPY     "3 times trying to get large polyp since August 2020"   THORACIC DISCECTOMY  07/2007   T12   TUBAL LIGATION     WISDOM TOOTH EXTRACTION     Current Facility-Administered Medications  Medication Dose Route Frequency Provider Last Rate Last Dose   0.9 %  sodium chloride infusion   Intravenous Continuous Mansouraty, Telford Nab., MD 20 mL/hr at 10/20/18 1241     lactated ringers infusion   Intravenous Continuous Mansouraty, Telford Nab., MD       Allergies  Allergen Reactions   Amoxicillin-Pot Clavulanate Nausea And Vomiting    Did it involve swelling of the  face/tongue/throat, SOB, or low BP? No Did it involve sudden or severe rash/hives, skin peeling, or any reaction on the inside of your mouth or nose? No Did you need to seek medical attention at a hospital or doctor's office? No When did it last happen?in 2016 If all above answers are NO, may proceed with cephalosporin use.    Suvorexant Other (See Comments)    Nightmares    Family History  Problem Relation Age of Onset   Arthritis Father    Alcohol abuse Mother        drinker and smoker   Diabetes Mother    Heart disease Mother    Hypertension Mother    Stroke Mother    Depression Sister    Hearing loss Sister    Hyperlipidemia Sister    Alcohol abuse Daughter    Alcohol abuse Son    Depression Sister    Early death Sister    Alcohol abuse Brother    Depression Sister    Heart disease Sister    Colon cancer Neg Hx    Esophageal cancer Neg Hx    Inflammatory bowel disease Neg Hx    Liver disease Neg Hx    Pancreatic cancer Neg Hx    Rectal cancer Neg Hx    Stomach cancer Neg Hx    Social  History   Socioeconomic History   Marital status: Single    Spouse name: Not on file   Number of children: 4   Years of education: 14   Highest education level: Not on file  Occupational History   Occupation: retired    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 strain: Not on file   Food insecurity    Worry: Not on file    Inability: Not on file   Transportation needs    Medical: Not on file    Non-medical: Not on file  Tobacco Use   Smoking status: Never Smoker   Smokeless tobacco: Never Used  Substance and Sexual Activity   Alcohol use: No    Alcohol/week: 0.0 standard drinks    Comment: sober for 47 years-noted 09/16/2018   Drug use: No   Sexual activity: Never  Lifestyle   Physical activity    Days per week: Not on file    Minutes per session: Not on file   Stress: Not on file    Relationships   Social connections    Talks on phone: Not on file    Gets together: Not on file    Attends religious service: Not on file    Active member of club or organization: Not on file    Attends meetings of clubs or organizations: Not on file    Relationship status: Not on file   Intimate partner violence    Fear of current or ex partner: Not on file    Emotionally abused: Not on file    Physically abused: Not on file    Forced sexual activity: Not on file  Other Topics Concern   Not on file  Social History Narrative   Patient lives alone.   patient has 4 grandchildren.   Patient is retired   Patient has 2 years of college.   Patient is right handed.          Physical Exam: Vital signs in last 24 hours: Pulse Rate:  [71] 71 (10/12 1212) Resp:  [17] 17 (10/12 1212) BP: (149)/(69) 149/69 (10/12 1212) SpO2:  [100 %] 100 % (10/12 1212) Weight:  [65.3 kg] 65.3 kg (10/12 1212)   GEN: NAD EYE: Sclerae anicteric ENT: MMM CV: Non-tachycardic GI: Soft, NT/ND NEURO:  Alert & Oriented x 3  Lab Results: No results for input(s): WBC, HGB, HCT, PLT in the last 72 hours. BMET No results for input(s): NA, K, CL, CO2, GLUCOSE, BUN, CREATININE, CALCIUM in the last 72 hours. LFT No results for input(s): PROT, ALBUMIN, AST, ALT, ALKPHOS, BILITOT, BILIDIR, IBILI in the last 72 hours. PT/INR No results for input(s): LABPROT, INR in the last 72 hours.   Impression / Plan: This is a 78 y.o.female who presents for Colonoscopy with EMR attempt.  The risks and benefits of endoscopic evaluation were discussed with the patient; these include but are not limited to the risk of perforation, infection, bleeding, missed lesions, lack of diagnosis, severe illness requiring hospitalization, as well as anesthesia and sedation related illnesses.  The patient is agreeable to proceed.    Justice Britain, MD [facility] Office # [phone]

## 2018-10-20 NOTE — Anesthesia Preprocedure Evaluation (Signed)
Anesthesia Evaluation  Patient identified by MRN, date of birth, ID band Patient awake    Reviewed: Allergy & Precautions, NPO status , Patient's Chart, lab work & pertinent test results  Airway Mallampati: II  TM Distance: >3 FB Neck ROM: Full    Dental no notable dental hx.    Pulmonary neg pulmonary ROS,    Pulmonary exam normal breath sounds clear to auscultation       Cardiovascular hypertension, Normal cardiovascular exam Rhythm:Regular Rate:Normal     Neuro/Psych Seizures -, Well Controlled,  Anxiety    GI/Hepatic negative GI ROS, Neg liver ROS,   Endo/Other  negative endocrine ROS  Renal/GU negative Renal ROS  negative genitourinary   Musculoskeletal negative musculoskeletal ROS (+)   Abdominal   Peds negative pediatric ROS (+)  Hematology negative hematology ROS (+)   Anesthesia Other Findings   Reproductive/Obstetrics negative OB ROS                             Anesthesia Physical Anesthesia Plan  ASA: III  Anesthesia Plan: MAC   Post-op Pain Management:    Induction: Intravenous  PONV Risk Score and Plan: 0  Airway Management Planned: Simple Face Mask  Additional Equipment:   Intra-op Plan:   Post-operative Plan:   Informed Consent: I have reviewed the patients History and Physical, chart, labs and discussed the procedure including the risks, benefits and alternatives for the proposed anesthesia with the patient or authorized representative who has indicated his/her understanding and acceptance.     Dental advisory given  Plan Discussed with: CRNA and Surgeon  Anesthesia Plan Comments:         Anesthesia Quick Evaluation

## 2018-10-21 ENCOUNTER — Encounter (HOSPITAL_COMMUNITY): Payer: Self-pay | Admitting: Gastroenterology

## 2018-10-21 LAB — SURGICAL PATHOLOGY

## 2018-10-22 ENCOUNTER — Other Ambulatory Visit: Payer: Self-pay | Admitting: Physician Assistant

## 2018-10-23 ENCOUNTER — Encounter: Payer: Self-pay | Admitting: Gastroenterology

## 2018-11-11 ENCOUNTER — Other Ambulatory Visit: Payer: Self-pay

## 2018-11-11 MED ORDER — ALPRAZOLAM 0.25 MG PO TABS: ORAL_TABLET | ORAL | 5 refills | Status: DC

## 2018-11-19 ENCOUNTER — Ambulatory Visit: Payer: Medicare Other | Admitting: Podiatry

## 2018-11-19 ENCOUNTER — Other Ambulatory Visit: Payer: Self-pay

## 2018-11-19 ENCOUNTER — Encounter: Payer: Self-pay | Admitting: Podiatry

## 2018-11-19 VITALS — BP 153/83 | HR 75 | Resp 16

## 2018-11-19 DIAGNOSIS — L600 Ingrowing nail: Secondary | ICD-10-CM

## 2018-11-24 NOTE — Progress Notes (Signed)
   Subjective: Patient presents today for evaluation of intermittent pain to the bilateral 2nd toenails that began several years ago. Patient is concerned for possible ingrown nail. She states she gets pedicures regularly and is currently not experiencing any pain. There are no modifying factors noted. Patient presents today for further treatment and evaluation.  Past Medical History:  Diagnosis Date  . Alcoholism ([place])    sober for 47 years-noted 09/16/2018  . Allergy   . Anxiety   . Cholecystitis   . Complication of anesthesia   . Difficulty sleeping    takes med to sleep  . Diverticulitis   . Family history of adverse reaction to anesthesia    SIster - N/V  . Gallstones   . Ganglion cyst 12/11/2016  . GERD (gastroesophageal reflux disease)    "RELATED TO GALLBLADDER"  . Heart murmur    "ONLY DETECTED WHEN LYING DOWN"  - 10/16/2018- Not concerned with  . History of Lyme disease 09/19/2016  . Hyperlipidemia   . Hypertension   . Lyme disease    Pt states she has had Lyme Disease twice.   . Nocturia   . PONV (postoperative nausea and vomiting)    Nausea  . S/P laparoscopic cholecystectomy 12/23/2014  . Seizures ([place])   . Seizures ([place])    "last one 10- 15 years ago - 10/16/2018  . Tubular adenoma of colon 06/27/2018    Objective:  General: Well developed, nourished, in no acute distress, alert and oriented x3   Dermatology: Skin is warm, dry and supple bilateral. Bilateral second toenails appears to be erythematous with evidence of an ingrowing nail. Pain on palpation noted to the border of the nail fold. The remaining nails appear unremarkable at this time. There are no open sores, lesions.  Vascular: Dorsalis Pedis artery and Posterior Tibial artery pedal pulses palpable. No lower extremity edema noted.   Neruologic: Grossly intact via light touch bilateral.  Musculoskeletal: Muscular strength within normal limits in all groups bilateral. Normal range of motion noted to all  pedal and ankle joints.   Assesement: #1 Paronychia with ingrowing nail bilateral 2nd toes  #2 Pain in toe #3 Incurvated nail  Plan of Care:  1. Patient evaluated.  2. Mechanical debridement of 2nd toenails bilaterally performed using a nail nipper. Filed with dremel without incident.  3. Recommended good foot hygiene.  4. Return to clinic as needed.    Edrick Kins, DPM [facility]  Dr. Edrick Kins, [address]                                        [place], [place] [address]                Office [phone]  Fax [phone]

## 2018-11-28 DIAGNOSIS — M26629 Arthralgia of temporomandibular joint, unspecified side: Secondary | ICD-10-CM | POA: Diagnosis not present

## 2018-11-28 DIAGNOSIS — L299 Pruritus, unspecified: Secondary | ICD-10-CM | POA: Diagnosis not present

## 2018-12-15 ENCOUNTER — Telehealth: Payer: Self-pay | Admitting: Family Medicine

## 2018-12-15 NOTE — Telephone Encounter (Signed)
I called the patient to schedule AWV with Loma Sousa, but there was no answer and no option to leave a message. If patient calls back, please schedule Medicare Wellness Visit at next available opening.  VDM (Dee-Dee)

## 2018-12-25 ENCOUNTER — Other Ambulatory Visit: Payer: Self-pay

## 2018-12-25 DIAGNOSIS — C44629 Squamous cell carcinoma of skin of left upper limb, including shoulder: Secondary | ICD-10-CM | POA: Diagnosis not present

## 2018-12-25 DIAGNOSIS — C4492 Squamous cell carcinoma of skin, unspecified: Secondary | ICD-10-CM

## 2018-12-25 DIAGNOSIS — D229 Melanocytic nevi, unspecified: Secondary | ICD-10-CM | POA: Diagnosis not present

## 2018-12-25 DIAGNOSIS — C44622 Squamous cell carcinoma of skin of right upper limb, including shoulder: Secondary | ICD-10-CM | POA: Diagnosis not present

## 2018-12-25 HISTORY — DX: Squamous cell carcinoma of skin, unspecified: C44.92

## 2019-01-13 ENCOUNTER — Other Ambulatory Visit: Payer: Self-pay | Admitting: Family Medicine

## 2019-01-19 ENCOUNTER — Ambulatory Visit (INDEPENDENT_AMBULATORY_CARE_PROVIDER_SITE_OTHER): Payer: Medicare Other

## 2019-01-19 VITALS — BP 130/74 | HR 78 | Temp 97.7°F | Ht 62.0 in | Wt 147.4 lb

## 2019-01-19 DIAGNOSIS — Z0000 Encounter for general adult medical examination without abnormal findings: Secondary | ICD-10-CM

## 2019-01-19 NOTE — Patient Instructions (Addendum)
Joan Mclaughlin , Thank you for taking time to come for your Medicare Wellness Visit. I appreciate your ongoing commitment to your health goals. Please review the following plan we discussed and let me know if I can assist you in the future.   Screening recommendations/referrals: Colorectal Screening: up to date; last 10/20/18 Mammogram: up to date; last 03/04/18 Bone Density: recommended   Vision and Dental Exams: Recommended annual ophthalmology exams for early detection of glaucoma and other disorders of the eye Recommended annual dental exams for proper oral hygiene  Vaccinations: Influenza vaccine:  recommended this fall either at PCP office or through your local pharmacy  Pneumococcal vaccine: up to date; last 05/16/16 Tdap vaccine: up to date; last 10/22/17 Shingles vaccine: Shingrix completed   Advanced directives: We have received a copy of your POA (Power of [place]) and/or Living Will. These documents can be located in your chart.  Goals: Recommend to drink at least 6-8 8oz glasses of water per day and consume a balanced diet rich in fresh fruits and vegetables.   Next appointment: Please schedule your Annual Wellness Visit with your Nurse Health Advisor in one year.  Preventive Care 81 Years and Older, Female Preventive care refers to lifestyle choices and visits with your health care provider that can promote health and wellness. What does preventive care include?  A yearly physical exam. This is also called an annual well check.  Dental exams once or twice a year.  Routine eye exams. Ask your health care provider how often you should have your eyes checked.  Personal lifestyle choices, including:  Daily care of your teeth and gums.  Regular physical activity.  Eating a healthy diet.  Avoiding tobacco and drug use.  Limiting alcohol use.  Practicing safe sex.  Taking low-dose aspirin every day if recommended by your health care provider.  Taking vitamin and  mineral supplements as recommended by your health care provider. What happens during an annual well check? The services and screenings done by your health care provider during your annual well check will depend on your age, overall health, lifestyle risk factors, and family history of disease. Counseling  Your health care provider may ask you questions about your:  Alcohol use.  Tobacco use.  Drug use.  Emotional well-being.  Home and relationship well-being.  Sexual activity.  Eating habits.  History of falls.  Memory and ability to understand (cognition).  Work and work Statistician.  Reproductive health. Screening  You may have the following tests or measurements:  Height, weight, and BMI.  Blood pressure.  Lipid and cholesterol levels. These may be checked every 5 years, or more frequently if you are over 63 years old.  Skin check.  Lung cancer screening. You may have this screening every year starting at age 45 if you have a 30-pack-year history of smoking and currently smoke or have quit within the past 15 years.  Fecal occult blood test (FOBT) of the stool. You may have this test every year starting at age 34.  Flexible sigmoidoscopy or colonoscopy. You may have a sigmoidoscopy every 5 years or a colonoscopy every 10 years starting at age 109.  Hepatitis C blood test.  Hepatitis B blood test.  Sexually transmitted disease (STD) testing.  Diabetes screening. This is done by checking your blood sugar (glucose) after you have not eaten for a while (fasting). You may have this done every 1-3 years.  Bone density scan. This is done to screen for osteoporosis. You may have  this done starting at age 28.  Mammogram. This may be done every 1-2 years. Talk to your health care provider about how often you should have regular mammograms. Talk with your health care provider about your test results, treatment options, and if necessary, the need for more tests. Vaccines   Your health care provider may recommend certain vaccines, such as:  Influenza vaccine. This is recommended every year.  Tetanus, diphtheria, and acellular pertussis (Tdap, Td) vaccine. You may need a Td booster every 10 years.  Zoster vaccine. You may need this after age 69.  Pneumococcal 13-valent conjugate (PCV13) vaccine. One dose is recommended after age 3.  Pneumococcal polysaccharide (PPSV23) vaccine. One dose is recommended after age 74. Talk to your health care provider about which screenings and vaccines you need and how often you need them. This information is not intended to replace advice given to you by your health care provider. Make sure you discuss any questions you have with your health care provider. Document Released: 01/21/2015 Document Revised: 09/14/2015 Document Reviewed: 10/26/2014 Elsevier Interactive Patient Education  2017 [employer] Prevention in the Home Falls can cause injuries. They can happen to people of all ages. There are many things you can do to make your home safe and to help prevent falls. What can I do on the outside of my home?  Regularly fix the edges of walkways and driveways and fix any cracks.  Remove anything that might make you trip as you walk through a door, such as a raised step or threshold.  Trim any bushes or trees on the path to your home.  Use bright outdoor lighting.  Clear any walking paths of anything that might make someone trip, such as rocks or tools.  Regularly check to see if handrails are loose or broken. Make sure that both sides of any steps have handrails.  Any raised decks and porches should have guardrails on the edges.  Have any leaves, snow, or ice cleared regularly.  Use sand or salt on walking paths during winter.  Clean up any spills in your garage right away. This includes oil or grease spills. What can I do in the bathroom?  Use night lights.  Install grab bars by the toilet and in the  tub and shower. Do not use towel bars as grab bars.  Use non-skid mats or decals in the tub or shower.  If you need to sit down in the shower, use a plastic, non-slip stool.  Keep the floor dry. Clean up any water that spills on the floor as soon as it happens.  Remove soap buildup in the tub or shower regularly.  Attach bath mats securely with double-sided non-slip rug tape.  Do not have throw rugs and other things on the floor that can make you trip. What can I do in the bedroom?  Use night lights.  Make sure that you have a light by your bed that is easy to reach.  Do not use any sheets or blankets that are too big for your bed. They should not hang down onto the floor.  Have a firm chair that has side arms. You can use this for support while you get dressed.  Do not have throw rugs and other things on the floor that can make you trip. What can I do in the kitchen?  Clean up any spills right away.  Avoid walking on wet floors.  Keep items that you use a lot in easy-to-reach  places.  If you need to reach something above you, use a strong step stool that has a grab bar.  Keep electrical cords out of the way.  Do not use floor polish or wax that makes floors slippery. If you must use wax, use non-skid floor wax.  Do not have throw rugs and other things on the floor that can make you trip. What can I do with my stairs?  Do not leave any items on the stairs.  Make sure that there are handrails on both sides of the stairs and use them. Fix handrails that are broken or loose. Make sure that handrails are as long as the stairways.  Check any carpeting to make sure that it is firmly attached to the stairs. Fix any carpet that is loose or worn.  Avoid having throw rugs at the top or bottom of the stairs. If you do have throw rugs, attach them to the floor with carpet tape.  Make sure that you have a light switch at the top of the stairs and the bottom of the stairs. If you  do not have them, ask someone to add them for you. What else can I do to help prevent falls?  Wear shoes that:  Do not have high heels.  Have rubber bottoms.  Are comfortable and fit you well.  Are closed at the toe. Do not wear sandals.  If you use a stepladder:  Make sure that it is fully opened. Do not climb a closed stepladder.  Make sure that both sides of the stepladder are locked into place.  Ask someone to hold it for you, if possible.  Clearly mark and make sure that you can see:  Any grab bars or handrails.  First and last steps.  Where the edge of each step is.  Use tools that help you move around (mobility aids) if they are needed. These include:  Canes.  Walkers.  Scooters.  Crutches.  Turn on the lights when you go into a dark area. Replace any light bulbs as soon as they burn out.  Set up your furniture so you have a clear path. Avoid moving your furniture around.  If any of your floors are uneven, fix them.  If there are any pets around you, be aware of where they are.  Review your medicines with your doctor. Some medicines can make you feel dizzy. This can increase your chance of falling. Ask your doctor what other things that you can do to help prevent falls. This information is not intended to replace advice given to you by your health care provider. Make sure you discuss any questions you have with your health care provider. Document Released: 10/21/2008 Document Revised: 06/02/2015 Document Reviewed: 01/29/2014 Elsevier Interactive Patient Education  2017 [employer].

## 2019-01-19 NOTE — Progress Notes (Signed)
Subjective:   Joan Mclaughlin is a 79 y.o. female who presents for Medicare Annual (Subsequent) preventive examination.  Review of Systems:   Cardiac Risk Factors include: advanced age (>36men, >64 women);dyslipidemia;hypertension    Objective:     Vitals: BP 130/74   Pulse 78   Temp 97.7 F (36.5 C) (Temporal)   Ht 5\' 2"  (1.575 m)   Wt 147 lb 6.4 oz (66.9 kg)   BMI 26.96 kg/m   Body mass index is 26.96 kg/m.  Advanced Directives 01/19/2019 10/20/2018 12/03/2017 11/27/2016 12/23/2014 12/23/2014 12/21/2014  Does Patient Have a Medical Advance Directive? Yes Yes Yes No Yes - Yes  Type of Paramedic of [place];Living will [place];Living will [place];Living will - [place];Living will - [place];Living will  Does patient want to make changes to medical advance directive? No - Patient declined - No - Patient declined - - - -  Copy of [place] in Chart? Yes - validated most recent copy scanned in chart (See row information) No - copy requested Yes - validated most recent copy scanned in chart (See row information) - No - copy requested (No Data) No - copy requested  Would patient like information on creating a medical advance directive? - - - Yes (MAU/Ambulatory/Procedural Areas - Information given) - - -    Tobacco Social History   Tobacco Use  Smoking Status Never Smoker  Smokeless Tobacco Never Used     Counseling given: Not Answered   Clinical Intake:  Pre-visit preparation completed: Yes  Pain : No/denies pain  Diabetes: No  How often do you need to have someone help you when you read instructions, pamphlets, or other written materials from your doctor or pharmacy?: 1 - Never  Interpreter Needed?: No  Information entered by :: Denman George LPN  Past Medical History:  Diagnosis Date  . Alcoholism ([place])    sober for 47  years-noted 09/16/2018  . Allergy   . Anxiety   . Cholecystitis   . Complication of anesthesia   . Difficulty sleeping    takes med to sleep  . Diverticulitis   . Family history of adverse reaction to anesthesia    [place] - N/V  . Gallstones   . Ganglion cyst 12/11/2016  . GERD (gastroesophageal reflux disease)    "RELATED TO GALLBLADDER"  . Heart murmur    "ONLY DETECTED WHEN LYING DOWN"  - 10/16/2018- Not concerned with  . History of Lyme disease 09/19/2016  . Hyperlipidemia   . Hypertension   . Lyme disease    Pt states she has had Lyme Disease twice.   . Nocturia   . PONV (postoperative nausea and vomiting)    Nausea  . S/P laparoscopic cholecystectomy 12/23/2014  . Seizures ([place])   . Seizures ([place])    "last one 10- 15 years ago - 10/16/2018  . Tubular adenoma of colon 06/27/2018   Past Surgical History:  Procedure Laterality Date  . CHOLECYSTECTOMY N/A 12/23/2014   Procedure: LAPAROSCOPIC CHOLECYSTECTOMY WITH INTRAOPERATIVE CHOLANGIOGRAM;  Surgeon: Greer Pickerel, MD;  Location: [facility];  Service: General;  Laterality: N/A;  . COLONOSCOPY     "3 times trying to get large polyp since August 2020"  . COLONOSCOPY WITH PROPOFOL N/A 10/20/2018   Procedure: COLONOSCOPY WITH PROPOFOL;  Surgeon: Rush Landmark Telford Nab., MD;  Location: [facility];  Service: Gastroenterology;  Laterality: N/A;  . ENDOSCOPIC MUCOSAL RESECTION N/A  10/20/2018   Procedure: ENDOSCOPIC MUCOSAL RESECTION;  Surgeon: Rush Landmark Telford Nab., MD;  Location: [facility];  Service: Gastroenterology;  Laterality: N/A;  . HEMOSTASIS CLIP PLACEMENT  10/20/2018   Procedure: HEMOSTASIS CLIP PLACEMENT;  Surgeon: Irving Copas., MD;  Location: [facility];  Service: Gastroenterology;;  . POLYPECTOMY  10/20/2018   Procedure: POLYPECTOMY;  Surgeon: Irving Copas., MD;  Location: [facility];  Service: Gastroenterology;;  . Lia Foyer LIFTING INJECTION  10/20/2018   Procedure: SUBMUCOSAL LIFTING  INJECTION;  Surgeon: Irving Copas., MD;  Location: [facility];  Service: Gastroenterology;;  . THORACIC DISCECTOMY  07/2007   T12  . TUBAL LIGATION    . WISDOM TOOTH EXTRACTION     Family History  Problem Relation Age of Onset  . Arthritis Father   . Alcohol abuse Mother        drinker and smoker  . Diabetes Mother   . Heart disease Mother   . Hypertension Mother   . Stroke Mother   . Depression Sister   . Hearing loss Sister   . Hyperlipidemia Sister   . Alcohol abuse Daughter   . Alcohol abuse Son   . Depression Sister   . Early death Sister   . Alcohol abuse Brother   . Depression Sister   . Heart disease Sister   . Colon cancer Neg Hx   . Esophageal cancer Neg Hx   . Inflammatory bowel disease Neg Hx   . Liver disease Neg Hx   . Pancreatic cancer Neg Hx   . Rectal cancer Neg Hx   . Stomach cancer Neg Hx    Social History   Socioeconomic History  . Marital status: Single    Spouse name: Not on file  . Number of children: 4  . Years of education: 76  . Highest education level: Not on file  Occupational History  . Occupation: retired    Comment: Accounts payable and nanny   Tobacco Use  . Smoking status: Never Smoker  . Smokeless tobacco: Never Used  Substance and Sexual Activity  . Alcohol use: No    Alcohol/week: 0.0 standard drinks    Comment: sober for 47 years-noted 09/16/2018  . Drug use: No  . Sexual activity: Never  Other Topics Concern  . Not on file  Social History Narrative   Patient lives alone.   patient has 4 grandchildren.   Patient is retired   Patient has 2 years of college.   Patient is right handed.         Social Determinants of Health   Financial Resource Strain:   . Difficulty of Paying Living Expenses: Not on file  Food Insecurity:   . Worried About Charity fundraiser in the Last Year: Not on file  . Ran Out of Food in the Last Year: Not on file  Transportation Needs:   . Lack of Transportation (Medical): Not  on file  . Lack of Transportation (Non-Medical): Not on file  Physical Activity:   . Days of Exercise per Week: Not on file  . Minutes of Exercise per Session: Not on file  Stress:   . Feeling of Stress : Not on file  Social Connections:   . Frequency of Communication with Friends and Family: Not on file  . Frequency of Social Gatherings with Friends and Family: Not on file  . Attends Religious Services: Not on file  . Active Member of Clubs or Organizations: Not on file  . Attends Club or  Organization Meetings: Not on file  . Marital Status: Not on file    Outpatient Encounter Medications as of 01/19/2019  Medication Sig  . ALPRAZolam (XANAX) 0.25 MG tablet TAKE 1/2 TABLET BY MOUTH EVERY NIGHT AT BEDTIME AS NEEDED FOR ANXIETY.  Marland Kitchen amLODipine (NORVASC) 5 MG tablet TAKE 1 TABLET(5 MG) BY MOUTH DAILY  . Ascorbic Acid (VITAMIN C) 1000 MG tablet Take 1,000 mg by mouth daily.  . Calcium Carbonate-Vitamin D (CALCIUM 600+D PO) Take 1 tablet by mouth daily.  . cetirizine (ZYRTEC) 10 MG tablet Take 10 mg by mouth daily.  . Cholecalciferol (VITAMIN D) 50 MCG (2000 UT) tablet Take 4,000 Units by mouth daily.  . Cyanocobalamin 1500 MCG TBDP Take 1,500 mcg by mouth daily.  . Garlic [id number] MG CAPS Take 500 mg by mouth daily.  . hydrocortisone 2.5 % cream Apply 1 application topically daily as needed (rosacea).   . hydrOXYzine (ATARAX/VISTARIL) 10 MG tablet Take 10 mg by mouth at bedtime.   Marland Kitchen ibuprofen (ADVIL) 200 MG tablet Take 200 mg by mouth every 6 (six) hours as needed for headache or moderate pain.  Marland Kitchen ketotifen (ZADITOR) 0.025 % ophthalmic solution Place 1 drop into both eyes 2 (two) times daily as needed (itchy eyes).  . LYSINE PO Take 1 tablet by mouth daily.  . Multiple Minerals-Vitamins (CAL MAG ZINC +D3) TABS Take 1 tablet by mouth daily.  . Multiple Vitamins-Minerals (MULTIVITAMIN WITH MINERALS) tablet Take 1 tablet by mouth daily.  . Multiple Vitamins-Minerals (ZINC PO) Take 1 tablet by  mouth daily. With vitamin b6  . Omega-3 Fatty Acids (FISH OIL) 1000 MG CAPS Take 1,000 mg by mouth daily.  Marland Kitchen OVER THE COUNTER MEDICATION Take 1 capsule by mouth daily. Aller-max otc supplement  . OVER THE COUNTER MEDICATION Take 1 capsule by mouth daily. "e omega" over the counter supplement  . OVER THE COUNTER MEDICATION Take 1 capsule by mouth daily. "microplex VM complex"  . OVER THE COUNTER MEDICATION Take 1 tablet by mouth daily. alpha crs plus  . OVER THE COUNTER MEDICATION Take 1 tablet by mouth daily. Cholesterol rice  . Oxcarbazepine (TRILEPTAL) 300 MG tablet TAKE 1 TABLET(300 MG) BY MOUTH TWICE DAILY (Patient taking differently: Take 300 mg by mouth 2 (two) times daily. )  . Potassium 99 MG TABS Take 99 mg by mouth daily.  . Probiotic CAPS Take 1 capsule by mouth daily.  . Red Yeast Rice Extract (RED YEAST RICE PO) Take 1 tablet by mouth daily.   . Turmeric 500 MG CAPS Take 500 mg by mouth daily.  . valACYclovir (VALTREX) 500 MG tablet TAKE 1 TO 2 TABLETS BY MOUTH AS NEEDED FOR COLD SORE OUTBREAK (Patient taking differently: Take 500 mg by mouth daily as needed (cold sores). )   No facility-administered encounter medications on file as of 01/19/2019.    Activities of Daily Living In your present state of health, do you have any difficulty performing the following activities: 01/19/2019  Hearing? N  Vision? N  Difficulty concentrating or making decisions? N  Walking or climbing stairs? N  Dressing or bathing? N  Doing errands, shopping? N  Preparing Food and eating ? N  Using the Toilet? N  In the past six months, have you accidently leaked urine? N  Do you have problems with loss of bowel control? N  Managing your Medications? N  Managing your Finances? N  Housekeeping or managing your Housekeeping? N  Some recent data might be hidden  Patient Care Team: Vivi Barrack, MD as PCP - General (Family Medicine) Marilynne Halsted, MD as Referring Physician  (Ophthalmology) Laurence Spates, MD as Consulting Physician (Gastroenterology) Warren Danes, PA-C as Physician Assistant (Dermatology) Cameron Sprang, MD as Consulting Physician (Neurology) Edrick Kins, DPM as Consulting Physician (Podiatry)    Assessment:   This is a routine wellness examination for [place].  Exercise Activities and Dietary recommendations Current Exercise Habits: Home exercise routine, Type of exercise: walking;Other - see comments(boxing), Time (Minutes): 45, Frequency (Times/Week): 5, Weekly Exercise (Minutes/Week): 225, Intensity: Moderate  Goals   None     Fall Risk Fall Risk  01/19/2019 09/01/2018 12/03/2017 02/12/2017 11/27/2016  Falls in the past year? 0 0 0 No No  Number falls in past yr: - 0 - - -  Injury with Fall? 0 - - - -  Follow up Falls evaluation completed;Education provided;Falls prevention discussed Falls evaluation completed - - -   Is the patient's home free of loose throw rugs in walkways, pet beds, electrical cords, etc?   yes      Grab bars in the bathroom? yes      Handrails on the stairs?   yes      Adequate lighting?   yes  Timed Get Up and Go performed: completed and within normal timeframe; no gait abnormalities noted   Depression Screen PHQ 2/9 Scores 01/19/2019 12/03/2017 11/27/2016 09/19/2016  PHQ - 2 Score 0 0 0 0  PHQ- 9 Score - 0 0 -     Cognitive Function- no cognitive concerns at this time  Cognitive Testing  Alert? Yes         Normal Appearance? Yes  Oriented to person? Yes           Place? Yes  Time? Yes  Recall of three objects? Yes  Can perform simple calculations? Yes  Displays appropriate judgment? Yes  Can read the correct time from a watch face? Yes   Immunization History  Administered Date(s) Administered  . Influenza-Unspecified 10/22/2017  . Pneumococcal Conjugate-13 05/16/2016  . Pneumococcal Polysaccharide-23 06/25/2013  . Tdap 10/22/2017  . Zoster Recombinat (Shingrix) 03/24/2017, 07/23/2017     Qualifies for Shingles Vaccine? Shingrix completed   Screening Tests Health Maintenance  Topic Date Due  . INFLUENZA VACCINE  04/08/2019 (Originally 08/09/2018)  . PNA vac Low Risk Adult  Completed  . DEXA SCAN  Discontinued  . TETANUS/TDAP  Discontinued    Cancer Screenings: Lung: Low Dose CT Chest recommended if Age 75-80 years, 30 pack-year currently smoking OR have quit w/in 15years. Patient does not qualify. Breast:  Up to date on Mammogram? Yes   Up to date of Bone Density/Dexa? Yes Colorectal: colonoscopy 10/20/18 with Dr. Rush Landmark     Plan:  I have personally reviewed and addressed the Medicare Annual Wellness questionnaire and have noted the following in the patient's chart:  A. Medical and social history B. Use of alcohol, tobacco or illicit drugs  C. Current medications and supplements D. Functional ability and status E.  Nutritional status F.  Physical activity G. Advance directives H. List of other physicians I.  Hospitalizations, surgeries, and ER visits in previous 12 months J.  [place] such as hearing and vision if needed, cognitive and depression L. Referrals, records requested, and appointments- none   In addition, I have reviewed and discussed with patient certain preventive protocols, quality metrics, and best practice recommendations. A written personalized care plan for preventive services as well as  general preventive health recommendations were provided to patient.   Signed,  Denman George, LPN  Nurse Health Advisor   Nurse Notes: no additional

## 2019-02-09 ENCOUNTER — Emergency Department (HOSPITAL_COMMUNITY): Payer: Medicare Other

## 2019-02-09 ENCOUNTER — Other Ambulatory Visit: Payer: Self-pay

## 2019-02-09 ENCOUNTER — Emergency Department (HOSPITAL_COMMUNITY)
Admission: EM | Admit: 2019-02-09 | Discharge: 2019-02-09 | Disposition: A | Discharge status: Home / Self Care | Payer: Medicare Other | Attending: Emergency Medicine | Admitting: Emergency Medicine

## 2019-02-09 ENCOUNTER — Encounter (HOSPITAL_COMMUNITY): Payer: Self-pay | Admitting: Emergency Medicine

## 2019-02-09 DIAGNOSIS — R52 Pain, unspecified: Secondary | ICD-10-CM | POA: Diagnosis not present

## 2019-02-09 DIAGNOSIS — Y999 Unspecified external cause status: Secondary | ICD-10-CM | POA: Insufficient documentation

## 2019-02-09 DIAGNOSIS — Y939 Activity, unspecified: Secondary | ICD-10-CM | POA: Insufficient documentation

## 2019-02-09 DIAGNOSIS — Y929 Unspecified place or not applicable: Secondary | ICD-10-CM | POA: Diagnosis not present

## 2019-02-09 DIAGNOSIS — I10 Essential (primary) hypertension: Secondary | ICD-10-CM | POA: Insufficient documentation

## 2019-02-09 DIAGNOSIS — S82842A Displaced bimalleolar fracture of left lower leg, initial encounter for closed fracture: Secondary | ICD-10-CM | POA: Insufficient documentation

## 2019-02-09 DIAGNOSIS — W19XXXA Unspecified fall, initial encounter: Secondary | ICD-10-CM | POA: Diagnosis not present

## 2019-02-09 DIAGNOSIS — R609 Edema, unspecified: Secondary | ICD-10-CM | POA: Diagnosis not present

## 2019-02-09 DIAGNOSIS — S82302A Unspecified fracture of lower end of left tibia, initial encounter for closed fracture: Secondary | ICD-10-CM | POA: Diagnosis not present

## 2019-02-09 DIAGNOSIS — S82892A Other fracture of left lower leg, initial encounter for closed fracture: Secondary | ICD-10-CM

## 2019-02-09 DIAGNOSIS — S82492A Other fracture of shaft of left fibula, initial encounter for closed fracture: Secondary | ICD-10-CM | POA: Diagnosis not present

## 2019-02-09 DIAGNOSIS — Z4789 Encounter for other orthopedic aftercare: Secondary | ICD-10-CM | POA: Diagnosis not present

## 2019-02-09 DIAGNOSIS — S8992XA Unspecified injury of left lower leg, initial encounter: Secondary | ICD-10-CM | POA: Diagnosis present

## 2019-02-09 DIAGNOSIS — S8252XA Displaced fracture of medial malleolus of left tibia, initial encounter for closed fracture: Secondary | ICD-10-CM | POA: Diagnosis not present

## 2019-02-09 DIAGNOSIS — Z743 Need for continuous supervision: Secondary | ICD-10-CM | POA: Diagnosis not present

## 2019-02-09 DIAGNOSIS — S82845A Nondisplaced bimalleolar fracture of left lower leg, initial encounter for closed fracture: Secondary | ICD-10-CM | POA: Diagnosis not present

## 2019-02-09 DIAGNOSIS — W009XXA Unspecified fall due to ice and snow, initial encounter: Secondary | ICD-10-CM | POA: Insufficient documentation

## 2019-02-09 DIAGNOSIS — Z79899 Other long term (current) drug therapy: Secondary | ICD-10-CM | POA: Diagnosis not present

## 2019-02-09 DIAGNOSIS — S82832A Other fracture of upper and lower end of left fibula, initial encounter for closed fracture: Secondary | ICD-10-CM | POA: Diagnosis not present

## 2019-02-09 MED ORDER — PROPOFOL 10 MG/ML IV BOLUS
1.0000 mg/kg | Freq: Once | INTRAVENOUS | Status: DC
  Filled 2019-02-09: qty 20

## 2019-02-09 MED ORDER — FENTANYL CITRATE (PF) 100 MCG/2ML IJ SOLN
50.0000 ug | Freq: Once | INTRAMUSCULAR | Status: AC
  Administered 2019-02-09: 50 ug via INTRAVENOUS
  Filled 2019-02-09: qty 2

## 2019-02-09 MED ORDER — ACETAMINOPHEN 500 MG PO TABS: 500.0000 mg | ORAL_TABLET | Freq: Four times a day (QID) | ORAL | 0 refills | Status: DC | PRN

## 2019-02-09 MED ORDER — HYDROCODONE-ACETAMINOPHEN 5-325 MG PO TABS: 1.0000 | ORAL_TABLET | ORAL | 0 refills | Status: DC | PRN

## 2019-02-09 MED ORDER — PROPOFOL 10 MG/ML IV BOLUS
INTRAVENOUS | Status: AC | PRN
  Administered 2019-02-09 (×2): 30 mg via INTRAVENOUS
  Administered 2019-02-09: 31 mg via INTRAVENOUS
  Administered 2019-02-09: 35 mg via INTRAVENOUS

## 2019-02-09 MED ORDER — HYDROCODONE-ACETAMINOPHEN 5-325 MG PO TABS: 1.0000 | ORAL_TABLET | Freq: Once | ORAL | Status: DC

## 2019-02-09 NOTE — Procedures (Signed)
Procedure: Left ankle closed reduction  Indication: Left ankle fracture  Surgeon: Silvestre Gunner, PA-C  Assist: None  Anesthesia: Propofol for CS via EDP  EBL: None  Complications: None  Findings: After risks/benefits explained patient desires to undergo procedure. Consent obtained and time out performed. Sedation given and confirmed. Left ankle reduced and splinted. Pt tolerated the procedure well. X-rays pending.    Lisette Abu, PA-C Orthopedic Surgery [phone]

## 2019-02-09 NOTE — ED Provider Notes (Signed)
.  Sedation  Date/Time: 02/09/2019 2:33 PM Performed by: Hayden Rasmussen, MD Authorized by: Hayden Rasmussen, MD   Consent:    Consent obtained:  Verbal and written   Consent given by:  Patient   Risks discussed:  Allergic reaction, dysrhythmia, inadequate sedation, nausea, prolonged hypoxia resulting in organ damage, prolonged sedation necessitating reversal, respiratory compromise necessitating ventilatory assistance and intubation and vomiting   Alternatives discussed:  Analgesia without sedation, anxiolysis and regional anesthesia Universal protocol:    Procedure explained and questions answered to patient or proxy's satisfaction: yes     Relevant documents present and verified: yes     Test results available and properly labeled: yes     Imaging studies available: yes     Required blood products, implants, devices, and special equipment available: yes     Site/side marked: yes     Immediately prior to procedure a time out was called: yes     Patient identity confirmation method:  Verbally with patient and arm band Indications:    Procedure performed:  Fracture reduction   Procedure necessitating sedation performed by:  Different physician Pre-sedation assessment:    Time since last food or drink:  6 hours   ASA classification: class 2 - patient with mild systemic disease     Neck mobility: normal     Mouth opening:  3 or more finger widths   Thyromental distance:  4 finger widths   Mallampati score:  I - soft palate, uvula, fauces, pillars visible   Pre-sedation assessments completed and reviewed: airway patency, cardiovascular function, hydration status, mental status, nausea/vomiting, pain level, respiratory function and temperature     Pre-sedation assessment completed:  02/09/2019 2:10 PM Immediate pre-procedure details:    Reassessment: Patient reassessed immediately prior to procedure     Reviewed: vital signs, relevant labs/tests and NPO status     Verified: bag valve mask  available, emergency equipment available, intubation equipment available, IV patency confirmed, oxygen available and suction available   Procedure details (see MAR for exact dosages):    Preoxygenation:  Nasal cannula   Sedation:  Propofol   Intended level of sedation: deep   Intra-procedure monitoring:  Blood pressure monitoring, cardiac monitor, continuous pulse oximetry, frequent LOC assessments, frequent vital sign checks and continuous capnometry   Intra-procedure events: none     Total Provider sedation time (minutes):  15 Post-procedure details:    Post-sedation assessment completed:  02/09/2019 3:15 PM   Attendance: Constant attendance by certified staff until patient recovered     Recovery: Patient returned to pre-procedure baseline     Post-sedation assessments completed and reviewed: airway patency, cardiovascular function, hydration status, mental status, nausea/vomiting, pain level, respiratory function and temperature     Patient is stable for discharge or admission: yes     Patient tolerance:  Tolerated well, no immediate complications      Hayden Rasmussen, MD 02/09/19 1729

## 2019-02-09 NOTE — Progress Notes (Signed)
Respiratory stand by for concourse sedation. No noted respiratory issues at this time.

## 2019-02-09 NOTE — ED Provider Notes (Signed)
[facility] EMERGENCY DEPARTMENT Provider Note   CSN: [id number] Arrival date & time: 02/09/19  1037     History Chief Complaint  Patient presents with  . Fall  . Ankle Injury    Joan Mclaughlin is a 79 y.o. female with no relevant PMH who presents to the ED via EMS after sustaining mechanical fall.  Patient reports that she slipped on the ice and does not recall the precise mechanism however injured her left ankle.  She attempted to get up and ambulate, but was unable to do so due to the significant pain and discomfort.  Currently, her pain is 7 out of 10.  There is notable deformity to her left ankle.  She denies any head injury, loss of consciousness, headache, back pain, blood thinners, preceding presyncopal prodrome, evidence of seizure activity, chest pain or difficulty breathing, dizziness, numbness, or other neurologic symptoms.    HPI     Past Medical History:  Diagnosis Date  . Alcoholism ([place])    sober for 47 years-noted 09/16/2018  . Allergy   . Anxiety   . Cholecystitis   . Complication of anesthesia   . Difficulty sleeping    takes med to sleep  . Diverticulitis   . Family history of adverse reaction to anesthesia    SIster - N/V  . Gallstones   . Ganglion cyst 12/11/2016  . GERD (gastroesophageal reflux disease)    "RELATED TO GALLBLADDER"  . Heart murmur    "ONLY DETECTED WHEN LYING DOWN"  - 10/16/2018- Not concerned with  . History of Lyme disease 09/19/2016  . Hyperlipidemia   . Hypertension   . Lyme disease    Pt states she has had Lyme Disease twice.   . Nocturia   . PONV (postoperative nausea and vomiting)    Nausea  . S/P laparoscopic cholecystectomy 12/23/2014  . Seizures ([place])   . Seizures ([place])    "last one 10- 15 years ago - 10/16/2018  . Tubular adenoma of colon 06/27/2018    Patient Active Problem List   Diagnosis Date Noted  . Essential hypertension 10/14/2018  . Abnormal colonoscopy 09/18/2018  . History of  colonic polyps 09/16/2018  . Tubular adenoma of colon 06/27/2018  . Dyslipidemia 03/06/2018  . Allergic rhinitis 03/05/2018  . History of cold sores 03/05/2018  . 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 Calculus of gallbladder without cholecystitis without obstruction 01/14/2017  . Constipation 01/14/2017  . Localization-related idiopathic epilepsy and epileptic syndromes with seizures of localized onset, not intractable, without status epilepticus ([place]) 03/02/2015  . Insomnia 05/02/2013    Past Surgical History:  Procedure Laterality Date  . CHOLECYSTECTOMY N/A 12/23/2014   Procedure: LAPAROSCOPIC CHOLECYSTECTOMY WITH INTRAOPERATIVE CHOLANGIOGRAM;  Surgeon: Greer Pickerel, MD;  Location: [facility];  Service: General;  Laterality: N/A;  . COLONOSCOPY     "3 times trying to get large polyp since August 2020"  . COLONOSCOPY WITH PROPOFOL N/A 10/20/2018   Procedure: COLONOSCOPY WITH PROPOFOL;  Surgeon: Rush Landmark Telford Nab., MD;  Location: [facility];  Service: Gastroenterology;  Laterality: N/A;  . ENDOSCOPIC MUCOSAL RESECTION N/A 10/20/2018   Procedure: ENDOSCOPIC MUCOSAL RESECTION;  Surgeon: Rush Landmark Telford Nab., MD;  Location: [facility];  Service: Gastroenterology;  Laterality: N/A;  . HEMOSTASIS CLIP PLACEMENT  10/20/2018   Procedure: HEMOSTASIS CLIP PLACEMENT;  Surgeon: Irving Copas., MD;  Location: [facility];  Service: Gastroenterology;;  . POLYPECTOMY  10/20/2018   Procedure: POLYPECTOMY;  Surgeon: Irving Copas., MD;  [facility];  Service: Gastroenterology;;  . Lia Foyer LIFTING INJECTION  10/20/2018   Procedure: SUBMUCOSAL LIFTING INJECTION;  Surgeon: Irving Copas., MD;  Location: [facility];  Service: Gastroenterology;;  . THORACIC DISCECTOMY  07/2007   T12  . TUBAL LIGATION    . WISDOM TOOTH EXTRACTION       OB History   No obstetric history on file.     Family History  Problem Relation Age of Onset  .  Arthritis Father   . Alcohol abuse Mother        drinker and smoker  . Diabetes Mother   . Heart disease Mother   . Hypertension Mother   . Stroke Mother   . Depression Sister   . Hearing loss Sister   . Hyperlipidemia Sister   . Alcohol abuse Daughter   . Alcohol abuse Son   . Depression Sister   . Early death Sister   . Alcohol abuse Brother   . Depression Sister   . Heart disease Sister   . Colon cancer Neg Hx   . Esophageal cancer Neg Hx   . Inflammatory bowel disease Neg Hx   . Liver disease Neg Hx   . Pancreatic cancer Neg Hx   . Rectal cancer Neg Hx   . Stomach cancer Neg Hx     Social History   Tobacco Use  . Smoking status: Never Smoker  . Smokeless tobacco: Never Used  Substance Use Topics  . Alcohol use: No    Alcohol/week: 0.0 standard drinks    Comment: sober for 47 years-noted 09/16/2018  . Drug use: No    Home Medications Prior to Admission medications   Medication Sig Start Date End Date Taking? Authorizing Provider  acetaminophen (TYLENOL) 500 MG tablet Take 1 tablet (500 mg total) by mouth every 6 (six) hours as needed for moderate pain. 02/09/19   Corena Herter, PA-C  ALPRAZolam (XANAX) 0.25 MG tablet TAKE 1/2 TABLET BY MOUTH EVERY NIGHT AT BEDTIME AS NEEDED FOR ANXIETY. 11/11/18   Vivi Barrack, MD  amLODipine (NORVASC) 5 MG tablet TAKE 1 TABLET(5 MG) BY MOUTH DAILY 01/14/19   Marin Olp, MD  Ascorbic Acid (VITAMIN C) 1000 MG tablet Take 1,000 mg by mouth daily.    [provider]  Calcium Carbonate-Vitamin D (CALCIUM 600+D PO) Take 1 tablet by mouth daily.    [provider]  cetirizine (ZYRTEC) 10 MG tablet Take 10 mg by mouth daily.    [provider]  Cholecalciferol (VITAMIN D) 50 MCG (2000 UT) tablet Take 4,000 Units by mouth daily.    [provider]  Cyanocobalamin 1500 MCG TBDP Take 1,500 mcg by mouth daily.    [provider]  Garlic XX123456 MG CAPS Take 500 mg by mouth daily.    [provider]  HYDROcodone-acetaminophen (NORCO/VICODIN) 5-325 MG tablet Take 1 tablet by mouth every 4 (four) hours as needed for severe pain. 02/09/19   Corena Herter, PA-C  hydrocortisone 2.5 % cream Apply 1 application topically daily as needed (rosacea).  09/29/18   [provider]  hydrOXYzine (ATARAX/VISTARIL) 10 MG tablet Take 10 mg by mouth at bedtime.     [provider]  ibuprofen (ADVIL) 200 MG tablet Take 200 mg by mouth every 6 (six) hours as needed for headache or moderate pain.    [provider]  ketotifen (ZADITOR) 0.025 % ophthalmic solution Place 1 drop into both eyes 2 (two) times daily as  needed (itchy eyes).    [provider]  LYSINE PO Take 1 tablet by mouth daily.    [provider]  Multiple Minerals-Vitamins (CAL MAG ZINC +D3) TABS Take 1 tablet by mouth daily.    [provider]  Multiple Vitamins-Minerals (MULTIVITAMIN WITH MINERALS) tablet Take 1 tablet by mouth daily.    [provider]  Multiple Vitamins-Minerals (ZINC PO) Take 1 tablet by mouth daily. With vitamin b6    [provider]  Omega-3 Fatty Acids (FISH OIL) 1000 MG CAPS Take 1,000 mg by mouth daily.    [provider]  OVER THE COUNTER MEDICATION Take 1 capsule by mouth daily. Aller-max otc supplement    [provider]  OVER THE COUNTER MEDICATION Take 1 capsule by mouth daily. "e omega" over the counter supplement    [provider]  OVER THE COUNTER MEDICATION Take 1 capsule by mouth daily. "microplex VM complex"    [provider]  OVER THE COUNTER MEDICATION Take 1 tablet by mouth daily. alpha crs plus    [provider]  OVER THE COUNTER MEDICATION Take 1 tablet by mouth daily. Cholesterol rice    [provider]  Oxcarbazepine (TRILEPTAL) 300 MG tablet TAKE 1 TABLET(300 MG) BY MOUTH TWICE DAILY Patient taking differently: Take 300 mg by mouth 2 (two) times daily.  09/01/18    Cameron Sprang, MD  Potassium 99 MG TABS Take 99 mg by mouth daily.    [provider]  Probiotic CAPS Take 1 capsule by mouth daily.    [provider]  Red Yeast Rice Extract (RED YEAST RICE PO) Take 1 tablet by mouth daily.     [provider]  Turmeric 500 MG CAPS Take 500 mg by mouth daily.    [provider]  valACYclovir (VALTREX) 500 MG tablet TAKE 1 TO 2 TABLETS BY MOUTH AS NEEDED FOR COLD SORE OUTBREAK Patient taking differently: Take 500 mg by mouth daily as needed (cold sores).  06/23/18   Vivi Barrack, MD    Allergies    Amoxicillin-pot clavulanate and Suvorexant  Review of Systems   Review of Systems  Musculoskeletal: Positive for arthralgias, gait problem and joint swelling. Negative for back pain.  Skin: Positive for color change.  Neurological: Negative for weakness and numbness.    Physical Exam Updated Vital Signs BP (!) 134/95   Pulse 84   Temp (!) 96.7 F (35.9 C) (Tympanic)   Resp 17   Ht 5\' 2"  (1.575 m)   Wt 66.7 kg   SpO2 100%   BMI 26.89 kg/m   Physical Exam Vitals and nursing note reviewed. Exam conducted with a chaperone present.  Constitutional:      Appearance: Normal appearance.  HENT:     Head: Normocephalic and atraumatic.  Eyes:     General: No scleral icterus.    Conjunctiva/sclera: Conjunctivae normal.  Cardiovascular:     Rate and Rhythm: Normal rate and regular rhythm.     Pulses: Normal pulses.     Heart sounds: Normal heart sounds.  Pulmonary:     Effort: Pulmonary effort is normal. No respiratory distress.     Breath sounds: Normal breath sounds.  Musculoskeletal:     Cervical back: Normal range of motion and neck supple. No rigidity.     Comments: Left ankle: Obvious deformity.  Significant swelling and ecchymoses.  Significant tenderness to palpation over medial and lateral malleoli.  Pedal pulse intact.  Sensation intact  throughout. Left knee: No tenderness to palpation.  ROM intact.   Skin:    General: Skin is dry.     Capillary Refill: Capillary refill takes less than 2 seconds.     Findings: Bruising present.  Neurological:     General: No focal deficit present.     Mental Status: She is alert.     GCS: GCS eye subscore is 4. GCS verbal subscore is 5. GCS motor subscore is 6.     Cranial Nerves: No cranial nerve deficit.     Sensory: No sensory deficit.  Psychiatric:        Mood and Affect: Mood normal.        Behavior: Behavior normal.        Thought Content: Thought content normal.     ED Results / Procedures / Treatments   Labs (all labs ordered are listed, but only abnormal results are displayed) Labs Reviewed - No data to display  EKG None  Radiology DG Ankle Complete Left  Result Date: 02/09/2019 CLINICAL DATA:  Status post reduction EXAM: LEFT ANKLE COMPLETE - 3+ VIEW COMPARISON:  February 09, 2019 11:10 a.m. FINDINGS: Previous noted fracture/dislocation of the distal tibia and fibula are better aligned on the post reduction films. Soft tissue and osseous details are partially obscured by overlying cast. IMPRESSION: Previous noted fracture/dislocation of the distal tibia and fibula are better aligned on the post reduction films. Electronically Signed   By: Abelardo Diesel M.D.   On: 02/09/2019 14:57   DG Ankle Complete Left  Result Date: 02/09/2019 CLINICAL DATA:  Fall on ice with left ankle deformity EXAM: LEFT ANKLE COMPLETE - 3+ VIEW COMPARISON:  None. FINDINGS: Distal fibular and medial malleolus fractures from eversion injury with displacement causing malalignment due to lateral displacement ankle mortise widening. Regional soft tissue swelling. Located hindfoot. IMPRESSION: Displaced distal fibular and medial malleolus fractures with malaligned ankle Electronically Signed   By: Monte Fantasia M.D.   On: 02/09/2019 11:21    Procedures Procedures (including critical care time)  Medications Ordered in ED Medications  propofol (DIPRIVAN) 10 mg/mL  bolus/IV push 66.7 mg ( Intravenous Canceled Entry 02/09/19 1434)  fentaNYL (SUBLIMAZE) injection 50 mcg (50 mcg Intravenous Given 02/09/19 1137)  fentaNYL (SUBLIMAZE) injection 50 mcg (50 mcg Intravenous Given 02/09/19 1255)  propofol (DIPRIVAN) 10 mg/mL bolus/IV push (30 mg Intravenous Given 02/09/19 1425)    ED Course  I have reviewed the triage vital signs and the nursing notes.  Pertinent labs & imaging results that were available during my care of the patient were reviewed by me and considered in my medical decision making (see chart for details).  Clinical Course as of Feb 08 1553  Mon Feb 09, 2019  1143 Pleasant 79 year old female had a mechanical slip and fall on the ice injuring her left ankle.  She is got swelling and deformity.  Distal neurovascular intact.  Getting x-rays and orthopedics consult.   [MB]    Clinical Course User Index [MB] Hayden Rasmussen, MD   MDM Rules/Calculators/A&P                      DG complete demonstrates displaced distal fibular and medial malleolus fractures with a malaligned ankle.  Will consult orthopedics, Silvestre Gunner, PA-C, to personally assess patient.  50 mcg fentanyl x 2 and ice provided here in the ED for her pain and discomfort.  Silvestre Gunner PA-C is en route and should be here to reduce  patient's ankle injury shortly.    3:55 PM Silvestre Gunner PA-C is now prepared to reduce patient's ankle.  Will move her to a room and provide the conscious sedation necessary for reduction procedure.  She will follow-up with Dr. Lucia Gaskins outpatient with plans for ORIF.  Legrand Como asks that patient be non-weightbearing so she would require walker and be able to ambulate on one foot. Not a candidate for wheelchair.  Repeat plain films demonstrates improved alignment.  Consulted transition of care team to acquire walker for the patient.  Plan is for her to be discharged home with a walker so long as she can ambulate without any weightbearing on left foot.  She  was able to ambulate with walker without issue.  We will issue one here in the ED to take home.  Return precautions discussed.  All of the evaluation and work-up results were discussed with the patient and any family at bedside. They were provided opportunity to ask any additional questions and have none at this time. They have expressed understanding of verbal discharge instructions as well as return precautions and are agreeable to the plan.    Final Clinical Impression(s) / ED Diagnoses Final diagnoses:  Closed bimalleolar fracture of left ankle, initial encounter    Rx / DC Orders ED Discharge Orders         Ordered    HYDROcodone-acetaminophen (NORCO/VICODIN) 5-325 MG tablet  Every 4 hours PRN     02/09/19 1553    acetaminophen (TYLENOL) 500 MG tablet  Every 6 hours PRN     02/09/19 1553           Corena Herter, PA-C 02/09/19 1555    Hayden Rasmussen, MD 02/09/19 1825

## 2019-02-09 NOTE — Discharge Planning (Signed)
Fuller Mandril, RN, BSN, [place] [phone] Pt qualifies for DME rolling walker.  DME  ordered through [employer].  Zack of [facility] notified to deliver rolling walker to pt room prior to D/C home.

## 2019-02-09 NOTE — Discharge Instructions (Addendum)
Keep splint intact and dry.  Do not put weight down on left foot.  Ice (over splint) for 30 minutes 4x/day  Please take the Vicodin and Tylenol, as prescribed.  Please do not take either more frequently than prescribed or else will exceed max Tylenol amount each day.  Take the Tylenol first and then the Vicodin if you have breakthrough pain.  You were given narcotic and or sedative medications while in the emergency department. Do not drive. Do not use machinery or power tools. Do not sign legal documents. Do not drink alcohol. Do not take sleeping pills. Do not supervise children by yourself. Do not participate in activities that require climbing or being in high places.

## 2019-02-09 NOTE — ED Triage Notes (Signed)
Pt here from home where she slipped on some black ice, landing on her buttocks, injuring L ankle. Obvious deformity and swelling noted. Pt did not pass out, did not hit head.

## 2019-02-09 NOTE — Consult Note (Signed)
Reason for Consult:Left ankle fx Referring Physician: [name] is an 79 y.o. female.  HPI: Joan Mclaughlin was walking down some stairs and slipped on some ice. She fell and landed on her bottom but twisted her ankle severely in the process. She had immediate pain and could not get up or bear weight. She was brought to the ED where x-rays showed a left ankle fx and orthopedic surgery was consulted.   Past Medical History:  Diagnosis Date  . Alcoholism ([place])    sober for 47 years-noted 09/16/2018  . Allergy   . Anxiety   . Cholecystitis   . Complication of anesthesia   . Difficulty sleeping    takes med to sleep  . Diverticulitis   . Family history of adverse reaction to anesthesia    SIster - N/V  . Gallstones   . Ganglion cyst 12/11/2016  . GERD (gastroesophageal reflux disease)    "RELATED TO GALLBLADDER"  . Heart murmur    "ONLY DETECTED WHEN LYING DOWN"  - 10/16/2018- Not concerned with  . History of Lyme disease 09/19/2016  . Hyperlipidemia   . Hypertension   . Lyme disease    Pt states she has had Lyme Disease twice.   . Nocturia   . PONV (postoperative nausea and vomiting)    Nausea  . S/P laparoscopic cholecystectomy 12/23/2014  . Seizures ([place])   . Seizures ([place])    "last one 10- 15 years ago - 10/16/2018  . Tubular adenoma of colon 06/27/2018    Past Surgical History:  Procedure Laterality Date  . CHOLECYSTECTOMY N/A 12/23/2014   Procedure: LAPAROSCOPIC CHOLECYSTECTOMY WITH INTRAOPERATIVE CHOLANGIOGRAM;  Surgeon: Greer Pickerel, MD;  [facility];  Service: General;  Laterality: N/A;  . COLONOSCOPY     "3 times trying to get large polyp since August 2020"  . COLONOSCOPY WITH PROPOFOL N/A 10/20/2018   Procedure: COLONOSCOPY WITH PROPOFOL;  Surgeon: Rush Landmark Telford Nab., MD;  Location: [facility];  Service: Gastroenterology;  Laterality: N/A;  . ENDOSCOPIC MUCOSAL RESECTION N/A 10/20/2018   Procedure: ENDOSCOPIC MUCOSAL RESECTION;  Surgeon:  Rush Landmark Telford Nab., MD;  Location: [facility];  Service: Gastroenterology;  Laterality: N/A;  . HEMOSTASIS CLIP PLACEMENT  10/20/2018   Procedure: HEMOSTASIS CLIP PLACEMENT;  Surgeon: Irving Copas., MD;  Location: [facility];  Service: Gastroenterology;;  . POLYPECTOMY  10/20/2018   Procedure: POLYPECTOMY;  Surgeon: Irving Copas., MD;  Location: [facility];  Service: Gastroenterology;;  . Lia Foyer LIFTING INJECTION  10/20/2018   Procedure: SUBMUCOSAL LIFTING INJECTION;  Surgeon: Irving Copas., MD;  Location: [facility];  Service: Gastroenterology;;  . THORACIC DISCECTOMY  07/2007   T12  . TUBAL LIGATION    . WISDOM TOOTH EXTRACTION      Family History  Problem Relation Age of Onset  . Arthritis Father   . Alcohol abuse Mother        drinker and smoker  . Diabetes Mother   . Heart disease Mother   . Hypertension Mother   . Stroke Mother   . Depression Sister   . Hearing loss Sister   . Hyperlipidemia Sister   . Alcohol abuse Daughter   . Alcohol abuse Son   . Depression Sister   . Early death Sister   . Alcohol abuse Brother   . Depression Sister   . Heart disease Sister   . Colon cancer Neg Hx   . Esophageal cancer Neg Hx   . Inflammatory bowel disease Neg  Hx   . Liver disease Neg Hx   . Pancreatic cancer Neg Hx   . Rectal cancer Neg Hx   . Stomach cancer Neg Hx     Social History:  reports that she has never smoked. She has never used smokeless tobacco. She reports that she does not drink alcohol or use drugs.  Allergies:  Allergies  Allergen Reactions  . Amoxicillin-Pot Clavulanate Nausea And Vomiting    Did it involve swelling of the face/tongue/throat, SOB, or low BP? No Did it involve sudden or severe rash/hives, skin peeling, or any reaction on the inside of your mouth or nose? No Did you need to seek medical attention at a hospital or doctor's office? No When did it last happen?in 2016 If all above answers  are "NO", may proceed with cephalosporin use.   . Suvorexant Other (See Comments)    Nightmares     Medications: I have reviewed the patient's current medications.  No results found for this or any previous visit (from the past 48 hour(s)).  DG Ankle Complete Left  Result Date: 02/09/2019 CLINICAL DATA:  Fall on ice with left ankle deformity EXAM: LEFT ANKLE COMPLETE - 3+ VIEW COMPARISON:  None. FINDINGS: Distal fibular and medial malleolus fractures from eversion injury with displacement causing malalignment due to lateral displacement ankle mortise widening. Regional soft tissue swelling. Located hindfoot. IMPRESSION: Displaced distal fibular and medial malleolus fractures with malaligned ankle Electronically Signed   By: Monte Fantasia M.D.   On: 02/09/2019 11:21    Review of Systems  HENT: Negative for ear discharge, ear pain, hearing loss and tinnitus.   Eyes: Negative for photophobia and pain.  Respiratory: Negative for cough and shortness of breath.   Cardiovascular: Negative for chest pain.  Gastrointestinal: Negative for abdominal pain, nausea and vomiting.  Genitourinary: Negative for dysuria, flank pain, frequency and urgency.  Musculoskeletal: Positive for arthralgias (Left ankle). Negative for back pain, myalgias and neck pain.  Neurological: Negative for dizziness and headaches.  Hematological: Does not bruise/bleed easily.  Psychiatric/Behavioral: The patient is not nervous/anxious.    Blood pressure (!) 161/79, pulse 73, temperature (!) 96.7 F (35.9 C), temperature source Tympanic, resp. rate 16, height 5\' 2"  (1.575 m), weight 66.7 kg, SpO2 100 %. Physical Exam  Constitutional: She appears well-developed and well-nourished. No distress.  HENT:  Head: Normocephalic and atraumatic.  Eyes: Conjunctivae are normal. Right eye exhibits no discharge. Left eye exhibits no discharge. No scleral icterus.  Cardiovascular: Normal rate and regular rhythm.  Respiratory: Effort  normal. No respiratory distress.  Musculoskeletal:     Cervical back: Normal range of motion.     Comments: LLE No traumatic wounds or rash  Mod TTP ankle, mod edema and medial ecchymosis  No knee effusion  Knee stable to varus/ valgus and anterior/posterior stress  Sens DPN, SPN, TN intact  Motor EHL, ext, flex, evers 5/5  DP 2+, PT 0, No significant edema  Neurological: She is alert.  Skin: Skin is warm and dry. She is not diaphoretic.  Psychiatric: She has a normal mood and affect. Her behavior is normal.    Assessment/Plan: Left ankle fx -- Will reduce and have pt see Dr. Lucia Gaskins in office with plans for ORIF at a later date. NWB in the meantime. Multiple medical problems including HTN and seizure disorder    Lisette Abu, PA-C Orthopedic Surgery [phone] 02/09/2019, 2:40 PM

## 2019-02-09 NOTE — Progress Notes (Signed)
Orthopedic Tech Progress Note Patient Details:  Nanie Humfleet Jul 17, 1940 [id number] MD and the PA did an ankle reduction on this patient and I applied a short leg with stirrup. Ortho Devices Type of Ortho Device: Stirrup splint, Post (short leg) splint Ortho Device/Splint Interventions: Ordered, Application   Post Interventions Patient Tolerated: Well Instructions Provided: Care of device, Adjustment of device   Janit Pagan 02/09/2019, 2:36 PM

## 2019-02-10 ENCOUNTER — Ambulatory Visit: Payer: Medicare Other | Admitting: Family Medicine

## 2019-02-11 ENCOUNTER — Other Ambulatory Visit: Payer: Self-pay | Admitting: Orthopaedic Surgery

## 2019-02-11 DIAGNOSIS — S82842A Displaced bimalleolar fracture of left lower leg, initial encounter for closed fracture: Secondary | ICD-10-CM | POA: Diagnosis not present

## 2019-02-13 ENCOUNTER — Other Ambulatory Visit: Payer: Self-pay

## 2019-02-13 ENCOUNTER — Encounter (HOSPITAL_BASED_OUTPATIENT_CLINIC_OR_DEPARTMENT_OTHER): Payer: Self-pay | Admitting: Orthopaedic Surgery

## 2019-02-16 ENCOUNTER — Telehealth: Payer: Self-pay | Admitting: Family Medicine

## 2019-02-16 ENCOUNTER — Other Ambulatory Visit: Payer: Self-pay

## 2019-02-16 ENCOUNTER — Other Ambulatory Visit (HOSPITAL_COMMUNITY)
Admission: RE | Admit: 2019-02-16 | Discharge: 2019-02-16 | Disposition: A | Discharge status: Home / Self Care | Payer: Medicare Other | Source: Ambulatory Visit | Attending: Orthopaedic Surgery | Admitting: Orthopaedic Surgery

## 2019-02-16 DIAGNOSIS — Z01812 Encounter for preprocedural laboratory examination: Secondary | ICD-10-CM | POA: Diagnosis not present

## 2019-02-16 DIAGNOSIS — Z20822 Contact with and (suspected) exposure to covid-19: Secondary | ICD-10-CM | POA: Insufficient documentation

## 2019-02-16 LAB — SARS CORONAVIRUS 2 (TAT 6-24 HRS): SARS Coronavirus 2: NEGATIVE

## 2019-02-16 MED ORDER — VALACYCLOVIR HCL 500 MG PO TABS: 500.0000 mg | ORAL_TABLET | Freq: Every day | ORAL | 0 refills | Status: DC | PRN

## 2019-02-16 NOTE — Telephone Encounter (Signed)
°  LAST APPOINTMENT DATE: 01/19/2019   NEXT APPOINTMENT DATE:@3 /09/2019  MEDICATION:valACYclovir (VALTREX) 500 MG tablet  PHARMACY: [facility] [place], [place] - [address] AT [facility] [address] [place] Phone:  [phone]  Fax:  [phone]       **Let patient know to contact pharmacy at the end of the day to make sure medication is ready. **  ** Please notify patient to allow 48-72 hours to process**  **Encourage patient to contact the pharmacy for refills or they can request refills through [facility]**  CLINICAL FILLS OUT ALL BELOW:   LAST REFILL:  QTY:  REFILL DATE:    OTHER COMMENTS:    Okay for refill?  Please advise

## 2019-02-16 NOTE — Telephone Encounter (Signed)
Rx sent 

## 2019-02-18 ENCOUNTER — Encounter (HOSPITAL_COMMUNITY): Payer: Self-pay | Admitting: Orthopaedic Surgery

## 2019-02-18 ENCOUNTER — Other Ambulatory Visit: Payer: Self-pay

## 2019-02-19 ENCOUNTER — Ambulatory Visit (HOSPITAL_COMMUNITY): Payer: Medicare Other

## 2019-02-19 ENCOUNTER — Ambulatory Visit (HOSPITAL_COMMUNITY)
Admission: RE | Admit: 2019-02-19 | Discharge: 2019-02-20 | Disposition: A | Discharge status: Home / Self Care | Payer: Medicare Other | Attending: Orthopaedic Surgery | Admitting: Orthopaedic Surgery

## 2019-02-19 ENCOUNTER — Encounter (HOSPITAL_COMMUNITY): Payer: Self-pay | Admitting: Orthopaedic Surgery

## 2019-02-19 ENCOUNTER — Other Ambulatory Visit: Payer: Self-pay

## 2019-02-19 ENCOUNTER — Encounter (HOSPITAL_COMMUNITY)
Admission: RE | Disposition: A | Discharge status: Home / Self Care | Payer: Self-pay | Source: Home / Self Care | Attending: Orthopaedic Surgery

## 2019-02-19 ENCOUNTER — Ambulatory Visit (HOSPITAL_COMMUNITY): Payer: Medicare Other | Admitting: Certified Registered"

## 2019-02-19 DIAGNOSIS — S82832D Other fracture of upper and lower end of left fibula, subsequent encounter for closed fracture with routine healing: Secondary | ICD-10-CM | POA: Diagnosis not present

## 2019-02-19 DIAGNOSIS — M24072 Loose body in left ankle: Secondary | ICD-10-CM | POA: Insufficient documentation

## 2019-02-19 DIAGNOSIS — S82392D Other fracture of lower end of left tibia, subsequent encounter for closed fracture with routine healing: Secondary | ICD-10-CM | POA: Diagnosis not present

## 2019-02-19 DIAGNOSIS — Z86010 Personal history of colonic polyps: Secondary | ICD-10-CM | POA: Insufficient documentation

## 2019-02-19 DIAGNOSIS — E785 Hyperlipidemia, unspecified: Secondary | ICD-10-CM | POA: Insufficient documentation

## 2019-02-19 DIAGNOSIS — Z9109 Other allergy status, other than to drugs and biological substances: Secondary | ICD-10-CM | POA: Insufficient documentation

## 2019-02-19 DIAGNOSIS — Z7951 Long term (current) use of inhaled steroids: Secondary | ICD-10-CM | POA: Insufficient documentation

## 2019-02-19 DIAGNOSIS — K219 Gastro-esophageal reflux disease without esophagitis: Secondary | ICD-10-CM | POA: Diagnosis not present

## 2019-02-19 DIAGNOSIS — Z8261 Family history of arthritis: Secondary | ICD-10-CM | POA: Insufficient documentation

## 2019-02-19 DIAGNOSIS — Z833 Family history of diabetes mellitus: Secondary | ICD-10-CM | POA: Insufficient documentation

## 2019-02-19 DIAGNOSIS — I10 Essential (primary) hypertension: Secondary | ICD-10-CM | POA: Insufficient documentation

## 2019-02-19 DIAGNOSIS — Z8249 Family history of ischemic heart disease and other diseases of the circulatory system: Secondary | ICD-10-CM | POA: Insufficient documentation

## 2019-02-19 DIAGNOSIS — S93432A Sprain of tibiofibular ligament of left ankle, initial encounter: Secondary | ICD-10-CM | POA: Diagnosis not present

## 2019-02-19 DIAGNOSIS — Z823 Family history of stroke: Secondary | ICD-10-CM | POA: Insufficient documentation

## 2019-02-19 DIAGNOSIS — Z811 Family history of alcohol abuse and dependence: Secondary | ICD-10-CM | POA: Diagnosis not present

## 2019-02-19 DIAGNOSIS — W19XXXA Unspecified fall, initial encounter: Secondary | ICD-10-CM | POA: Diagnosis not present

## 2019-02-19 DIAGNOSIS — Z79899 Other long term (current) drug therapy: Secondary | ICD-10-CM | POA: Diagnosis not present

## 2019-02-19 DIAGNOSIS — Z9049 Acquired absence of other specified parts of digestive tract: Secondary | ICD-10-CM | POA: Diagnosis not present

## 2019-02-19 DIAGNOSIS — S82899A Other fracture of unspecified lower leg, initial encounter for closed fracture: Secondary | ICD-10-CM | POA: Diagnosis present

## 2019-02-19 DIAGNOSIS — R011 Cardiac murmur, unspecified: Secondary | ICD-10-CM | POA: Diagnosis not present

## 2019-02-19 DIAGNOSIS — Z791 Long term (current) use of non-steroidal anti-inflammatories (NSAID): Secondary | ICD-10-CM | POA: Insufficient documentation

## 2019-02-19 DIAGNOSIS — Z85828 Personal history of other malignant neoplasm of skin: Secondary | ICD-10-CM | POA: Diagnosis not present

## 2019-02-19 DIAGNOSIS — S82852A Displaced trimalleolar fracture of left lower leg, initial encounter for closed fracture: Secondary | ICD-10-CM | POA: Insufficient documentation

## 2019-02-19 DIAGNOSIS — F419 Anxiety disorder, unspecified: Secondary | ICD-10-CM | POA: Diagnosis not present

## 2019-02-19 DIAGNOSIS — Z8349 Family history of other endocrine, nutritional and metabolic diseases: Secondary | ICD-10-CM | POA: Diagnosis not present

## 2019-02-19 DIAGNOSIS — Z419 Encounter for procedure for purposes other than remedying health state, unspecified: Secondary | ICD-10-CM

## 2019-02-19 DIAGNOSIS — S93439A Sprain of tibiofibular ligament of unspecified ankle, initial encounter: Secondary | ICD-10-CM | POA: Diagnosis not present

## 2019-02-19 DIAGNOSIS — G40909 Epilepsy, unspecified, not intractable, without status epilepticus: Secondary | ICD-10-CM | POA: Insufficient documentation

## 2019-02-19 DIAGNOSIS — R6889 Other general symptoms and signs: Secondary | ICD-10-CM | POA: Diagnosis not present

## 2019-02-19 DIAGNOSIS — M24071 Loose body in right ankle: Secondary | ICD-10-CM | POA: Diagnosis not present

## 2019-02-19 DIAGNOSIS — Z880 Allergy status to penicillin: Secondary | ICD-10-CM | POA: Diagnosis not present

## 2019-02-19 DIAGNOSIS — Z818 Family history of other mental and behavioral disorders: Secondary | ICD-10-CM | POA: Diagnosis not present

## 2019-02-19 HISTORY — DX: Displaced trimalleolar fracture of unspecified lower leg, initial encounter for closed fracture: S82.853A

## 2019-02-19 HISTORY — DX: Epilepsy, unspecified, not intractable, without status epilepticus: G40.909

## 2019-02-19 HISTORY — PX: ORIF ANKLE FRACTURE: SHX5408

## 2019-02-19 HISTORY — DX: Malignant (primary) neoplasm, unspecified: C80.1

## 2019-02-19 HISTORY — PX: SYNDESMOSIS REPAIR: SHX5182

## 2019-02-19 LAB — BASIC METABOLIC PANEL
Anion gap: 14 (ref 5–15)
BUN: 17 mg/dL (ref 8–23)
CO2: 25 mmol/L (ref 22–32)
Calcium: 9.3 mg/dL (ref 8.9–10.3)
Chloride: 101 mmol/L (ref 98–111)
Creatinine, Ser: 0.52 mg/dL (ref 0.44–1.00)
GFR calc Af Amer: >60 mL/min (ref >60)
GFR calc non Af Amer: >60 mL/min (ref >60)
Glucose, Bld: 100 mg/dL — ABNORMAL HIGH (ref 70–99)
Potassium: 3.5 mmol/L (ref 3.5–5.1)
Sodium: 140 mmol/L (ref 135–145)

## 2019-02-19 LAB — CBC
HCT: 40 % (ref 36.0–46.0)
Hemoglobin: 13.3 g/dL (ref 12.0–15.0)
MCH: 30.9 pg (ref 26.0–34.0)
MCHC: 33.3 g/dL (ref 30.0–36.0)
MCV: 93 fL (ref 80.0–100.0)
Platelets: 299 10*3/uL (ref 150–400)
RBC: 4.3 MIL/uL (ref 3.87–5.11)
RDW: 13 % (ref 11.5–15.5)
WBC: 7.4 10*3/uL (ref 4.0–10.5)
nRBC: 0 % (ref 0.0–0.2)

## 2019-02-19 SURGERY — OPEN REDUCTION INTERNAL FIXATION (ORIF) ANKLE FRACTURE
Anesthesia: General | Site: Ankle | Laterality: Left

## 2019-02-19 MED ORDER — FENTANYL CITRATE (PF) 100 MCG/2ML IJ SOLN
INTRAMUSCULAR | Status: AC
  Filled 2019-02-19: qty 2

## 2019-02-19 MED ORDER — CLINDAMYCIN PHOSPHATE 900 MG/50ML IV SOLN
INTRAVENOUS | Status: AC
  Filled 2019-02-19: qty 50

## 2019-02-19 MED ORDER — LACTATED RINGERS IV SOLN: INTRAVENOUS | Status: DC

## 2019-02-19 MED ORDER — SUCCINYLCHOLINE CHLORIDE 200 MG/10ML IV SOSY
PREFILLED_SYRINGE | INTRAVENOUS | Status: AC
  Filled 2019-02-19: qty 10

## 2019-02-19 MED ORDER — HYDROCODONE-ACETAMINOPHEN 7.5-325 MG PO TABS
1.0000 | ORAL_TABLET | ORAL | Status: DC | PRN
  Administered 2019-02-19: 14:00:00 1 via ORAL
  Filled 2019-02-19: qty 1

## 2019-02-19 MED ORDER — LIDOCAINE 2% (20 MG/ML) 5 ML SYRINGE
INTRAMUSCULAR | Status: AC
  Filled 2019-02-19: qty 5

## 2019-02-19 MED ORDER — ACETAMINOPHEN 325 MG PO TABS: 325.0000 mg | ORAL_TABLET | Freq: Four times a day (QID) | ORAL | Status: DC | PRN

## 2019-02-19 MED ORDER — ONDANSETRON HCL 4 MG/2ML IJ SOLN
INTRAMUSCULAR | Status: AC
  Filled 2019-02-19: qty 2

## 2019-02-19 MED ORDER — ROCURONIUM BROMIDE 10 MG/ML (PF) SYRINGE
PREFILLED_SYRINGE | INTRAVENOUS | Status: DC | PRN
  Administered 2019-02-19: 50 mg via INTRAVENOUS

## 2019-02-19 MED ORDER — METOCLOPRAMIDE HCL 5 MG PO TABS: 5.0000 mg | ORAL_TABLET | Freq: Three times a day (TID) | ORAL | Status: DC | PRN

## 2019-02-19 MED ORDER — AMLODIPINE BESYLATE 5 MG PO TABS: 5.0000 mg | ORAL_TABLET | Freq: Every day | ORAL | Status: DC

## 2019-02-19 MED ORDER — METHOCARBAMOL 500 MG PO TABS
500.0000 mg | ORAL_TABLET | Freq: Four times a day (QID) | ORAL | Status: DC | PRN
  Administered 2019-02-19 – 2019-02-20 (×4): 500 mg via ORAL
  Filled 2019-02-19 (×4): qty 1

## 2019-02-19 MED ORDER — METHOCARBAMOL 1000 MG/10ML IJ SOLN
500.0000 mg | Freq: Four times a day (QID) | INTRAVENOUS | Status: DC | PRN
  Filled 2019-02-19: qty 5

## 2019-02-19 MED ORDER — 0.9 % SODIUM CHLORIDE (POUR BTL) OPTIME
TOPICAL | Status: DC | PRN
  Administered 2019-02-19: 10:00:00 1000 mL

## 2019-02-19 MED ORDER — MIDAZOLAM HCL 2 MG/2ML IJ SOLN
INTRAMUSCULAR | Status: AC
  Filled 2019-02-19: qty 2

## 2019-02-19 MED ORDER — PROPOFOL 10 MG/ML IV BOLUS
INTRAVENOUS | Status: AC
  Filled 2019-02-19: qty 20

## 2019-02-19 MED ORDER — HYDROCODONE-ACETAMINOPHEN 5-325 MG PO TABS
1.0000 | ORAL_TABLET | ORAL | Status: DC | PRN
  Administered 2019-02-19 – 2019-02-20 (×2): 2 via ORAL
  Filled 2019-02-19 (×2): qty 2

## 2019-02-19 MED ORDER — DEXAMETHASONE SODIUM PHOSPHATE 10 MG/ML IJ SOLN
INTRAMUSCULAR | Status: AC
  Filled 2019-02-19: qty 1

## 2019-02-19 MED ORDER — ONDANSETRON HCL 4 MG PO TABS: 4.0000 mg | ORAL_TABLET | Freq: Four times a day (QID) | ORAL | Status: DC | PRN

## 2019-02-19 MED ORDER — METOCLOPRAMIDE HCL 5 MG/ML IJ SOLN: 5.0000 mg | Freq: Three times a day (TID) | INTRAMUSCULAR | Status: DC | PRN

## 2019-02-19 MED ORDER — ROCURONIUM BROMIDE 10 MG/ML (PF) SYRINGE
PREFILLED_SYRINGE | INTRAVENOUS | Status: AC
  Filled 2019-02-19: qty 10

## 2019-02-19 MED ORDER — PHENYLEPHRINE 40 MCG/ML (10ML) SYRINGE FOR IV PUSH (FOR BLOOD PRESSURE SUPPORT)
PREFILLED_SYRINGE | INTRAVENOUS | Status: AC
  Filled 2019-02-19: qty 10

## 2019-02-19 MED ORDER — PROPOFOL 10 MG/ML IV BOLUS
INTRAVENOUS | Status: DC | PRN
  Administered 2019-02-19: 120 mg via INTRAVENOUS

## 2019-02-19 MED ORDER — NAPROXEN 250 MG PO TABS
250.0000 mg | ORAL_TABLET | Freq: Two times a day (BID) | ORAL | Status: DC
  Administered 2019-02-19 – 2019-02-20 (×2): 250 mg via ORAL
  Filled 2019-02-19 (×3): qty 1

## 2019-02-19 MED ORDER — CLINDAMYCIN PHOSPHATE 900 MG/50ML IV SOLN
900.0000 mg | INTRAVENOUS | Status: AC
  Administered 2019-02-19: 10:00:00 900 mg via INTRAVENOUS
  Filled 2019-02-19: qty 50

## 2019-02-19 MED ORDER — POVIDONE-IODINE 10 % EX SWAB
2.0000 "application " | Freq: Once | CUTANEOUS | Status: AC
  Administered 2019-02-19: 2 via TOPICAL

## 2019-02-19 MED ORDER — FENTANYL CITRATE (PF) 100 MCG/2ML IJ SOLN
25.0000 ug | INTRAMUSCULAR | Status: AC | PRN
  Administered 2019-02-19 (×6): 25 ug via INTRAVENOUS

## 2019-02-19 MED ORDER — PHENYLEPHRINE 40 MCG/ML (10ML) SYRINGE FOR IV PUSH (FOR BLOOD PRESSURE SUPPORT)
PREFILLED_SYRINGE | INTRAVENOUS | Status: DC | PRN
  Administered 2019-02-19: 80 ug via INTRAVENOUS
  Administered 2019-02-19: 40 ug via INTRAVENOUS

## 2019-02-19 MED ORDER — ALPRAZOLAM 0.25 MG PO TABS
0.1250 mg | ORAL_TABLET | Freq: Every day | ORAL | Status: DC
  Administered 2019-02-19: 0.125 mg via ORAL
  Filled 2019-02-19: qty 1

## 2019-02-19 MED ORDER — DOCUSATE SODIUM 100 MG PO CAPS
100.0000 mg | ORAL_CAPSULE | Freq: Two times a day (BID) | ORAL | Status: DC
  Administered 2019-02-19 – 2019-02-20 (×2): 100 mg via ORAL
  Filled 2019-02-19 (×2): qty 1

## 2019-02-19 MED ORDER — GABAPENTIN 300 MG PO CAPS
300.0000 mg | ORAL_CAPSULE | Freq: Three times a day (TID) | ORAL | Status: DC
  Administered 2019-02-19 – 2019-02-20 (×4): 300 mg via ORAL
  Filled 2019-02-19 (×4): qty 1

## 2019-02-19 MED ORDER — ACETAMINOPHEN 500 MG PO TABS
500.0000 mg | ORAL_TABLET | Freq: Four times a day (QID) | ORAL | Status: AC
  Administered 2019-02-19 – 2019-02-20 (×3): 500 mg via ORAL
  Filled 2019-02-19 (×3): qty 1

## 2019-02-19 MED ORDER — MIDAZOLAM HCL 2 MG/2ML IJ SOLN: 1.0000 mg | INTRAMUSCULAR | Status: DC | PRN

## 2019-02-19 MED ORDER — OXCARBAZEPINE 300 MG PO TABS
300.0000 mg | ORAL_TABLET | Freq: Two times a day (BID) | ORAL | Status: DC
  Administered 2019-02-19 – 2019-02-20 (×2): 300 mg via ORAL
  Filled 2019-02-19 (×3): qty 1

## 2019-02-19 MED ORDER — ONDANSETRON HCL 4 MG/2ML IJ SOLN
INTRAMUSCULAR | Status: DC | PRN
  Administered 2019-02-19: 4 mg via INTRAVENOUS

## 2019-02-19 MED ORDER — CLINDAMYCIN PHOSPHATE 600 MG/50ML IV SOLN
600.0000 mg | Freq: Four times a day (QID) | INTRAVENOUS | Status: AC
  Administered 2019-02-19 – 2019-02-20 (×3): 600 mg via INTRAVENOUS
  Filled 2019-02-19 (×3): qty 50

## 2019-02-19 MED ORDER — FENTANYL CITRATE (PF) 250 MCG/5ML IJ SOLN
INTRAMUSCULAR | Status: AC
  Filled 2019-02-19: qty 5

## 2019-02-19 MED ORDER — MORPHINE SULFATE (PF) 2 MG/ML IV SOLN
0.5000 mg | INTRAVENOUS | Status: DC | PRN
  Administered 2019-02-19 – 2019-02-20 (×4): 1 mg via INTRAVENOUS
  Filled 2019-02-19 (×4): qty 1

## 2019-02-19 MED ORDER — ONDANSETRON HCL 4 MG/2ML IJ SOLN: 4.0000 mg | Freq: Four times a day (QID) | INTRAMUSCULAR | Status: DC | PRN

## 2019-02-19 MED ORDER — FENTANYL CITRATE (PF) 100 MCG/2ML IJ SOLN
50.0000 ug | INTRAMUSCULAR | Status: AC | PRN
  Administered 2019-02-19 (×2): 25 ug via INTRAVENOUS
  Administered 2019-02-19: 11:00:00 50 ug via INTRAVENOUS
  Administered 2019-02-19: 100 ug via INTRAVENOUS
  Administered 2019-02-19 (×2): 25 ug via INTRAVENOUS

## 2019-02-19 MED ORDER — LIDOCAINE 2% (20 MG/ML) 5 ML SYRINGE
INTRAMUSCULAR | Status: DC | PRN
  Administered 2019-02-19: 40 mg via INTRAVENOUS

## 2019-02-19 MED ORDER — DEXAMETHASONE SODIUM PHOSPHATE 10 MG/ML IJ SOLN
INTRAMUSCULAR | Status: DC | PRN
  Administered 2019-02-19: 10 mg via INTRAVENOUS

## 2019-02-19 MED ORDER — SUGAMMADEX SODIUM 200 MG/2ML IV SOLN
INTRAVENOUS | Status: DC | PRN
  Administered 2019-02-19: 200 mg via INTRAVENOUS

## 2019-02-19 SURGICAL SUPPLY — 79 items
ALCOHOL 70% 16 OZ (MISCELLANEOUS) ×4 IMPLANT
BANDAGE ESMARK 6X9 LF (GAUZE/BANDAGES/DRESSINGS) IMPLANT
BIT DRILL 1.7 (BIT) ×2
BIT DRILL 1.7X (BIT) IMPLANT
BIT DRILL 2 CANN GRADUATED (BIT) ×2 IMPLANT
BIT DRILL 2.5 CANN LNG (BIT) ×2 IMPLANT
BIT DRILL 2.7 (BIT) ×2
BIT DRILL 2.7X2.7/3XSCR ANKL (BIT) IMPLANT
BIT DRL 2.7X2.7/3XSCR ANKL (BIT) ×2
BLADE SURG 10 STRL SS (BLADE) ×2 IMPLANT
BLADE SURG 15 STRL LF DISP TIS (BLADE) ×2 IMPLANT
BLADE SURG 15 STRL SS (BLADE) ×2
BNDG COHESIVE 4X5 TAN STRL (GAUZE/BANDAGES/DRESSINGS) IMPLANT
BNDG COHESIVE 6X5 TAN STRL LF (GAUZE/BANDAGES/DRESSINGS) IMPLANT
BNDG ELASTIC 6X10 VLCR STRL LF (GAUZE/BANDAGES/DRESSINGS) ×4 IMPLANT
BNDG ESMARK 6X9 LF (GAUZE/BANDAGES/DRESSINGS)
CANISTER SUCT 3000ML PPV (MISCELLANEOUS) ×4 IMPLANT
CHLORAPREP W/TINT 26 (MISCELLANEOUS) ×8 IMPLANT
COVER SURGICAL LIGHT HANDLE (MISCELLANEOUS) ×4 IMPLANT
COVER WAND RF STERILE (DRAPES) ×4 IMPLANT
CUFF TOURN SGL QUICK 34 (TOURNIQUET CUFF) ×2
CUFF TOURN SGL QUICK 42 (TOURNIQUET CUFF) IMPLANT
CUFF TRNQT CYL 34X4.125X (TOURNIQUET CUFF) ×2 IMPLANT
DRAPE OEC MINIVIEW 54X84 (DRAPES) ×4 IMPLANT
DRAPE U-SHAPE 47X51 STRL (DRAPES) ×4 IMPLANT
DRSG MEPITEL 4X7.2 (GAUZE/BANDAGES/DRESSINGS) ×4 IMPLANT
DRSG PAD ABDOMINAL 8X10 ST (GAUZE/BANDAGES/DRESSINGS) ×8 IMPLANT
DRSG XEROFORM 1X8 (GAUZE/BANDAGES/DRESSINGS) ×4 IMPLANT
ELECT REM PT RETURN 9FT ADLT (ELECTROSURGICAL) ×4
ELECTRODE REM PT RTRN 9FT ADLT (ELECTROSURGICAL) ×2 IMPLANT
GAUZE SPONGE 4X4 12PLY STRL (GAUZE/BANDAGES/DRESSINGS) IMPLANT
GAUZE SPONGE 4X4 12PLY STRL LF (GAUZE/BANDAGES/DRESSINGS) ×4 IMPLANT
GLOVE BIOGEL M STRL SZ7.5 (GLOVE) ×4 IMPLANT
GLOVE BIOGEL PI IND STRL 8 (GLOVE) ×2 IMPLANT
GLOVE BIOGEL PI INDICATOR 8 (GLOVE) ×2
GOWN STRL REUS W/ TWL LRG LVL3 (GOWN DISPOSABLE) ×2 IMPLANT
GOWN STRL REUS W/ TWL XL LVL3 (GOWN DISPOSABLE) ×2 IMPLANT
GOWN STRL REUS W/TWL LRG LVL3 (GOWN DISPOSABLE) ×2
GOWN STRL REUS W/TWL XL LVL3 (GOWN DISPOSABLE) ×2
GUIDEWIRE 1.6 (WIRE) ×2
GUIDEWIRE ORTH 157X1.6XTROC (WIRE) IMPLANT
KIT BASIN OR (CUSTOM PROCEDURE TRAY) ×4 IMPLANT
KIT TURNOVER KIT B (KITS) ×4 IMPLANT
NS IRRIG 1000ML POUR BTL (IV SOLUTION) ×4 IMPLANT
PACK ORTHO EXTREMITY (CUSTOM PROCEDURE TRAY) ×4 IMPLANT
PAD ABD 8X10 STRL (GAUZE/BANDAGES/DRESSINGS) ×2 IMPLANT
PAD ARMBOARD 7.5X6 YLW CONV (MISCELLANEOUS) ×8 IMPLANT
PAD CAST 4YDX4 CTTN HI CHSV (CAST SUPPLIES) ×2 IMPLANT
PADDING CAST COTTON 4X4 STRL (CAST SUPPLIES) ×2
PLATE LOCK DIST FIB 5H LT SS (Plate) ×2 IMPLANT
PLATE Y 2.4 6H (Plate) ×2 IMPLANT
SCREW CAN THREAD 3X18 (Screw) ×2 IMPLANT
SCREW CORT 24X2.4XLOPRFL NS LF (Screw) IMPLANT
SCREW CORT LP 2.4X24 (Screw) ×4 IMPLANT
SCREW CORTEX LP TM SS 2.7X16 (Screw) ×2 IMPLANT
SCREW CPRT 2.4X20 (Screw) ×2 IMPLANT
SCREW LOCK 18X2.4XVANS LF (Screw) IMPLANT
SCREW LOCK 2.4X10 (Screw) ×2 IMPLANT
SCREW LOCK 2.4X11 (Screw) ×4 IMPLANT
SCREW LOCK 2.4X22 (Screw) ×4 IMPLANT
SCREW LOCK 2.4X22 VAR ANGLE (Screw) IMPLANT
SCREW LOCKING 2.4X18 (Screw) ×2 IMPLANT
SCREW LOCKING 2.7X10 ANKLE (Screw) ×2 IMPLANT
SCREW LOCKING 2.7X14MM (Screw) ×2 IMPLANT
SCREW LOCKING 2.7X16MM (Screw) ×2 IMPLANT
SCREW LOW PROFILE 3.5X14 (Screw) ×2 IMPLANT
SCREW LOW PROFILE 3.5X16 (Screw) ×4 IMPLANT
SPONGE LAP 18X18 RF (DISPOSABLE) ×4 IMPLANT
SUCTION FRAZIER HANDLE 10FR (MISCELLANEOUS) ×2
SUCTION TUBE FRAZIER 10FR DISP (MISCELLANEOUS) ×2 IMPLANT
SUT ETHILON 3 0 PS 1 (SUTURE) ×4 IMPLANT
SUT MON AB 3-0 SH 27 (SUTURE) ×4
SUT MON AB 3-0 SH27 (SUTURE) ×2 IMPLANT
SUT VIC AB 2-0 CT1 27 (SUTURE) ×4
SUT VIC AB 2-0 CT1 TAPERPNT 27 (SUTURE) ×4 IMPLANT
TOWEL GREEN STERILE (TOWEL DISPOSABLE) ×4 IMPLANT
TOWEL GREEN STERILE FF (TOWEL DISPOSABLE) ×4 IMPLANT
TUBE CONNECTING 12'X1/4 (SUCTIONS) ×1
TUBE CONNECTING 12X1/4 (SUCTIONS) ×3 IMPLANT

## 2019-02-19 NOTE — H&P (Signed)
Joan Mclaughlin is an 79 y.o. female.   Chief Complaint: Left trimalleolar ankle fracture dislocation HPI: Joan Mclaughlin is here today for surgical correction of her left trimalleolar ankle fracture.  She sustained this injury approximately 1/2 weeks ago.  She was splinted in the ER after close reduction was performed.  Given the displacement and instability pattern of her injury she is indicated for open treatment.  She has had continued pain in her left ankle that has improved somewhat over the past few days.  She has been maintaining nonweightbearing.  She lives alone and is hoping that her son will be able to come stay with her while she recovers.  She denies any symptomatic numbness or tingling.  Past Medical History:  Diagnosis Date  . Alcoholism ([place])    sober for 47 years-noted 09/16/2018  . Allergy   . Anxiety   . Cancer ([place])    skin cancer  . Cholecystitis   . Complication of anesthesia   . Difficulty sleeping    takes med to sleep  . Diverticulitis   . Family history of adverse reaction to anesthesia    [place] - N/V  . Gallstones   . Ganglion cyst 12/11/2016  . GERD (gastroesophageal reflux disease)    "RELATED TO GALLBLADDER"  . Heart murmur    "ONLY DETECTED WHEN LYING DOWN"  - 10/16/2018- Not concerned with  . History of Lyme disease 09/19/2016  . Hyperlipidemia   . Hypertension   . Lyme disease    Pt states she has had Lyme Disease twice.   . Nocturia   . PONV (postoperative nausea and vomiting)    Nausea  . S/P laparoscopic cholecystectomy 12/23/2014  . Seizure disorder ([place])   . Seizures ([place])   . Seizures ([place])    "last one 10- 15 years ago - 10/16/2018  . Trimalleolar fracture    left  . Tubular adenoma of colon 06/27/2018    Past Surgical History:  Procedure Laterality Date  . CHOLECYSTECTOMY N/A 12/23/2014   Procedure: LAPAROSCOPIC CHOLECYSTECTOMY WITH INTRAOPERATIVE CHOLANGIOGRAM;  Surgeon: Greer Pickerel, MD;  Location: [facility];  Service: General;  Laterality:  N/A;  . COLONOSCOPY     "3 times trying to get large polyp since August 2020"  . COLONOSCOPY WITH PROPOFOL N/A 10/20/2018   Procedure: COLONOSCOPY WITH PROPOFOL;  Surgeon: Rush Landmark Telford Nab., MD;  Location: [facility];  Service: Gastroenterology;  Laterality: N/A;  . ENDOSCOPIC MUCOSAL RESECTION N/A 10/20/2018   Procedure: ENDOSCOPIC MUCOSAL RESECTION;  Surgeon: Rush Landmark Telford Nab., MD;  Location: [facility];  Service: Gastroenterology;  Laterality: N/A;  . HEMOSTASIS CLIP PLACEMENT  10/20/2018   Procedure: HEMOSTASIS CLIP PLACEMENT;  Surgeon: Irving Copas., MD;  Location: [facility];  Service: Gastroenterology;;  . POLYPECTOMY  10/20/2018   Procedure: POLYPECTOMY;  Surgeon: Irving Copas., MD;  Location: [facility];  Service: Gastroenterology;;  . Lia Foyer LIFTING INJECTION  10/20/2018   Procedure: SUBMUCOSAL LIFTING INJECTION;  Surgeon: Irving Copas., MD;  Location: [facility];  Service: Gastroenterology;;  . THORACIC DISCECTOMY  07/2007   T12  . TUBAL LIGATION    . WISDOM TOOTH EXTRACTION      Family History  Problem Relation Age of Onset  . Arthritis Father   . Alcohol abuse Mother        drinker and smoker  . Diabetes Mother   . Heart disease Mother   . Hypertension Mother   . Stroke Mother   . Depression Sister   . Hearing  loss Sister   . Hyperlipidemia Sister   . Alcohol abuse Daughter   . Alcohol abuse Son   . Depression Sister   . Early death Sister   . Alcohol abuse Brother   . Depression Sister   . Heart disease Sister   . Colon cancer Neg Hx   . Esophageal cancer Neg Hx   . Inflammatory bowel disease Neg Hx   . Liver disease Neg Hx   . Pancreatic cancer Neg Hx   . Rectal cancer Neg Hx   . Stomach cancer Neg Hx    Social History:  reports that she has never smoked. She has never used smokeless tobacco. She reports that she does not drink alcohol or use drugs.  Allergies:  Allergies  Allergen Reactions  .  Amoxicillin-Pot Clavulanate Nausea And Vomiting    Did it involve swelling of the face/tongue/throat, SOB, or low BP? No Did it involve sudden or severe rash/hives, skin peeling, or any reaction on the inside of your mouth or nose? No Did you need to seek medical attention at a hospital or doctor's office? No When did it last happen?in 2016 If all above answers are "NO", may proceed with cephalosporin use.   . Suvorexant Other (See Comments)    Nightmares     Medications Prior to Admission  Medication Sig Dispense Refill  . ALPRAZolam (XANAX) 0.25 MG tablet TAKE 1/2 TABLET BY MOUTH EVERY NIGHT AT BEDTIME AS NEEDED FOR ANXIETY. (Patient taking differently: Take 0.125 mg by mouth at bedtime. ) 30 tablet 5  . amLODipine (NORVASC) 5 MG tablet TAKE 1 TABLET(5 MG) BY MOUTH DAILY (Patient taking differently: Take 5 mg by mouth daily. ) 90 tablet 0  . Ascorbic Acid (VITAMIN C) 1000 MG tablet Take 1,000 mg by mouth daily.    Marland Kitchen CALCIUM-MAG-VIT C-VIT D PO Take 1 tablet by mouth daily.    . Cholecalciferol (VITAMIN D3) 250 MCG (10000 UT) capsule Take 20,000 Units by mouth daily.    . Cyanocobalamin 1500 MCG TBDP Take 1,500 mcg by mouth daily.    . Garlic [id number] MG CAPS Take 500 mg by mouth daily.    . hydrocortisone 2.5 % cream Apply 1 application topically daily.     . hydrOXYzine (ATARAX/VISTARIL) 10 MG tablet Take 10 mg by mouth at bedtime.     Marland Kitchen ibuprofen (ADVIL) 200 MG tablet Take 200 mg by mouth every 6 (six) hours as needed for headache or moderate pain.    . Multiple Vitamin (MULTIVITAMIN WITH MINERALS) TABS tablet Take 1 tablet by mouth daily.    . Omega-3 Fatty Acids (SALMON OIL-1000 PO) Take 1,000 mg by mouth daily.    Marland Kitchen OVER THE COUNTER MEDICATION Take 1 capsule by mouth daily. "microplex"    . OVER THE COUNTER MEDICATION Take 1 tablet by mouth daily. Cellular vitality complex    . Oxcarbazepine (TRILEPTAL) 300 MG tablet TAKE 1 TABLET(300 MG) BY MOUTH TWICE DAILY (Patient taking  differently: Take 300 mg by mouth 2 (two) times daily. ) 180 tablet 3  . Potassium 99 MG TABS Take 99 mg by mouth daily.    . Probiotic CAPS Take 1 capsule by mouth daily.    . Red Yeast Rice 600 MG CAPS Take 1,200 mg by mouth daily.    . Turmeric 500 MG CAPS Take 500 mg by mouth daily.    . valACYclovir (VALTREX) 500 MG tablet Take 1 tablet (500 mg total) by mouth daily as needed (cold sores). [place]  tablet 0  . Zinc 30 MG CAPS Take 30 mg by mouth daily.    Marland Kitchen acetaminophen (TYLENOL) 500 MG tablet Take 1 tablet (500 mg total) by mouth every 6 (six) hours as needed for moderate pain. (Patient not taking: Reported on 02/18/2019) 30 tablet 0  . cetirizine (ZYRTEC) 10 MG tablet Take 10 mg by mouth daily as needed for allergies.     Marland Kitchen HYDROcodone-acetaminophen (NORCO/VICODIN) 5-325 MG tablet Take 1 tablet by mouth every 4 (four) hours as needed for severe pain. (Patient not taking: Reported on 02/18/2019) 10 tablet 0  . mometasone (ELOCON) 0.1 % lotion Apply 3 drops topically daily as needed for itching. Uses in ears      Results for orders placed or performed during the hospital encounter of 02/19/19 (from the past 48 hour(s))  CBC     Status: None   Collection Time: 02/19/19  8:45 AM  Result Value Ref Range   WBC 7.4 4.0 - 10.5 K/uL   RBC 4.30 3.87 - 5.11 MIL/uL   Hemoglobin 13.3 12.0 - 15.0 g/dL   HCT 40.0 36.0 - 46.0 %   MCV 93.0 80.0 - 100.0 fL   MCH 30.9 26.0 - 34.0 pg   MCHC 33.3 30.0 - 36.0 g/dL   RDW 13.0 11.5 - 15.5 %   Platelets 299 150 - 400 K/uL   nRBC 0.0 0.0 - 0.2 %    Comment: Performed at [facility], [place] [address], [place], [place] [id number]  Basic metabolic panel     Status: Abnormal   Collection Time: 02/19/19  8:45 AM  Result Value Ref Range   Sodium 140 135 - 145 mmol/L   Potassium 3.5 3.5 - 5.1 mmol/L   Chloride 101 98 - 111 mmol/L   CO2 25 22 - 32 mmol/L   Glucose, Bld 100 (H) 70 - 99 mg/dL   BUN 17 8 - 23 mg/dL   Creatinine, Ser 0.52 0.44 - 1.00 mg/dL    Calcium 9.3 8.9 - 10.3 mg/dL   GFR calc non Af Amer >60 >60 mL/min   GFR calc Af Amer >60 >60 mL/min   Anion gap 14 5 - 15    Comment: Performed at [facility] [address] [place], [place] [address]   No results found.  Review of Systems  Constitutional: Negative.   Eyes: Negative.   Respiratory: Negative.   Cardiovascular: Negative.   Gastrointestinal: Negative.   Musculoskeletal:       Left ankle pain  Skin: Negative.   Psychiatric/Behavioral: Negative.     Blood pressure (!) 121/58, pulse 80, temperature 98.3 F (36.8 C), resp. rate 18, height 5\' 2"  (1.575 m), weight 66.2 kg, SpO2 95 %. Physical Exam  Constitutional: She appears well-developed.  HENT:  Head: Normocephalic.  Eyes: Conjunctivae are normal.  Cardiovascular: Normal rate.  Respiratory: Effort normal.  GI: Soft.  Musculoskeletal:     Cervical back: Neck supple.     Comments: Left ankle with short leg splint in place.  Toes exposed are warm and well-perfused.  Stable to wiggle toes.  No tenderness about the forefoot.  No tenderness proximal to the splint.  Splint is in an inverted position.  No evidence of other joint or extremity injury.  Neurological: She is alert.  Skin: Skin is warm.  Psychiatric: She has a normal mood and affect.     Assessment/Plan We will plan for open treatment of her trimalleolar ankle fracture without posterior fixation.  We will also stress the syndesmosis under fluoroscopy and perform open reduction of the syndesmosis if needed.  She may require an ankle arthrotomy given the comminution along the medial malleolus.  She understands the risk benefits alternatives of surgery which include but not limited to wound healing complications, infection, nonunion, malunion, continued pain, development of arthritis and need for further surgery.  She also understands the perioperative and anesthetic risk which include death.  She would like to proceed with surgery.  Postoperatively she  will be admitted for observation to mobilize with physical therapy and for pain control purposes.  Disposition per physical therapy recommendations postoperatively.  Erle Crocker, MD 02/19/2019, 9:14 AM

## 2019-02-19 NOTE — Transfer of Care (Signed)
Immediate Anesthesia Transfer of Care Note  Patient: Coua Caudill  Procedure(s) Performed: OPEN REDUCTION INTERNAL FIXATION (ORIF) LEFT ANKLE FRACTURE (Left Ankle) SYNDESMOSIS REPAIR (Left )  Patient Location: PACU  Anesthesia Type:General  Level of Consciousness: awake, alert  and oriented  Airway & Oxygen Therapy: Patient Spontanous Breathing and Patient connected to face mask oxygen  Post-op Assessment: Report given to RN, Post -op Vital signs reviewed and stable and Patient moving all extremities X 4  Post vital signs: Reviewed and stable  Last Vitals:  Vitals Value Taken Time  BP 175/88 02/19/19 1148  Temp    Pulse 99 02/19/19 1151  Resp 19 02/19/19 1151  SpO2 100 % 02/19/19 1151  Vitals shown include unvalidated device data.  Last Pain:  Vitals:   02/19/19 0801  PainSc: 2          Complications: No apparent anesthesia complications

## 2019-02-19 NOTE — Anesthesia Preprocedure Evaluation (Addendum)
Anesthesia Evaluation  Patient identified by MRN, date of birth, ID band Patient awake    Reviewed: Allergy & Precautions, NPO status , Patient's Chart, lab work & pertinent test results  History of Anesthesia Complications (+) PONV  Airway Mallampati: II  TM Distance: >3 FB     Dental   Pulmonary    breath sounds clear to auscultation       Cardiovascular hypertension,  Rhythm:Regular Rate:Normal     Neuro/Psych Seizures -,  Anxiety    GI/Hepatic GERD  ,  Endo/Other    Renal/GU      Musculoskeletal   Abdominal   Peds  Hematology   Anesthesia Other Findings   Reproductive/Obstetrics                            Anesthesia Physical Anesthesia Plan  ASA: III  Anesthesia Plan: General   Post-op Pain Management:    Induction: Intravenous  PONV Risk Score and Plan: 3 and Ondansetron, Dexamethasone and Midazolam  Airway Management Planned: Oral ETT  Additional Equipment:   Intra-op Plan:   Post-operative Plan: Extubation in OR  Informed Consent: I have reviewed the patients History and Physical, chart, labs and discussed the procedure including the risks, benefits and alternatives for the proposed anesthesia with the patient or authorized representative who has indicated his/her understanding and acceptance.     Dental advisory given  Plan Discussed with: CRNA and Anesthesiologist  Anesthesia Plan Comments:         Anesthesia Quick Evaluation

## 2019-02-19 NOTE — Evaluation (Signed)
Physical Therapy Evaluation Patient Details Name: Joan Mclaughlin MRN: [id number] DOB: 1940/08/17 Today's Date: 02/19/2019   History of Present Illness  Pt is an 79 y/o female s/p ORIF of L ankle fx. PMH includes anxiety, HTN, and seizure.   Clinical Impression  Pt is s/p surgery above with deficits below. Pt with increased pain which limited mobility tolerance. Pt requiring min A to transfer to [facility] using RW. Was able to maintain NWB on LLE. Per pt, her son is coming in to assist as needed. Pt currently refusing 3 in1. May benefit from [facility] pending mobility progress; will reassess during next session. Will continue to follow acutely to maximize functional mobility independence and safety.     Follow Up Recommendations Supervision/Assistance - 24 hour;Home health PT(24/7 initially )    Equipment Recommendations  Other (comment)(TBD)    Recommendations for Other Services       Precautions / Restrictions Precautions Precautions: Fall Restrictions Weight Bearing Restrictions: Yes LLE Weight Bearing: Non weight bearing      Mobility  Bed Mobility Overal bed mobility: Needs Assistance Bed Mobility: Supine to Sit;Sit to Supine     Supine to sit: Min assist Sit to supine: Min assist   General bed mobility comments: Min A for LLE assist   Transfers Overall transfer level: Needs assistance Equipment used: Rolling walker (2 wheeled) Transfers: Sit to/from [facility] Sit to Stand: Min assist Stand pivot transfers: Min assist       General transfer comment: Min A for steadying assist to stand and transfer to [facility]. Pt able to maintain NWB appropriately. Further mobility deferred secondary to pain.   Ambulation/Gait                Stairs            Wheelchair Mobility    Modified Rankin (Stroke Patients Only)       Balance Overall balance assessment: Needs assistance Sitting-balance support: No upper extremity supported;Feet  supported Sitting balance-Leahy Scale: Fair     Standing balance support: Bilateral upper extremity supported;During functional activity Standing balance-Leahy Scale: Poor Standing balance comment: Reliant on BUE support                              Pertinent Vitals/Pain Pain Assessment: 0-10 Pain Score: 10-Worst pain ever Pain Location: L ankle  Pain Descriptors / Indicators: Aching;Operative site guarding Pain Intervention(s): Limited activity within patient's tolerance;Monitored during session;Repositioned    Home Living Family/patient expects to be discharged to:: Private residence Living Arrangements: Alone Available Help at Discharge: Family Type of Home: House Home Access: Stairs to enter   Technical brewer of Steps: 3 Home Layout: One level Home Equipment: Walker - 2 wheels Additional Comments: Reports her son is going to come stay with her.     Prior Function Level of Independence: Independent with assistive device(s)         Comments: reports she was using RW for household distances.      Hand Dominance        Extremity/Trunk Assessment   Upper Extremity Assessment Upper Extremity Assessment: Defer to OT evaluation    Lower Extremity Assessment Lower Extremity Assessment: LLE deficits/detail LLE Deficits / Details: deficits consistent with post op pain and weakness.        Communication   Communication: No difficulties  Cognition Arousal/Alertness: Awake/alert Behavior During Therapy: WFL for tasks assessed/performed Overall Cognitive Status: Within Functional Limits for tasks  assessed                                        General Comments      Exercises     Assessment/Plan    PT Assessment Patient needs continued PT services  PT Problem List Decreased strength;Decreased balance;Decreased activity tolerance;Decreased mobility;Decreased knowledge of use of DME;Decreased knowledge of precautions;Pain        PT Treatment Interventions DME instruction;Gait training;Functional mobility training;Stair training;Therapeutic exercise;Therapeutic activities;Balance training;Patient/family education    PT Goals (Current goals can be found in the Care Plan section)  Acute Rehab PT Goals Patient Stated Goal: to decrease pain PT Goal Formulation: With patient Time For Goal Achievement: 03/05/19 Potential to Achieve Goals: Good    Frequency Min 5X/week   Barriers to discharge Decreased caregiver support Reports son will be coming in to stay with her    Co-evaluation               AM-PAC PT "6 Clicks" Mobility  Outcome Measure Help needed turning from your back to your side while in a flat bed without using bedrails?: A Little Help needed moving from lying on your back to sitting on the side of a flat bed without using bedrails?: A Little Help needed moving to and from a bed to a chair (including a wheelchair)?: A Little Help needed standing up from a chair using your arms (e.g., wheelchair or bedside chair)?: A Little Help needed to walk in hospital room?: A Lot Help needed climbing 3-5 steps with a railing? : A Lot 6 Click Score: 16    End of Session Equipment Utilized During Treatment: (pt refused gait belt) Activity Tolerance: Patient limited by pain Patient left: in bed;with call bell/phone within reach;with family/visitor present;with nursing/sitter in room Nurse Communication: Mobility status PT Visit Diagnosis: Other abnormalities of gait and mobility (R26.89);Difficulty in walking, not elsewhere classified (R26.2);Pain Pain - Right/Left: Left Pain - part of body: Ankle and joints of foot    Time: [id number] PT Time Calculation (min) (ACUTE ONLY): 24 min   Charges:   PT Evaluation $PT Eval Low Complexity: 1 Low PT Treatments $Therapeutic Activity: 8-22 mins        Lou Miner, DPT  Acute Rehabilitation Services  Pager: [phone] Office: [phone]   Rudean Hitt 02/19/2019, 5:57 PM

## 2019-02-19 NOTE — Anesthesia Procedure Notes (Addendum)
Procedure Name: Intubation Date/Time: 02/19/2019 9:45 AM Performed by: Gaylene Brooks, CRNA Pre-anesthesia Checklist: Patient identified, Emergency Drugs available, Suction available and Patient being monitored Patient Re-evaluated:Patient Re-evaluated prior to induction Oxygen Delivery Method: Circle System Utilized Preoxygenation: Pre-oxygenation with 100% oxygen Induction Type: IV induction Ventilation: Mask ventilation without difficulty Laryngoscope Size: Mac and 3 Grade View: Grade II Tube type: Oral Tube size: 6.5 mm Number of attempts: 1 Airway Equipment and Method: Stylet and Oral airway Placement Confirmation: ETT inserted through vocal cords under direct vision,  positive ETCO2 and breath sounds checked- equal and bilateral Secured at: 22 cm Tube secured with: Tape Dental Injury: Teeth and Oropharynx as per pre-operative assessment  Comments: Performed by Fernanda Drum

## 2019-02-19 NOTE — Discharge Instructions (Signed)
DR. Marland Reine FOOT & ANKLE SURGERY POST-OP INSTRUCTIONS   Pain Management 1. The numbing medicine and your leg will last around 18 hours, take a dose of your pain medicine as soon as you feel it wearing off to avoid rebound pain. 2. Keep your foot elevated above heart level.  Make sure that your heel hangs free ('floats'). 3. Take all prescribed medication as directed. 4. If taking narcotic pain medication you may want to use an over-the-counter stool softener to avoid constipation. 5. You may take over-the-counter NSAIDs (ibuprofen, naproxen, etc.) as well as over-the-counter acetaminophen as directed on the packaging as a supplement for your pain and may also use it to wean away from the prescription medication.  Activity ? Non-weightbearing ? Keep splint intact  First Postoperative Visit 1. Your first postop visit will be at least 2 weeks after surgery.  This should be scheduled when you schedule surgery. 2. If you do not have a postoperative visit scheduled please call [phone] to schedule an appointment. 3. At the appointment your incision will be evaluated for suture removal, x-rays will be obtained if necessary.  General Instructions 1. Swelling is very common after foot and ankle surgery.  It often takes 3 months for the foot and ankle to begin to feel comfortable.  Some amount of swelling will persist for 6-12 months. 2. DO NOT change the dressing.  If there is a problem with the dressing (too tight, loose, gets wet, etc.) please contact Dr. Shaylea Ucci's office. 3. DO NOT get the dressing wet.  For showers you can use an over-the-counter cast cover or wrap a washcloth around the top of your dressing and then cover it with a plastic bag and tape it to your leg. 4. DO NOT soak the incision (no tubs, pools, bath, etc.) until you have approval from Dr. Naileah Karg.  Contact Dr. Adairs office or go to Emergency Room if: 1. Temperature above 101 F. 2. Increasing pain that is unresponsive to pain  medication or elevation 3. Excessive redness or swelling in your foot 4. Dressing problems - excessive bloody drainage, looseness or tightness, or if dressing gets wet 5. Develop pain, swelling, warmth, or discoloration of your calf  

## 2019-02-19 NOTE — Anesthesia Postprocedure Evaluation (Signed)
Anesthesia Post Note  Patient: Joan Mclaughlin  Procedure(s) Performed: OPEN REDUCTION INTERNAL FIXATION (ORIF) LEFT ANKLE FRACTURE (Left Ankle) SYNDESMOSIS REPAIR (Left )     Patient location during evaluation: PACU Anesthesia Type: General Level of consciousness: awake Pain management: pain level controlled Vital Signs Assessment: post-procedure vital signs reviewed and stable Respiratory status: spontaneous breathing Cardiovascular status: stable Postop Assessment: no apparent nausea or vomiting Anesthetic complications: no    Last Vitals:  Vitals:   02/19/19 1333 02/19/19 1351  BP: (!) 153/72 (!) 154/81  Pulse: (!) 106 (!) 103  Resp: 15 18  Temp: 37.1 C 36.8 C  SpO2: 96% 95%    Last Pain:  Vitals:   02/19/19 1553  TempSrc:   PainSc: 2                  Milley Vining

## 2019-02-19 NOTE — Brief Op Note (Signed)
02/19/2019  11:22 AM  PATIENT:  Joan Mclaughlin  79 y.o. female  PRE-OPERATIVE DIAGNOSIS:  LEFT TRIMALL ANKLE FRACTURE S82.84, [phone]  POST-OPERATIVE DIAGNOSIS:  LEFT TRIMALL ANKLE FRACTURE S82.84, S93.439A Syndesmosis disruption Ankle intraarticular loose bodies  PROCEDURE:  Procedure(s): OPEN REDUCTION INTERNAL FIXATION (ORIF) LEFT ANKLE FRACTURE (Left) SYNDESMOSIS REPAIR (Left)  Left ankle arthrotomy  SURGEON:  Surgeon(s) and Role:    Erle Crocker, MD - Primary  PHYSICIAN ASSISTANT:   ASSISTANTS: Carla RNFA   ANESTHESIA:   general  EBL:  minimal   BLOOD ADMINISTERED:none  DRAINS: none   LOCAL MEDICATIONS USED:  NONE  SPECIMEN:  No Specimen  DISPOSITION OF SPECIMEN:  N/A  COUNTS:  YES  TOURNIQUET:  * Missing tourniquet times found for documented tourniquets in log: [id number] *  DICTATION: .Viviann Spare Dictation  PLAN OF CARE: Admit for overnight observation  PATIENT DISPOSITION:  PACU - hemodynamically stable.   Delay start of Pharmacological VTE agent (>24hrs) due to surgical blood loss or risk of bleeding: no

## 2019-02-20 ENCOUNTER — Encounter: Payer: Self-pay | Admitting: *Deleted

## 2019-02-20 DIAGNOSIS — S82852A Displaced trimalleolar fracture of left lower leg, initial encounter for closed fracture: Secondary | ICD-10-CM | POA: Diagnosis not present

## 2019-02-20 DIAGNOSIS — Z880 Allergy status to penicillin: Secondary | ICD-10-CM | POA: Diagnosis not present

## 2019-02-20 DIAGNOSIS — Z7951 Long term (current) use of inhaled steroids: Secondary | ICD-10-CM | POA: Diagnosis not present

## 2019-02-20 DIAGNOSIS — Z8349 Family history of other endocrine, nutritional and metabolic diseases: Secondary | ICD-10-CM | POA: Diagnosis not present

## 2019-02-20 DIAGNOSIS — S93432A Sprain of tibiofibular ligament of left ankle, initial encounter: Secondary | ICD-10-CM | POA: Diagnosis not present

## 2019-02-20 DIAGNOSIS — Z833 Family history of diabetes mellitus: Secondary | ICD-10-CM | POA: Diagnosis not present

## 2019-02-20 DIAGNOSIS — Z791 Long term (current) use of non-steroidal anti-inflammatories (NSAID): Secondary | ICD-10-CM | POA: Diagnosis not present

## 2019-02-20 DIAGNOSIS — K219 Gastro-esophageal reflux disease without esophagitis: Secondary | ICD-10-CM | POA: Diagnosis not present

## 2019-02-20 DIAGNOSIS — Z8261 Family history of arthritis: Secondary | ICD-10-CM | POA: Diagnosis not present

## 2019-02-20 DIAGNOSIS — E785 Hyperlipidemia, unspecified: Secondary | ICD-10-CM | POA: Diagnosis not present

## 2019-02-20 DIAGNOSIS — Z823 Family history of stroke: Secondary | ICD-10-CM | POA: Diagnosis not present

## 2019-02-20 DIAGNOSIS — Z86010 Personal history of colonic polyps: Secondary | ICD-10-CM | POA: Diagnosis not present

## 2019-02-20 DIAGNOSIS — I10 Essential (primary) hypertension: Secondary | ICD-10-CM | POA: Diagnosis not present

## 2019-02-20 DIAGNOSIS — Z79899 Other long term (current) drug therapy: Secondary | ICD-10-CM | POA: Diagnosis not present

## 2019-02-20 DIAGNOSIS — Z8249 Family history of ischemic heart disease and other diseases of the circulatory system: Secondary | ICD-10-CM | POA: Diagnosis not present

## 2019-02-20 DIAGNOSIS — R011 Cardiac murmur, unspecified: Secondary | ICD-10-CM | POA: Diagnosis not present

## 2019-02-20 DIAGNOSIS — Z85828 Personal history of other malignant neoplasm of skin: Secondary | ICD-10-CM | POA: Diagnosis not present

## 2019-02-20 DIAGNOSIS — S82899A Other fracture of unspecified lower leg, initial encounter for closed fracture: Secondary | ICD-10-CM | POA: Diagnosis not present

## 2019-02-20 DIAGNOSIS — M24072 Loose body in left ankle: Secondary | ICD-10-CM | POA: Diagnosis not present

## 2019-02-20 DIAGNOSIS — Z9049 Acquired absence of other specified parts of digestive tract: Secondary | ICD-10-CM | POA: Diagnosis not present

## 2019-02-20 DIAGNOSIS — Z9109 Other allergy status, other than to drugs and biological substances: Secondary | ICD-10-CM | POA: Diagnosis not present

## 2019-02-20 MED ORDER — GABAPENTIN 300 MG PO CAPS: 300.0000 mg | ORAL_CAPSULE | Freq: Three times a day (TID) | ORAL | 0 refills | Status: DC

## 2019-02-20 MED ORDER — METHOCARBAMOL 500 MG PO TABS: 500.0000 mg | ORAL_TABLET | Freq: Three times a day (TID) | ORAL | 0 refills | Status: AC | PRN

## 2019-02-20 MED ORDER — HYDROCODONE-ACETAMINOPHEN 5-325 MG PO TABS: 1.0000 | ORAL_TABLET | ORAL | 0 refills | Status: DC | PRN

## 2019-02-20 NOTE — Progress Notes (Signed)
Blood pressure was taken by PT, pt's blood pressure now 111/56. Notified Dr. Lucia Gaskins, will hold 500cc fluid bolus.

## 2019-02-20 NOTE — Op Note (Signed)
Chayne Zaniewski female 79 y.o. 02/19/2019   PreOperative Diagnosis: Left trimalleolar ankle fracture dislocation   PostOperative Diagnosis: Left trimalleolar ankle fracture dislocation Left syndesmotic disruption Intra-articular loose fragments, left ankle  PROCEDURE: Open reduction internal fixation of left trimalleolar ankle fracture without posterior fixation Open reduction internal fixation of left ankle syndesmosis Ankle arthrotomy with removal of loose bodies, left Intraoperative ankle stress view fluoroscopy  SURGEON: Melony Overly, MD  ASSISTANT: None  ANESTHESIA: General  FINDINGS: Comminuted medial malleolus fracture Left ankle intra-articular loose fragments without evidence of medial or talar dome chondral lesion  IMPLANTS: Arthrex distal fibular locking plate Arthrex 2.4 mm Y-shaped locking plate  [id number] y.o. female fell and sustained the above injury.  She had closed reduction performed in the emergency department with splinting.  She was sent to me for evaluation.  Given the amount of displacement and comminution about her fracture she was indicated for open treatment.  She understood the risk benefits alternatives surgery which include but not limited to wound healing complications, infection, nonunion, malunion, need for further surgery, development of arthritis and continued pain.  She also understood the perioperative and anesthetic risk which include death.  She wished to proceed with surgery.  PROCEDURE:Patient was identified the preoperative holding area.  The left leg was marked myself.  Consent was signed myself and the patient.  Patient was taken to the operative suite and placed supine on the operative table.  General anesthesia was induced out difficulty.  Preoperative antibiotics were given.  Thigh tourniquet was placed on the operative thigh and a bump was placed under the hip. Bone foam was used.  All bony prominences well-padded.   Surgical timeout was performed.  The operative extremity was then elevated and the tourniquet inflated to 250 mmHg.  We began by making a longitudinal incision overlying the distal fibula.  This was taken sharply down through skin and subcutaneous tissue.  Blunt dissection was used to identify any branch of the superficial peroneal nerve which was not identified within the surgical field.  The incision was then taken sharply down to bone and the fracture site was identified.  The fracture site was mobilized.   The fracture site were cleaned with a rondure and curette of any fracture hematoma and callus formation.  Then the fracture of the fibula was reduced under direct visualization and held provisionally with a lobster claw.  Then fluoroscopy confirmed adequate reduction of the ankle mortise at that time.  Then a combination of locking and nonlocking screws were used after placement of a lag screw across the fracture by technique.  This provided good stability of the distal fibula fracture.  After this the syndesmosis was inspected.  There is a large bony fragment of the anterior fibula at the attachment of the syndesmotic ligaments and there is syndesmosis instability noted.  Assessment was done under direct visualization.  Using a 2.4 mm cortical screw the bony fragment attached to the ligaments was reattached to the anterior portion of the fibula.  This was to recreate stability through the syndesmosis and perform the open reduction of the syndesmosis.  We then turned our attention to the medial malleolus.  There was continued displacement of the medial malleolus and therefore an incision was made overlying this.  This was taken sharply down through skin and subcutaneous tissue.  Bovie cautery was used for skin bleeders.  Then sharp dissection down to the fracture and the fracture site was identified.  There was comminution at the medial malleolus  fragment with a large fragment proximal and the articular  surface being separate.  Due to the comminution and concern for intra-articular loose fragments the soft tissue flap was created anteriorly about the medial gutter of the ankle.  The joint capsule tissue was identified and incised creating an arthrotomy.  The ankle joint was inspected and there was small pieces of bone and cartilage within the medial gutter.  These were removed with a rondure.  Then the ankle joint was irrigated.  The talar dome was inspected and found to have no evidence of cartilage damage. Then the fracture site was mobilized and using a curette and rondure the fracture was cleared out of hematoma and fracture callus.  Then the fracture was reduced and held provisionally with a K wire.  This was done under direct visualization.  Then fluoroscopy confirmed adequate reduction.  Then a small 2.4 mm Y-shaped locking plate was used in a buttress fashion about the fracture.  Combination of nonlocking and locking screws were used to fix the fracture site. Then under fluoroscopy the ankle was stressed and found to be stable with regard to medial clear space widening or syndesmotic widening.  The wounds were irrigated.  The tourniquet was released and hemostasis was obtained. The deep tissue was closed with a 3-0 Monocryl stitch the subcuticular tissue was closed with 3-0 Monocryl and the skin with staples.  Xeroform placed on the wounds as well as 4 x 4's and sterile sheet cotton.  She was placed in a nonweightbearing short leg splint.  She tolerated this well.  There were no complications.  She was awakened from anesthesia and taken recovery in stable condition.  POST OPERATIVE INSTRUCTIONS: Nonweightbearing to right lower extremity Keep splint dry and limb elevated Continue 325 mg aspirin for DVT prophylaxis Call the office with concerns She will follow-up in 2 weeks for splint removal, x-rays of the right ankle nonweightbearing and suture removal if appropriate.  BLOOD LOSS:  Minimal          DRAINS: none         SPECIMEN: none       COMPLICATIONS:  * No complications entered in OR log *         Disposition: PACU - hemodynamically stable.         Condition: stable

## 2019-02-20 NOTE — Progress Notes (Signed)
Physical Therapy Treatment Patient Details Name: Joan Mclaughlin MRN: [id number] DOB: February 12, 1940 Today's Date: 02/20/2019    History of Present Illness Pt is an 79 y/o female s/p ORIF of L ankle fx. PMH includes anxiety, HTN, and seizure.     PT Comments    Pt OOB in recliner upon arrival, agreeable to PT session at this time focused on mobility progression and stair training. The pt continues to present with limitations in functional mobility, power, and endurance compared to her prior level of function and independence due to above dx. However, the pt was able to demo improvements in ambulation endurance as well as good safety and technique with stair navigation. The pt will continue to benefit from skilled PT to facilitate return to prior level of independence following return home.     Follow Up Recommendations  Supervision/Assistance - 24 hour;Home health PT(24/7 supervision initially)     Equipment Recommendations  (pt has RW she needs)    Recommendations for Other Services       Precautions / Restrictions Precautions Precautions: Fall Restrictions Weight Bearing Restrictions: Yes LLE Weight Bearing: Non weight bearing    Mobility  Bed Mobility Overal bed mobility: Modified Independent             General bed mobility comments: pt OOB in recliner upon arrival  Transfers Overall transfer level: Needs assistance Equipment used: Rolling walker (2 wheeled) Transfers: Sit to/from Stand Sit to Stand: Supervision         General transfer comment: pt able to stand from recliner x3 as well as come to partial stand for dressing with supervision only  Ambulation/Gait Ambulation/Gait assistance: Supervision Gait Distance (Feet): 100 Feet Assistive device: Rolling walker (2 wheeled)   Gait velocity: decreased Gait velocity interpretation: <1.8 ft/sec, indicate of risk for recurrent falls General Gait Details: reliant on BUE and RW due to NWB LLE, swing-to  pattern   Stairs Stairs: Yes Stairs assistance: Min guard Stair Management: No rails;Backwards;With walker Number of Stairs: 1(x3) General stair comments: pt able to demo 1 step x3 with use of RW and min guard for safety. discussed how to advise people to help her. pt with good technique and no questions   Wheelchair Mobility    Modified Rankin (Stroke Patients Only)       Balance Overall balance assessment: Needs assistance Sitting-balance support: Feet supported;No upper extremity supported Sitting balance-Leahy Scale: Good Sitting balance - Comments: independent static sitting   Standing balance support: Bilateral upper extremity supported Standing balance-Leahy Scale: Poor Standing balance comment: reliant on RW                            Cognition Arousal/Alertness: Awake/alert Behavior During Therapy: WFL for tasks assessed/performed Overall Cognitive Status: Within Functional Limits for tasks assessed Area of Impairment: Memory                     Memory: Decreased short-term memory         General Comments: pt is particular, but agreeable to therapy and recommendations      Exercises      General Comments General comments (skin integrity, edema, etc.): able to wiggle toes in cast L LE      Pertinent Vitals/Pain Pain Assessment: Faces Faces Pain Scale: Hurts a little bit Pain Location: L ankle  Pain Descriptors / Indicators: Aching;Operative site guarding Pain Intervention(s): Limited activity within patient's tolerance;Monitored during session  Home Living Family/patient expects to be discharged to:: Private residence Living Arrangements: Alone Available Help at Discharge: Family Type of Home: House Home Access: Stairs to enter   Home Layout: One level Home Equipment: Environmental consultant - 2 wheels;Bedside commode;Shower seat Additional Comments: son will arrive Feb 15, elevated bed surface    Prior Function Level of Independence:  Independent with assistive device(s)      Comments: reports she was using RW for household distances.    PT Goals (current goals can now be found in the care plan section) Acute Rehab PT Goals Patient Stated Goal: to get back to normal PT Goal Formulation: With patient Time For Goal Achievement: 03/05/19 Potential to Achieve Goals: Good Progress towards PT goals: Progressing toward goals    Frequency    Min 5X/week      PT Plan Current plan remains appropriate    Co-evaluation              AM-PAC PT "6 Clicks" Mobility   Outcome Measure  Help needed turning from your back to your side while in a flat bed without using bedrails?: A Little Help needed moving from lying on your back to sitting on the side of a flat bed without using bedrails?: A Little Help needed moving to and from a bed to a chair (including a wheelchair)?: A Little Help needed standing up from a chair using your arms (e.g., wheelchair or bedside chair)?: A Little Help needed to walk in hospital room?: A Little Help needed climbing 3-5 steps with a railing? : A Little 6 Click Score: 18    End of Session   Activity Tolerance: Patient tolerated treatment well Patient left: in chair;with call bell/phone within reach Nurse Communication: Mobility status PT Visit Diagnosis: Other abnormalities of gait and mobility (R26.89);Difficulty in walking, not elsewhere classified (R26.2);Pain Pain - Right/Left: Left Pain - part of body: Ankle and joints of foot     Time: [id number] PT Time Calculation (min) (ACUTE ONLY): 32 min  Charges:  $Gait Training: 23-37 mins                     Karma Ganja, PT, DPT   Acute Rehabilitation Department Pager #: [phone]   Otho Bellows 02/20/2019, 10:49 AM

## 2019-02-20 NOTE — Progress Notes (Signed)
Discharge paperwork and instructions given to pt. Pt not in distress and tolerated well. 

## 2019-02-20 NOTE — Progress Notes (Signed)
Dr. Lucia Gaskins notified about pt's blood pressure 89/45 and pulse 61. 500cc bolus ordered.

## 2019-02-20 NOTE — Evaluation (Signed)
Occupational Therapy Evaluation Patient Details Name: Joan Mclaughlin MRN: [id number] DOB: May 02, 1940 Today's Date: 02/20/2019    History of Present Illness Pt is an 79 y/o female s/p ORIF of L ankle fx. PMH includes anxiety, HTN, and seizure.    Clinical Impression   Patient is s/p ORIF L ankle surgery resulting in functional limitations due to the deficits listed below (see OT problem list). Pt currently without symptoms closely monitored and BP 111/56 ( MAP 74). Pt will have sister (A) for tonight and son arrives to help her Feb 15th. Pt min guard (A) with RW and cues for safety with hand placement. Patient will benefit from skilled OT acutely to increase independence and safety with ADLS to allow discharge home.     Follow Up Recommendations  No OT follow up    Equipment Recommendations  None recommended by OT    Recommendations for Other Services       Precautions / Restrictions Precautions Precautions: Fall Restrictions Weight Bearing Restrictions: Yes LLE Weight Bearing: Non weight bearing      Mobility Bed Mobility Overal bed mobility: Modified Independent             General bed mobility comments: HOB was elevated > 40 degrees  Transfers Overall transfer level: Needs assistance Equipment used: Rolling walker (2 wheeled) Transfers: Sit to/from [facility] Sit to Stand: Min guard;From elevated surface         General transfer comment: bed elevated to simulate home height. pt able to complete with cues for hand placement for safety    Balance Overall balance assessment: Needs assistance         Standing balance support: Bilateral upper extremity supported Standing balance-Leahy Scale: Poor Standing balance comment: reliant on RW                           ADL either performed or assessed with clinical judgement   ADL Overall ADL's : Needs assistance/impaired Eating/Feeding: Independent   Grooming: Wash/dry  hands               Lower Body Dressing: Maximal assistance   Toilet Transfer: Min guard;RW;BSC   Toileting- Clothing Manipulation and Hygiene: Min guard;Sitting/lateral lean       Functional mobility during ADLs: Min guard;Rolling walker General ADL Comments: pt requires cues to back up fully to surfaces and to reach back with hands for safety     Vision   Vision Assessment?: No apparent visual deficits     Perception     Praxis      Pertinent Vitals/Pain Pain Assessment: No/denies pain     Hand Dominance Right   Extremity/Trunk Assessment Upper Extremity Assessment Upper Extremity Assessment: Overall WFL for tasks assessed   Lower Extremity Assessment Lower Extremity Assessment: Defer to PT evaluation   Cervical / Trunk Assessment Cervical / Trunk Assessment: Normal   Communication Communication Communication: HOH   Cognition Arousal/Alertness: Awake/alert Behavior During Therapy: WFL for tasks assessed/performed Overall Cognitive Status: Impaired/Different from baseline Area of Impairment: Memory                     Memory: Decreased short-term memory         General Comments: pt does best with 1 step instructions   General Comments  able to wiggle toes in cast L LE    Exercises     Shoulder Instructions      Home Living Family/patient expects  to be discharged to:: Private residence Living Arrangements: Alone Available Help at Discharge: Family Type of Home: House Home Access: Stairs to enter Technical brewer of Steps: 3   [place]: One level     Bathroom Shower/Tub: Tub/shower unit(garden tub)   Bathroom Toilet: [place]: Environmental consultant - 2 wheels;Bedside commode;Shower seat   Additional Comments: son will arrive Feb 15, elevated bed surface      Prior Functioning/Environment Level of Independence: Independent with assistive device(s)        Comments: reports she was using RW for household  distances.         OT Problem List: Decreased strength;Decreased activity tolerance;Impaired balance (sitting and/or standing);Decreased knowledge of use of DME or AE;Decreased knowledge of precautions;Cardiopulmonary status limiting activity;Pain;Decreased cognition      OT Treatment/Interventions: Self-care/ADL training;Therapeutic exercise;DME and/or AE instruction;Energy conservation;Manual therapy;Therapeutic activities;Cognitive remediation/compensation;Patient/family education;Balance training    OT Goals(Current goals can be found in the care plan section) Acute Rehab OT Goals Patient Stated Goal: to get back to normal OT Goal Formulation: With patient Time For Goal Achievement: 03/06/19 Potential to Achieve Goals: Good  OT Frequency: Min 2X/week   Barriers to D/C: Decreased caregiver support  lives alone, has sister for tonight at [employer] and a friend for tomorrow night. Pt will be home alone on 14th for part of the day with no caregiver       Co-evaluation              AM-PAC OT "6 Clicks" Daily Activity     Outcome Measure Help from another person eating meals?: None Help from another person taking care of personal grooming?: None Help from another person toileting, which includes using toliet, bedpan, or urinal?: A Little Help from another person bathing (including washing, rinsing, drying)?: A Little Help from another person to put on and taking off regular upper body clothing?: None Help from another person to put on and taking off regular lower body clothing?: A Little 6 Click Score: 21   End of Session Equipment Utilized During Treatment: Rolling walker Nurse Communication: Mobility status;Precautions;Weight bearing status  Activity Tolerance: Patient tolerated treatment well Patient left: in chair;with call bell/phone within reach;with nursing/sitter in room  OT Visit Diagnosis: Unsteadiness on feet (R26.81);Muscle weakness (generalized) (M62.81)                 Time: [id number] OT Time Calculation (min): 41 min Charges:  OT General Charges $OT Visit: 1 Visit OT Evaluation $OT Eval Moderate Complexity: 1 Mod OT Treatments $Self Care/Home Management : 23-37 mins   Brynn, OTR/L  Acute Rehabilitation Services Pager: [phone] Office: [phone] .   Jeri Modena 02/20/2019, 9:34 AM

## 2019-02-20 NOTE — Progress Notes (Signed)
     Joan Mclaughlin is a 79 y.o. female   Orthopaedic diagnosis: Left trimalleolar ankle fracture dislocation with syndesmotic disruption status post open treatment on 12/19/2019  Subjective: Patient is resting comfortably.  She denies pain.  She has been able to use the bedside commode and worked with physical therapy yesterday.  She states her sister is going to be staying with her and her son is coming to stay with her to help at home.  She is hopeful to be suitable for discharge home.  She denies symptomatic numbness or tingling.  Objectyive: Vitals:   02/19/19 2350 02/20/19 0533  BP: (!) 109/51 (!) 103/44  Pulse: 67 63  Resp: 16 16  Temp: 98 F (36.7 C) (!) 97.3 F (36.3 C)  SpO2: 96% 100%     Exam: Awake and alert Respirations even and unlabored No acute distress  Left leg in a short leg splint.  Toes exposed warm and well-perfused.  Stable wiggle toes.  Endorses sensation about the toes.  No tenderness proximal to the splint.  Assessment: Postop day 1 status post open treatment of left trimalleolar ankle fracture and syndesmosis, doing well   Plan: Patient will work with physical therapy this morning for mobilization and for DME equipment recommendations.  She is suitable for discharge home once cleared by physical therapy.  She will finished perioperative antibiotics. Pain control. She will follow-up with me in 2 weeks for splint removal, wound check and suture removal if appropriate.   Radene Journey, MD

## 2019-02-20 NOTE — Plan of Care (Signed)
  Problem: Safety: Goal: Ability to remain free from injury will improve Outcome: Progressing   

## 2019-02-20 NOTE — TOC Initial Note (Signed)
Transition of Care [facility]) - Initial/Assessment Note    Patient Details  Name: Joan Mclaughlin MRN: [id number] Date of Birth: 1940-08-04  [facility] CM/SW Contact:    Marilu Favre, RN Phone Number: 02/20/2019, 12:29 PM  Clinical Narrative:                 Confirmed face sheet information with patient and sister at bedside.   Sister plans on staying with patient 24/7 at discharge. Patient has a walker at home already. Patient requesting 3 in 1 , ordered with Zack with [facility].  Offered choice for home health , top 3 choices are 1, [facility], 2 [facility] , 3 [facility] .   Called Mateo Flow with [facility] she is checking to see if she can accept referral.  Expected Discharge Plan: [place] Barriers to Discharge: No Barriers Identified   Patient Goals and CMS Choice Patient states their goals for this hospitalization and ongoing recovery are:: to go home CMS Medicare.gov Compare Post Acute Care list provided to:: Patient Choice offered to / list presented to : Patient, Sibling  Expected Discharge Plan and Services Expected Discharge Plan: [place] Acute Care Choice: Home Health, Durable Medical Equipment Living arrangements for the past 2 months: Apartment Expected Discharge Date: 02/20/19               DME Arranged: 3-N-1 DME Agency: [facility] Date DME Agency Contacted: 02/20/19 Time DME Agency Contacted: 1229 Representative spoke with at DME Agency: [place]: PT          Prior Living Arrangements/Services Living arrangements for the past 2 months: Apartment Lives with:: Self Patient language and need for interpreter reviewed:: Yes Do you feel safe going back to the place where you live?: Yes      Need for Family Participation in Patient Care: Yes (Comment) Care giver support system in place?: Yes (comment) Current home services: DME Criminal Activity/Legal Involvement Pertinent to  Current Situation/Hospitalization: No - Comment as needed  Activities of Daily Living Home Assistive Devices/Equipment: Eyeglasses ADL Screening (condition at time of admission) Patient's cognitive ability adequate to safely complete daily activities?: Yes Is the patient deaf or have difficulty hearing?: No Does the patient have difficulty seeing, even when wearing glasses/contacts?: No Does the patient have difficulty concentrating, remembering, or making decisions?: No Patient able to express need for assistance with ADLs?: Yes Does the patient have difficulty dressing or bathing?: No Independently performs ADLs?: Yes (appropriate for developmental age) Does the patient have difficulty walking or climbing stairs?: Yes Weakness of Legs: None Weakness of Arms/Hands: None  Permission Sought/Granted   Permission granted to share information with : No              Emotional Assessment Appearance:: Appears younger than stated age Attitude/Demeanor/Rapport: Engaged Affect (typically observed): Accepting Orientation: : Oriented to Self, Oriented to Place, Oriented to  Time, Oriented to Situation Alcohol / Substance Use: Not Applicable Psych Involvement: No (comment)  Admission diagnosis:  Ankle fracture [id number] Patient Active Problem List   Diagnosis Date Noted  . Ankle fracture 02/19/2019  . Essential hypertension 10/14/2018  . Abnormal colonoscopy 09/18/2018  . History of colonic polyps 09/16/2018  . Tubular adenoma of colon 06/27/2018  . Dyslipidemia 03/06/2018  . Allergic rhinitis 03/05/2018  . History of cold sores 03/05/2018  . 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 Calculus of gallbladder without cholecystitis without  obstruction 01/14/2017  . Constipation 01/14/2017  . Localization-related idiopathic epilepsy and epileptic syndromes with seizures of localized onset, not intractable, without status epilepticus ([place]) 03/02/2015  . Insomnia 05/02/2013    PCP:  Vivi Barrack, MD Pharmacy:   [facility] [place], [place] - [address] AT [address] [place] [address] Phone: [phone] Fax: [phone]     Social Determinants of Health (SDOH) Interventions    Readmission Risk Interventions No flowsheet data found.

## 2019-02-20 NOTE — Plan of Care (Signed)
  Problem: Safety: Goal: Ability to remain free from injury will improve Outcome: Progressing   Problem: Pain Managment: Goal: General experience of comfort will improve Outcome: Progressing   Problem: Elimination: Goal: Will not experience complications related to bowel motility Outcome: Progressing   

## 2019-02-21 DIAGNOSIS — D126 Benign neoplasm of colon, unspecified: Secondary | ICD-10-CM | POA: Diagnosis not present

## 2019-02-21 DIAGNOSIS — K219 Gastro-esophageal reflux disease without esophagitis: Secondary | ICD-10-CM | POA: Diagnosis not present

## 2019-02-21 DIAGNOSIS — Z79891 Long term (current) use of opiate analgesic: Secondary | ICD-10-CM | POA: Diagnosis not present

## 2019-02-21 DIAGNOSIS — K5792 Diverticulitis of intestine, part unspecified, without perforation or abscess without bleeding: Secondary | ICD-10-CM | POA: Diagnosis not present

## 2019-02-21 DIAGNOSIS — Z9181 History of falling: Secondary | ICD-10-CM | POA: Diagnosis not present

## 2019-02-21 DIAGNOSIS — Z7982 Long term (current) use of aspirin: Secondary | ICD-10-CM | POA: Diagnosis not present

## 2019-02-21 DIAGNOSIS — S82852D Displaced trimalleolar fracture of left lower leg, subsequent encounter for closed fracture with routine healing: Secondary | ICD-10-CM | POA: Diagnosis not present

## 2019-02-21 DIAGNOSIS — C4490 Unspecified malignant neoplasm of skin, unspecified: Secondary | ICD-10-CM | POA: Diagnosis not present

## 2019-02-21 DIAGNOSIS — E785 Hyperlipidemia, unspecified: Secondary | ICD-10-CM | POA: Diagnosis not present

## 2019-02-21 DIAGNOSIS — I10 Essential (primary) hypertension: Secondary | ICD-10-CM | POA: Diagnosis not present

## 2019-02-21 DIAGNOSIS — W19XXXD Unspecified fall, subsequent encounter: Secondary | ICD-10-CM | POA: Diagnosis not present

## 2019-02-25 DIAGNOSIS — K5792 Diverticulitis of intestine, part unspecified, without perforation or abscess without bleeding: Secondary | ICD-10-CM | POA: Diagnosis not present

## 2019-02-25 DIAGNOSIS — S82852D Displaced trimalleolar fracture of left lower leg, subsequent encounter for closed fracture with routine healing: Secondary | ICD-10-CM | POA: Diagnosis not present

## 2019-02-25 DIAGNOSIS — Z79891 Long term (current) use of opiate analgesic: Secondary | ICD-10-CM | POA: Diagnosis not present

## 2019-02-25 DIAGNOSIS — D126 Benign neoplasm of colon, unspecified: Secondary | ICD-10-CM | POA: Diagnosis not present

## 2019-02-25 DIAGNOSIS — Z9181 History of falling: Secondary | ICD-10-CM | POA: Diagnosis not present

## 2019-02-25 DIAGNOSIS — I10 Essential (primary) hypertension: Secondary | ICD-10-CM | POA: Diagnosis not present

## 2019-02-25 DIAGNOSIS — K219 Gastro-esophageal reflux disease without esophagitis: Secondary | ICD-10-CM | POA: Diagnosis not present

## 2019-02-25 DIAGNOSIS — W19XXXD Unspecified fall, subsequent encounter: Secondary | ICD-10-CM | POA: Diagnosis not present

## 2019-02-25 DIAGNOSIS — C4490 Unspecified malignant neoplasm of skin, unspecified: Secondary | ICD-10-CM | POA: Diagnosis not present

## 2019-02-25 DIAGNOSIS — Z7982 Long term (current) use of aspirin: Secondary | ICD-10-CM | POA: Diagnosis not present

## 2019-02-25 DIAGNOSIS — E785 Hyperlipidemia, unspecified: Secondary | ICD-10-CM | POA: Diagnosis not present

## 2019-02-27 DIAGNOSIS — D126 Benign neoplasm of colon, unspecified: Secondary | ICD-10-CM | POA: Diagnosis not present

## 2019-02-27 DIAGNOSIS — C4490 Unspecified malignant neoplasm of skin, unspecified: Secondary | ICD-10-CM | POA: Diagnosis not present

## 2019-02-27 DIAGNOSIS — Z9181 History of falling: Secondary | ICD-10-CM | POA: Diagnosis not present

## 2019-02-27 DIAGNOSIS — K219 Gastro-esophageal reflux disease without esophagitis: Secondary | ICD-10-CM | POA: Diagnosis not present

## 2019-02-27 DIAGNOSIS — I10 Essential (primary) hypertension: Secondary | ICD-10-CM | POA: Diagnosis not present

## 2019-02-27 DIAGNOSIS — W19XXXD Unspecified fall, subsequent encounter: Secondary | ICD-10-CM | POA: Diagnosis not present

## 2019-02-27 DIAGNOSIS — Z7982 Long term (current) use of aspirin: Secondary | ICD-10-CM | POA: Diagnosis not present

## 2019-02-27 DIAGNOSIS — E785 Hyperlipidemia, unspecified: Secondary | ICD-10-CM | POA: Diagnosis not present

## 2019-02-27 DIAGNOSIS — S82852D Displaced trimalleolar fracture of left lower leg, subsequent encounter for closed fracture with routine healing: Secondary | ICD-10-CM | POA: Diagnosis not present

## 2019-02-27 DIAGNOSIS — K5792 Diverticulitis of intestine, part unspecified, without perforation or abscess without bleeding: Secondary | ICD-10-CM | POA: Diagnosis not present

## 2019-02-27 DIAGNOSIS — Z79891 Long term (current) use of opiate analgesic: Secondary | ICD-10-CM | POA: Diagnosis not present

## 2019-03-02 DIAGNOSIS — Z7982 Long term (current) use of aspirin: Secondary | ICD-10-CM | POA: Diagnosis not present

## 2019-03-02 DIAGNOSIS — K5792 Diverticulitis of intestine, part unspecified, without perforation or abscess without bleeding: Secondary | ICD-10-CM | POA: Diagnosis not present

## 2019-03-02 DIAGNOSIS — I10 Essential (primary) hypertension: Secondary | ICD-10-CM | POA: Diagnosis not present

## 2019-03-02 DIAGNOSIS — S82852D Displaced trimalleolar fracture of left lower leg, subsequent encounter for closed fracture with routine healing: Secondary | ICD-10-CM | POA: Diagnosis not present

## 2019-03-02 DIAGNOSIS — E785 Hyperlipidemia, unspecified: Secondary | ICD-10-CM | POA: Diagnosis not present

## 2019-03-02 DIAGNOSIS — C4490 Unspecified malignant neoplasm of skin, unspecified: Secondary | ICD-10-CM | POA: Diagnosis not present

## 2019-03-02 DIAGNOSIS — K219 Gastro-esophageal reflux disease without esophagitis: Secondary | ICD-10-CM | POA: Diagnosis not present

## 2019-03-02 DIAGNOSIS — D126 Benign neoplasm of colon, unspecified: Secondary | ICD-10-CM | POA: Diagnosis not present

## 2019-03-02 DIAGNOSIS — Z9181 History of falling: Secondary | ICD-10-CM | POA: Diagnosis not present

## 2019-03-02 DIAGNOSIS — Z79891 Long term (current) use of opiate analgesic: Secondary | ICD-10-CM | POA: Diagnosis not present

## 2019-03-02 DIAGNOSIS — W19XXXD Unspecified fall, subsequent encounter: Secondary | ICD-10-CM | POA: Diagnosis not present

## 2019-03-05 DIAGNOSIS — I10 Essential (primary) hypertension: Secondary | ICD-10-CM | POA: Diagnosis not present

## 2019-03-05 DIAGNOSIS — C4490 Unspecified malignant neoplasm of skin, unspecified: Secondary | ICD-10-CM | POA: Diagnosis not present

## 2019-03-05 DIAGNOSIS — Z79891 Long term (current) use of opiate analgesic: Secondary | ICD-10-CM | POA: Diagnosis not present

## 2019-03-05 DIAGNOSIS — S82852D Displaced trimalleolar fracture of left lower leg, subsequent encounter for closed fracture with routine healing: Secondary | ICD-10-CM | POA: Diagnosis not present

## 2019-03-05 DIAGNOSIS — W19XXXD Unspecified fall, subsequent encounter: Secondary | ICD-10-CM | POA: Diagnosis not present

## 2019-03-05 DIAGNOSIS — Z9181 History of falling: Secondary | ICD-10-CM | POA: Diagnosis not present

## 2019-03-05 DIAGNOSIS — E785 Hyperlipidemia, unspecified: Secondary | ICD-10-CM | POA: Diagnosis not present

## 2019-03-05 DIAGNOSIS — D126 Benign neoplasm of colon, unspecified: Secondary | ICD-10-CM | POA: Diagnosis not present

## 2019-03-05 DIAGNOSIS — K5792 Diverticulitis of intestine, part unspecified, without perforation or abscess without bleeding: Secondary | ICD-10-CM | POA: Diagnosis not present

## 2019-03-05 DIAGNOSIS — Z7982 Long term (current) use of aspirin: Secondary | ICD-10-CM | POA: Diagnosis not present

## 2019-03-05 DIAGNOSIS — K219 Gastro-esophageal reflux disease without esophagitis: Secondary | ICD-10-CM | POA: Diagnosis not present

## 2019-03-06 DIAGNOSIS — S82842A Displaced bimalleolar fracture of left lower leg, initial encounter for closed fracture: Secondary | ICD-10-CM | POA: Diagnosis not present

## 2019-03-17 ENCOUNTER — Ambulatory Visit: Payer: Medicare Other | Admitting: Family Medicine

## 2019-04-13 ENCOUNTER — Encounter: Payer: Self-pay | Admitting: Family Medicine

## 2019-04-13 ENCOUNTER — Ambulatory Visit (INDEPENDENT_AMBULATORY_CARE_PROVIDER_SITE_OTHER): Payer: Medicare Other | Admitting: Family Medicine

## 2019-04-13 ENCOUNTER — Telehealth: Payer: Self-pay

## 2019-04-13 ENCOUNTER — Other Ambulatory Visit: Payer: Self-pay

## 2019-04-13 VITALS — Ht 62.0 in | Wt 146.0 lb

## 2019-04-13 DIAGNOSIS — G4700 Insomnia, unspecified: Secondary | ICD-10-CM | POA: Diagnosis not present

## 2019-04-13 MED ORDER — ALPRAZOLAM 1 MG PO TABS: 0.5000 mg | ORAL_TABLET | Freq: Every evening | ORAL | 5 refills | Status: DC | PRN

## 2019-04-13 NOTE — Assessment & Plan Note (Signed)
Database with no red flags.  She has tried and failed hydroxyzine and trazodone.  She has been on Xanax for several years.  Will increase dose to 0.5 mg nightly.  She will follow me in a couple weeks.  If no improvement, would consider trial of Remeron versus referral to sleep medicine.  Will avoid high dose of benzos if  At all possible.

## 2019-04-13 NOTE — Progress Notes (Signed)
   Joan Mclaughlin is a 79 y.o. female who presents today for a telephone visit.  Assessment/Plan:  Chronic Problems Addressed Today: Insomnia Database with no red flags.  She has tried and failed hydroxyzine and trazodone.  She has been on Xanax for several years.  Will increase dose to 0.5 mg nightly.  She will follow me in a couple weeks.  If no improvement, would consider trial of Remeron versus referral to sleep medicine.  Will avoid high dose of benzos if  At all possible.     Subjective:  HPI:  Patient here for follow-up today.  Last seen about 6 months ago.  She is unfortunately suffered an ankle fracture since her last visit has been followed with orthopedics.  She has had ongoing issues with sleep difficulty.  She has been on several medications in the past including Xanax, hydroxyzine, and trazodone.  Is having more issues lately.  She is interested in increasing dose of Xanax.  Hydroxyzine and trazodone did not seem to work.        Objective/Observations   NAD  Telephone Visit   I connected with Joan Mclaughlin on 04/13/19 at 11:20 AM EDT via telephone and verified that I am speaking with the correct person using two identifiers. I discussed the limitations of evaluation and management by telemedicine and the availability of in person appointments. The patient expressed understanding and agreed to proceed.   Patient location: Home Provider location: [place] participating in the virtual visit: Myself and Patient  A total of 11 minutes were spent on medical discussion.      [name]. [name], MD 04/13/2019 10:43 AM

## 2019-04-13 NOTE — Telephone Encounter (Signed)
Phone call to patient to schedule 21minute surgery with [facility]. Appointment made for 06/03/2019 @3 :00pm.

## 2019-04-15 DIAGNOSIS — S82842A Displaced bimalleolar fracture of left lower leg, initial encounter for closed fracture: Secondary | ICD-10-CM | POA: Diagnosis not present

## 2019-04-21 DIAGNOSIS — S82852E Displaced trimalleolar fracture of left lower leg, subsequent encounter for open fracture type I or II with routine healing: Secondary | ICD-10-CM | POA: Diagnosis not present

## 2019-04-21 DIAGNOSIS — M25672 Stiffness of left ankle, not elsewhere classified: Secondary | ICD-10-CM | POA: Diagnosis not present

## 2019-04-22 DIAGNOSIS — M25672 Stiffness of left ankle, not elsewhere classified: Secondary | ICD-10-CM | POA: Diagnosis not present

## 2019-04-22 DIAGNOSIS — S82852E Displaced trimalleolar fracture of left lower leg, subsequent encounter for open fracture type I or II with routine healing: Secondary | ICD-10-CM | POA: Diagnosis not present

## 2019-04-28 DIAGNOSIS — S82852E Displaced trimalleolar fracture of left lower leg, subsequent encounter for open fracture type I or II with routine healing: Secondary | ICD-10-CM | POA: Diagnosis not present

## 2019-04-28 DIAGNOSIS — M25672 Stiffness of left ankle, not elsewhere classified: Secondary | ICD-10-CM | POA: Diagnosis not present

## 2019-04-29 ENCOUNTER — Other Ambulatory Visit: Payer: Self-pay | Admitting: Physician Assistant

## 2019-04-29 DIAGNOSIS — S82852E Displaced trimalleolar fracture of left lower leg, subsequent encounter for open fracture type I or II with routine healing: Secondary | ICD-10-CM | POA: Diagnosis not present

## 2019-04-29 DIAGNOSIS — M25672 Stiffness of left ankle, not elsewhere classified: Secondary | ICD-10-CM | POA: Diagnosis not present

## 2019-04-30 ENCOUNTER — Telehealth: Payer: Self-pay | Admitting: Gastroenterology

## 2019-04-30 NOTE — Telephone Encounter (Signed)
Pt stated that she has broken her ankle but would like to schedule recall hosp colon some time after May.

## 2019-04-30 NOTE — Telephone Encounter (Signed)
The pt has been advised that she can call at her convenience to set up colonoscopy  The pt has been advised of the information and verbalized understanding.

## 2019-05-05 DIAGNOSIS — S82852E Displaced trimalleolar fracture of left lower leg, subsequent encounter for open fracture type I or II with routine healing: Secondary | ICD-10-CM | POA: Diagnosis not present

## 2019-05-05 DIAGNOSIS — M25672 Stiffness of left ankle, not elsewhere classified: Secondary | ICD-10-CM | POA: Diagnosis not present

## 2019-05-06 ENCOUNTER — Encounter: Payer: Self-pay | Admitting: *Deleted

## 2019-05-06 DIAGNOSIS — M25672 Stiffness of left ankle, not elsewhere classified: Secondary | ICD-10-CM | POA: Diagnosis not present

## 2019-05-06 DIAGNOSIS — S82852E Displaced trimalleolar fracture of left lower leg, subsequent encounter for open fracture type I or II with routine healing: Secondary | ICD-10-CM | POA: Diagnosis not present

## 2019-05-07 ENCOUNTER — Encounter: Payer: Self-pay | Admitting: Physician Assistant

## 2019-05-07 ENCOUNTER — Telehealth: Payer: Self-pay | Admitting: Physician Assistant

## 2019-05-07 ENCOUNTER — Ambulatory Visit: Payer: Medicare Other | Admitting: Physician Assistant

## 2019-05-07 ENCOUNTER — Other Ambulatory Visit: Payer: Self-pay

## 2019-05-07 DIAGNOSIS — C4492 Squamous cell carcinoma of skin, unspecified: Secondary | ICD-10-CM

## 2019-05-07 DIAGNOSIS — L219 Seborrheic dermatitis, unspecified: Secondary | ICD-10-CM

## 2019-05-07 DIAGNOSIS — C44622 Squamous cell carcinoma of skin of right upper limb, including shoulder: Secondary | ICD-10-CM | POA: Diagnosis not present

## 2019-05-07 MED ORDER — KETOCONAZOLE 2 % EX SHAM: 1.0000 "application " | MEDICATED_SHAMPOO | Freq: Once | CUTANEOUS | 4 refills | Status: AC

## 2019-05-07 NOTE — Telephone Encounter (Signed)
Patient wants to know if clobetasol that she was given for scalp is ok to use on ears and neck?

## 2019-05-07 NOTE — Telephone Encounter (Signed)
I would rather her use the hydrocortisone 2.5 % for the most part but if she needs to use the clobetasol on occasion she can.

## 2019-05-07 NOTE — Progress Notes (Addendum)
   Follow up Visit  Subjective  Joan Mclaughlin is a 79 y.o. female who presents for the following: Rosacea (Breaking out across her face.  Feels like it has been getting worse.  Using hydrocortisone 2.5% on her face.  Itching is intense at times.). She has had an issue like this in the past on her scalp and Kelli gave her ketoconazole shampoo and clobetasol solution. She is out of the shampoo. She also had a SCC biopsied and was unable to come for her surgery as she fell and broke her ankle. She has an appt for that at the end of May.  Objective  Well appearing patient in no apparent distress; mood and affect are within normal limits.  A focused examination was performed including face, scalp and right forearm.. Relevant physical exam findings are noted in the Assessment and Plan.   Objective  Head - Anterior (Face) (2): Erythematous plaques with greasy scale.   Objective  Right Forearm - Posterior: Biopsy scar identified. No scale or papule. Grey discoloration noted at site.   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   I have reviewed the above documentation for accuracy and completeness and I agree with the above. Arlyss Gandy, PA-C

## 2019-05-11 DIAGNOSIS — S82852E Displaced trimalleolar fracture of left lower leg, subsequent encounter for open fracture type I or II with routine healing: Secondary | ICD-10-CM | POA: Diagnosis not present

## 2019-05-11 DIAGNOSIS — M25672 Stiffness of left ankle, not elsewhere classified: Secondary | ICD-10-CM | POA: Diagnosis not present

## 2019-05-13 DIAGNOSIS — M25672 Stiffness of left ankle, not elsewhere classified: Secondary | ICD-10-CM | POA: Diagnosis not present

## 2019-05-13 DIAGNOSIS — S82852E Displaced trimalleolar fracture of left lower leg, subsequent encounter for open fracture type I or II with routine healing: Secondary | ICD-10-CM | POA: Diagnosis not present

## 2019-05-18 DIAGNOSIS — M25672 Stiffness of left ankle, not elsewhere classified: Secondary | ICD-10-CM | POA: Diagnosis not present

## 2019-05-18 DIAGNOSIS — S82852E Displaced trimalleolar fracture of left lower leg, subsequent encounter for open fracture type I or II with routine healing: Secondary | ICD-10-CM | POA: Diagnosis not present

## 2019-05-26 DIAGNOSIS — M25672 Stiffness of left ankle, not elsewhere classified: Secondary | ICD-10-CM | POA: Diagnosis not present

## 2019-05-26 DIAGNOSIS — S82852E Displaced trimalleolar fracture of left lower leg, subsequent encounter for open fracture type I or II with routine healing: Secondary | ICD-10-CM | POA: Diagnosis not present

## 2019-05-27 DIAGNOSIS — S82852D Displaced trimalleolar fracture of left lower leg, subsequent encounter for closed fracture with routine healing: Secondary | ICD-10-CM | POA: Diagnosis not present

## 2019-05-27 NOTE — Telephone Encounter (Signed)
Phone call to patient to give her Anderson [facility] Clark-Bruning's recommendations.  Patient aware.

## 2019-06-03 ENCOUNTER — Other Ambulatory Visit: Payer: Self-pay

## 2019-06-03 ENCOUNTER — Encounter: Payer: Self-pay | Admitting: Physician Assistant

## 2019-06-03 ENCOUNTER — Ambulatory Visit (INDEPENDENT_AMBULATORY_CARE_PROVIDER_SITE_OTHER): Payer: Medicare Other | Admitting: Physician Assistant

## 2019-06-03 DIAGNOSIS — C44622 Squamous cell carcinoma of skin of right upper limb, including shoulder: Secondary | ICD-10-CM

## 2019-06-03 DIAGNOSIS — C4492 Squamous cell carcinoma of skin, unspecified: Secondary | ICD-10-CM

## 2019-06-03 NOTE — Patient Instructions (Signed)

## 2019-06-03 NOTE — Progress Notes (Signed)
   Follow up Visit  Subjective  Joan Mclaughlin is a 79 y.o. female who presents for the following: Procedure (scc right forearm). Has healed but still has a small crusty bump.   Objective  Well appearing patient in no apparent distress; mood and affect are within normal limits.  A focused examination was performed including right forearm. Relevant physical exam findings are noted in the Assessment and Plan.   Objective  Right Forearm - Posterior: Biopsy scar identified.   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  Destruction of lesion Complexity: simple   Destruction method: electrodesiccation and curettage   Informed consent: discussed and consent obtained   Timeout:  patient name, date of birth, surgical site, and procedure verified Anesthesia: the lesion was anesthetized in a standard fashion   Anesthetic:  1% lidocaine w/ epinephrine 1-100,000 local infiltration Curettage performed in three different directions: Yes   Lesion length (cm):  1 Lesion width (cm):  1 Margin per side (cm):  0 Final wound size (cm):  1 Hemostasis achieved with:  ferric subsulfate and electrodesiccation Outcome: patient tolerated procedure well with no complications   Post-procedure details: wound care instructions given   Additional details:  Wound innoculated with 5 fluorouracil solution.

## 2019-06-04 DIAGNOSIS — M25672 Stiffness of left ankle, not elsewhere classified: Secondary | ICD-10-CM | POA: Diagnosis not present

## 2019-06-04 DIAGNOSIS — S82852E Displaced trimalleolar fracture of left lower leg, subsequent encounter for open fracture type I or II with routine healing: Secondary | ICD-10-CM | POA: Diagnosis not present

## 2019-06-09 DIAGNOSIS — S82852E Displaced trimalleolar fracture of left lower leg, subsequent encounter for open fracture type I or II with routine healing: Secondary | ICD-10-CM | POA: Diagnosis not present

## 2019-06-09 DIAGNOSIS — M25672 Stiffness of left ankle, not elsewhere classified: Secondary | ICD-10-CM | POA: Diagnosis not present

## 2019-06-11 DIAGNOSIS — S82852E Displaced trimalleolar fracture of left lower leg, subsequent encounter for open fracture type I or II with routine healing: Secondary | ICD-10-CM | POA: Diagnosis not present

## 2019-06-11 DIAGNOSIS — M25672 Stiffness of left ankle, not elsewhere classified: Secondary | ICD-10-CM | POA: Diagnosis not present

## 2019-06-12 DIAGNOSIS — M25672 Stiffness of left ankle, not elsewhere classified: Secondary | ICD-10-CM | POA: Diagnosis not present

## 2019-06-15 DIAGNOSIS — M25672 Stiffness of left ankle, not elsewhere classified: Secondary | ICD-10-CM | POA: Diagnosis not present

## 2019-06-15 DIAGNOSIS — S82852E Displaced trimalleolar fracture of left lower leg, subsequent encounter for open fracture type I or II with routine healing: Secondary | ICD-10-CM | POA: Diagnosis not present

## 2019-06-18 DIAGNOSIS — M25672 Stiffness of left ankle, not elsewhere classified: Secondary | ICD-10-CM | POA: Diagnosis not present

## 2019-06-18 DIAGNOSIS — S82852E Displaced trimalleolar fracture of left lower leg, subsequent encounter for open fracture type I or II with routine healing: Secondary | ICD-10-CM | POA: Diagnosis not present

## 2019-06-22 DIAGNOSIS — M25672 Stiffness of left ankle, not elsewhere classified: Secondary | ICD-10-CM | POA: Diagnosis not present

## 2019-06-22 DIAGNOSIS — S82852E Displaced trimalleolar fracture of left lower leg, subsequent encounter for open fracture type I or II with routine healing: Secondary | ICD-10-CM | POA: Diagnosis not present

## 2019-06-24 DIAGNOSIS — S82852E Displaced trimalleolar fracture of left lower leg, subsequent encounter for open fracture type I or II with routine healing: Secondary | ICD-10-CM | POA: Diagnosis not present

## 2019-06-24 DIAGNOSIS — M25672 Stiffness of left ankle, not elsewhere classified: Secondary | ICD-10-CM | POA: Diagnosis not present

## 2019-06-30 DIAGNOSIS — S82852E Displaced trimalleolar fracture of left lower leg, subsequent encounter for open fracture type I or II with routine healing: Secondary | ICD-10-CM | POA: Diagnosis not present

## 2019-06-30 DIAGNOSIS — M25672 Stiffness of left ankle, not elsewhere classified: Secondary | ICD-10-CM | POA: Diagnosis not present

## 2019-07-02 DIAGNOSIS — M25672 Stiffness of left ankle, not elsewhere classified: Secondary | ICD-10-CM | POA: Diagnosis not present

## 2019-07-02 DIAGNOSIS — S82852E Displaced trimalleolar fracture of left lower leg, subsequent encounter for open fracture type I or II with routine healing: Secondary | ICD-10-CM | POA: Diagnosis not present

## 2019-07-07 DIAGNOSIS — S82852E Displaced trimalleolar fracture of left lower leg, subsequent encounter for open fracture type I or II with routine healing: Secondary | ICD-10-CM | POA: Diagnosis not present

## 2019-07-07 DIAGNOSIS — M25672 Stiffness of left ankle, not elsewhere classified: Secondary | ICD-10-CM | POA: Diagnosis not present

## 2019-07-09 DIAGNOSIS — M25672 Stiffness of left ankle, not elsewhere classified: Secondary | ICD-10-CM | POA: Diagnosis not present

## 2019-07-09 DIAGNOSIS — S82852E Displaced trimalleolar fracture of left lower leg, subsequent encounter for open fracture type I or II with routine healing: Secondary | ICD-10-CM | POA: Diagnosis not present

## 2019-07-10 DIAGNOSIS — M25572 Pain in left ankle and joints of left foot: Secondary | ICD-10-CM | POA: Diagnosis not present

## 2019-07-23 DIAGNOSIS — T84197A Other mechanical complication of internal fixation device of bone of left lower leg, initial encounter: Secondary | ICD-10-CM | POA: Diagnosis not present

## 2019-07-23 DIAGNOSIS — M25572 Pain in left ankle and joints of left foot: Secondary | ICD-10-CM | POA: Diagnosis not present

## 2019-07-23 DIAGNOSIS — T8484XA Pain due to internal orthopedic prosthetic devices, implants and grafts, initial encounter: Secondary | ICD-10-CM | POA: Diagnosis not present

## 2019-07-23 DIAGNOSIS — Z472 Encounter for removal of internal fixation device: Secondary | ICD-10-CM | POA: Diagnosis not present

## 2019-07-23 DIAGNOSIS — T84197D Other mechanical complication of internal fixation device of bone of left lower leg, subsequent encounter: Secondary | ICD-10-CM | POA: Diagnosis not present

## 2019-07-24 DIAGNOSIS — M25572 Pain in left ankle and joints of left foot: Secondary | ICD-10-CM | POA: Diagnosis not present

## 2019-08-07 DIAGNOSIS — M25572 Pain in left ankle and joints of left foot: Secondary | ICD-10-CM | POA: Diagnosis not present

## 2019-08-19 DIAGNOSIS — Z961 Presence of intraocular lens: Secondary | ICD-10-CM | POA: Diagnosis not present

## 2019-08-19 DIAGNOSIS — H02403 Unspecified ptosis of bilateral eyelids: Secondary | ICD-10-CM | POA: Diagnosis not present

## 2019-08-21 DIAGNOSIS — M25572 Pain in left ankle and joints of left foot: Secondary | ICD-10-CM | POA: Diagnosis not present

## 2019-08-31 ENCOUNTER — Ambulatory Visit: Payer: Medicare Other | Admitting: Physician Assistant

## 2019-09-01 ENCOUNTER — Ambulatory Visit: Payer: Medicare Other | Admitting: Neurology

## 2019-09-03 ENCOUNTER — Ambulatory Visit: Payer: Medicare Other | Admitting: Physician Assistant

## 2019-09-22 DIAGNOSIS — M25672 Stiffness of left ankle, not elsewhere classified: Secondary | ICD-10-CM | POA: Diagnosis not present

## 2019-09-22 DIAGNOSIS — R2689 Other abnormalities of gait and mobility: Secondary | ICD-10-CM | POA: Diagnosis not present

## 2019-09-23 DIAGNOSIS — R2689 Other abnormalities of gait and mobility: Secondary | ICD-10-CM | POA: Diagnosis not present

## 2019-09-23 DIAGNOSIS — M25672 Stiffness of left ankle, not elsewhere classified: Secondary | ICD-10-CM | POA: Diagnosis not present

## 2019-09-29 DIAGNOSIS — R2689 Other abnormalities of gait and mobility: Secondary | ICD-10-CM | POA: Diagnosis not present

## 2019-09-29 DIAGNOSIS — M25672 Stiffness of left ankle, not elsewhere classified: Secondary | ICD-10-CM | POA: Diagnosis not present

## 2019-09-30 ENCOUNTER — Ambulatory Visit: Payer: Medicare Other | Admitting: Physician Assistant

## 2019-09-30 ENCOUNTER — Other Ambulatory Visit: Payer: Self-pay

## 2019-09-30 ENCOUNTER — Encounter: Payer: Self-pay | Admitting: Physician Assistant

## 2019-09-30 DIAGNOSIS — L219 Seborrheic dermatitis, unspecified: Secondary | ICD-10-CM | POA: Diagnosis not present

## 2019-09-30 DIAGNOSIS — L710 Perioral dermatitis: Secondary | ICD-10-CM

## 2019-09-30 DIAGNOSIS — D485 Neoplasm of uncertain behavior of skin: Secondary | ICD-10-CM | POA: Diagnosis not present

## 2019-09-30 DIAGNOSIS — D0472 Carcinoma in situ of skin of left lower limb, including hip: Secondary | ICD-10-CM | POA: Diagnosis not present

## 2019-09-30 DIAGNOSIS — Z85828 Personal history of other malignant neoplasm of skin: Secondary | ICD-10-CM

## 2019-09-30 DIAGNOSIS — Z8589 Personal history of malignant neoplasm of other organs and systems: Secondary | ICD-10-CM

## 2019-09-30 MED ORDER — KETOCONAZOLE 2 % EX SHAM: MEDICATED_SHAMPOO | Freq: Once | CUTANEOUS | 3 refills | Status: AC

## 2019-09-30 MED ORDER — HYDROCORTISONE 2.5 % EX CREA: TOPICAL_CREAM | Freq: Two times a day (BID) | CUTANEOUS | 11 refills | Status: AC | PRN

## 2019-09-30 MED ORDER — METRONIDAZOLE 0.75 % EX CREA: TOPICAL_CREAM | Freq: Two times a day (BID) | CUTANEOUS | 2 refills | Status: AC

## 2019-09-30 MED ORDER — CICLOPIROX OLAMINE 0.77 % EX CREA: TOPICAL_CREAM | Freq: Two times a day (BID) | CUTANEOUS | 1 refills | Status: DC

## 2019-09-30 NOTE — Progress Notes (Signed)
   Follow up Visit  Subjective  Joan Mclaughlin is a 79 y.o. female who presents for the following: Follow-up (scc right forearm. Doing ok. ) and breakout (on face. She is using hydrocortisone 2.5 and miconazole mixture. Nothing seems to help much. ). She itches near her brows. She is red and has bumps near her mouth.   Objective  Well appearing patient in no apparent distress; mood and affect are within normal limits.  A focused examination was performed including face, back, chest, arms, lower legs. Relevant physical exam findings are noted in the Assessment and Plan.   Objective  Right Forearm - Anterior: Dyspigmented scar.   Objective  Head - Anterior (Face): Erythematous plaques with greasy scale.   Objective  Head - Anterior (Face): Erythema and papules on chin near corners of the mouth  Objective  Left Lower Leg - Anterior: Brown macule with irregular color.     Assessment & Plan  History of squamous cell carcinoma Right Forearm - Anterior  Seborrheic dermatitis Head - Anterior (Face)  hydrocortisone 2.5 % cream - Head - Anterior (Face)  Reordered Medications ciclopirox (LOPROX) 0.77 % cream  Perioral dermatitis Head - Anterior (Face)  hydrocortisone 2.5 % cream - Head - Anterior (Face)  metroNIDAZOLE (METROCREAM) 0.75 % cream - Head - Anterior (Face)  Neoplasm of uncertain behavior of skin Left Lower Leg - Anterior  Skin / nail biopsy Type of biopsy: tangential   Informed consent: discussed and consent obtained   Timeout: patient name, date of birth, surgical site, and procedure verified   Procedure prep:  Patient was prepped and draped in usual sterile fashion (Non sterile) Prep type:  Chlorhexidine Anesthesia: the lesion was anesthetized in a standard fashion   Anesthetic:  1% lidocaine w/ epinephrine 1-100,000 local infiltration Instrument used: flexible razor blade   Outcome: patient tolerated procedure well   Post-procedure details:  wound care instructions given    Specimen 1 - Surgical pathology Differential Diagnosis: atypia Check Margins: No

## 2019-09-30 NOTE — Patient Instructions (Signed)

## 2019-10-02 DIAGNOSIS — M25672 Stiffness of left ankle, not elsewhere classified: Secondary | ICD-10-CM | POA: Diagnosis not present

## 2019-10-02 DIAGNOSIS — R2689 Other abnormalities of gait and mobility: Secondary | ICD-10-CM | POA: Diagnosis not present

## 2019-10-05 DIAGNOSIS — M25672 Stiffness of left ankle, not elsewhere classified: Secondary | ICD-10-CM | POA: Diagnosis not present

## 2019-10-05 DIAGNOSIS — R2689 Other abnormalities of gait and mobility: Secondary | ICD-10-CM | POA: Diagnosis not present

## 2019-10-09 DIAGNOSIS — M25672 Stiffness of left ankle, not elsewhere classified: Secondary | ICD-10-CM | POA: Diagnosis not present

## 2019-10-09 DIAGNOSIS — R2689 Other abnormalities of gait and mobility: Secondary | ICD-10-CM | POA: Diagnosis not present

## 2019-10-12 ENCOUNTER — Telehealth: Payer: Self-pay | Admitting: *Deleted

## 2019-10-12 ENCOUNTER — Encounter: Payer: Self-pay | Admitting: *Deleted

## 2019-10-12 NOTE — Telephone Encounter (Signed)
-----   Message from Arlyss Gandy, PA-C sent at 10/12/2019  8:36 AM EDT ----- 30 min

## 2019-10-12 NOTE — Telephone Encounter (Signed)
Pathology to patient, surgery scheduled  

## 2019-10-13 DIAGNOSIS — M25672 Stiffness of left ankle, not elsewhere classified: Secondary | ICD-10-CM | POA: Diagnosis not present

## 2019-10-13 DIAGNOSIS — R2689 Other abnormalities of gait and mobility: Secondary | ICD-10-CM | POA: Diagnosis not present

## 2019-10-26 ENCOUNTER — Telehealth: Payer: Self-pay | Admitting: Gastroenterology

## 2019-10-26 NOTE — Telephone Encounter (Signed)
Pt is requesting a call back to schedule her colonoscopy at the hospital.

## 2019-10-28 ENCOUNTER — Telehealth: Payer: Self-pay | Admitting: Gastroenterology

## 2019-10-28 ENCOUNTER — Other Ambulatory Visit: Payer: Self-pay

## 2019-10-28 DIAGNOSIS — D126 Benign neoplasm of colon, unspecified: Secondary | ICD-10-CM

## 2019-10-28 NOTE — Telephone Encounter (Signed)
Colon scheduled for 10/25 at [facility] with Dr Rush Landmark.  COVID on 10/21 at 955.  The pt has been advised and will pick up written instructions from the front desk today

## 2019-10-28 NOTE — Telephone Encounter (Signed)
1115 am arrival time given to the pt all other questions answered

## 2019-10-29 ENCOUNTER — Other Ambulatory Visit (HOSPITAL_COMMUNITY)
Admission: RE | Admit: 2019-10-29 | Discharge: 2019-10-29 | Disposition: A | Discharge status: Home / Self Care | Payer: Medicare Other | Source: Ambulatory Visit | Attending: Gastroenterology | Admitting: Gastroenterology

## 2019-10-29 DIAGNOSIS — Z01812 Encounter for preprocedural laboratory examination: Secondary | ICD-10-CM | POA: Diagnosis not present

## 2019-10-29 DIAGNOSIS — Z20822 Contact with and (suspected) exposure to covid-19: Secondary | ICD-10-CM | POA: Diagnosis not present

## 2019-10-29 LAB — SARS CORONAVIRUS 2 (TAT 6-24 HRS): SARS Coronavirus 2: NEGATIVE

## 2019-10-30 ENCOUNTER — Other Ambulatory Visit: Payer: Self-pay

## 2019-10-30 ENCOUNTER — Encounter (HOSPITAL_COMMUNITY): Payer: Self-pay | Admitting: Gastroenterology

## 2019-10-30 NOTE — Progress Notes (Signed)
Spoke with pt for pre-op call. Pt states she stopped her Amlodipine approximately 6 months ago on her own. She states she is using herbal medications to control, but states she does not check her blood pressure at home.  Covid test done 10/29/19 and pt states she has been in quarantine since the test was done and will stay in quarantine until she comes to the hospital on Monday.  EKG - 02/19/19  Instructed pt to hold Multivitamins, Herbal medications and Fish Oil as of now prior to surgery.

## 2019-11-02 ENCOUNTER — Ambulatory Visit (HOSPITAL_COMMUNITY): Payer: Medicare Other | Admitting: Certified Registered Nurse Anesthetist

## 2019-11-02 ENCOUNTER — Encounter (HOSPITAL_COMMUNITY)
Admission: RE | Disposition: A | Discharge status: Home / Self Care | Payer: Self-pay | Source: Home / Self Care | Attending: Gastroenterology

## 2019-11-02 ENCOUNTER — Encounter (HOSPITAL_COMMUNITY): Payer: Self-pay | Admitting: Gastroenterology

## 2019-11-02 ENCOUNTER — Other Ambulatory Visit: Payer: Self-pay

## 2019-11-02 ENCOUNTER — Ambulatory Visit (HOSPITAL_COMMUNITY)
Admission: RE | Admit: 2019-11-02 | Discharge: 2019-11-02 | Disposition: A | Discharge status: Home / Self Care | Payer: Medicare Other | Attending: Gastroenterology | Admitting: Gastroenterology

## 2019-11-02 DIAGNOSIS — K644 Residual hemorrhoidal skin tags: Secondary | ICD-10-CM | POA: Insufficient documentation

## 2019-11-02 DIAGNOSIS — D122 Benign neoplasm of ascending colon: Secondary | ICD-10-CM | POA: Insufficient documentation

## 2019-11-02 DIAGNOSIS — D490 Neoplasm of unspecified behavior of digestive system: Secondary | ICD-10-CM

## 2019-11-02 DIAGNOSIS — Z85828 Personal history of other malignant neoplasm of skin: Secondary | ICD-10-CM | POA: Diagnosis not present

## 2019-11-02 DIAGNOSIS — K642 Third degree hemorrhoids: Secondary | ICD-10-CM | POA: Insufficient documentation

## 2019-11-02 DIAGNOSIS — Z880 Allergy status to penicillin: Secondary | ICD-10-CM | POA: Diagnosis not present

## 2019-11-02 DIAGNOSIS — K635 Polyp of colon: Secondary | ICD-10-CM

## 2019-11-02 DIAGNOSIS — D120 Benign neoplasm of cecum: Secondary | ICD-10-CM | POA: Diagnosis not present

## 2019-11-02 DIAGNOSIS — Z8249 Family history of ischemic heart disease and other diseases of the circulatory system: Secondary | ICD-10-CM | POA: Diagnosis not present

## 2019-11-02 DIAGNOSIS — D123 Benign neoplasm of transverse colon: Secondary | ICD-10-CM | POA: Diagnosis not present

## 2019-11-02 DIAGNOSIS — G40909 Epilepsy, unspecified, not intractable, without status epilepticus: Secondary | ICD-10-CM | POA: Insufficient documentation

## 2019-11-02 DIAGNOSIS — Z86010 Personal history of colonic polyps: Secondary | ICD-10-CM | POA: Insufficient documentation

## 2019-11-02 DIAGNOSIS — I10 Essential (primary) hypertension: Secondary | ICD-10-CM | POA: Insufficient documentation

## 2019-11-02 DIAGNOSIS — Z1211 Encounter for screening for malignant neoplasm of colon: Secondary | ICD-10-CM | POA: Diagnosis not present

## 2019-11-02 DIAGNOSIS — D126 Benign neoplasm of colon, unspecified: Secondary | ICD-10-CM

## 2019-11-02 DIAGNOSIS — K648 Other hemorrhoids: Secondary | ICD-10-CM | POA: Diagnosis not present

## 2019-11-02 HISTORY — DX: Pneumonia, unspecified organism: J18.9

## 2019-11-02 HISTORY — PX: HEMOSTASIS CLIP PLACEMENT: SHX6857

## 2019-11-02 HISTORY — PX: POLYPECTOMY: SHX5525

## 2019-11-02 HISTORY — PX: COLONOSCOPY WITH PROPOFOL: SHX5780

## 2019-11-02 HISTORY — PX: ENDOSCOPIC MUCOSAL RESECTION: SHX6839

## 2019-11-02 SURGERY — COLONOSCOPY WITH PROPOFOL
Anesthesia: Monitor Anesthesia Care

## 2019-11-02 MED ORDER — LIDOCAINE 2% (20 MG/ML) 5 ML SYRINGE
INTRAMUSCULAR | Status: DC | PRN
  Administered 2019-11-02: 60 mg via INTRAVENOUS

## 2019-11-02 MED ORDER — SPOT INK MARKER SYRINGE KIT
PACK | SUBMUCOSAL | Status: AC
  Filled 2019-11-02: qty 10

## 2019-11-02 MED ORDER — LACTATED RINGERS IV SOLN
INTRAVENOUS | Status: DC
  Administered 2019-11-02: 1000 mL via INTRAVENOUS

## 2019-11-02 MED ORDER — PROPOFOL 500 MG/50ML IV EMUL
INTRAVENOUS | Status: DC | PRN
  Administered 2019-11-02: 120 ug/kg/min via INTRAVENOUS

## 2019-11-02 MED ORDER — PROPOFOL 10 MG/ML IV BOLUS
INTRAVENOUS | Status: DC | PRN
  Administered 2019-11-02: 20 mg via INTRAVENOUS

## 2019-11-02 MED ORDER — GLUCAGON HCL RDNA (DIAGNOSTIC) 1 MG IJ SOLR
INTRAMUSCULAR | Status: AC
  Filled 2019-11-02: qty 2

## 2019-11-02 MED ORDER — SODIUM CHLORIDE 0.9 % IV SOLN: INTRAVENOUS | Status: DC

## 2019-11-02 SURGICAL SUPPLY — 21 items

## 2019-11-02 NOTE — H&P (Signed)
GASTROENTEROLOGY PROCEDURE H&P NOTE   Primary Care Physician: Vivi Barrack, MD  HPI: Joan Mclaughlin is a 79 y.o. female who presents for Colonoscopy with follow up of prior large TC polyp EMR (s/p 2 prior resections) with last resection in 2020.  Past Medical History:  Diagnosis Date  . Alcoholism ([place])    sober for 47 years-noted 09/16/2018  . Allergy   . Anxiety   . Cancer ([place])    skin cancer  . Cholecystitis   . Complication of anesthesia   . Difficulty sleeping    takes med to sleep  . Diverticulitis   . Family history of adverse reaction to anesthesia    [place] - N/V  . Gallstones   . Ganglion cyst 12/11/2016  . GERD (gastroesophageal reflux disease)    "RELATED TO GALLBLADDER"  . Heart murmur    "ONLY DETECTED WHEN LYING DOWN"  - 10/16/2018- Not concerned with  . History of Lyme disease 09/19/2016  . Hyperlipidemia   . Hypertension   . Lyme disease    Pt states she has had Lyme Disease twice.   . Nocturia   . Pneumonia   . PONV (postoperative nausea and vomiting)    Nausea  . S/P laparoscopic cholecystectomy 12/23/2014  . Seizure disorder ([place])   . Seizures ([place])   . Seizures ([place])    "last one 10- 15 years ago - 10/16/2018  . 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 Trimalleolar fracture    left  . Tubular adenoma of colon 06/27/2018   Past Surgical History:  Procedure Laterality Date  . CHOLECYSTECTOMY N/A 12/23/2014   Procedure: LAPAROSCOPIC CHOLECYSTECTOMY WITH INTRAOPERATIVE CHOLANGIOGRAM;  Surgeon: Greer Pickerel, MD;  Location: [facility];  Service: General;  Laterality: N/A;  . COLONOSCOPY     "3 times trying to get large polyp since August 2020"  . COLONOSCOPY WITH PROPOFOL N/A 10/20/2018   Procedure: COLONOSCOPY WITH PROPOFOL;  Surgeon: Rush Landmark Telford Nab., MD;  Location: [facility];  Service: Gastroenterology;  Laterality: N/A;  .  ENDOSCOPIC MUCOSAL RESECTION N/A 10/20/2018   Procedure: ENDOSCOPIC MUCOSAL RESECTION;  Surgeon: Rush Landmark Telford Nab., MD;  Location: [place];  Service: Gastroenterology;  Laterality: N/A;  . HEMOSTASIS CLIP PLACEMENT  10/20/2018   Procedure: HEMOSTASIS CLIP PLACEMENT;  Surgeon: Irving Copas., MD;  Location: [facility];  Service: Gastroenterology;;  . ORIF ANKLE FRACTURE Left 02/19/2019   Procedure: OPEN REDUCTION INTERNAL FIXATION (ORIF) LEFT ANKLE FRACTURE;  Surgeon: Erle Crocker, MD;  Location: [facility];  Service: Orthopedics;  Laterality: Left;  . POLYPECTOMY  10/20/2018   Procedure: POLYPECTOMY;  Surgeon: Mansouraty, Telford Nab., MD;  Location: [place];  Service: Gastroenterology;;  . Lia Foyer LIFTING INJECTION  10/20/2018   Procedure: SUBMUCOSAL LIFTING INJECTION;  Surgeon: Irving Copas., MD;  Location: [place];  Service: Gastroenterology;;  . SYNDESMOSIS REPAIR Left 02/19/2019   Procedure: SYNDESMOSIS REPAIR;  Surgeon: Erle Crocker, MD;  Location: [facility];  Service: Orthopedics;  Laterality: Left;  . THORACIC DISCECTOMY  07/2007   T12  . TUBAL LIGATION    . WISDOM TOOTH EXTRACTION     No current facility-administered medications for this encounter.   Allergies  Allergen Reactions  . Amoxicillin-Pot Clavulanate Nausea And Vomiting    Did it involve swelling of the face/tongue/throat, SOB, or low BP? No Did it involve sudden or severe rash/hives, skin  peeling, or any reaction on the inside of your mouth or nose? No Did you need to seek medical attention at a hospital or doctor's office? No When did it last happen?in 2016 If all above answers are "NO", may proceed with cephalosporin use.   . Suvorexant Other (See Comments)    Nightmares    Family History  Problem Relation Age of Onset  . Arthritis Father   . Alcohol abuse Mother        drinker and smoker  . Diabetes Mother   . Heart disease Mother   . Hypertension  Mother   . Stroke Mother   . Depression Sister   . Hearing loss Sister   . Hyperlipidemia Sister   . Alcohol abuse Daughter   . Alcohol abuse Son   . Depression Sister   . Early death Sister   . Alcohol abuse Brother   . Depression Sister   . Heart disease Sister   . Colon cancer Neg Hx   . Esophageal cancer Neg Hx   . Inflammatory bowel disease Neg Hx   . Liver disease Neg Hx   . Pancreatic cancer Neg Hx   . Rectal cancer Neg Hx   . Stomach cancer Neg Hx    Social History   Socioeconomic History  . Marital status: Single    Spouse name: Not on file  . Number of children: 4  . Years of education: 83  . Highest education level: Not on file  Occupational History  . Occupation: retired    Comment: Accounts payable and nanny   Tobacco Use  . Smoking status: Never Smoker  . Smokeless tobacco: Never Used  Vaping Use  . Vaping Use: Never used  Substance and Sexual Activity  . Alcohol use: No    Alcohol/week: 0.0 standard drinks    Comment: sober for 47 years-noted 09/16/2018  . Drug use: No  . Sexual activity: Not Currently  Other Topics Concern  . Not on file  Social History Narrative   Patient lives alone.   patient has 4 grandchildren.   Patient is retired   Patient has 2 years of college.   Patient is right handed.         Social Determinants of Health   Financial Resource Strain:   . Difficulty of Paying Living Expenses: Not on file  Food Insecurity:   . Worried About Charity fundraiser in the Last Year: Not on file  . Ran Out of Food in the Last Year: Not on file  Transportation Needs:   . Lack of Transportation (Medical): Not on file  . Lack of Transportation (Non-Medical): Not on file  Physical Activity:   . Days of Exercise per Week: Not on file  . Minutes of Exercise per Session: Not on file  Stress:   . Feeling of Stress : Not on file  Social Connections:   . Frequency of Communication with Friends and Family: Not on file  . Frequency of Social  Gatherings with Friends and Family: Not on file  . Attends Religious Services: Not on file  . Active Member of Clubs or Organizations: Not on file  . Attends Archivist Meetings: Not on file  . Marital Status: Not on file  Intimate Partner Violence:   . Fear of Current or Ex-Partner: Not on file  . Emotionally Abused: Not on file  . Physically Abused: Not on file  . Sexually Abused: Not on file    Physical Exam:  Vital signs in last 24 hours:     GEN: NAD EYE: Sclerae anicteric ENT: MMM CV: Non-tachycardic GI: Soft, NT/ND NEURO:  Alert & Oriented x 3  Lab Results: No results for input(s): WBC, HGB, HCT, PLT in the last 72 hours. BMET No results for input(s): NA, K, CL, CO2, GLUCOSE, BUN, CREATININE, CALCIUM in the last 72 hours. LFT No results for input(s): PROT, ALBUMIN, AST, ALT, ALKPHOS, BILITOT, BILIDIR, IBILI in the last 72 hours. PT/INR No results for input(s): LABPROT, INR in the last 72 hours.   Impression / Plan: This is a 79 y.o.female who presents for Colonoscopy with follow up of prior large TC polyp EMR (s/p 2 prior resections) with last resection in 2020.  The risks and benefits of endoscopic evaluation were discussed with the patient; these include but are not limited to the risk of perforation, infection, bleeding, missed lesions, lack of diagnosis, severe illness requiring hospitalization, as well as anesthesia and sedation related illnesses.  The patient is agreeable to proceed.    Justice Britain, MD [facility]oscopy Office # [phone]

## 2019-11-02 NOTE — Anesthesia Procedure Notes (Signed)
Procedure Name: MAC Date/Time: 11/02/2019 8:57 AM Performed by: Inda Coke, CRNA Pre-anesthesia Checklist: Patient identified, Emergency Drugs available, Suction available, Timeout performed and Patient being monitored Patient Re-evaluated:Patient Re-evaluated prior to induction Oxygen Delivery Method: Simple face mask Induction Type: IV induction Dental Injury: Teeth and Oropharynx as per pre-operative assessment

## 2019-11-02 NOTE — Op Note (Addendum)
[facility] Patient Name: Joan Mclaughlin Procedure Date : 11/02/2019 MRN: [id number] Attending MD: Justice Britain , MD Date of Birth: 05/11/1940 CSN: [id number] Age: 79 Admit Type: Outpatient Procedure:                Colonoscopy Indications:              High risk colon cancer surveillance: Personal                            history of colonic polyps, Surveillance: Personal                            history of piecemeal removal of adenoma on last                            colonoscopy (less than 1 year ago) Providers:                Justice Britain, MD, Burtis Junes, RN, Cletis Athens,                            Technician, Rejeana Brock, CRNA Referring MD:             Ronnette Juniper, MD, Joaquim Nam Medicines:                Monitored Anesthesia Care Complications:            No immediate complications. Estimated Blood Loss:     Estimated blood loss was minimal. Procedure:                Pre-Anesthesia Assessment:                           - Prior to the procedure, a History and Physical                            was performed, and patient medications and                            allergies were reviewed. The patient's tolerance of                            previous anesthesia was also reviewed. The risks                            and benefits of the procedure and the sedation                            options and risks were discussed with the patient.                            All questions were answered, and informed consent                            was obtained. Prior Anticoagulants: The patient has  taken no previous anticoagulant or antiplatelet                            agents. ASA Grade Assessment: III - A patient with                            severe systemic disease. After reviewing the risks                            and benefits, the patient was deemed in                            satisfactory condition to undergo the  procedure.                           After obtaining informed consent, the colonoscope                            was passed under direct vision. Throughout the                            procedure, the patient's blood pressure, pulse, and                            oxygen saturations were monitored continuously. The                            PCF-H190DL ([id number] Olympus pediatric colonoscope                            was introduced through the anus and advanced to the                            5 cm into the ileum. The colonoscopy was performed                            without difficulty. The patient tolerated the                            procedure. The quality of the bowel preparation was                            good. The terminal ileum, ileocecal valve,                            appendiceal orifice, and rectum were photographed. Scope In: 9:11:39 AM Scope Out: 10:17:04 AM Total Procedure Duration: 1 hour 5 minutes 25 seconds  Findings:      The digital rectal exam findings include hemorrhoids. Pertinent       negatives include no palpable rectal lesions.      The terminal ileum and ileocecal valve appeared normal.      Four sessile polyps were found in the cecum (1), transverse colon (2)       and ascending colon (1). The  polyps were 3 to 4 mm in size. These polyps       were removed with a cold snare. Resection and retrieval were complete.      A tattoo was seen in the transverse colon. A post-polypectomy scar was       found at the tattoo site with polypoid lesion noted below.      A 25 mm polypoid lesion was found in the transverse colon with tattoo       present under this. The lesion was semi-sessile. No bleeding was       present. Preparations were made for an underwater mucosal resection. NBI       Imaging and White-light endoscopy was done to demarcate the borders of       the lesion. CO2 removal was performed and the entire transverse colon       had water  submersion was injected to raise the lesion. Piecemeal       underwater mucosal resection using a snare was performed. Resection and       retrieval were complete. To prevent bleeding after mucosal resection,       six hemostatic clips were successfully placed (MR conditional). There       was no bleeding at the end of the procedure.      Normal mucosa was found in the entire colon otherwise.      Non-bleeding non-thrombosed external and internal hemorrhoids were found       during retroflexion, during perianal exam and during digital exam. The       hemorrhoids were Grade III (internal hemorrhoids that prolapse but       require manual reduction). Impression:               - Hemorrhoids found on digital rectal exam.                           - The examined portion of the ileum was normal.                           - Four 3 to 4 mm polyps in the transverse colon and                            cecum and in the ascending colon, removed with cold                            snare. Resected and retrieved.                           - A tattoo was seen in the transverse colon. A                            post-polypectomy scar was found at the tattoo site                            with adjacent polypoid lesion in the transverse                            colon. Complete removal was accomplished with  piecemeal underwater resection. Clips (MR                            conditional) were placed to decrease risk of                            post-interventional bleeding.                           - Normal mucosa in the entire examined colon                            otherwise.                           - Non-bleeding non-thrombosed external and internal                            hemorrhoids. Recommendation:           - The patient will be observed post-procedure,                            until all discharge criteria are met.                           - Discharge  patient to home.                           - Patient has a contact number available for                            emergencies. The signs and symptoms of potential                            delayed complications were discussed with the                            patient. Return to normal activities tomorrow.                            Written discharge instructions were provided to the                            patient.                           - High fiber diet.                           - Continue present medications.                           - No aspirin, ibuprofen, naproxen, or other                            non-steroidal anti-inflammatory drugs for 2 weeks.                           -  Await pathology results.                           - Repeat colonoscopy in 6-12 months for                            surveillance.                           - The findings and recommendations were discussed                            with the patient.                           - The findings and recommendations were discussed                            with the patient's family. Procedure Code(s):        --- Professional ---                           [phone], Colonoscopy, flexible; with endoscopic                            mucosal resection                           45385, 39, Colonoscopy, flexible; with removal of                            tumor(s), polyp(s), or other lesion(s) by snare                            technique Diagnosis Code(s):        --- Professional ---                           Z86.010, Personal history of colonic polyps                           K64.2, Third degree hemorrhoids                           K63.5, Polyp of colon                           D49.0, Neoplasm of unspecified behavior of                            digestive system                           Z09, Encounter for follow-up examination after                            completed treatment for conditions other  than  malignant neoplasm CPT copyright 2019 American Medical Association. All rights reserved. The codes documented in this report are preliminary and upon coder review may  be revised to meet current compliance requirements. Justice Britain, MD 11/02/2019 10:45:39 AM Number of Addenda: 0

## 2019-11-02 NOTE — Anesthesia Postprocedure Evaluation (Signed)
Anesthesia Post Note  Patient: Joan Mclaughlin  Procedure(s) Performed: COLONOSCOPY WITH PROPOFOL (N/A ) POLYPECTOMY HEMOSTASIS CLIP PLACEMENT ENDOSCOPIC MUCOSAL RESECTION     Patient location during evaluation: PACU Anesthesia Type: MAC Level of consciousness: awake and alert Pain management: pain level controlled Vital Signs Assessment: post-procedure vital signs reviewed and stable Respiratory status: spontaneous breathing, nonlabored ventilation, respiratory function stable and patient connected to nasal cannula oxygen Cardiovascular status: stable and blood pressure returned to baseline Postop Assessment: no apparent nausea or vomiting Anesthetic complications: no   No complications documented.  Last Vitals:  Vitals:   11/02/19 1027 11/02/19 1043  BP: (!) 153/84 (!) 177/78  Pulse: 63 (!) 59  Resp: 18 18  Temp: (!) 36.4 C   SpO2: 100% 100%    Last Pain:  Vitals:   11/02/19 1043  TempSrc:   PainSc: 0-No pain                 Effie Berkshire

## 2019-11-02 NOTE — Anesthesia Preprocedure Evaluation (Addendum)
Anesthesia Evaluation  Patient identified by MRN, date of birth, ID band Patient awake    Reviewed: Allergy & Precautions, NPO status , Patient's Chart, lab work & pertinent test results  History of Anesthesia Complications (+) PONV and history of anesthetic complications  Airway Mallampati: II  TM Distance: >3 FB Neck ROM: Full    Dental  (+) Dental Advisory Given   Pulmonary neg shortness of breath, neg sleep apnea, neg COPD, neg recent URI,  Covid-19 Nucleic Acid Test Results Lab Results      Component                Value               Date                      [place]              NEGATIVE            10/29/2019                [place]              NEGATIVE            02/16/2019                [place]              NOT DETECTED        10/16/2018                [place]              Not Detected        08/15/2018              breath sounds clear to auscultation       Cardiovascular hypertension,  Rhythm:Regular     Neuro/Psych Seizures -, Well Controlled,  PSYCHIATRIC DISORDERS Anxiety    GI/Hepatic Neg liver ROS, GERD  ,  Endo/Other  negative endocrine ROS  Renal/GU negative Renal ROSLab Results      Component                Value               Date                      CREATININE               0.52                02/19/2019                Musculoskeletal negative musculoskeletal ROS (+)   Abdominal   Peds  Hematology negative hematology ROS (+) Lab Results      Component                Value               Date                      WBC                      7.4                 02/19/2019                HGB  13.3                02/19/2019                HCT                      40.0                02/19/2019                MCV                      93.0                02/19/2019                PLT                      299                 02/19/2019              Anesthesia  Other Findings   Reproductive/Obstetrics                            Anesthesia Physical Anesthesia Plan  ASA: II  Anesthesia Plan: MAC   Post-op Pain Management:    Induction: Intravenous  PONV Risk Score and Plan: 3 and Propofol infusion and Treatment may vary due to age or medical condition  Airway Management Planned: Nasal Cannula  Additional Equipment: None  Intra-op Plan:   Post-operative Plan:   Informed Consent: I have reviewed the patients History and Physical, chart, labs and discussed the procedure including the risks, benefits and alternatives for the proposed anesthesia with the patient or authorized representative who has indicated his/her understanding and acceptance.     Dental advisory given  Plan Discussed with: CRNA and Surgeon  Anesthesia Plan Comments:         Anesthesia Quick Evaluation

## 2019-11-02 NOTE — Discharge Instructions (Signed)

## 2019-11-02 NOTE — Transfer of Care (Signed)
Immediate Anesthesia Transfer of Care Note  Patient: Joan Mclaughlin [facility]  Procedure(s) Performed: COLONOSCOPY WITH PROPOFOL (N/A ) POLYPECTOMY HEMOSTASIS CLIP PLACEMENT ENDOSCOPIC MUCOSAL RESECTION  Patient Location: PACU  Anesthesia Type:MAC  Level of Consciousness: awake and alert   Airway & Oxygen Therapy: Patient Spontanous Breathing and Patient connected to nasal cannula oxygen  Post-op Assessment: Report given to RN and Post -op Vital signs reviewed and stable  Post vital signs: Reviewed and stable  Last Vitals:  Vitals Value Taken Time  BP 153/84 11/02/19 1027  Temp 36.4 C 11/02/19 1027  Pulse 61 11/02/19 1028  Resp 15 11/02/19 1028  SpO2 100 % 11/02/19 1028  Vitals shown include unvalidated device data.  Last Pain:  Vitals:   11/02/19 0818  TempSrc: Oral  PainSc: 0-No pain         Complications: No complications documented.

## 2019-11-03 ENCOUNTER — Encounter (HOSPITAL_COMMUNITY): Payer: Self-pay | Admitting: Gastroenterology

## 2019-11-03 LAB — SURGICAL PATHOLOGY

## 2019-11-06 ENCOUNTER — Other Ambulatory Visit: Payer: Self-pay | Admitting: Family Medicine

## 2019-11-10 ENCOUNTER — Other Ambulatory Visit: Payer: Self-pay | Admitting: Family Medicine

## 2019-11-10 ENCOUNTER — Other Ambulatory Visit: Payer: Self-pay | Admitting: Neurology

## 2019-11-10 DIAGNOSIS — G40009 Localization-related (focal) (partial) idiopathic epilepsy and epileptic syndromes with seizures of localized onset, not intractable, without status epilepticus: Secondary | ICD-10-CM

## 2019-11-10 NOTE — Telephone Encounter (Signed)
Please see below.   MEDICATION: Amlodipine 5 mg  PHARMACY: [facility]  Comments:   **Let patient know to contact pharmacy at the end of the day to make sure medication is ready. **  ** Please notify patient to allow 48-72 hours to process**  **Encourage patient to contact the pharmacy for refills or they can request refills through [facility]**

## 2019-11-10 NOTE — Telephone Encounter (Signed)
Amlodipine not on pt current med list, should pt still be on this?

## 2019-11-11 ENCOUNTER — Encounter: Payer: Self-pay | Admitting: Gastroenterology

## 2019-11-11 ENCOUNTER — Telehealth: Payer: Self-pay

## 2019-11-11 ENCOUNTER — Other Ambulatory Visit: Payer: Self-pay | Admitting: Neurology

## 2019-11-11 DIAGNOSIS — G40009 Localization-related (focal) (partial) idiopathic epilepsy and epileptic syndromes with seizures of localized onset, not intractable, without status epilepticus: Secondary | ICD-10-CM

## 2019-11-11 MED ORDER — OXCARBAZEPINE 300 MG PO TABS: ORAL_TABLET | ORAL | 3 refills | Status: DC

## 2019-11-11 NOTE — Telephone Encounter (Signed)
Patient called and said she is almost out of her seizure medication, Oxcarbazepine 300 MG. She is scheduled 04/11/2020 and has been added to the wait list for a sooner appointment. Patient is requesting refills to last until her next appointment. She said it was previously denied.  [facility] on [address] and [address]

## 2019-11-11 NOTE — Telephone Encounter (Signed)
..   LAST APPOINTMENT DATE: 04/13/19  NEXT APPOINTMENT DATE:@Visit  date not found  MEDICATION:amLODipine (NORVASC) 5 MG tablet

## 2019-11-11 NOTE — Telephone Encounter (Signed)
Med not on current med list, ok to fill?

## 2019-11-12 MED ORDER — AMLODIPINE BESYLATE 5 MG PO TABS: 5.0000 mg | ORAL_TABLET | Freq: Every day | ORAL | 3 refills | Status: DC

## 2019-11-12 NOTE — Addendum Note (Signed)
Addended by: Clyde Lundborg A on: 11/12/2019 04:09 PM   Modules accepted: Orders

## 2019-11-12 NOTE — Telephone Encounter (Signed)
Ok with me. Please place any necessary orders. 

## 2019-11-12 NOTE — Telephone Encounter (Signed)
Medication sent to pharmacy  

## 2019-11-18 ENCOUNTER — Other Ambulatory Visit: Payer: Self-pay | Admitting: Family Medicine

## 2019-11-18 ENCOUNTER — Telehealth: Payer: Self-pay | Admitting: Gastroenterology

## 2019-11-18 NOTE — Telephone Encounter (Signed)
Pt is requesting a call back from a nurse to discuss the upper left quadrant pain she is experiencing since her last colonoscopy.

## 2019-11-18 NOTE — Telephone Encounter (Signed)
LAST APPOINTMENT DATE: 04/13/2019   NEXT APPOINTMENT DATE: Visit date not found  Rx Xanax  LAST REFILL: 04/13/2019  QTY: 30 REF 5

## 2019-11-18 NOTE — Telephone Encounter (Signed)
Left message on machine to call back  

## 2019-11-19 NOTE — Telephone Encounter (Signed)
The pt states that she had colon on 10/25 and had some "trapped gas" on the left side yesterday.  She says she had a good BM and passed gas last night and feels good today.  She will call back if her symptoms return

## 2019-11-23 ENCOUNTER — Other Ambulatory Visit: Payer: Self-pay | Admitting: Dermatology

## 2019-11-23 NOTE — Telephone Encounter (Signed)
Patient is calling for a refill on Hydroxyzine.  Patient tried pharmacy several times but gets a busy signal.  Patient uses [facility] on [address] [phone].

## 2019-11-24 MED ORDER — HYDROXYZINE HCL 10 MG PO TABS
10.0000 mg | ORAL_TABLET | Freq: Every day | ORAL | 5 refills | Status: DC
Start: 2019-11-24 — End: 2020-08-16

## 2019-12-10 ENCOUNTER — Other Ambulatory Visit: Payer: Medicare Other

## 2019-12-10 ENCOUNTER — Encounter: Payer: Medicare Other | Admitting: Dermatology

## 2019-12-14 ENCOUNTER — Other Ambulatory Visit: Payer: Medicare Other

## 2019-12-14 DIAGNOSIS — Z20822 Contact with and (suspected) exposure to covid-19: Secondary | ICD-10-CM

## 2019-12-15 ENCOUNTER — Encounter: Payer: Self-pay | Admitting: Family Medicine

## 2019-12-15 ENCOUNTER — Telehealth (INDEPENDENT_AMBULATORY_CARE_PROVIDER_SITE_OTHER): Payer: Medicare Other | Admitting: Family Medicine

## 2019-12-15 ENCOUNTER — Telehealth: Payer: Self-pay

## 2019-12-15 VITALS — Temp 97.8°F | Ht 63.0 in | Wt 146.0 lb

## 2019-12-15 DIAGNOSIS — Z7185 Encounter for immunization safety counseling: Secondary | ICD-10-CM | POA: Diagnosis not present

## 2019-12-15 DIAGNOSIS — I10 Essential (primary) hypertension: Secondary | ICD-10-CM | POA: Diagnosis not present

## 2019-12-15 NOTE — Assessment & Plan Note (Signed)
Well managed on amlodipine 5mg  daily.

## 2019-12-15 NOTE — Telephone Encounter (Signed)
error 

## 2019-12-15 NOTE — Progress Notes (Signed)
   Joan Mclaughlin is a 79 y.o. female who presents today for a telephone visit.  Assessment/Plan:  Chronic Problems Addressed Today: No problem-specific Assessment & Plan notes found for this encounter.  Preventative Healthcare Extensively discussed Covid vaccine with patient today.  Answered questions.  Recommended patient get vaccinated to soon as possible.  Spent approximately 11 minutes of discussion today.    Subjective:  HPI:  Discussed Covid vaccine with patient today.  She is concerned about adverse reactions and effectiveness.       Objective/Observations   NAD  Telephone Visit   I connected with Joan Mclaughlin on 12/15/19 at  4:00 PM EST via telephone and verified that I am speaking with the correct person using two identifiers. I discussed the limitations of evaluation and management by telemedicine and the availability of in person appointments. The patient expressed understanding and agreed to proceed.   Patient location: Home Provider location: [place] participating in the virtual visit: Myself and Patient  A total of 11 minutes were spent on medical discussion.      [name]. [name], MD 12/15/2019 9:07 AM

## 2019-12-16 LAB — SARS-COV-2, NAA 2 DAY TAT

## 2019-12-16 LAB — NOVEL CORONAVIRUS, NAA: SARS-CoV-2, NAA: NOT DETECTED

## 2020-01-11 ENCOUNTER — Telehealth: Payer: Self-pay

## 2020-01-11 NOTE — Telephone Encounter (Signed)
Please advise 

## 2020-01-11 NOTE — Telephone Encounter (Signed)
Patient called in asking to speak with Dr.Parker but did not want to schedule a visit. Joan Mclaughlin states that her son came down about a week ago from [place], but just started having symptoms when tested he tested positive. Patient is due for her booster this Wednesday and wanted to know if she should quarantine.

## 2020-01-11 NOTE — Telephone Encounter (Signed)
If she is asymptomatic then it is fine for her to get a booster. If she starts to show symptoms then would like for her to let us know but in the case will need to wait at least 2 weeks.  Joan Mclaughlin. [name], MD 01/11/2020 3:54 PM

## 2020-01-11 NOTE — Telephone Encounter (Signed)
Patient notified ok to get second Covid vaccine. No symptoms at this time.

## 2020-01-13 ENCOUNTER — Other Ambulatory Visit: Payer: Self-pay | Admitting: *Deleted

## 2020-01-13 ENCOUNTER — Telehealth: Payer: Self-pay

## 2020-01-13 MED ORDER — VALACYCLOVIR HCL 500 MG PO TABS
500.0000 mg | ORAL_TABLET | Freq: Every day | ORAL | 0 refills | Status: DC | PRN
Start: 2020-01-13 — End: 2020-02-23

## 2020-01-13 NOTE — Telephone Encounter (Signed)
Rx send to pharmacy  

## 2020-01-13 NOTE — Telephone Encounter (Signed)
Error

## 2020-01-13 NOTE — Telephone Encounter (Signed)
.   LAST APPOINTMENT DATE: 01/13/2020   NEXT APPOINTMENT DATE:@Visit  date not found  MEDICATION:valACYclovir (VALTREX) 500 MG tablet  PHARMACY:[id number] - [place], [place] - [address]  CLINICAL FILLS OUT ALL BELOW:   LAST REFILL:  QTY:  REFILL DATE:    OTHER COMMENTS:    Okay for refill?  Please advise

## 2020-01-21 ENCOUNTER — Other Ambulatory Visit: Payer: Self-pay

## 2020-01-21 NOTE — Telephone Encounter (Signed)
.   LAST APPOINTMENT DATE: 01/13/2020   NEXT APPOINTMENT DATE:@Visit  date not found  MEDICATION:ALPRAZolam (XANAX) 1 MG tablet  PHARMACY:[id number] - [place], [place] - [address] AT [address] FILLS OUT ALL BELOW:   LAST REFILL:  QTY:  REFILL DATE:    OTHER COMMENTS:    Okay for refill?  Please advise

## 2020-01-22 MED ORDER — ALPRAZOLAM 1 MG PO TABS: 0.5000 mg | ORAL_TABLET | Freq: Every day | ORAL | 0 refills | Status: DC

## 2020-01-28 ENCOUNTER — Encounter: Payer: Medicare Other | Admitting: Dermatology

## 2020-02-22 ENCOUNTER — Other Ambulatory Visit: Payer: Self-pay | Admitting: Family Medicine

## 2020-02-22 ENCOUNTER — Telehealth: Payer: Self-pay

## 2020-02-22 NOTE — Telephone Encounter (Signed)
MEDICATION: Valtrex 500 MG  PHARMACY: [facility] [address]  Comments:   **Let patient know to contact pharmacy at the end of the day to make sure medication is ready. **  ** Please notify patient to allow 48-72 hours to process**  **Encourage patient to contact the pharmacy for refills or they can request refills through [facility]**

## 2020-02-23 NOTE — Telephone Encounter (Signed)
Rx was send to pharmacy  

## 2020-02-26 ENCOUNTER — Other Ambulatory Visit: Payer: Self-pay

## 2020-02-26 ENCOUNTER — Encounter: Payer: Self-pay | Admitting: Neurology

## 2020-02-26 ENCOUNTER — Ambulatory Visit: Payer: Medicare Other | Admitting: Neurology

## 2020-02-26 DIAGNOSIS — G40009 Localization-related (focal) (partial) idiopathic epilepsy and epileptic syndromes with seizures of localized onset, not intractable, without status epilepticus: Secondary | ICD-10-CM | POA: Diagnosis not present

## 2020-02-26 MED ORDER — OXCARBAZEPINE 300 MG PO TABS: ORAL_TABLET | ORAL | 3 refills | Status: DC

## 2020-02-26 NOTE — Progress Notes (Signed)
NEUROLOGY FOLLOW UP OFFICE NOTE  Joan Mclaughlin [id number] 1940-03-21  HISTORY OF PRESENT ILLNESS: I had the pleasure of seeing Joan Mclaughlin in follow-up in the neurology clinic on 02/26/2020.  The patient was last seen over a year ago for seizures. She continues to do well and denies any seizures or seizure-like symptoms since July 2013. She is on oxcarbazepine 300mg  BID without side effects. She denies any staring/unresponsive episodes, gaps in time, olfactory/gustatory hallucinations, focal numbness/tingling/weakness, myoclonic jerks. No headaches, dizziness, vision changes, no falls. She injured her left ankle a year ago and had 2 surgeries, she has recovered well. Sleep was off since she visited her daughter in [place], but it is a little better since things have calmed down. Mood is good.    History on Initial Assessment 05/01/2013: This is a 80 yo RH woman with a history of seizures diagnosed in 2008. She was in [place] and was sleep deprived, started feeling strange, "like looking from the outside in," then fell down with a generalized convulsion. She was living in [place] and had seen a neurologist, started on Trileptal, which she had been taking for several years until she self-reduced dose by half. She had been taking this lower dose for 2 years with no seizures until July 2013. At that time, she had been taking lorazepam 1mg  qhs for several years and did not take it the night prior, instead took a different over the counter sleep aid. She woke up with blood on her face and bowel incontinence, then apparently had another seizure that was witnessed by her son with foaming at the mouth. She was started on Keppra but felt weird on this and was put back on Trileptal 300mg  BID since then, with no further seizures.   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 She "always fainted" in church. In college, she was  drinking the night before and recalls looking in the mirror, then found herself on the floor. She denies any gaps in time, no staring/unresponsive episodes, olfactory/gustatory hallucinations, focal numbness/tingling/weakness, myoclonic jerks. She has never been a good sleeper, and has tried all the over the counter medications with no effect. She has tried Ambien (felt hungover), Lunesta and Melatonin gave her weird nightmarish thoughts. She has been taking lorazepam nightly for at least 3 or 4 years, and this is the only medication that helps her without feeling hungover the next day.   Epilepsy Risk Factors: Her brother has seizures. Another brother may have seizures also, he takes phenobarbital. Her nephew may have seizures as well. Otherwise she had a normal birth and early development. There is no history of febrile convulsions, CNS infections such as meningitis/encephalitis, significant traumatic brain injury, neurosurgical procedures.  Diagnostic Data: Routine EEG by Dr. Sonny Masters 07/2005: Left posterior frontal sharp wave activity with phase reversal at F7. Left frontal sharp and slow activity. Abnormal EEG with posterior frontal low activity compatible with seizure disorder with left frontal focus.  Routine EEG by Dr. Sonny Masters 06/2007: Spike activity with phase reversal noted at [facility]. Hyperventilation denoted spike activity. Impression: Left frontal region irritative lesion compatible with underlying seizures.  Routine EEG by Dr. Sonny Masters 05/2009: Left temporal spike activity noted with phase reversal T3. Impression: Abnormal due to irritative lesion left temporal region compatible with underlying seizure disorder.  MRI brain 04/2006: There are scattered punctate foci of signal abnormality in the cerebral white matter bilaterally. These findings are consistent with mild  microvascular ischemia.  Per Dr. Prentice Docker note: "Reviewed MRI head, MRI brain from March 28, 2006 demonstrates atrophy in the medial  temporal lobes, more prominent on the right." She had a seizure in July 2013 felt to be provoked by diphenhydramine and low dose Trileptal  PAST MEDICAL HISTORY: Past Medical History:  Diagnosis Date  . Alcoholism ([place])    sober for 47 years-noted 09/16/2018  . Allergy   . Anxiety   . Cancer ([place])    skin cancer  . Cholecystitis   . Complication of anesthesia   . Difficulty sleeping    takes med to sleep  . Diverticulitis   . Family history of adverse reaction to anesthesia    SIster - N/V  . Gallstones   . Ganglion cyst 12/11/2016  . GERD (gastroesophageal reflux disease)    "RELATED TO GALLBLADDER"  . Heart murmur    "ONLY DETECTED WHEN LYING DOWN"  - 10/16/2018- Not concerned with  . History of Lyme disease 09/19/2016  . Hyperlipidemia   . Hypertension   . Lyme disease    Pt states she has had Lyme Disease twice.   . Nocturia   . Pneumonia   . PONV (postoperative nausea and vomiting)    Nausea  . S/P laparoscopic cholecystectomy 12/23/2014  . Seizure disorder ([place])   . Seizures ([place])   . Seizures ([place])    "last one 10- 15 years ago - 10/16/2018  . 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 Trimalleolar fracture    left  . Tubular adenoma of colon 06/27/2018    MEDICATIONS: Current Outpatient Medications on File Prior to Visit  Medication Sig Dispense Refill  . ALPRAZolam (XANAX) 1 MG tablet Take 0.5 tablets (0.5 mg total) by mouth at bedtime. 30 tablet 0  . amLODipine (NORVASC) 5 MG tablet Take 1 tablet (5 mg total) by mouth daily. 90 tablet 3  . Ascorbic Acid (VITAMIN C) 500 MG CAPS Take 500 mg by mouth daily.     Marland Kitchen CALCIUM-MAGNESIUM PO Take 1 tablet by mouth daily.    . cetirizine (ZYRTEC) 10 MG tablet Take 10 mg by mouth daily as needed for allergies.     . Cholecalciferol (VITAMIN D3) 250 MCG (10000 UT) capsule Take 10,000 Units by mouth daily.     .  hydrocortisone 2.5 % cream Apply topically 2 (two) times daily as needed (Rash). (Patient taking differently: Apply 1 application topically 2 (two) times daily as needed (Rash).) 30 g 11  . hydrOXYzine (ATARAX/VISTARIL) 10 MG tablet Take 1-2 tablets (10-20 mg total) by mouth at bedtime. 60 tablet 5  . ketoconazole (NIZORAL) 2 % shampoo Apply 1 application topically daily as needed for itching.    . metroNIDAZOLE (METROCREAM) 0.75 % cream Apply topically in the morning and at bedtime. (Patient taking differently: Apply 1 application topically at bedtime as needed (Rosacea).) 45 g 2  . Multiple Vitamin (MULTIVITAMIN WITH MINERALS) TABS tablet Take 1 tablet by mouth daily.    . Omega-3 Fatty Acids (SALMON OIL-1000 PO) Take 1,000 mg by mouth daily.    . Oxcarbazepine (TRILEPTAL) 300 MG tablet Take 1 tablet (300mg ) by mouth twice daily 180 tablet 3  . Potassium 99 MG TABS Take 99 mg by mouth daily.    . Red Yeast Rice 600 MG CAPS Take 1,200 mg by mouth daily.    . Turmeric 500 MG  CAPS Take 500 mg by mouth daily.    . valACYclovir (VALTREX) 500 MG tablet TAKE 1 TABLET (500 MG TOTAL) BY MOUTH DAILY AS NEEDED (COLD SORES). 30 tablet 0  . Zinc 30 MG CAPS Take 30 mg by mouth daily.     No current facility-administered medications on file prior to visit.    ALLERGIES: Allergies  Allergen Reactions  . Amoxicillin-Pot Clavulanate Nausea And Vomiting    Patient denies knowledge of this allergy  Did it involve swelling of the face/tongue/throat, SOB, or low BP? No Did it involve sudden or severe rash/hives, skin peeling, or any reaction on the inside of your mouth or nose? No Did you need to seek medical attention at a hospital or doctor's office? No When did it last happen?in 2016 If all above answers are "NO", may proceed with cephalosporin use.   . Suvorexant Other (See Comments)    Nightmares     FAMILY HISTORY: Family History  Problem Relation Age of Onset  . Arthritis Father   .  Alcohol abuse Mother        drinker and smoker  . Diabetes Mother   . Heart disease Mother   . Hypertension Mother   . Stroke Mother   . Depression Sister   . Hearing loss Sister   . Hyperlipidemia Sister   . Alcohol abuse Daughter   . Alcohol abuse Son   . Depression Sister   . Early death Sister   . Alcohol abuse Brother   . Depression Sister   . Heart disease Sister   . Colon cancer Neg Hx   . Esophageal cancer Neg Hx   . Inflammatory bowel disease Neg Hx   . Liver disease Neg Hx   . Pancreatic cancer Neg Hx   . Rectal cancer Neg Hx   . Stomach cancer Neg Hx     SOCIAL HISTORY: Social History   Socioeconomic History  . Marital status: Single    Spouse name: Not on file  . Number of children: 4  . Years of education: 25  . Highest education level: Not on file  Occupational History  . Occupation: retired    Comment: Accounts payable and nanny   Tobacco Use  . Smoking status: Never Smoker  . Smokeless tobacco: Never Used  Vaping Use  . Vaping Use: Never used  Substance and Sexual Activity  . Alcohol use: No    Alcohol/week: 0.0 standard drinks    Comment: sober for 47 years-noted 09/16/2018  . Drug use: No  . Sexual activity: Not Currently  Other Topics Concern  . Not on file  Social History Narrative   Patient lives alone.   patient has 4 grandchildren.   Patient is retired   Patient has 2 years of college.   Patient is right handed.         Social Determinants of Health   Financial Resource Strain: Not on file  Food Insecurity: Not on file  Transportation Needs: Not on file  Physical Activity: Not on file  Stress: Not on file  Social Connections: Not on file  Intimate Partner Violence: Not on file     PHYSICAL EXAM: Vitals:   02/26/20 1533  BP: (!) 146/77  Pulse: 77  SpO2: 98%   General: No acute distress Head:  Normocephalic/atraumatic Skin/Extremities: No rash, no edema Neurological Exam: alert and awake. No aphasia or dysarthria.  Fund of knowledge is appropriate.  Recent and remote memory are intact.  Attention and concentration  are normal.   Cranial nerves: Pupils equal, round. Extraocular movements intact with no nystagmus. Visual fields full.  No facial asymmetry.  Motor: Bulk and tone normal, muscle strength 5/5 throughout with no pronator drift.   Finger to nose testing intact.  Gait narrow-based and steady, able to tandem walk adequately.  Romberg negative.   IMPRESSION: This is an 80 yo RH woman with a history of episodes of loss of consciousness as a teenager, and 2 witnessed convulsions in 2008 and 2013. The last seizure occurred in the setting of reduced Trileptal dose and not taking lorazepam the night prior. Prior EEGs have shown left frontotemporal sharp waves, suggestive of focal to bilateral tonic-clonic seizures. She has been seizure-free since 2013 on oxcarbazepine 300mg  BID, refills sent. She is aware of [place] driving laws and knows to stop driving after a seizure until 6 months seizure-free. Follow-up in 1 year, she knows to call for any changes.    Thank you for allowing me to participate in her care.  Please do not hesitate to call for any questions or concerns.   Ellouise Newer, M.D.   CC: Dr. [name]

## 2020-02-26 NOTE — Patient Instructions (Signed)
Good to see you. Continue Trileptal 300mg  twice a day, refills have been sent for a year. Follow-up in 1 year, call for any changes.   Seizure Precautions: 1. If medication has been prescribed for you to prevent seizures, take it exactly as directed.  Do not stop taking the medicine without talking to your doctor first, even if you have not had a seizure in a long time.   2. Avoid activities in which a seizure would cause danger to yourself or to others.  Don't operate dangerous machinery, swim alone, or climb in high or dangerous places, such as on ladders, roofs, or girders.  Do not drive unless your doctor says you may.  3. If you have any warning that you may have a seizure, lay down in a safe place where you can't hurt yourself.    4.  No driving for 6 months from last seizure, as per [facility].   Please refer to the following link on the [place] website for more information: http://[email or web address].org/answerplace/Social/driving/drivingu.cfm   5.  Maintain good sleep hygiene. Avoid alcohol.  6.  Contact your doctor if you have any problems that may be related to the medicine you are taking.  7.  Call 911 and bring the patient back to the ED if:        A.  The seizure lasts longer than 5 minutes.       B.  The patient doesn't awaken shortly after the seizure  C.  The patient has new problems such as difficulty seeing, speaking or moving  D.  The patient was injured during the seizure  E.  The patient has a temperature over 102 F (39C)  F.  The patient vomited and now is having trouble breathing

## 2020-02-27 DIAGNOSIS — N390 Urinary tract infection, site not specified: Secondary | ICD-10-CM | POA: Diagnosis not present

## 2020-02-27 DIAGNOSIS — Z6824 Body mass index (BMI) 24.0-24.9, adult: Secondary | ICD-10-CM | POA: Diagnosis not present

## 2020-03-04 ENCOUNTER — Encounter: Payer: Self-pay | Admitting: Family Medicine

## 2020-03-04 ENCOUNTER — Ambulatory Visit (INDEPENDENT_AMBULATORY_CARE_PROVIDER_SITE_OTHER): Payer: Medicare Other | Admitting: Family Medicine

## 2020-03-04 ENCOUNTER — Other Ambulatory Visit: Payer: Self-pay

## 2020-03-04 VITALS — BP 116/73 | HR 65 | Temp 97.8°F | Ht 62.0 in | Wt 135.2 lb

## 2020-03-04 DIAGNOSIS — I10 Essential (primary) hypertension: Secondary | ICD-10-CM | POA: Diagnosis not present

## 2020-03-04 DIAGNOSIS — E785 Hyperlipidemia, unspecified: Secondary | ICD-10-CM

## 2020-03-04 DIAGNOSIS — N390 Urinary tract infection, site not specified: Secondary | ICD-10-CM

## 2020-03-04 LAB — POCT URINALYSIS DIPSTICK
Bilirubin, UA: NEGATIVE
Blood, UA: NEGATIVE
Glucose, UA: NEGATIVE
Ketones, UA: NEGATIVE
Leukocytes, UA: NEGATIVE
Nitrite, UA: NEGATIVE
Protein, UA: NEGATIVE
Spec Grav, UA: 1.025 (ref 1.010–1.025)
Urobilinogen, UA: 0.2 E.U./dL
pH, UA: 6 (ref 5.0–8.0)

## 2020-03-04 MED ORDER — AMLODIPINE BESYLATE 5 MG PO TABS: 2.5000 mg | ORAL_TABLET | Freq: Every day | ORAL | 3 refills | Status: DC

## 2020-03-04 NOTE — Progress Notes (Signed)
   Joan Mclaughlin is a 80 y.o. female who presents today for an office visit.  Assessment/Plan:  New/Acute Problems: UTI Reassuring that symptoms have improved with Keflex.  Recommend she complete her course of Keflex.  Will check urine culture to confirm eradication.  Encourage good oral hydration.  Discussed reasons to return to care.  Chronic Problems Addressed Today: Essential hypertension At goal.  Has been very well controlled in the past.  Will decrease amlodipine to 2.5 mg daily.  She will continue monitoring at home.  Dyslipidemia Not currently on any medications. Will need to check lipids with next blood draw.     Subjective:  HPI:  Patient here for UTI follow-up.  Went to urgent care 6 days ago.  Was started on Keflex.  Was having some pressure, burning, and urgency at that time.  She was started on Keflex.  Symptoms have improved significantly.  She would like to make sure that she is on the right medication today.  She has not had any follow-up with urgent care.  Symptoms are improving.        Objective:  Physical Exam: BP 116/73   Pulse 65   Temp 97.8 F (36.6 C) (Temporal)   Ht 5\' 2"  (1.575 m)   Wt 135 lb 3.2 oz (61.3 kg)   SpO2 99%   BMI 24.73 kg/m   Gen: No acute distress, resting comfortably Neuro: Grossly normal, moves all extremities Psych: Normal affect and thought content      Joan Stuckey M. [name], MD 03/04/2020 1:33 PM

## 2020-03-04 NOTE — Assessment & Plan Note (Signed)
At goal.  Has been very well controlled in the past.  Will decrease amlodipine to 2.5 mg daily.  She will continue monitoring at home.

## 2020-03-04 NOTE — Patient Instructions (Signed)
It was very nice to see you today!  Please pressure medication in half.  Please finish your course of antibiotics.  We will give you a call next week if we need to make any medication changes.  Take care, Dr [name]  Please try these tips to maintain a healthy lifestyle:   Eat at least 3 REAL meals and 1-2 snacks per day.  Aim for no more than 5 hours between eating.  If you eat breakfast, please do so within one hour of getting up.    Each meal should contain half fruits/vegetables, one quarter protein, and one quarter carbs (no bigger than a computer mouse)   Cut down on sweet beverages. This includes juice, soda, and sweet tea.     Drink at least 1 glass of water with each meal and aim for at least 8 glasses per day   Exercise at least 150 minutes every week.

## 2020-03-04 NOTE — Assessment & Plan Note (Signed)
Not currently on any medications. Will need to check lipids with next blood draw.

## 2020-03-06 LAB — URINE CULTURE
MICRO NUMBER:: 11580466
Result:: NO GROWTH
SPECIMEN QUALITY:: ADEQUATE

## 2020-03-07 NOTE — Progress Notes (Signed)
Please inform patient of the following:  Urine culture is negative. She does not need any more antibiotics.  [name]. [name], MD 03/07/2020 9:51 AM

## 2020-03-10 ENCOUNTER — Ambulatory Visit: Payer: Medicare Other

## 2020-03-17 ENCOUNTER — Encounter: Payer: Medicare Other | Admitting: Dermatology

## 2020-04-07 ENCOUNTER — Ambulatory Visit (INDEPENDENT_AMBULATORY_CARE_PROVIDER_SITE_OTHER): Payer: Medicare Other | Admitting: Dermatology

## 2020-04-07 ENCOUNTER — Other Ambulatory Visit: Payer: Self-pay

## 2020-04-07 ENCOUNTER — Encounter: Payer: Self-pay | Admitting: Dermatology

## 2020-04-07 DIAGNOSIS — D0472 Carcinoma in situ of skin of left lower limb, including hip: Secondary | ICD-10-CM

## 2020-04-07 DIAGNOSIS — D099 Carcinoma in situ, unspecified: Secondary | ICD-10-CM

## 2020-04-07 MED ORDER — MUPIROCIN 2 % EX OINT: 1.0000 "application " | TOPICAL_OINTMENT | Freq: Two times a day (BID) | CUTANEOUS | 1 refills | Status: DC

## 2020-04-07 NOTE — Patient Instructions (Signed)

## 2020-04-11 ENCOUNTER — Ambulatory Visit: Payer: Medicare Other | Admitting: Neurology

## 2020-04-12 ENCOUNTER — Other Ambulatory Visit: Payer: Self-pay | Admitting: Family Medicine

## 2020-04-12 ENCOUNTER — Other Ambulatory Visit: Payer: Self-pay

## 2020-04-12 NOTE — Telephone Encounter (Signed)
MEDICATION: alprazolam 1 MG, erythromycin ophthalmic ointment  PHARMACY: [facility] [id number]   Comments:   **Let patient know to contact pharmacy at the end of the day to make sure medication is ready. **  ** Please notify patient to allow 48-72 hours to process**  **Encourage patient to contact the pharmacy for refills or they can request refills through [facility]**

## 2020-04-13 MED ORDER — ALPRAZOLAM 1 MG PO TABS
0.5000 mg | ORAL_TABLET | Freq: Every day | ORAL | 0 refills | Status: DC
Start: 2020-04-13 — End: 2020-05-16

## 2020-04-20 ENCOUNTER — Encounter: Payer: Self-pay | Admitting: Dermatology

## 2020-04-22 NOTE — Progress Notes (Signed)
   Follow-Up Visit   Subjective  Joan Mclaughlin is a 80 y.o. female who presents for the following: Procedure (Here for treatment- left lower leg- anterior- scc x 1 ).  CIS left leg Location:  Duration:  Quality:  Associated Signs/Symptoms: Modifying Factors:  Severity:  Timing: Context: For treatment  Objective  Well appearing patient in no apparent distress; mood and affect are within normal limits. Objective  Left Lower Leg - Anterior: Lesion identified by Dr.Kihanna Kamiya and nurse in room.      A focused examination was performed including Legs. Relevant physical exam findings are noted in the Assessment and Plan.   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  Destruction of lesion Complexity: simple   Destruction method: electrodesiccation and curettage   Informed consent: discussed and consent obtained   Timeout:  patient name, date of birth, surgical site, and procedure verified Anesthesia: the lesion was anesthetized in a standard fashion   Anesthetic:  1% lidocaine w/ epinephrine 1-100,000 local infiltration Curettage performed in three different directions: Yes   Curettage cycles:  3 Lesion length (cm):  1.5 Lesion width (cm):  1.5 Margin per side (cm):  0 Final wound size (cm):  1.5 Hemostasis achieved with:  ferric subsulfate Outcome: patient tolerated procedure well with no complications   Additional details:  Wound innoculated with 5 fluorouracil solution.  I emphasized preop the challenges with healing a lower extremity wound.  Discussed techniques to minimize venous pressure.  Call if there is any sign of infection.      I, Lavonna Monarch, MD, have reviewed all documentation for this visit.  The documentation on 04/22/20 for the exam, diagnosis, procedures, and orders are all accurate and complete.

## 2020-04-27 ENCOUNTER — Encounter: Payer: Self-pay | Admitting: Family Medicine

## 2020-04-27 ENCOUNTER — Other Ambulatory Visit: Payer: Self-pay

## 2020-04-27 ENCOUNTER — Ambulatory Visit (INDEPENDENT_AMBULATORY_CARE_PROVIDER_SITE_OTHER): Payer: Medicare Other | Admitting: Family Medicine

## 2020-04-27 VITALS — BP 130/76 | HR 77 | Temp 97.3°F | Wt 135.8 lb

## 2020-04-27 DIAGNOSIS — J309 Allergic rhinitis, unspecified: Secondary | ICD-10-CM

## 2020-04-27 DIAGNOSIS — I10 Essential (primary) hypertension: Secondary | ICD-10-CM | POA: Diagnosis not present

## 2020-04-27 DIAGNOSIS — R42 Dizziness and giddiness: Secondary | ICD-10-CM | POA: Diagnosis not present

## 2020-04-27 LAB — URINALYSIS, ROUTINE W REFLEX MICROSCOPIC
Bilirubin Urine: NEGATIVE
Hgb urine dipstick: NEGATIVE
Ketones, ur: NEGATIVE
Leukocytes,Ua: NEGATIVE
Nitrite: NEGATIVE
Specific Gravity, Urine: 1.02 (ref 1.000–1.030)
Total Protein, Urine: NEGATIVE
Urine Glucose: NEGATIVE
Urobilinogen, UA: 0.2 (ref 0.0–1.0)
pH: 6 (ref 5.0–8.0)

## 2020-04-27 NOTE — Assessment & Plan Note (Signed)
Stable on over-the-counter meds. 

## 2020-04-27 NOTE — Patient Instructions (Signed)
It was very nice to see you today!  I think you are probably dehydrated.  We will check your urine today to assess your dehydration level.  Please make sure that you are getting 8 glasses of water daily.  Please let me know if your symptoms return or worsen.  Take care, Dr [name]  PLEASE NOTE:  If you had any lab tests please let us know if you have not heard back within a few days. You may see your results on mychart before we have a chance to review them but we will give you a call once they are reviewed by [place]. If we ordered any referrals today, please let us know if you have not heard from their office within the next week.   Please try these tips to maintain a healthy lifestyle:   Eat at least 3 REAL meals and 1-2 snacks per day.  Aim for no more than 5 hours between eating.  If you eat breakfast, please do so within one hour of getting up.    Each meal should contain half fruits/vegetables, one quarter protein, and one quarter carbs (no bigger than a computer mouse)   Cut down on sweet beverages. This includes juice, soda, and sweet tea.     Drink at least 1 glass of water with each meal and aim for at least 8 glasses per day   Exercise at least 150 minutes every week.

## 2020-04-27 NOTE — Assessment & Plan Note (Signed)
At goal on amlodipine 2.5 mg daily.  Blood pressure 130/76 today.  Do not think this is contributing to her above symptoms.

## 2020-04-27 NOTE — Progress Notes (Signed)
   Joan Mclaughlin is a 80 y.o. female who presents today for an office visit.  Assessment/Plan:  New/Acute Problems: Presyncope No red flags.  Likely due to dehydration.  Given lack of syncopal events and other red flag findings and reassuring exam do not think we need to pursue further work-up at this point.  We will check a UA to assess hydration status.  Encourage patient to aim for 8 glasses of water daily.  She will let me know if symptoms occur again or if she has any worsening symptoms and we can do blood work and/or imaging at that time if needed.  Stress She is seeing a counselor which is going well.  Chronic Problems Addressed Today: Essential hypertension At goal on amlodipine 2.5 mg daily.  Blood pressure 130/76 today.  Do not think this is contributing to her above symptoms.  Allergic rhinitis Stable on over-the-counter meds.     Subjective:  HPI:  Patient here with concerns for dizziness.  Has been going on for the last couple weeks.  Has had symptoms twice.  First when she was sitting down.  She felt like the blood was draining out of her head.  Did not lose consciousness.  She laid her head down and symptoms improved after a few moments.  No vertigo.  No weakness or numbness.  No palpitations.  No chest Mclaughlin or shortness of breath.  She had something similar happen several days ago while she was standing.  Again symptoms lasted only momentarily and then resolved.  Did not have any other associated symptoms at that time.  She does admit she has been under quite a bit of stress recently due to the health of her daughter.  She has been seeing a counselor which seems to be helping with this.  No recent illnesses.  No fevers or chills.  No nausea or vomiting.  She does think she has been slightly dehydrated and has not been keeping up with her water intake.       Objective:  Physical Exam: BP 130/76   Pulse 77   Temp (!) 97.3 F (36.3 C) (Temporal)   Wt 135 lb 12.8  oz (61.6 kg)   SpO2 99%   BMI 24.84 kg/m   Gen: No acute distress, resting comfortably HEENT: TMs clear. CV: Regular rate and rhythm with no murmurs appreciated.  No carotid bruits.  Distal pulses intact Pulm: Normal work of breathing, clear to auscultation bilaterally with no crackles, wheezes, or rhonchi Neuro: Cranial nerves II through XII intact.  Strength out of 5 in upper and lower extremities. Psych: Normal affect and thought content      Leasa Kincannon M. Joan Pain, MD 04/27/2020 8:25 AM

## 2020-04-28 NOTE — Progress Notes (Signed)
Please inform patient of the following:  She is not severely dehydrated based off her urine sample but does need to increase her fluid intake as we discussed. Would like for her to let us know if her symptoms are not improving.  [name]. [name], MD 04/28/2020 3:15 PM

## 2020-05-10 ENCOUNTER — Encounter: Payer: Self-pay | Admitting: Family Medicine

## 2020-05-10 ENCOUNTER — Ambulatory Visit (INDEPENDENT_AMBULATORY_CARE_PROVIDER_SITE_OTHER): Payer: Medicare Other | Admitting: Family Medicine

## 2020-05-10 ENCOUNTER — Other Ambulatory Visit: Payer: Self-pay

## 2020-05-10 VITALS — BP 131/75 | HR 69 | Temp 97.9°F | Ht 62.0 in | Wt 135.8 lb

## 2020-05-10 DIAGNOSIS — K5790 Diverticulosis of intestine, part unspecified, without perforation or abscess without bleeding: Secondary | ICD-10-CM | POA: Diagnosis not present

## 2020-05-10 MED ORDER — METRONIDAZOLE 500 MG PO TABS: 500.0000 mg | ORAL_TABLET | Freq: Four times a day (QID) | ORAL | 0 refills | Status: AC

## 2020-05-10 MED ORDER — CIPROFLOXACIN HCL 500 MG PO TABS: 500.0000 mg | ORAL_TABLET | Freq: Two times a day (BID) | ORAL | 0 refills | Status: AC

## 2020-05-10 NOTE — Patient Instructions (Signed)
It was very nice to see you today!  You have a flareup of your diverticulitis.  Please start the antibiotics.  Let me know if not improving.  Take care, Dr [name]  PLEASE NOTE:  If you had any lab tests please let us know if you have not heard back within a few days. You may see your results on mychart before we have a chance to review them but we will give you a call once they are reviewed by [place]. If we ordered any referrals today, please let us know if you have not heard from their office within the next week.   Please try these tips to maintain a healthy lifestyle:   Eat at least 3 REAL meals and 1-2 snacks per day.  Aim for no more than 5 hours between eating.  If you eat breakfast, please do so within one hour of getting up.    Each meal should contain half fruits/vegetables, one quarter protein, and one quarter carbs (no bigger than a computer mouse)   Cut down on sweet beverages. This includes juice, soda, and sweet tea.     Drink at least 1 glass of water with each meal and aim for at least 8 glasses per day   Exercise at least 150 minutes every week.

## 2020-05-10 NOTE — Progress Notes (Signed)
   Joan Mclaughlin is a 80 y.o. female who presents today for an office visit.  Assessment/Plan:  Chronic Problems Addressed Today: Diverticulosis Appears to have mild flare of diverticulitis based on history.  No red flag signs or symptoms.  Given her history of severe diverticulitis in the past that resulted in hospitalization we will start outpatient antibiotic with Cipro Flagyl for the next 7 days.  Encourage good hydration.  Discussed reasons to return to care.  Follow-up as needed.   Subjective:  HPI:  Patient here with concerns for diverticulitis.  Symptoms started 2 or 3 days ago.  And pain in the left lower quadrant.  Some bloating sensation.  Not diarrhea.  No fevers or chills.  She had diverticulitis in the past few years ago.  Ended up being in hospital a few days for this.  She had some leftover ciprofloxacin from several years ago and started taking this.  This is helped modestly.       Objective:  Physical Exam: BP 131/75   Pulse 69   Temp 97.9 F (36.6 C) (Temporal)   Ht 5\' 2"  (1.575 m)   Wt 135 lb 12.8 oz (61.6 kg)   SpO2 98%   BMI 24.84 kg/m   Gen: No acute distress, resting comfortably CV: Regular rate and rhythm with no murmurs appreciated Pulm: Normal work of breathing, clear to auscultation bilaterally with no crackles, wheezes, or rhonchi GI: Soft, mildly tender to left lower quadrant.  No rebound or guarding Neuro: Grossly normal, moves all extremities Psych: Normal affect and thought content      Tonga Prout M. [name], MD 05/10/2020 12:14 PM

## 2020-05-16 ENCOUNTER — Other Ambulatory Visit: Payer: Self-pay | Admitting: Family Medicine

## 2020-05-16 ENCOUNTER — Telehealth: Payer: Self-pay

## 2020-05-16 NOTE — Telephone Encounter (Signed)
Patient requesting Rx Xanax refill

## 2020-05-16 NOTE — Telephone Encounter (Signed)
MEDICATION: ALPRAZolam (XANAX) 1 MG tablet  PHARMACY:  [id number] - [place], [place] - [address] AT [address] Phone:  [phone]  Fax:  [phone]       Comments: Patient stated she was going out of town   **Let patient know to contact pharmacy at the end of the day to make sure medication is ready. **  ** Please notify patient to allow 48-72 hours to process**  **Encourage patient to contact the pharmacy for refills or they can request refills through [facility]**

## 2020-05-16 NOTE — Telephone Encounter (Signed)
Rx sent in.  [name]. [name], MD 05/16/2020 1:03 PM

## 2020-05-17 ENCOUNTER — Telehealth: Payer: Self-pay | Admitting: Family Medicine

## 2020-05-17 NOTE — Chronic Care Management (AMB) (Signed)
  Chronic Care Management   Note  05/17/2020 Name: Joan Mclaughlin MRN: [id number] DOB: 02-06-1940  Joan Mclaughlin is a 80 y.o. year old female who is a primary care patient of Vivi Barrack, MD. I reached out to Joan Mclaughlin by phone today in response to a referral sent by Joan Mclaughlin's PCP, Vivi Barrack, MD.   Joan Mclaughlin was given information about Chronic Care Management services today including:  1. CCM service includes personalized support from designated clinical staff supervised by her physician, including individualized plan of care and coordination with other care providers 2. 24/7 contact phone numbers for assistance for urgent and routine care needs. 3. Service will only be billed when office clinical staff spend 20 minutes or more in a month to coordinate care. 4. Only one practitioner may furnish and bill the service in a calendar month. 5. The patient may stop CCM services at any time (effective at the end of the month) by phone call to the office staff.   Patient agreed to services and verbal consent obtained.   Follow up plan:   Joan Mclaughlin [employer] Scheduler

## 2020-05-18 ENCOUNTER — Telehealth: Payer: Self-pay | Admitting: Gastroenterology

## 2020-05-18 NOTE — Telephone Encounter (Signed)
The pt states she was given cipro and flagyl for diverticulitis by her PCP. She stopped it once she felt better and wants to know if she should finish the course.  The pt has been advised to finish the course and if no better she will call back.

## 2020-05-18 NOTE — Telephone Encounter (Signed)
Inbound call from patient. States she thinks she is experiencing diverticulosis. Urgent to have bowel movement, no diarrhea, PCP gave two different antibiotics (Metronidazole , Ciprosloxacin). Stop taking them because started feeling better. Best contact number [phone]

## 2020-05-20 NOTE — Telephone Encounter (Signed)
The pt has been advised that she should finish her course of antibiotics and call our office if she begins to have symptoms while out of town. The pt has been advised of the information and verbalized understanding.

## 2020-05-20 NOTE — Telephone Encounter (Signed)
Patient called requesting to speak with a nurse about her having diverticulitis. She will be going away and is wanting to know how to do things to prevent another flare up.

## 2020-05-23 ENCOUNTER — Telehealth: Payer: Self-pay

## 2020-05-23 NOTE — Telephone Encounter (Signed)
Patient son called to follow up on a call about his mother symptoms. Asking if patient can be seen soon. Can't eat, watery diarrhea, dehydrated.

## 2020-05-23 NOTE — Telephone Encounter (Signed)
Patient called in with diverticulitis. Have one more antibiotic left to take. Still having urgency to use restroom, pain in left side abd. Request a call back.

## 2020-05-23 NOTE — Telephone Encounter (Signed)
The pt states that she finished abx today for diverticulitis.  She still has diarrhea and not feeling 100%.  Her PCP prescribed the cipro and flagyl course.  I advised her that our first available is 5/24 with Janett Billow.  She did take that appt however I advised her to call her PCP to follow up in the meant time since she is not feeling well. The pt has been advised of the information and verbalized understanding.

## 2020-05-23 NOTE — Telephone Encounter (Signed)
error 

## 2020-05-24 ENCOUNTER — Ambulatory Visit (INDEPENDENT_AMBULATORY_CARE_PROVIDER_SITE_OTHER): Payer: Medicare Other | Admitting: Family Medicine

## 2020-05-24 ENCOUNTER — Encounter: Payer: Self-pay | Admitting: Family Medicine

## 2020-05-24 ENCOUNTER — Other Ambulatory Visit: Payer: Self-pay

## 2020-05-24 VITALS — BP 128/82 | HR 71 | Temp 97.7°F | Resp 19 | Ht 62.0 in | Wt 133.0 lb

## 2020-05-24 DIAGNOSIS — R109 Unspecified abdominal pain: Secondary | ICD-10-CM | POA: Diagnosis not present

## 2020-05-24 DIAGNOSIS — R195 Other fecal abnormalities: Secondary | ICD-10-CM | POA: Diagnosis not present

## 2020-05-24 DIAGNOSIS — K5790 Diverticulosis of intestine, part unspecified, without perforation or abscess without bleeding: Secondary | ICD-10-CM

## 2020-05-24 LAB — CBC WITH DIFFERENTIAL/PLATELET
Basophils Absolute: 0 10*3/uL (ref 0.0–0.1)
Basophils Relative: 0.8 % (ref 0.0–3.0)
Eosinophils Absolute: 0 10*3/uL (ref 0.0–0.7)
Eosinophils Relative: 0.2 % (ref 0.0–5.0)
HCT: 41.5 % (ref 36.0–46.0)
Hemoglobin: 14.4 g/dL (ref 12.0–15.0)
Lymphocytes Relative: 31.1 % (ref 12.0–46.0)
Lymphs Abs: 1.9 10*3/uL (ref 0.7–4.0)
MCHC: 34.7 g/dL (ref 30.0–36.0)
MCV: 92.2 fl (ref 78.0–100.0)
Monocytes Absolute: 0.7 10*3/uL (ref 0.1–1.0)
Monocytes Relative: 11.1 % (ref 3.0–12.0)
Neutro Abs: 3.4 10*3/uL (ref 1.4–7.7)
Neutrophils Relative %: 56.8 % (ref 43.0–77.0)
Platelets: 266 10*3/uL (ref 150.0–400.0)
RBC: 4.5 Mil/uL (ref 3.87–5.11)
RDW: 12.9 % (ref 11.5–15.5)
WBC: 6 10*3/uL (ref 4.0–10.5)

## 2020-05-24 LAB — POCT URINALYSIS DIPSTICK
Bilirubin, UA: NEGATIVE
Blood, UA: NEGATIVE
Glucose, UA: NEGATIVE
Ketones, UA: NEGATIVE
Leukocytes, UA: NEGATIVE
Nitrite, UA: NEGATIVE
Protein, UA: POSITIVE — AB
Spec Grav, UA: 1.01 (ref 1.010–1.025)
Urobilinogen, UA: 0.2 E.U./dL
pH, UA: 6 (ref 5.0–8.0)

## 2020-05-24 LAB — BASIC METABOLIC PANEL
BUN: 12 mg/dL (ref 6–23)
CO2: 30 mEq/L (ref 19–32)
Calcium: 9.5 mg/dL (ref 8.4–10.5)
Chloride: 98 mEq/L (ref 96–112)
Creatinine, Ser: 0.58 mg/dL (ref 0.40–1.20)
GFR: 84.63 mL/min (ref >60.00)
Glucose, Bld: 86 mg/dL (ref 70–99)
Potassium: 4.7 mEq/L (ref 3.5–5.1)
Sodium: 134 mEq/L — ABNORMAL LOW (ref 135–145)

## 2020-05-24 LAB — HEPATIC FUNCTION PANEL
ALT: 23 U/L (ref 0–35)
AST: 23 U/L (ref 0–37)
Albumin: 4.4 g/dL (ref 3.5–5.2)
Alkaline Phosphatase: 62 U/L (ref 39–117)
Bilirubin, Direct: 0.1 mg/dL (ref 0.0–0.3)
Total Bilirubin: 0.5 mg/dL (ref 0.2–1.2)
Total Protein: 7.2 g/dL (ref 6.0–8.3)

## 2020-05-24 NOTE — Assessment & Plan Note (Signed)
New to provider but this has been an issue for pt in the past.  She was recently treated for suspected diverticulitis but stopped her Cipro and Flagyl when she felt better, only to have sxs return.  She finished the remaining course of abx yesterday.  She denies abd pain, has no TTP on exam.  She wants to know if she needs more abx and/or needs imaging.  Due to the contrast shortage a CT scan would not be ideal and since clinically she is asymptomatic it is not needed at this time.  She denies diarrhea but stated she was having some loose stools.  I suspect this was due to the Cipro/flagyl.  Will get CBC to assess WBC and electrolytes.  Based on the results we will determine the next steps.

## 2020-05-24 NOTE — Addendum Note (Signed)
Addended by: Eddie North C on: 05/24/2020 02:02 PM   Modules accepted: Orders

## 2020-05-24 NOTE — Patient Instructions (Signed)
Follow up w/ Dr [name] as needed or as scheduled We'll notify you of your lab results and make any changes if needed Make sure you are drinking LOTS of fluids Continue to eat a bland diet and advance as tolerated (based on pain) I suspect the stools will firm up and the nausea will improve as the Flagyl gets out of your system Call with any questions or concerns- particularly if things change Hang in there!!

## 2020-05-24 NOTE — Progress Notes (Signed)
   Subjective:    Patient ID: Joan Mclaughlin, female    DOB: 1940/12/17, 80 y.o.   MRN: [id number]  HPI ? Diverticulitis- pt was seen on 5/3 and started on Cipro and Flagyl for possible diverticulitis.  She took 4 days of medication and stopped when she started feeling better.  A few days went by and she started to feel worse so GI instructed her to finish the abx.  She finished medication yesterday.  Pt reports she isn't 'a great water drinker'.  Yesterday she felt unsteady on her feet, 'I felt really really awful', 'I found out what real dehydration felt like' but she was not having diarrhea.    No blood in stool.  + decreased appetite.  'I don't have real pain but sometimes I feel something here' (along L flank/lateral abdomen)  Some low back pain today.  Pt reports she has been having bowel changes 'for the last few months'.     Review of Systems For ROS see HPI   This visit occurred during the SARS-CoV-2 public health emergency.  Safety protocols were in place, including screening questions prior to the visit, additional usage of staff PPE, and extensive cleaning of exam room while observing appropriate contact time as indicated for disinfecting solutions.       Objective:   Physical Exam Vitals reviewed.  Constitutional:      General: She is not in acute distress.    Appearance: She is not ill-appearing.  HENT:     Head: Normocephalic and atraumatic.  Cardiovascular:     Rate and Rhythm: Normal rate and regular rhythm.     Pulses: Normal pulses.  Pulmonary:     Effort: Pulmonary effort is normal. No respiratory distress.     Breath sounds: Normal breath sounds. No wheezing.  Abdominal:     General: Abdomen is flat. There is no distension.     Palpations: Abdomen is soft.     Tenderness: There is no abdominal tenderness. There is no right CVA tenderness, left CVA tenderness, guarding or rebound.  Musculoskeletal:     Right lower leg: No edema.     Left lower leg: No  edema.  Skin:    General: Skin is warm and dry.     Findings: No rash.  Neurological:     General: No focal deficit present.     Mental Status: She is alert and oriented to person, place, and time.           Assessment & Plan:  Loose stools- pt denies diarrhea but indicated that yesterday stools were loose.  I suspect this is abx related rather than a true GI illness as pt is otherwise asymptomatic.  I was trying to pin down why she thought she was dehydrated yesterday as she was not having watery or voluminous stools and per her reports she has 'always been a bad water drinker'.  Will check electrolytes to assess for any imbalance or abnormality.  L flank pain- pt reports she had some L flank pain yesterday and today has LBP.  Will check UA and if needed send for culture but she did just complete course of Cipro yesterday.  UA unremarkable but will send for culture just to be sure.

## 2020-05-25 ENCOUNTER — Telehealth: Payer: Self-pay

## 2020-05-25 LAB — URINE CULTURE
MICRO NUMBER:: 11901170
Result:: NO GROWTH
SPECIMEN QUALITY:: ADEQUATE

## 2020-05-25 NOTE — Telephone Encounter (Signed)
Pt returned your call about lab results. Please advise.

## 2020-05-25 NOTE — Telephone Encounter (Signed)
Called patient with lab results. Patient voiced understanding.

## 2020-05-31 ENCOUNTER — Ambulatory Visit (INDEPENDENT_AMBULATORY_CARE_PROVIDER_SITE_OTHER): Payer: Medicare Other | Admitting: Gastroenterology

## 2020-05-31 ENCOUNTER — Encounter: Payer: Self-pay | Admitting: Gastroenterology

## 2020-05-31 ENCOUNTER — Other Ambulatory Visit: Payer: Self-pay | Admitting: *Deleted

## 2020-05-31 VITALS — BP 130/76 | HR 76 | Ht 61.5 in | Wt 133.2 lb

## 2020-05-31 DIAGNOSIS — D126 Benign neoplasm of colon, unspecified: Secondary | ICD-10-CM

## 2020-05-31 DIAGNOSIS — Z86010 Personal history of colonic polyps: Secondary | ICD-10-CM

## 2020-05-31 MED ORDER — NA SULFATE-K SULFATE-MG SULF 17.5-3.13-1.6 GM/177ML PO SOLN: 1.0000 | Freq: Once | ORAL | 0 refills | Status: AC

## 2020-05-31 NOTE — Progress Notes (Signed)
cbc

## 2020-05-31 NOTE — Patient Instructions (Addendum)
If you are age 82 or older, your body mass index should be between 23-30. Your Body mass index is 24.77 kg/m. If this is out of the aforementioned range listed, please consider follow up with your Primary Care Provider.  If you are age 38 or younger, your body mass index should be between 19-25. Your Body mass index is 24.77 kg/m. If this is out of the aformentioned range listed, please consider follow up with your Primary Care Provider.   You have been scheduled for a colonoscopy. Please follow written instructions given to you at your visit today.  Please pick up your prep supplies at the pharmacy within the next 1-3 days. If you use inhalers (even only as needed), please bring them with you on the day of your procedure.  Due to recent changes in healthcare laws, you may see the results of your imaging and laboratory studies on MyChart before your provider has had a chance to review them.  We understand that in some cases there may be results that are confusing or concerning to you. Not all laboratory results come back in the same time frame and the provider may be waiting for multiple results in order to interpret others.  Please give [place] 48 hours in order for your provider to thoroughly review all the results before contacting the office for clarification of your results.   The [facility] providers would like to encourage you to use [facility] to communicate with providers for non-urgent requests or questions.  Due to long hold times on the telephone, sending your provider a message by [facility] may be a faster and more efficient way to get a response.  Please allow 48 business hours for a response.  Please remember that this is for non-urgent requests.

## 2020-05-31 NOTE — Progress Notes (Signed)
05/31/2020 Joan Mclaughlin [id number] [birth year]   HISTORY OF PRESENT ILLNESS: This is an 80 year old female who is a patient of Dr. Donneta Romberg.  He has been following her since 2020 with periodic colonoscopies for history of large polyps that have been removed.  Last colonoscopy October 2021 showed the following:  - Hemorrhoids found on digital rectal exam. - The examined portion of the ileum was normal. - Four 3 to 4 mm polyps in the transverse colon and cecum and in the ascending colon, removed with cold snare. Resected and retrieved. - A tattoo was seen in the transverse colon. A post-polypectomy scar was found at the tattoo site with adjacent 25 mm polypoid lesion in the transverse colon. Complete removal was accomplished with piecemeal underwater resection. Clips (MR conditional) were placed to decrease risk of post-interventional bleeding. - Normal mucosa in the entire examined colon otherwise. - Non-bleeding non-thrombosed external and internal hemorrhoids.  Polyps were tubular adenomas with no high-grade dysplasia or malignancy identified.  She is here today for follow-up.  She was recently treated empirically for diverticulitis by her PCP with Cipro and Flagyl.  She admits that initially she only took the antibiotics for a few days because she was feeling better, but then shortly after that she began feeling poorly again so she ended up completing the course of antibiotics.  She says that last Monday she really did not feel well at all.  At this point she is feeling better and has been feeling better for the past several days.  CBC, CMP, urinalysis last week were unremarkable.   Past Medical History:  Diagnosis Date  . Alcoholism ([place])    sober for 47 years-noted 09/16/2018  . Allergy   . Anxiety   . Cancer ([place])    skin cancer  . Cholecystitis   . Complication of anesthesia   . Difficulty sleeping    takes med to sleep  . Diverticulitis   . Family history of  adverse reaction to anesthesia    [place] - N/V  . Gallstones   . Ganglion cyst 12/11/2016  . GERD (gastroesophageal reflux disease)    "RELATED TO GALLBLADDER"  . Heart murmur    "ONLY DETECTED WHEN LYING DOWN"  - 10/16/2018- Not concerned with  . History of Lyme disease 09/19/2016  . Hyperlipidemia   . Hypertension   . Lyme disease    Pt states she has had Lyme Disease twice.   . Nocturia   . Pneumonia   . PONV (postoperative nausea and vomiting)    Nausea  . S/P laparoscopic cholecystectomy 12/23/2014  . Seizure disorder ([place])   . Seizures ([place])   . Seizures ([place])    "last one 10- 15 years ago - 10/16/2018  . 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 Trimalleolar fracture    left  . Tubular adenoma of colon 06/27/2018   Past Surgical History:  Procedure Laterality Date  . CHOLECYSTECTOMY N/A 12/23/2014   Procedure: LAPAROSCOPIC CHOLECYSTECTOMY WITH INTRAOPERATIVE CHOLANGIOGRAM;  Surgeon: Greer Pickerel, MD;  Location: [facility];  Service: General;  Laterality: N/A;  . COLONOSCOPY     "3 times trying to get large polyp since August 2020"  . COLONOSCOPY WITH PROPOFOL N/A 10/20/2018   Procedure: COLONOSCOPY WITH PROPOFOL;  Surgeon: Rush Landmark Telford Nab., MD;  Location: [place];  Service: Gastroenterology;  Laterality: N/A;  . COLONOSCOPY WITH  PROPOFOL N/A 11/02/2019   Procedure: COLONOSCOPY WITH PROPOFOL;  Surgeon: Rush Landmark Telford Nab., MD;  Location: [place];  Service: Gastroenterology;  Laterality: N/A;  . ENDOSCOPIC MUCOSAL RESECTION N/A 10/20/2018   Procedure: ENDOSCOPIC MUCOSAL RESECTION;  Surgeon: Rush Landmark Telford Nab., MD;  Location: [place];  Service: Gastroenterology;  Laterality: N/A;  . ENDOSCOPIC MUCOSAL RESECTION  11/02/2019   Procedure: ENDOSCOPIC MUCOSAL RESECTION;  Surgeon: Rush Landmark, Telford Nab., MD;  Location: [place];  Service:  Gastroenterology;;  . HEMOSTASIS CLIP PLACEMENT  10/20/2018   Procedure: HEMOSTASIS CLIP PLACEMENT;  Surgeon: Irving Copas., MD;  Location: [place];  Service: Gastroenterology;;  . HEMOSTASIS CLIP PLACEMENT  11/02/2019   Procedure: HEMOSTASIS CLIP PLACEMENT;  Surgeon: Irving Copas., MD;  Location: [place];  Service: Gastroenterology;;  . ORIF ANKLE FRACTURE Left 02/19/2019   Procedure: OPEN REDUCTION INTERNAL FIXATION (ORIF) LEFT ANKLE FRACTURE;  Surgeon: Erle Crocker, MD;  Location: [place];  Service: Orthopedics;  Laterality: Left;  . POLYPECTOMY  10/20/2018   Procedure: POLYPECTOMY;  Surgeon: Mansouraty, Telford Nab., MD;  Location: [place];  Service: Gastroenterology;;  . POLYPECTOMY  11/02/2019   Procedure: POLYPECTOMY;  Surgeon: Irving Copas., MD;  Location: [place];  Service: Gastroenterology;;  . Lia Foyer LIFTING INJECTION  10/20/2018   Procedure: SUBMUCOSAL LIFTING INJECTION;  Surgeon: Irving Copas., MD;  Location: [place];  Service: Gastroenterology;;  . SYNDESMOSIS REPAIR Left 02/19/2019   Procedure: SYNDESMOSIS REPAIR;  Surgeon: Erle Crocker, MD;  Location: [place];  Service: Orthopedics;  Laterality: Left;  . THORACIC DISCECTOMY  07/2007   T12  . TUBAL LIGATION    . WISDOM TOOTH EXTRACTION      reports that she has never smoked. She has never used smokeless tobacco. She reports that she does not drink alcohol and does not use drugs. family history includes Alcohol abuse in her brother, daughter, mother, and son; Arthritis in her father; Depression in her sister, sister, and sister; Diabetes in her mother; Early death in her sister; Hearing loss in her sister; Heart disease in her mother and sister; Hyperlipidemia in her sister; Hypertension in her mother; Stroke in her mother. Allergies  Allergen Reactions  . Amoxicillin-Pot Clavulanate Nausea And Vomiting    Patient denies knowledge of this  allergy  Did it involve swelling of the face/tongue/throat, SOB, or low BP? No Did it involve sudden or severe rash/hives, skin peeling, or any reaction on the inside of your mouth or nose? No Did you need to seek medical attention at a hospital or doctor's office? No When did it last happen?in 2016 If all above answers are "NO", may proceed with cephalosporin use.   . Suvorexant Other (See Comments)    Nightmares       Outpatient Encounter Medications as of 05/31/2020  Medication Sig  . ALPRAZolam (XANAX) 1 MG tablet TAKE 1/2 TABLET BY MOUTH AT BEDTIME.  Marland Kitchen amLODipine (NORVASC) 5 MG tablet Take 0.5 tablets (2.5 mg total) by mouth daily.  . Ascorbic Acid (VITAMIN C) 500 MG CAPS Take 500 mg by mouth daily.   Marland Kitchen CALCIUM-MAGNESIUM PO Take 1 tablet by mouth daily.  . cetirizine (ZYRTEC) 10 MG tablet Take 10 mg by mouth daily as needed for allergies.   . Cholecalciferol (VITAMIN D3) 250 MCG (10000 UT) capsule Take 10,000 Units by mouth daily.   . hydrocortisone 2.5 % cream Apply topically 2 (two) times daily as needed (Rash). (Patient taking differently: Apply 1 application topically 2 (two) times daily as  needed (Rash).)  . hydrOXYzine (ATARAX/VISTARIL) 10 MG tablet Take 1-2 tablets (10-20 mg total) by mouth at bedtime.  Marland Kitchen ketoconazole (NIZORAL) 2 % shampoo Apply 1 application topically daily as needed for itching.  . metroNIDAZOLE (METROCREAM) 0.75 % cream Apply topically in the morning and at bedtime. (Patient taking differently: Apply 1 application topically at bedtime as needed (Rosacea).)  . Multiple Vitamin (MULTIVITAMIN WITH MINERALS) TABS tablet Take 1 tablet by mouth daily.  . Omega-3 Fatty Acids (SALMON OIL-1000 PO) Take 1,000 mg by mouth daily.  . Oxcarbazepine (TRILEPTAL) 300 MG tablet Take 1 tablet (300mg ) by mouth twice daily  . Potassium 99 MG TABS Take 99 mg by mouth daily.  . Red Yeast Rice 600 MG CAPS Take 1,200 mg by mouth daily.  . Turmeric 500 MG CAPS Take 500 mg by  mouth daily.  . valACYclovir (VALTREX) 500 MG tablet TAKE 1 TABLET (500 MG TOTAL) BY MOUTH DAILY AS NEEDED (COLD SORES).  . Zinc 30 MG CAPS Take 30 mg by mouth daily.  . [DISCONTINUED] MODERNA COVID-19 VACCINE 100 MCG/0.5ML injection   . [DISCONTINUED] mupirocin ointment (BACTROBAN) 2 % Apply 1 application topically 2 (two) times daily.   No facility-administered encounter medications on file as of 05/31/2020.     REVIEW OF SYSTEMS  : All other systems reviewed and negative except where noted in the History of Present Illness.   PHYSICAL EXAM: BP 130/76 (BP Location: Left Arm, Patient Position: Sitting, Cuff Size: Normal)   Pulse 76   Ht 5' 1.5" (1.562 m)   Wt 133 lb 4 oz (60.4 kg)   BMI 24.77 kg/m  General: Well developed white female in no acute distress Head: Normocephalic and atraumatic Eyes:  Sclerae anicteric, conjunctiva pink. Ears: Normal auditory acuity Lungs: Clear throughout to auscultation; no W/R/R. Heart: Regular rate and rhythm; no M/R/G. Abdomen: Soft, non-distended.  BS present.  Non-tender. Rectal:  Will be done at the time of colonoscopy. Musculoskeletal: Symmetrical with no gross deformities  Skin: No lesions on visible extremities Extremities: No edema  Neurological: Alert oriented x 4, grossly non-focal Psychological:  Alert and cooperative. Normal mood and affect  ASSESSMENT AND PLAN: *Personal history of colon polyps: Has undergone a few colonoscopies since 2020 with removal of large polyps.  Last colonoscopy in October 2021 showed a 25 mm polyp in the transverse colon that was removed by piecemeal.  Repeat was recommended for 6 to 43-month interval.  We will schedule that today with Dr. Rush Landmark.  The risks, benefits, and alternatives were discussed with the patient and they consent to proceed.  *Suspected diverticulitis: Was treated with a course of Cipro and Flagyl by her PCP.  At this point symptoms have resolved and she is feeling better.  She will  call us back in the interim if symptoms return at which time we can determine if she needs to be empirically treated versus CT scan, etc.   CC:  Vivi Barrack, MD

## 2020-06-09 ENCOUNTER — Other Ambulatory Visit: Payer: Self-pay

## 2020-06-09 ENCOUNTER — Telehealth: Payer: Self-pay

## 2020-06-09 MED ORDER — ALPRAZOLAM 1 MG PO TABS: 0.5000 mg | ORAL_TABLET | Freq: Every day | ORAL | 0 refills | Status: DC

## 2020-06-09 NOTE — Telephone Encounter (Signed)
..   LAST APPOINTMENT DATE: 05/10/2020   NEXT APPOINTMENT DATE:@Visit  date not found  MEDICATION: alprazolam   [facility] [address]  Let patient know to contact pharmacy at the end of the day to make sure medication is ready.  Please notify patient to allow 48-72 hours to process  Encourage patient to contact the pharmacy for refills or they can request refills through [facility]:   LAST REFILL:  QTY:  REFILL DATE:    OTHER COMMENTS:    Okay for refill?  Please advise

## 2020-06-09 NOTE — Chronic Care Management (AMB) (Signed)
Chronic Care Management Pharmacy Assistant   Name: Joan Mclaughlin  MRN: [id number] DOB: [birth year]   Reason for Encounter: Chart Prep/ IQ  Recent office visits:  05/24/20- Joan Asa, MD- seen for diverticulosis, no medication changes, follow up with PCP as needed 05/10/20- Joan Chyle, MD (PCP)- seen for diverticulosis, short course ciprofloxacin 500 mg x 7 DS, short course metronidazole 500 mg x 7 DS, follow up as needed 04/27/20- Joan Chyle, MD-  Seen for dizziness, no medication changes, no follow up documented 03/04/20- Joan Chyle, MD- seen for UTI, decreased amlodipine from 5 mg daily to 2.5 mg daily, no follow up documented 12/15/19- Joan Chyle, MD-( Video Visit)- seen for preventative health care, no medication changes, no follow up documented  Recent consult visits:  05/31/20- Joan Bogus, PA-C Joan Mclaughlin)- seen for follow up of Tubular adenoma of colon, colonoscopy scheduled, no medication changes, no follow up documented 04/07/20- Joan Monarch, MD (Dermatology)- seen for squamous cell carcinoma in situ, prescribed mupirocin ointment, follow up 6 months 02/26/20- Joan Newer, MD (Neurology)- seen for follow up of history of seizures, no medication changes, follow up 1 yr  Hospital visits:  None in previous 6 months  Medications: Outpatient Encounter Medications as of 06/09/2020  Medication Sig  . ALPRAZolam (XANAX) 1 MG tablet TAKE 1/2 TABLET BY MOUTH AT BEDTIME.  Marland Kitchen amLODipine (NORVASC) 5 MG tablet Take 0.5 tablets (2.5 mg total) by mouth daily.  . Ascorbic Acid (VITAMIN C) 500 MG CAPS Take 500 mg by mouth daily.   Marland Kitchen CALCIUM-MAGNESIUM PO Take 1 tablet by mouth daily.  . cetirizine (ZYRTEC) 10 MG tablet Take 10 mg by mouth daily as needed for allergies.   . Cholecalciferol (VITAMIN D3) 250 MCG (10000 UT) capsule Take 10,000 Units by mouth daily.   . hydrocortisone 2.5 % cream Apply topically 2 (two) times daily as needed (Rash). (Patient taking  differently: Apply 1 application topically 2 (two) times daily as needed (Rash).)  . hydrOXYzine (ATARAX/VISTARIL) 10 MG tablet Take 1-2 tablets (10-20 mg total) by mouth at bedtime.  Marland Kitchen ketoconazole (NIZORAL) 2 % shampoo Apply 1 application topically daily as needed for itching.  . metroNIDAZOLE (METROCREAM) 0.75 % cream Apply topically in the morning and at bedtime. (Patient taking differently: Apply 1 application topically at bedtime as needed (Rosacea).)  . Multiple Vitamin (MULTIVITAMIN WITH MINERALS) TABS tablet Take 1 tablet by mouth daily.  . Omega-3 Fatty Acids (SALMON OIL-1000 PO) Take 1,000 mg by mouth daily.  . Oxcarbazepine (TRILEPTAL) 300 MG tablet Take 1 tablet (300mg ) by mouth twice daily  . Potassium 99 MG TABS Take 99 mg by mouth daily.  . Red Yeast Rice 600 MG CAPS Take 1,200 mg by mouth daily.  . Turmeric 500 MG CAPS Take 500 mg by mouth daily.  . valACYclovir (VALTREX) 500 MG tablet TAKE 1 TABLET (500 MG TOTAL) BY MOUTH DAILY AS NEEDED (COLD SORES).  . Zinc 30 MG CAPS Take 30 mg by mouth daily.   No facility-administered encounter medications on file as of 06/09/2020.   I spoke with Joan Mclaughlin this afternoon and she has concerns about some of the pill burden she has been experiencing. In addition to validating all of her medications she would also like a second opinion about adding a ginger supplement to her regimen. On the other hand, Joan Mclaughlin stated that she has been prescribed medications that help make it easier for her to go to sleep, but she still has some problems. She informed  me that she doesn't start getting tired until almost midnight and by the time she falls asleep it doesn't take her long to wake back up. Because of only getting 3-4 hrs of sleep at a time, she is mostly reluctant to wake up early. This is a concern that Joan Mclaughlin would also like advice on.   Current Documented Medications Alprazolam 1 mg- 60 DS last filled 04/13/20 Amlodipine 5 mg- 90 DS last filled  04/16/20- Reported taking 1 tab daily  Vitamin C 500 mg- Calcium- Magnesium Cetrizine 10 mg- Vitamin D3 10000 Units Hydrocortisone 2.5%- At bedtime  Hydroxyzine 10 mg- 30 DS last filled 05/10/20 Ketoconazole 2% shampoo- PRN  Metronidazole 0.75%  Multivitamin Omega 3 Oxcarbazepine 300 mg- 90 DS last filled 05/13/20 Potassium 99 mg Red Yeast Rice 600 mg Tumeric 500 mg Valacyclovir 500 mg- 30 DS last filled 02/23/20- PRN  Zinc 30 mg-   Patient Reported Taking: "Multiple of each at the same time" Glutathione 250 mg- Every morning on a an empty stomach: Taking for 2 weeks  Digest Basic Vitamin A - [place] 500 mg Vitamin B- Complex  Vitamin E 400 IU  Selenium 200 mcg  Probiotic   Have you seen any other providers since your last visit?  Patient stated she has not seen any other providers   Any changes in your medications or health?  Patient stated she has increased the dosage of amlodipine because she, at the time, felt like she needed it. She is still currently taking 1 tab instead of 0.5 tabs, but she is unsure if this is necessary. She has also added a variety of otc supplements.   Any side effects from any medications?  Patient stated she does not have any side effects from any medications  Do you have an symptoms or problems not managed by your medications?  Patient stated she has no unmanaged symptoms  Any concerns about your health right now?  Patient stated she is concerned about the amount of medications she's taking  Has your provider asked that you check blood pressure, blood sugar, or follow special diet at home?  Patient stated she does not own a BP monitor and is not interested in having one because she "isn't good at using those things". Patient stated she doesn't have a specific diet, but she likes salt and spice in her food.  Do you get any type of exercise on a regular basis?  Patient stated she is very active. She goes to the gym twice a week for classes and  will start silver sneakers three times a week  Can you think of a goal you would like to reach for your health?  Patient stated she would like to not be on so many medications if not needed.  Do you have any problems getting your medications?  Patient did not express any problems with getting medications  Is there anything that you would like to discuss during the appointment?  Patient stated she would like to discuss recommendations for sleep hygiene, taking ginger, medication review for necessity.   Please bring medications and supplements to appointment Reminded patient of initial office visit with CPP 06/07 at 2 pm   [facility] [place], [place]

## 2020-06-09 NOTE — Addendum Note (Signed)
Addended by: Marian Sorrow on: 06/09/2020 03:00 PM   Modules accepted: Orders

## 2020-06-09 NOTE — Telephone Encounter (Signed)
Pt requesting refill on Xanax 1 mg tablet. Last OV 05/10/2020.

## 2020-06-14 ENCOUNTER — Other Ambulatory Visit: Payer: Self-pay

## 2020-06-14 ENCOUNTER — Ambulatory Visit (INDEPENDENT_AMBULATORY_CARE_PROVIDER_SITE_OTHER): Payer: Medicare Other

## 2020-06-14 DIAGNOSIS — E785 Hyperlipidemia, unspecified: Secondary | ICD-10-CM

## 2020-06-14 DIAGNOSIS — G4700 Insomnia, unspecified: Secondary | ICD-10-CM

## 2020-06-14 DIAGNOSIS — I10 Essential (primary) hypertension: Secondary | ICD-10-CM

## 2020-06-14 NOTE — Patient Instructions (Addendum)
Ms. Haith,  Thank you for talking with me today. I have included our care plan/goals in the following pages.   Please review and call me at [phone] with any questions.  Thanks! Ellin Mayhew, Pharm.D., BCGP Clinical Pharmacist [facility] [place] [phone] There are no care plans to display for this patient.    The patient was given the following information about Chronic Care Management services today, agreed to services, and gave verbal consent: 1. CCM service includes personalized support from designated clinical staff supervised by the primary care provider, including individualized plan of care and coordination with other care providers 2. 24/7 contact phone numbers for assistance for urgent and routine care needs. 3. Service will only be billed when office clinical staff spend 20 minutes or more in a month to coordinate care. 4. Only one practitioner may furnish and bill the service in a calendar month. 5.The patient may stop CCM services at any time (effective at the end of the month) by phone call to the office staff. 6. The patient will be responsible for cost sharing (co-pay) of up to 20% of the service fee (after annual deductible is met). Patient agreed to services and consent obtained.  The patient verbalized understanding of instructions provided today and agreed to receive a mailed copy of patient instruction and/or educational materials. Telephone follow up appointment with pharmacy team member scheduled for: See next appointment with "Care Management Staff" under "What's Next" below.  Hypertension, Adult High blood pressure (hypertension) is when the force of blood pumping through the arteries is too strong. The arteries are the blood vessels that carry blood from the heart throughout the body. Hypertension forces the heart to work harder to pump blood and may cause arteries to become narrow or stiff. Untreated or  uncontrolled hypertension can cause a heart attack, heart failure, a stroke, kidney disease, and other problems. A blood pressure reading consists of a higher number over a lower number. Ideally, your blood pressure should be below 120/80. The first ("top") number is called the systolic pressure. It is a measure of the pressure in your arteries as your heart beats. The second ("bottom") number is called the diastolic pressure. It is a measure of the pressure in your arteries as the heart relaxes. What are the causes? The exact cause of this condition is not known. There are some conditions that result in or are related to high blood pressure. What increases the risk? Some risk factors for high blood pressure are under your control. The following factors may make you more likely to develop this condition:  Smoking.  Having type 2 diabetes mellitus, high cholesterol, or both.  Not getting enough exercise or physical activity.  Being overweight.  Having too much fat, sugar, calories, or salt (sodium) in your diet.  Drinking too much alcohol. Some risk factors for high blood pressure may be difficult or impossible to change. Some of these factors include:  Having chronic kidney disease.  Having a family history of high blood pressure.  Age. Risk increases with age.  Race. You may be at higher risk if you are African American.  Gender. Men are at higher risk than women before age 64. After age 29, women are at higher risk than men.  Having obstructive sleep apnea.  Stress. What are the signs or symptoms? High blood pressure may not cause symptoms. Very high blood pressure (hypertensive crisis) may cause:  Headache.  Anxiety.  Shortness of breath.  Nosebleed.  Nausea and vomiting.  Vision changes.  Severe chest pain.  Seizures. How is this diagnosed? This condition is diagnosed by measuring your blood pressure while you are seated, with your arm resting on a flat  surface, your legs uncrossed, and your feet flat on the floor. The cuff of the blood pressure monitor will be placed directly against the skin of your upper arm at the level of your heart. It should be measured at least twice using the same arm. Certain conditions can cause a difference in blood pressure between your right and left arms. Certain factors can cause blood pressure readings to be lower or higher than normal for a short period of time:  When your blood pressure is higher when you are in a health care provider's office than when you are at home, this is called white coat hypertension. Most people with this condition do not need medicines.  When your blood pressure is higher at home than when you are in a health care provider's office, this is called masked hypertension. Most people with this condition may need medicines to control blood pressure. If you have a high blood pressure reading during one visit or you have normal blood pressure with other risk factors, you may be asked to:  Return on a different day to have your blood pressure checked again.  Monitor your blood pressure at home for 1 week or longer. If you are diagnosed with hypertension, you may have other blood or imaging tests to help your health care provider understand your overall risk for other conditions. How is this treated? This condition is treated by making healthy lifestyle changes, such as eating healthy foods, exercising more, and reducing your alcohol intake. Your health care provider may prescribe medicine if lifestyle changes are not enough to get your blood pressure under control, and if:  Your systolic blood pressure is above 130.  Your diastolic blood pressure is above 80. Your personal target blood pressure may vary depending on your medical conditions, your age, and other factors. Follow these instructions at home: Eating and drinking  Eat a diet that is high in fiber and potassium, and low in sodium,  added sugar, and fat. An example eating plan is called the DASH (Dietary Approaches to Stop Hypertension) diet. To eat this way: ? Eat plenty of fresh fruits and vegetables. Try to fill one half of your plate at each meal with fruits and vegetables. ? Eat whole grains, such as whole-wheat pasta, brown rice, or whole-grain bread. Fill about one fourth of your plate with whole grains. ? Eat or drink low-fat dairy products, such as skim milk or low-fat yogurt. ? Avoid fatty cuts of meat, processed or cured meats, and poultry with skin. Fill about one fourth of your plate with lean proteins, such as fish, chicken without skin, beans, eggs, or tofu. ? Avoid pre-made and processed foods. These tend to be higher in sodium, added sugar, and fat.  Reduce your daily sodium intake. Most people with hypertension should eat less than 1,500 mg of sodium a day.  Do not drink alcohol if: ? Your health care provider tells you not to drink. ? You are pregnant, may be pregnant, or are planning to become pregnant.  If you drink alcohol: ? Limit how much you use to:  0-1 drink a day for women.  0-2 drinks a day for men. ? Be aware of how much alcohol is in your drink. In the  [place] one drink equals one 12 oz bottle of beer (355 mL), one 5 oz glass of wine (148 mL), or one 1 oz glass of hard liquor (44 mL).   Lifestyle  Work with your health care provider to maintain a healthy body weight or to lose weight. Ask what an ideal weight is for you.  Get at least 30 minutes of exercise most days of the week. Activities may include walking, swimming, or biking.  Include exercise to strengthen your muscles (resistance exercise), such as Pilates or lifting weights, as part of your weekly exercise routine. Try to do these types of exercises for 30 minutes at least 3 days a week.  Do not use any products that contain nicotine or tobacco, such as cigarettes, e-cigarettes, and chewing tobacco. If you need help quitting,  ask your health care provider.  Monitor your blood pressure at home as told by your health care provider.  Keep all follow-up visits as told by your health care provider. This is important.   Medicines  Take over-the-counter and prescription medicines only as told by your health care provider. Follow directions carefully. Blood pressure medicines must be taken as prescribed.  Do not skip doses of blood pressure medicine. Doing this puts you at risk for problems and can make the medicine less effective.  Ask your health care provider about side effects or reactions to medicines that you should watch for. Contact a health care provider if you:  Think you are having a reaction to a medicine you are taking.  Have headaches that keep coming back (recurring).  Feel dizzy.  Have swelling in your ankles.  Have trouble with your vision. Get help right away if you:  Develop a severe headache or confusion.  Have unusual weakness or numbness.  Feel faint.  Have severe pain in your chest or abdomen.  Vomit repeatedly.  Have trouble breathing. Summary  Hypertension is when the force of blood pumping through your arteries is too strong. If this condition is not controlled, it may put you at risk for serious complications.  Your personal target blood pressure may vary depending on your medical conditions, your age, and other factors. For most people, a normal blood pressure is less than 120/80.  Hypertension is treated with lifestyle changes, medicines, or a combination of both. Lifestyle changes include losing weight, eating a healthy, low-sodium diet, exercising more, and limiting alcohol. This information is not intended to replace advice given to you by your health care provider. Make sure you discuss any questions you have with your health care provider. Document Revised: 09/04/2017 Document Reviewed: 09/04/2017 Elsevier Patient Education  2021 [employer].

## 2020-06-14 NOTE — Progress Notes (Signed)
Chronic Care Management Pharmacy Note  06/14/2020 Name:  Joan Mclaughlin MRN:  [id number] DOB:  1940/09/03  Recommendations/Changes made from today's visit: No changes  Subjective: Joan Mclaughlin is an 80 y.o. year old female who is a primary patient of Joan Mclaughlin, Joan Greenhouse, MD.  The CCM team was consulted for assistance with disease management and care coordination needs.    Engaged with patient face to face for initial visit in response to provider referral for pharmacy case management and/or care coordination services.   Consent to Services:  The patient was given the following information about Chronic Care Management services today, agreed to services, and gave verbal consent: 1. CCM service includes personalized support from designated clinical staff supervised by the primary care provider, including individualized plan of care and coordination with other care providers 2. 24/7 contact phone numbers for assistance for urgent and routine care needs. 3. Service will only be billed when office clinical staff spend 20 minutes or more in a month to coordinate care. 4. Only one practitioner may furnish and bill the service in a calendar month. 5.The patient may stop CCM services at any time (effective at the end of the month) by phone call to the office staff. 6. The patient will be responsible for cost sharing (co-pay) of up to 20% of the service fee (after annual deductible is met). Patient agreed to services and consent obtained.  Patient Care Team: Joan Barrack, MD as PCP - General (Family Medicine) Joan Halsted, MD as Referring Physician (Ophthalmology) Joan Spates, MD (Inactive) as Consulting Physician (Gastroenterology) Joan Danes, PA-C as Physician Assistant (Dermatology) Joan Sprang, MD as Consulting Physician (Neurology) Joan Mclaughlin, DPM as Consulting Physician (Podiatry) [place], [place], PA-C (Inactive) (Dermatology) Joan Mclaughlin, [facility]  as Pharmacist (Pharmacist)  Recent office visits:  05/24/20- Joan Asa, MD- seen for diverticulosis, no medication changes, follow up with PCP as needed 05/10/20- Joan Chyle, MD (PCP)- seen for diverticulosis, short course ciprofloxacin 500 mg x 7 DS, short course metronidazole 500 mg x 7 DS, follow up as needed 04/27/20- Joan Chyle, MD-  Seen for dizziness, no medication changes, no follow up documented 03/04/20- Joan Chyle, MD- seen for UTI, decreased amlodipine from 5 mg daily to 2.5 mg daily, no follow up documented 12/15/19- Joan Chyle, MD-( Video Visit)- seen for preventative health care, no medication changes, no follow up documented  Recent consult visits:  05/31/20- Joan Bogus, PA-C Joan Mclaughlin)- seen for follow up of Tubular adenoma of colon, colonoscopy scheduled, no medication changes, no follow up documented 04/07/20- Joan Monarch, MD (Dermatology)- seen for squamous cell carcinoma in situ, prescribed mupirocin ointment, follow up 6 months 02/26/20- Joan Newer, MD (Neurology)- seen for follow up of history of seizures, no medication changes, follow up 1 yr  Hospital visits:  None in previous 6 months Objective:  Lab Results  Component Value Date   CREATININE 0.58 05/24/2020   CREATININE 0.52 02/19/2019   CREATININE 0.60 09/25/2018    No results found for: HGBA1C Last diabetic Eye exam: No results found for: HMDIABEYEEXA  Last diabetic Foot exam: No results found for: HMDIABFOOTEX      Component Value Date/Time   CHOL 202 (H) 03/05/2018 1011   TRIG 80.0 03/05/2018 1011   HDL 52.00 03/05/2018 1011   CHOLHDL 4 03/05/2018 1011   VLDL 16.0 03/05/2018 1011   LDLCALC 134 (H) 03/05/2018 1011    Hepatic Function Latest Ref Rng & Units 05/24/2020 07/29/2018 03/05/2018  Total Protein  6.0 - 8.3 g/dL 7.2 7.1 7.4  Albumin 3.5 - 5.2 g/dL 4.4 4.4 4.4  AST 0 - 37 U/L 23 19 19   ALT 0 - 35 U/L 23 18 16   Alk Phosphatase 39 - 117 U/L 62 74 75  Total Bilirubin 0.2 -  1.2 mg/dL 0.5 0.5 0.5  Bilirubin, Direct 0.0 - 0.3 mg/dL 0.1 - -    Lab Results  Component Value Date/Time   TSH 1.78 07/29/2018 11:18 AM   TSH 3.41 03/05/2018 10:11 AM    CBC Latest Ref Rng & Units 05/24/2020 02/19/2019 09/16/2018  WBC 4.0 - 10.5 K/uL 6.0 7.4 6.9  Hemoglobin 12.0 - 15.0 g/dL 14.4 13.3 14.7  Hematocrit 36.0 - 46.0 % 41.5 40.0 42.7  Platelets 150.0 - 400.0 K/uL 266.0 299 239.0    No results found for: VD25OH  Clinical ASCVD:  The ASCVD Risk score Joan Bussing DC Jr., et al., 2013) failed to calculate for the following reasons:   The 2013 ASCVD risk score is only valid for ages 61 to 69    Other: (CHADS2VASc if Afib, PHQ9 if depression, MMRC or CAT for COPD, ACT, DEXA)  Social History   Tobacco Use  Smoking Status Never Smoker  Smokeless Tobacco Never Used   BP Readings from Last 3 Encounters:  05/31/20 130/76  05/24/20 128/82  05/10/20 131/75   Pulse Readings from Last 3 Encounters:  05/31/20 76  05/24/20 71  05/10/20 69   Wt Readings from Last 3 Encounters:  05/31/20 133 lb 4 oz (60.4 kg)  05/24/20 133 lb (60.3 kg)  05/10/20 135 lb 12.8 oz (61.6 kg)    Assessment: Review of patient past medical history, allergies, medications, health status, including review of consultants reports, laboratory and other test data, was performed as part of comprehensive evaluation and provision of chronic care management services.   SDOH:  (Social Determinants of Health) assessments and interventions performed:    CCM Care Plan  Allergies  Allergen Reactions  . Amoxicillin-Pot Clavulanate Nausea And Vomiting    Patient denies knowledge of this allergy  Did it involve swelling of the face/tongue/throat, SOB, or low BP? No Did it involve sudden or severe rash/hives, skin peeling, or any reaction on the inside of your mouth or nose? No Did you need to seek medical attention at a hospital or doctor's office? No When did it last happen?in 2016 If all above answers  are "NO", may proceed with cephalosporin use.   Marland Kitchen Suvorexant Other (See Comments)    Nightmares     Medications Reviewed Today    Reviewed by Joan Mclaughlin, [facility] (Pharmacist) on 06/14/20 at [place] List Status: <None>  Medication Order Taking? Sig Documenting Provider Last Dose Status Informant  ALPRAZolam (XANAX) 1 MG tablet [id number]  Take 0.5 tablets (0.5 mg total) by mouth at bedtime. Joan Barrack, MD  Active   amLODipine (NORVASC) 5 MG tablet [id number] No Take 0.5 tablets (2.5 mg total) by mouth daily. Joan Barrack, MD Taking Active   Ascorbic Acid (VITAMIN C) 500 MG CAPS [id number] No Take 500 mg by mouth daily.  [provider] Taking Active Self  CALCIUM-MAGNESIUM PO [id number] No Take 1 tablet by mouth daily. [provider] Taking Active Self  cetirizine (ZYRTEC) 10 MG tablet [id number] No Take 10 mg by mouth daily as needed for allergies.  [provider] Taking Active Self  Cholecalciferol (VITAMIN D3) 250 MCG (10000 UT) capsule [id number] No Take 10,000 Units by mouth daily.  [provider] Taking Active Self  hydrocortisone 2.5 % cream [id number] No Apply topically 2 (two) times daily as needed (Rash).  Patient taking differently: Apply 1 application topically 2 (two) times daily as needed (Rash).   Clark-Burning, Anderson [place], PA-C Taking Active   hydrOXYzine (ATARAX/VISTARIL) 10 MG tablet [id number] No Take 1-2 tablets (10-20 mg total) by mouth at bedtime. Joan Monarch, MD Taking Active   ketoconazole (NIZORAL) 2 % shampoo [id number] No Apply 1 application topically daily as needed for itching. [provider] Taking Active Self  metroNIDAZOLE (METROCREAM) 0.75 % cream [id number] No Apply topically in the morning and at bedtime.  Patient taking differently: Apply 1 application topically at bedtime as needed (Rosacea).   Clark-Burning, [place], PA-C Taking Active   Multiple Vitamin (MULTIVITAMIN WITH MINERALS) TABS tablet [id number] No  Take 1 tablet by mouth daily. [provider] Taking Active Self  Omega-3 Fatty Acids (SALMON OIL-1000 PO) [id number] No Take 1,000 mg by mouth daily. [provider] Taking Active Self  Oxcarbazepine (TRILEPTAL) 300 MG tablet [id number] No Take 1 tablet (334m) by mouth twice daily ACameron Sprang MD Taking Active   Potassium 99 MG TABS [id number] Take 99 mg by mouth daily. [provider] Taking Active Self  Red Yeast Rice 600 MG CAPS [id number] Take 1,200 mg by mouth daily. [provider] Taking Active Self  Turmeric 500 MG CAPS [id number] Take 500 mg by mouth daily. [provider] Taking Active Self  valACYclovir (VALTREX) 500 MG tablet [id number] TAKE 1 TABLET (500 MG TOTAL) BY MOUTH DAILY AS NEEDED (COLD SORES). PVivi Barrack MD Taking Active   Zinc 30 MG CAPS [id number] Take 30 mg by mouth daily. [provider] Taking Active Self          Patient Active Problem List   Diagnosis Date Noted  . Diverticulosis 05/10/2020  . Ear itch 11/28/2018  . 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 Essential hypertension 10/14/2018  . Abnormal colonoscopy 09/18/2018  . History of colonic polyps 09/16/2018  . Tubular adenoma of colon 06/27/2018  . Dyslipidemia 03/06/2018  . Allergic rhinitis 03/05/2018  . History of cold sores 03/05/2018  . 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 Constipation 01/14/2017  . Localization-related idiopathic epilepsy and epileptic syndromes with seizures of localized onset, not intractable, without status epilepticus ([place] 03/02/2015  . Insomnia 05/02/2013    Immunization History  Administered Date(s) Administered  . Influenza-Unspecified 10/22/2017  . Moderna Sars-Covid-2 Vaccination 12/16/2019, 01/13/2020  . Pneumococcal Conjugate-13 05/16/2016  . Pneumococcal Polysaccharide-23 06/25/2013  . Tdap 10/22/2017  . Zoster Recombinat (Shingrix) 03/24/2017, 07/23/2017    Conditions to be addressed/monitored: HLD HTN Insomnia  Care Plan : CCM Pharmacy Care Plan  Updates made by PMadelin Mclaughlin [facility] 06/14/2020 12:00 AM    Problem: HLD HTN Insomnia   Priority: High    Long-Range Goal: Disease Management   Start Date: 06/14/2020  Expected End Date: 06/14/2021  This Visit's Progress: On track  Priority: High  Note:   There are no care plans that you recently modified to display for this patient.  Current Barriers:  . Unable to achieve control of insomnia   Pharmacist Clinical Goal(s):  .Marland KitchenPatient will contact provider office for questions/concerns as evidenced notation of same in electronic health record through collaboration with PharmD and provider.   Interventions: . 1:1 collaboration with PVivi Barrack MD regarding development and update of comprehensive plan of care as evidenced by  provider attestation and co-signature . Inter-disciplinary care team collaboration (see longitudinal plan of care) . Comprehensive medication review performed; medication list updated in electronic medical record . No Rx changes   Hypertension (BP goal <140/90) -Controlled -Current treatment: . Amlodipine 2.5 mg once daily  -Current home readings: does not have cuff -Current dietary habits: oatmeal in AM, watches sugar. Does add some salt. -Current exercise habits: works out with Physiological scientist twice weekly. Plans to go to the local gym 3x/week through [employer] fitness benefit -Denies hypotensive/hypertensive symptoms -Educated on Daily salt intake goal < 2300 mg; -Counseled to monitor BP at home as directed, document, and provide log at future appointments -Recommended to continue current medication  Hyperlipidemia: (LDL goal < 130) -Not ideally controlled based on previous 02/2018 panel  -Is using RYR 1200 mg daily; omega 3 fatty acids 1000 mg daily  -Current treatment: . No medications -Medications previously tried: none.  -Educated on  Cholesterol goals;  -Recommended to continue current medication Will be due for lipids  Insomnia (Goal: minimize symptoms, optimize sleep hygiene) -Uncontrolled  -Some problem falling asleep and staying asleep, eats dinner at 7 pm. No alcohol. No caffeine past 2 pm.  -Current treatment  . Alprazolam 0.5 mg once every night at bed time . Hydroxyzine 1-2 tablets by mouth at bedtime -Medications previously tried: Belsomra (insomnia)  -Counseled on sleep hygiene. -Reviewed sleep habits in detail - avoid eating late, set room to comfortable temperature. Handout provided.  Patient Goals/Self-Care Activities . Patient will:  - take medications as prescribed target a minimum of 150 minutes of moderate intensity exercise weekly engage in dietary modifications by reducing salt intake to less than 2.3 grams/day  Follow Up Plan: [place] f/u call 6 months. CPA call next month to check in on sleep Medication Assistance: None required.  Patient affirms current coverage meets needs.  Patient's preferred pharmacy is:  [id number]- [place], [place] - [address]Phone: [phone]: [phone] Follow Up:  Patient agrees to Care Plan and Follow-up.    Future Appointments  Date Time Provider [place] 08/16/2020 11:00 AM SWarren Mclaughlin [facility]  01/10/2021  3:30 PM [facility] CCM PHARMACIST [facility] P[facility]  02/27/2021  4:00 PM ACameron Sprang MD [facility] None   JMadelin Mclaughlin PharmD, CPP Clinical Pharmacist Practitioner  [facility]  [phone]

## 2020-06-20 ENCOUNTER — Telehealth: Payer: Self-pay

## 2020-06-20 DIAGNOSIS — Z8619 Personal history of other infectious and parasitic diseases: Secondary | ICD-10-CM

## 2020-06-20 NOTE — Telephone Encounter (Signed)
Spoke with patient ,advised to schedule a appointment with PCP to address concerns. She voices understanding transferred to front desk

## 2020-06-20 NOTE — Addendum Note (Signed)
Addended by: Vivi Barrack on: 06/20/2020 10:46 AM   Modules accepted: Orders

## 2020-06-20 NOTE — Telephone Encounter (Signed)
Patient notified to schedule appt with lab

## 2020-06-20 NOTE — Telephone Encounter (Signed)
error 

## 2020-06-20 NOTE — Telephone Encounter (Signed)
Patient is calling in wanting to know if she can get tested for Lyme disease. States that she has had it in the past and is traveling out of the country tomorrow.

## 2020-06-20 NOTE — Telephone Encounter (Signed)
Spoke with patient she has been working in her garden and walking her dog in the woods. 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 Please advise

## 2020-06-20 NOTE — Telephone Encounter (Signed)
Patient called today and requested advice from MD about adverse reaction to Covid vaccine-  she received first vaccine in 12/21 and second one 1/22 and  is having arthritis symptoms in hips knees and down into her calf (pain in joints swelling slightly) which just started after she was gardening yesterday-  patient has had lime disease so she was wondering if the symptoms could be caused by lime disease or the vaccine-please advise

## 2020-06-21 ENCOUNTER — Other Ambulatory Visit: Payer: Self-pay

## 2020-06-21 ENCOUNTER — Other Ambulatory Visit (INDEPENDENT_AMBULATORY_CARE_PROVIDER_SITE_OTHER): Payer: Medicare Other

## 2020-06-21 DIAGNOSIS — Z8619 Personal history of other infectious and parasitic diseases: Secondary | ICD-10-CM

## 2020-06-22 LAB — B. BURGDORFI ANTIBODIES: B burgdorferi Ab IgG+IgM: <0.9 index

## 2020-06-23 NOTE — Progress Notes (Signed)
Please inform patient of the following:  Lyme disease test is negative.  Joan Mclaughlin. [name], MD 06/23/2020 9:15 AM

## 2020-06-24 NOTE — Progress Notes (Signed)
Left message to return call to our office at their convenience.  

## 2020-06-29 ENCOUNTER — Telehealth: Payer: Self-pay | Admitting: Gastroenterology

## 2020-06-29 NOTE — Telephone Encounter (Signed)
P,s call pt, she needs to r/s her procedure that is scheduled on 7/25 at [place].

## 2020-06-30 ENCOUNTER — Encounter: Payer: Self-pay | Admitting: Family Medicine

## 2020-06-30 ENCOUNTER — Other Ambulatory Visit: Payer: Self-pay

## 2020-06-30 ENCOUNTER — Ambulatory Visit (INDEPENDENT_AMBULATORY_CARE_PROVIDER_SITE_OTHER): Payer: Medicare Other | Admitting: Family Medicine

## 2020-06-30 VITALS — BP 136/83 | HR 90 | Temp 98.0°F | Ht 65.1 in | Wt 132.6 lb

## 2020-06-30 DIAGNOSIS — M25562 Pain in left knee: Secondary | ICD-10-CM | POA: Diagnosis not present

## 2020-06-30 DIAGNOSIS — I10 Essential (primary) hypertension: Secondary | ICD-10-CM

## 2020-06-30 DIAGNOSIS — G4700 Insomnia, unspecified: Secondary | ICD-10-CM | POA: Diagnosis not present

## 2020-06-30 DIAGNOSIS — M1990 Unspecified osteoarthritis, unspecified site: Secondary | ICD-10-CM | POA: Diagnosis not present

## 2020-06-30 NOTE — Assessment & Plan Note (Signed)
Doing well with Xanax 0.5 mg daily.  Refill was sent in recently.

## 2020-06-30 NOTE — Assessment & Plan Note (Signed)
Likely had a mild flareup of osteoarthritis causing pain in her left knee.  Exam today is otherwise reassuring.  Given that her symptoms are improving we will continue with conservative measures including compression and ice.  She can use over-the-counter meds as needed.  Discussed reasons to return to care.

## 2020-06-30 NOTE — Patient Instructions (Signed)
It was very nice to see you today!  You had a mild arthritis flare in your knee.  Please continue the compression and ice.  You can take over-the-counter meds as needed.  Let me know if it is not improving and we can talk about alternatives.  Take care, Dr [name]  PLEASE NOTE:  If you had any lab tests please let us know if you have not heard back within a few days. You may see your results on mychart before we have a chance to review them but we will give you a call once they are reviewed by [place]. If we ordered any referrals today, please let us know if you have not heard from their office within the next week.   Please try these tips to maintain a healthy lifestyle:  Eat at least 3 REAL meals and 1-2 snacks per day.  Aim for no more than 5 hours between eating.  If you eat breakfast, please do so within one hour of getting up.   Each meal should contain half fruits/vegetables, one quarter protein, and one quarter carbs (no bigger than a computer mouse)  Cut down on sweet beverages. This includes juice, soda, and sweet tea.   Drink at least 1 glass of water with each meal and aim for at least 8 glasses per day  Exercise at least 150 minutes every week.

## 2020-06-30 NOTE — Progress Notes (Signed)
   Joan Mclaughlin is a 80 y.o. female who presents today for an office visit.  Assessment/Plan:  ronic Problems Addressed Today: Osteoarthritis Likely had a mild flareup of osteoarthritis causing pain in her left knee.  Exam today is otherwise reassuring.  Given that her symptoms are improving we will continue with conservative measures including compression and ice.  She can use over-the-counter meds as needed.  Discussed reasons to return to care.  Insomnia Doing well with Xanax 0.5 mg daily.  Refill was sent in recently.  Essential hypertension At goal on amlodipine 2.5 mg daily.    Subjective:  HPI:  Patient here with left knee pain.  Started couple weeks ago.  Was doing a lot of moving around the house.  She tried resting, elevating, and putting ice to your knee.  This seemed to help.  She has been taking Motrin which helps as well.       Objective:  Physical Exam: BP 136/83   Pulse 90   Temp 98 F (36.7 C) (Temporal)   Ht 5' 5.1" (1.654 m)   Wt 132 lb 9.6 oz (60.1 kg)   SpO2 99%   BMI 22.00 kg/m   Gen: No acute distress, resting comfortably MSK: Left knee: No deformities.  Crepitus with active and passive range of motion.  Stable to varus and valgus stress.  Posterior and anterior drawer signs negative.  Neurovascular intact distally. Neuro: Grossly normal, moves all extremities Psych: Normal affect and thought content      Antoniette Peake M. [name], MD 06/30/2020 11:01 AM

## 2020-06-30 NOTE — Assessment & Plan Note (Signed)
At goal on amlodipine 2.5 mg daily.

## 2020-07-01 ENCOUNTER — Ambulatory Visit (INDEPENDENT_AMBULATORY_CARE_PROVIDER_SITE_OTHER): Payer: Medicare Other

## 2020-07-01 DIAGNOSIS — Z0000 Encounter for general adult medical examination without abnormal findings: Secondary | ICD-10-CM | POA: Diagnosis not present

## 2020-07-01 NOTE — Patient Instructions (Signed)
Joan Mclaughlin , Thank you for taking time to come for your Medicare Wellness Visit. I appreciate your ongoing commitment to your health goals. Please review the following plan we discussed and let me know if I can assist you in the future.   Screening recommendations/referrals: Colonoscopy: Pt stated she will schedule for 09/2020 last Done 11/02/19 Mammogram: Done 03/04/18 repeat every year Recommended yearly ophthalmology/optometry visit for glaucoma screening and checkup Recommended yearly dental visit for hygiene and checkup  Vaccinations: Influenza vaccine: Due 08/08/20 Pneumococcal vaccine: Completed  Tdap vaccine: Done 10/22/17 due 10/23/27 Shingles vaccine: Completed 03/24/17 & 07/23/17   Covid-19:12/16/19 & 01/13/20  Advanced directives: Copies in chart  Conditions/risks identified: Get back to silver sneakers   Next appointment: Follow up in one year for your annual wellness visit    Preventive Care 23 Years and Older, Female Preventive care refers to lifestyle choices and visits with your health care provider that can promote health and wellness. What does preventive care include? A yearly physical exam. This is also called an annual well check. Dental exams once or twice a year. Routine eye exams. Ask your health care provider how often you should have your eyes checked. Personal lifestyle choices, including: Daily care of your teeth and gums. Regular physical activity. Eating a healthy diet. Avoiding tobacco and drug use. Limiting alcohol use. Practicing safe sex. Taking low-dose aspirin every day. Taking vitamin and mineral supplements as recommended by your health care provider. What happens during an annual well check? The services and screenings done by your health care provider during your annual well check will depend on your age, overall health, lifestyle risk factors, and family history of disease. Counseling  Your health care provider may ask you questions about  your: Alcohol use. Tobacco use. Drug use. Emotional well-being. Home and relationship well-being. Sexual activity. Eating habits. History of falls. Memory and ability to understand (cognition). Work and work Statistician. Reproductive health. Screening  You may have the following tests or measurements: Height, weight, and BMI. Blood pressure. Lipid and cholesterol levels. These may be checked every 5 years, or more frequently if you are over 86 years old. Skin check. Lung cancer screening. You may have this screening every year starting at age 76 if you have a 30-pack-year history of smoking and currently smoke or have quit within the past 15 years. Fecal occult blood test (FOBT) of the stool. You may have this test every year starting at age 65. Flexible sigmoidoscopy or colonoscopy. You may have a sigmoidoscopy every 5 years or a colonoscopy every 10 years starting at age 74. Hepatitis C blood test. Hepatitis B blood test. Sexually transmitted disease (STD) testing. Diabetes screening. This is done by checking your blood sugar (glucose) after you have not eaten for a while (fasting). You may have this done every 1-3 years. Bone density scan. This is done to screen for osteoporosis. You may have this done starting at age 74. Mammogram. This may be done every 1-2 years. Talk to your health care provider about how often you should have regular mammograms. Talk with your health care provider about your test results, treatment options, and if necessary, the need for more tests. Vaccines  Your health care provider may recommend certain vaccines, such as: Influenza vaccine. This is recommended every year. Tetanus, diphtheria, and acellular pertussis (Tdap, Td) vaccine. You may need a Td booster every 10 years. Zoster vaccine. You may need this after age 46. Pneumococcal 13-valent conjugate (PCV13) vaccine. One dose is  recommended after age 58. Pneumococcal polysaccharide (PPSV23) vaccine.  One dose is recommended after age 16. Talk to your health care provider about which screenings and vaccines you need and how often you need them. This information is not intended to replace advice given to you by your health care provider. Make sure you discuss any questions you have with your health care provider. Document Released: 01/21/2015 Document Revised: 09/14/2015 Document Reviewed: 10/26/2014 Elsevier Interactive Patient Education  2017 [place] Prevention in the Home Falls can cause injuries. They can happen to people of all ages. There are many things you can do to make your home safe and to help prevent falls. What can I do on the outside of my home? Regularly fix the edges of walkways and driveways and fix any cracks. Remove anything that might make you trip as you walk through a door, such as a raised step or threshold. Trim any bushes or trees on the path to your home. Use bright outdoor lighting. Clear any walking paths of anything that might make someone trip, such as rocks or tools. Regularly check to see if handrails are loose or broken. Make sure that both sides of any steps have handrails. Any raised decks and porches should have guardrails on the edges. Have any leaves, snow, or ice cleared regularly. Use sand or salt on walking paths during winter. Clean up any spills in your garage right away. This includes oil or grease spills. What can I do in the bathroom? Use night lights. Install grab bars by the toilet and in the tub and shower. Do not use towel bars as grab bars. Use non-skid mats or decals in the tub or shower. If you need to sit down in the shower, use a plastic, non-slip stool. Keep the floor dry. Clean up any water that spills on the floor as soon as it happens. Remove soap buildup in the tub or shower regularly. Attach bath mats securely with double-sided non-slip rug tape. Do not have throw rugs and other things on the floor that can make  you trip. What can I do in the bedroom? Use night lights. Make sure that you have a light by your bed that is easy to reach. Do not use any sheets or blankets that are too big for your bed. They should not hang down onto the floor. Have a firm chair that has side arms. You can use this for support while you get dressed. Do not have throw rugs and other things on the floor that can make you trip. What can I do in the kitchen? Clean up any spills right away. Avoid walking on wet floors. Keep items that you use a lot in easy-to-reach places. If you need to reach something above you, use a strong step stool that has a grab bar. Keep electrical cords out of the way. Do not use floor polish or wax that makes floors slippery. If you must use wax, use non-skid floor wax. Do not have throw rugs and other things on the floor that can make you trip. What can I do with my stairs? Do not leave any items on the stairs. Make sure that there are handrails on both sides of the stairs and use them. Fix handrails that are broken or loose. Make sure that handrails are as long as the stairways. Check any carpeting to make sure that it is firmly attached to the stairs. Fix any carpet that is loose or worn. Avoid having  throw rugs at the top or bottom of the stairs. If you do have throw rugs, attach them to the floor with carpet tape. Make sure that you have a light switch at the top of the stairs and the bottom of the stairs. If you do not have them, ask someone to add them for you. What else can I do to help prevent falls? Wear shoes that: Do not have high heels. Have rubber bottoms. Are comfortable and fit you well. Are closed at the toe. Do not wear sandals. If you use a stepladder: Make sure that it is fully opened. Do not climb a closed stepladder. Make sure that both sides of the stepladder are locked into place. Ask someone to hold it for you, if possible. Clearly mark and make sure that you can  see: Any grab bars or handrails. First and last steps. Where the edge of each step is. Use tools that help you move around (mobility aids) if they are needed. These include: Canes. Walkers. Scooters. Crutches. Turn on the lights when you go into a dark area. Replace any light bulbs as soon as they burn out. Set up your furniture so you have a clear path. Avoid moving your furniture around. If any of your floors are uneven, fix them. If there are any pets around you, be aware of where they are. Review your medicines with your doctor. Some medicines can make you feel dizzy. This can increase your chance of falling. Ask your doctor what other things that you can do to help prevent falls. This information is not intended to replace advice given to you by your health care provider. Make sure you discuss any questions you have with your health care provider. Document Released: 10/21/2008 Document Revised: 06/02/2015 Document Reviewed: 01/29/2014 Elsevier Interactive Patient Education  2017 [employer].

## 2020-07-01 NOTE — Progress Notes (Signed)
Virtual Visit via Telephone Note  I connected with  [name] on 07/01/20 at  3:15 PM EDT by telephone and verified that I am speaking with the correct person using two identifiers.  Medicare Annual Wellness visit completed telephonically due to Covid-19 pandemic.   Persons participating in this call: This Health Coach and this patient.   Location: Patient: Home Provider: Office   I discussed the limitations, risks, security and privacy concerns of performing an evaluation and management service by telephone and the availability of in person appointments. The patient expressed understanding and agreed to proceed.  Unable to perform video visit due to video visit attempted and failed and/or patient does not have video capability.   Some vital signs may be absent or patient reported.   Willette Brace, LPN   Subjective:   Joan Mclaughlin is a 80 y.o. female who presents for Medicare Annual (Subsequent) preventive examination.  Review of Systems     Cardiac Risk Factors include: advanced age (>72men, >55 women);dyslipidemia;hypertension     Objective:    There were no vitals filed for this visit. There is no height or weight on file to calculate BMI.  Advanced Directives 07/01/2020 03/10/2020 02/26/2020 11/02/2019 02/19/2019 02/18/2019 02/13/2019  Does Patient Have a Medical Advance Directive? Yes Yes Yes Yes Yes Yes Yes  Type of [place] administrator Power of [place] will - Living will  Does patient want to make changes to medical advance directive? - - - - No - Patient declined - No - Patient declined  Copy of [place] in Chart? Yes - validated most recent copy scanned in chart (See row information) Yes - validated most recent copy scanned in chart (See row information) - No - copy requested - - -  Would patient like information on creating a  medical advance directive? - - - - - - -    Current Medications (verified) Outpatient Encounter Medications as of 07/01/2020  Medication Sig   ALPRAZolam (XANAX) 1 MG tablet Take 0.5 tablets (0.5 mg total) by mouth at bedtime.   amLODipine (NORVASC) 5 MG tablet Take 0.5 tablets (2.5 mg total) by mouth daily. (Patient taking differently: Take 5 mg by mouth daily.)   Ascorbic Acid (VITAMIN C) 500 MG CAPS Take 500 mg by mouth daily.    CALCIUM-MAGNESIUM PO Take 1 tablet by mouth daily.   Cholecalciferol (VITAMIN D3) 250 MCG (10000 UT) capsule Take 10,000 Units by mouth daily.    hydrocortisone 2.5 % cream Apply topically 2 (two) times daily as needed (Rash). (Patient taking differently: Apply 1 application topically 2 (two) times daily as needed (Rash).)   hydrOXYzine (ATARAX/VISTARIL) 10 MG tablet Take 1-2 tablets (10-20 mg total) by mouth at bedtime.   ketoconazole (NIZORAL) 2 % shampoo Apply 1 application topically daily as needed for itching.   Multiple Vitamin (MULTIVITAMIN WITH MINERALS) TABS tablet Take 1 tablet by mouth daily.   Omega-3 Fatty Acids (SALMON OIL-1000 PO) Take 1,000 mg by mouth daily.   Oxcarbazepine (TRILEPTAL) 300 MG tablet Take 1 tablet (300mg ) by mouth twice daily   Potassium 99 MG TABS Take 99 mg by mouth daily.   Red Yeast Rice 600 MG CAPS Take 1,200 mg by mouth daily.   Turmeric 500 MG CAPS Take 500 mg by mouth daily.   valACYclovir (VALTREX) 500 MG tablet TAKE 1 TABLET (500 MG TOTAL) BY MOUTH DAILY AS NEEDED (  COLD SORES).   Zinc 30 MG CAPS Take 30 mg by mouth daily.   cetirizine (ZYRTEC) 10 MG tablet Take 10 mg by mouth daily as needed for allergies.  (Patient not taking: Reported on 07/01/2020)   metroNIDAZOLE (METROCREAM) 0.75 % cream Apply topically in the morning and at bedtime. (Patient not taking: Reported on 07/01/2020)   No facility-administered encounter medications on file as of 07/01/2020.    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    Nocturia    Pneumonia    PONV (postoperative nausea and vomiting)    Nausea   S/P laparoscopic cholecystectomy 12/23/2014   Seizure disorder ([place])    Seizures ([place])    Seizures ([place])    "last one 10- 15 years ago - 10/16/2018   Squamous cell carcinoma of skin 12/25/2018   RIGHT FOREARM    Squamous cell carcinoma of skin 09/30/2019   in situ-left lower leg-anterior   Trimalleolar fracture    left   Tubular adenoma of colon 06/27/2018   Past Surgical History:  Procedure Laterality Date   CHOLECYSTECTOMY N/A 12/23/2014   Procedure: LAPAROSCOPIC CHOLECYSTECTOMY WITH INTRAOPERATIVE CHOLANGIOGRAM;  Surgeon: Greer Pickerel, MD;  Location: [facility];  Service: General;  Laterality: N/A;   COLONOSCOPY     "3 times trying to get large polyp since August 2020"   COLONOSCOPY WITH PROPOFOL N/A 10/20/2018   Procedure: COLONOSCOPY WITH PROPOFOL;  Surgeon: Irving Copas., MD;  Location: [place];  Service: Gastroenterology;  Laterality: N/A;   COLONOSCOPY WITH PROPOFOL N/A 11/02/2019   Procedure: COLONOSCOPY WITH PROPOFOL;  Surgeon: Rush Landmark Telford Nab., MD;  Location: [place];  Service: Gastroenterology;  Laterality: N/A;   ENDOSCOPIC MUCOSAL RESECTION N/A 10/20/2018   Procedure: ENDOSCOPIC MUCOSAL  RESECTION;  Surgeon: Rush Landmark Telford Nab., MD;  Location: [place];  Service: Gastroenterology;  Laterality: N/A;   ENDOSCOPIC MUCOSAL RESECTION  11/02/2019   Procedure: ENDOSCOPIC MUCOSAL RESECTION;  Surgeon: Rush Landmark Telford Nab., MD;  Location: [place];  Service: Gastroenterology;;   HEMOSTASIS CLIP PLACEMENT  10/20/2018   Procedure: HEMOSTASIS CLIP PLACEMENT;  Surgeon: Irving Copas., MD;  Location: [place];  Service: Gastroenterology;;   HEMOSTASIS CLIP PLACEMENT  11/02/2019   Procedure: HEMOSTASIS CLIP PLACEMENT;  Surgeon: Irving Copas., MD;  Location: [place];  Service: Gastroenterology;;   ORIF ANKLE FRACTURE Left 02/19/2019   Procedure: OPEN REDUCTION INTERNAL FIXATION (ORIF) LEFT ANKLE FRACTURE;  Surgeon: Erle Crocker, MD;  Location: [place];  Service: Orthopedics;  Laterality: Left;   POLYPECTOMY  10/20/2018   Procedure: POLYPECTOMY;  Surgeon: Mansouraty, Telford Nab., MD;  Location: [place];  Service: Gastroenterology;;   POLYPECTOMY  11/02/2019   Procedure: POLYPECTOMY;  Surgeon: Rush Landmark Telford Nab., MD;  Location: [place];  Service: Gastroenterology;;   SUBMUCOSAL LIFTING INJECTION  10/20/2018   Procedure: SUBMUCOSAL LIFTING INJECTION;  Surgeon: Irving Copas., MD;  Location: [place];  Service: Gastroenterology;;   SYNDESMOSIS REPAIR Left 02/19/2019   Procedure: SYNDESMOSIS REPAIR;  Surgeon: Erle Crocker, MD;  Location: [place];  Service: Orthopedics;  Laterality: Left;   THORACIC DISCECTOMY  07/2007   T12   TUBAL LIGATION     WISDOM TOOTH EXTRACTION     Family History  Problem Relation Age of Onset   Arthritis Father    Alcohol abuse Mother        drinker and smoker   Diabetes Mother    Heart disease Mother    Hypertension Mother    Stroke Mother    Depression Sister    Hearing loss Sister    Hyperlipidemia Sister    Alcohol abuse Daughter    Alcohol abuse Son    Depression Sister     Early death Sister    Alcohol abuse Brother    Depression Sister    Heart disease Sister    Colon cancer Neg Hx    Esophageal cancer Neg Hx    Inflammatory bowel disease Neg Hx    Liver disease Neg Hx    Pancreatic cancer Neg Hx    Rectal cancer Neg Hx    Stomach cancer Neg Hx    Social History   Socioeconomic History   Marital status: Single    Spouse name: Not on file   Number of children: 4   Years of education: 14   Highest education level: Not on file  Occupational History   Occupation: retired    Comment: Equities trader payable and nanny   Tobacco Use   Smoking status: Never   Smokeless tobacco: Never  Vaping Use   Vaping Use: Never used  Substance and Sexual Activity   Alcohol use: No    Alcohol/week: 0.0 standard drinks    Comment: sober for 8 years-noted 09/16/2018   Drug use: No   Sexual activity: Not Currently  Other Topics Concern   Not on file  Social History Narrative   Patient lives alone.   patient has 4 grandchildren.   Patient is retired   Patient has 2 years of college.   Patient is right handed.         Social Determinants of Health   Financial Resource Strain: Low Risk    Difficulty of Paying Living Expenses: Not hard at all  Food Insecurity: No Food Insecurity   Worried About Charity fundraiser in the Last Year: Never true   [place] in the Last Year: Never true  Transportation Needs: No Transportation Needs   Lack of Transportation (Medical): No   Lack of Transportation (Non-Medical): No  Physical Activity: Insufficiently Active   Days of Exercise per Week: 2 days   Minutes of Exercise per Session: 60 min  Stress: No Stress Concern Present   Feeling of Stress : Not at all  Social Connections: Unknown   Frequency of Communication with Friends and Family: Three times a week   Frequency of Social Gatherings with Friends and Family: Three times a week   Attends Religious Services: More than 4 times per year   Active Member of Clubs  or Organizations: Yes   Attends Archivist Meetings: 1 to 4 times per year   Marital Status:  Not on file    Tobacco Counseling Counseling given: Not Answered   Clinical Intake:  Pre-visit preparation completed: Yes  Pain : No/denies pain     BMI - recorded: 24.77 Nutritional Status: BMI 25 -29 Overweight Nutritional Risks: None Diabetes: No  How often do you need to have someone help you when you read instructions, pamphlets, or other written materials from your doctor or pharmacy?: 1 - Never  Diabetic?No  Interpreter Needed?: No  Information entered by :: Charlott Rakes, LPN   Activities of Daily Living In your present state of health, do you have any difficulty performing the following activities: 07/01/2020 05/24/2020  Hearing? N N  Vision? N N  Difficulty concentrating or making decisions? N N  Walking or climbing stairs? Y N  Comment go slower -  Dressing or bathing? N N  Doing errands, shopping? N N  Preparing Food and eating ? N -  Using the Toilet? N -  In the past six months, have you accidently leaked urine? N -  Do you have problems with loss of bowel control? N -  Managing your Medications? N -  Managing your Finances? N -  Housekeeping or managing your Housekeeping? N -  Some recent data might be hidden    Patient Care Team: Vivi Barrack, MD as PCP - General (Family Medicine) Marilynne Halsted, MD as Referring Physician (Ophthalmology) Laurence Spates, MD (Inactive) as Consulting Physician (Gastroenterology) Warren Danes, PA-C as Physician Assistant (Dermatology) Cameron Sprang, MD as Consulting Physician (Neurology) Edrick Kins, DPM as Consulting Physician (Podiatry) Eads, [place], PA-C (Inactive) (Dermatology) Madelin Rear, [facility] as Pharmacist (Pharmacist)  Indicate any recent Medical Services you may have received from other than [facility] providers in the past year (date may be approximate).     Assessment:    This is a routine wellness examination for Joan Mclaughlin.  Hearing/Vision screen Hearing Screening - Comments:: Pt denies any hearing issues  Vision Screening - Comments:: Pt follows up annually with Dr Darnell Level   Dietary issues and exercise activities discussed: Current Exercise Habits: Structured exercise class, Type of exercise: Other - see comments (works with a Clinical research associate), Time (Minutes): 60, Frequency (Times/Week): 2, Weekly Exercise (Minutes/Week): 120   Goals Addressed             This Visit's Progress    get back to silver sneakers         Depression Screen PHQ 2/9 Scores 07/01/2020 06/30/2020 05/24/2020 04/27/2020 03/04/2020 01/19/2019 12/03/2017  PHQ - 2 Score 0 0 0 0 0 0 0  PHQ- 9 Score - 0 0 - - - 0    Fall Risk Fall Risk  07/01/2020 06/30/2020 05/24/2020 02/26/2020 01/19/2019  Falls in the past year? 0 0 0 0 0  Number falls in past yr: 0 0 0 0 -  Injury with Fall? 0 0 0 0 0  Risk for fall due to : Impaired vision No Fall Risks No Fall Risks - -  Follow up Falls prevention discussed - - - Falls evaluation completed;Education provided;Falls prevention discussed    FALL RISK PREVENTION PERTAINING TO THE HOME:  Any stairs in or around the home? Yes  If so, are there any without handrails? No  Home free of loose throw rugs in walkways, pet beds, electrical cords, etc? Yes  Adequate lighting in your home to reduce risk of falls? Yes   ASSISTIVE DEVICES UTILIZED TO PREVENT FALLS:  Life alert? No  Use of a cane, walker or  w/c? No  Grab bars in the bathroom? Yes  Shower chair or bench in shower? Yes  Elevated toilet seat or a handicapped toilet? No   TIMED UP AND GO:  Was the test performed? No     Cognitive Function:     6CIT Screen 07/01/2020  What Year? 0 points  What month? 0 points  What time? 0 points  Count back from 20 0 points  Months in reverse 0 points  Repeat phrase 0 points  Total Score 0    Immunizations Immunization History  Administered Date(s)  Administered   Influenza-Unspecified 10/22/2017   Moderna Sars-Covid-2 Vaccination 12/16/2019, 01/13/2020   Pneumococcal Conjugate-13 05/16/2016   Pneumococcal Polysaccharide-23 06/25/2013   Tdap 10/22/2017   Zoster Recombinat (Shingrix) 03/24/2017, 07/23/2017    TDAP status: Up to date  Flu Vaccine status: Declined, Education has been provided regarding the importance of this vaccine but patient still declined. Advised may receive this vaccine at local pharmacy or Health Dept. Aware to provide a copy of the vaccination record if obtained from local pharmacy or Health Dept. Verbalized acceptance and understanding.  Pneumococcal vaccine status: Up to date  Covid-19 vaccine status: Completed vaccines  Qualifies for Shingles Vaccine? Yes   Zostavax completed Yes   Shingrix Completed?: Yes  Screening Tests Health Maintenance  Topic Date Due   COVID-19 Vaccine (3 - Moderna risk series) 01/06/2021 (Originally 02/10/2020)   INFLUENZA VACCINE  08/08/2020   PNA vac Low Risk Adult  Completed   Zoster Vaccines- Shingrix  Completed   HPV VACCINES  Aged Out   DEXA SCAN  Discontinued   TETANUS/TDAP  Discontinued    Health Maintenance  There are no preventive care reminders to display for this patient.  Colorectal cancer screening pt stated appt made for sept 2022   Additional Screening:   Vision Screening: Recommended annual ophthalmology exams for early detection of glaucoma and other disorders of the eye. Is the patient up to date with their annual eye exam?  Yes  Who is the provider or what is the name of the office in which the patient attends annual eye exams? Dr Darnell Level If pt is not established with a provider, would they like to be referred to a provider to establish care? No .   Dental Screening: Recommended annual dental exams for proper oral hygiene  Community Resource Referral / Chronic Care Management: CRR required this visit?  No   CCM required this visit?  No       Plan:     I have personally reviewed and noted the following in the patient's chart:   Medical and social history Use of alcohol, tobacco or illicit drugs  Current medications and supplements including opioid prescriptions.  Functional ability and status Nutritional status Physical activity Advanced directives List of other physicians Hospitalizations, surgeries, and ER visits in previous 12 months Vitals Screenings to include cognitive, depression, and falls Referrals and appointments  In addition, I have reviewed and discussed with patient certain preventive protocols, quality metrics, and best practice recommendations. A written personalized care plan for preventive services as well as general preventive health recommendations were provided to patient.     Willette Brace, LPN   1/69/6789   Nurse Notes: None

## 2020-07-04 NOTE — Telephone Encounter (Signed)
The pt has been rescheduled to 08/31/20 at 730 am new instructions have been mailed to the pt home.  The pt advised over the phone as well.

## 2020-07-06 DIAGNOSIS — M25562 Pain in left knee: Secondary | ICD-10-CM | POA: Diagnosis not present

## 2020-07-06 DIAGNOSIS — M25572 Pain in left ankle and joints of left foot: Secondary | ICD-10-CM | POA: Diagnosis not present

## 2020-07-15 ENCOUNTER — Telehealth: Payer: Self-pay

## 2020-07-15 NOTE — Chronic Care Management (AMB) (Signed)
    Chronic Care Management Pharmacy Assistant   Name: Joan Mclaughlin  MRN: [id number] DOB: [birth year]  Reason for Encounter: General Patient Call   Recent office visits:  06/30/20 Dimas Chyle MD(PCP)- Patient seen for left knee pain. No Medication changes and advised to follow up in 6 months.  Recent consult visits:  No visits  noted  Hospital visits:  None in previous 6 months  Medications: Outpatient Encounter Medications as of 07/15/2020  Medication Sig   ALPRAZolam (XANAX) 1 MG tablet Take 0.5 tablets (0.5 mg total) by mouth at bedtime.   amLODipine (NORVASC) 5 MG tablet Take 0.5 tablets (2.5 mg total) by mouth daily. (Patient taking differently: Take 5 mg by mouth daily.)   Ascorbic Acid (VITAMIN C) 500 MG CAPS Take 500 mg by mouth daily.    CALCIUM-MAGNESIUM PO Take 1 tablet by mouth daily.   cetirizine (ZYRTEC) 10 MG tablet Take 10 mg by mouth daily as needed for allergies.  (Patient not taking: Reported on 07/01/2020)   Cholecalciferol (VITAMIN D3) 250 MCG (10000 UT) capsule Take 10,000 Units by mouth daily.    hydrocortisone 2.5 % cream Apply topically 2 (two) times daily as needed (Rash). (Patient taking differently: Apply 1 application topically 2 (two) times daily as needed (Rash).)   hydrOXYzine (ATARAX/VISTARIL) 10 MG tablet Take 1-2 tablets (10-20 mg total) by mouth at bedtime.   ketoconazole (NIZORAL) 2 % shampoo Apply 1 application topically daily as needed for itching.   metroNIDAZOLE (METROCREAM) 0.75 % cream Apply topically in the morning and at bedtime. (Patient not taking: Reported on 07/01/2020)   Multiple Vitamin (MULTIVITAMIN WITH MINERALS) TABS tablet Take 1 tablet by mouth daily.   Omega-3 Fatty Acids (SALMON OIL-1000 PO) Take 1,000 mg by mouth daily.   Oxcarbazepine (TRILEPTAL) 300 MG tablet Take 1 tablet (300mg ) by mouth twice daily   Potassium 99 MG TABS Take 99 mg by mouth daily.   Red Yeast Rice 600 MG CAPS Take 1,200 mg by mouth daily.   Turmeric  500 MG CAPS Take 500 mg by mouth daily.   valACYclovir (VALTREX) 500 MG tablet TAKE 1 TABLET (500 MG TOTAL) BY MOUTH DAILY AS NEEDED (COLD SORES).   Zinc 30 MG CAPS Take 30 mg by mouth daily.   No facility-administered encounter medications on file as of 07/15/2020.    Patient stated she is taking her Blood Pressure and Cholesterol medication at bedtime per CPP recommendations and it is helping at the moment. She stated that her last meal for the day is typically around 5p-6p and she tries to eat no later than 7p. She stated that she adjusts her room temperature before bed and it helps her fall asleep however she is not getting a full nights rest. She usually watches TV until she falls asleep around 9p or 10p. I encouraged her to follow the CPP's recommendations of avoiding TV directly before bed. She agreed to try to follow the recommendations given to her.  Have you had any problems recently with your health? Patient stated she was lifting a 50 lb bag of rocks 2 weeks ago and injured her back and left knee. She was seen by her PCP 06/30/20 and has been taking 2 tablets of Advil twice daily. She also stated she uses compression and ice to alleviate pain.  [facility] [facility] CPA,CMA

## 2020-08-05 ENCOUNTER — Telehealth: Payer: Self-pay | Admitting: Pharmacist

## 2020-08-05 NOTE — Chronic Care Management (AMB) (Signed)
    Chronic Care Management Pharmacy Assistant   Name: Joan Mclaughlin  MRN: [id number] DOB: 09-Apr-1940  Reason for Encounter: Chart Review    Recent office visits:  None  Recent consult visits:  None  Hospital visits:  None in previous 6 months  Medications: Outpatient Encounter Medications as of 08/05/2020  Medication Sig   ALPRAZolam (XANAX) 1 MG tablet Take 0.5 tablets (0.5 mg total) by mouth at bedtime.   amLODipine (NORVASC) 5 MG tablet Take 0.5 tablets (2.5 mg total) by mouth daily. (Patient taking differently: Take 5 mg by mouth daily.)   Ascorbic Acid (VITAMIN C) 500 MG CAPS Take 500 mg by mouth daily.    CALCIUM-MAGNESIUM PO Take 1 tablet by mouth daily.   cetirizine (ZYRTEC) 10 MG tablet Take 10 mg by mouth daily as needed for allergies.  (Patient not taking: Reported on 07/01/2020)   Cholecalciferol (VITAMIN D3) 250 MCG (10000 UT) capsule Take 10,000 Units by mouth daily.    hydrocortisone 2.5 % cream Apply topically 2 (two) times daily as needed (Rash). (Patient taking differently: Apply 1 application topically 2 (two) times daily as needed (Rash).)   hydrOXYzine (ATARAX/VISTARIL) 10 MG tablet Take 1-2 tablets (10-20 mg total) by mouth at bedtime.   ketoconazole (NIZORAL) 2 % shampoo Apply 1 application topically daily as needed for itching.   metroNIDAZOLE (METROCREAM) 0.75 % cream Apply topically in the morning and at bedtime. (Patient not taking: Reported on 07/01/2020)   Multiple Vitamin (MULTIVITAMIN WITH MINERALS) TABS tablet Take 1 tablet by mouth daily.   Omega-3 Fatty Acids (SALMON OIL-1000 PO) Take 1,000 mg by mouth daily.   Oxcarbazepine (TRILEPTAL) 300 MG tablet Take 1 tablet ('300mg'$ ) by mouth twice daily   Potassium 99 MG TABS Take 99 mg by mouth daily.   Red Yeast Rice 600 MG CAPS Take 1,200 mg by mouth daily.   Turmeric 500 MG CAPS Take 500 mg by mouth daily.   valACYclovir (VALTREX) 500 MG tablet TAKE 1 TABLET (500 MG TOTAL) BY MOUTH DAILY AS NEEDED  (COLD SORES).   Zinc 30 MG CAPS Take 30 mg by mouth daily.   No facility-administered encounter medications on file as of 08/05/2020.    Reviewed chart for medication changes.  No OVs, Consults, or hospital visits since last care coordination call/Pharmacist visit.  No medication changes indicated.  Patient has a scheduled follow up with the clinical pharmacist.  No gaps in adherence identified.  Future Appointments  Date Time Provider [place]  08/16/2020 11:00 AM Warren Danes, [place] [facility]  01/10/2021  3:30 PM [facility] PHARMACIST [facility]  01/18/2021 11:00 AM Vivi Barrack, MD [facility]  02/27/2021  4:00 PM Cameron Sprang, MD [facility] None  07/06/2021  9:30 AM [facility]     April D Calhoun, [place] Pharmacist Assistant [phone]

## 2020-08-12 ENCOUNTER — Other Ambulatory Visit: Payer: Self-pay

## 2020-08-12 ENCOUNTER — Other Ambulatory Visit: Payer: Self-pay | Admitting: Family Medicine

## 2020-08-12 NOTE — Telephone Encounter (Signed)
MEDICATION: ALPRAZolam (XANAX) 1 MG tablet  PHARMACY:  [id number]- [place], [place] - [address] AT [address]:  [phone] Fax:  [phone]     Comments:   **Let patient know to contact pharmacy at the end of the day to make sure medication is ready. **  ** Please notify patient to allow 48-72 hours to process**  **Encourage patient to contact the pharmacy for refills or [facility]*

## 2020-08-12 NOTE — Telephone Encounter (Signed)
PT requesting refill for Alprazolam. Last OV 06/2020.

## 2020-08-13 ENCOUNTER — Other Ambulatory Visit: Payer: Self-pay | Admitting: Family Medicine

## 2020-08-13 MED ORDER — ALPRAZOLAM 1 MG PO TABS: 0.5000 mg | ORAL_TABLET | Freq: Every day | ORAL | 0 refills | Status: DC

## 2020-08-14 ENCOUNTER — Other Ambulatory Visit: Payer: Self-pay | Admitting: Dermatology

## 2020-08-16 ENCOUNTER — Encounter: Payer: Self-pay | Admitting: Physician Assistant

## 2020-08-16 ENCOUNTER — Other Ambulatory Visit: Payer: Self-pay

## 2020-08-16 ENCOUNTER — Ambulatory Visit: Payer: Medicare Other | Admitting: Physician Assistant

## 2020-08-16 DIAGNOSIS — Z1283 Encounter for screening for malignant neoplasm of skin: Secondary | ICD-10-CM

## 2020-08-16 DIAGNOSIS — R21 Rash and other nonspecific skin eruption: Secondary | ICD-10-CM | POA: Diagnosis not present

## 2020-08-16 NOTE — Progress Notes (Signed)
   Follow-Up Visit   Subjective  Joan Mclaughlin is a 80 y.o. female who presents for the following: Annual Exam (Full body exam per patient scale areas on the legs x months,history of scc ).   The following portions of the chart were reviewed this encounter and updated as appropriate:  Tobacco  Allergies  Meds  Problems  Med Hx  Surg Hx  Fam Hx      Objective  Well appearing patient in no apparent distress; mood and affect are within normal limits.  A full examination was performed including scalp, head, eyes, ears, nose, lips, neck, chest, axillae, abdomen, back, buttocks, bilateral upper extremities, bilateral lower extremities, hands, feet, fingers, toes, fingernails, and toenails. All findings within normal limits unless otherwise noted below.  No signs of non mole skin cancer or atypical nevus. She did have a dark seborrheic keratosis on her left shin.  Right Postauricular Area Erythema and scale. No redness or drainage today.   Assessment & Plan  Rash and other nonspecific skin eruption Right Postauricular Area  Patient already has hytone 2.5% ointment that she will use- call if no better    I, Baer Hinton, PA-C, have reviewed all documentation's for this visit.  The documentation on 08/16/20 for the exam, diagnosis, procedures and orders are all accurate and complete.

## 2020-08-24 ENCOUNTER — Telehealth: Payer: Self-pay | Admitting: Gastroenterology

## 2020-08-24 NOTE — Telephone Encounter (Signed)
Left message on machine to call back  

## 2020-08-25 ENCOUNTER — Encounter (HOSPITAL_COMMUNITY): Payer: Self-pay | Admitting: Gastroenterology

## 2020-08-25 ENCOUNTER — Other Ambulatory Visit: Payer: Self-pay

## 2020-08-25 NOTE — Telephone Encounter (Signed)
Left message on machine to call back  

## 2020-08-25 NOTE — Telephone Encounter (Signed)
I spoke with the pt and she wanted to confirm the time and time of arrival.  We also discussed the prep instructions.  The pt verbalized  understanding and was told to call back if she has any further questions.

## 2020-08-30 NOTE — Anesthesia Preprocedure Evaluation (Addendum)
Anesthesia Evaluation  Patient identified by MRN, date of birth, ID band Patient awake    Reviewed: Allergy & Precautions, NPO status , Patient's Chart, lab work & pertinent test results  History of Anesthesia Complications (+) PONV and history of anesthetic complications  Airway Mallampati: II  TM Distance: >3 FB Neck ROM: Full    Dental  (+) Dental Advisory Given   Pulmonary      Pulmonary exam normal        Cardiovascular hypertension, Normal cardiovascular exam     Neuro/Psych Seizures -, Well Controlled,  PSYCHIATRIC DISORDERS Anxiety    GI/Hepatic Neg liver ROS, GERD  ,  Endo/Other  negative endocrine ROS  Renal/GU negative Renal ROSLab Results      Component                Value               Date                      CREATININE               0.52                02/19/2019                Musculoskeletal negative musculoskeletal ROS (+)   Abdominal   Peds  Hematology negative hematology ROS (+) Lab Results      Component                Value               Date                      WBC                      7.4                 02/19/2019                HGB                      13.3                02/19/2019                HCT                      40.0                02/19/2019                MCV                      93.0                02/19/2019                PLT                      299                 02/19/2019              Anesthesia Other Findings   Reproductive/Obstetrics  Anesthesia Physical  Anesthesia Plan  ASA: 2  Anesthesia Plan: MAC   Post-op Pain Management:    Induction: Intravenous  PONV Risk Score and Plan: 3 and Propofol infusion, Treatment may vary due to age or medical condition and Ondansetron  Airway Management Planned: Nasal Cannula  Additional Equipment: None  Intra-op Plan:   Post-operative Plan:   Informed  Consent: I have reviewed the patients History and Physical, chart, labs and discussed the procedure including the risks, benefits and alternatives for the proposed anesthesia with the patient or authorized representative who has indicated his/her understanding and acceptance.     Dental advisory given  Plan Discussed with: Anesthesiologist and CRNA  Anesthesia Plan Comments:        Anesthesia Quick Evaluation

## 2020-08-31 ENCOUNTER — Ambulatory Visit (HOSPITAL_COMMUNITY): Payer: Medicare Other | Admitting: Anesthesiology

## 2020-08-31 ENCOUNTER — Other Ambulatory Visit: Payer: Self-pay

## 2020-08-31 ENCOUNTER — Ambulatory Visit (HOSPITAL_COMMUNITY)
Admission: RE | Admit: 2020-08-31 | Discharge: 2020-08-31 | Disposition: A | Discharge status: Home / Self Care | Payer: Medicare Other | Attending: Gastroenterology | Admitting: Gastroenterology

## 2020-08-31 ENCOUNTER — Encounter (HOSPITAL_COMMUNITY): Payer: Self-pay | Admitting: Gastroenterology

## 2020-08-31 ENCOUNTER — Encounter (HOSPITAL_COMMUNITY)
Admission: RE | Disposition: A | Discharge status: Home / Self Care | Payer: Self-pay | Source: Home / Self Care | Attending: Gastroenterology

## 2020-08-31 DIAGNOSIS — K5730 Diverticulosis of large intestine without perforation or abscess without bleeding: Secondary | ICD-10-CM | POA: Diagnosis not present

## 2020-08-31 DIAGNOSIS — Z888 Allergy status to other drugs, medicaments and biological substances status: Secondary | ICD-10-CM | POA: Diagnosis not present

## 2020-08-31 DIAGNOSIS — K635 Polyp of colon: Secondary | ICD-10-CM | POA: Diagnosis not present

## 2020-08-31 DIAGNOSIS — Z833 Family history of diabetes mellitus: Secondary | ICD-10-CM | POA: Diagnosis not present

## 2020-08-31 DIAGNOSIS — Z823 Family history of stroke: Secondary | ICD-10-CM | POA: Diagnosis not present

## 2020-08-31 DIAGNOSIS — Z8261 Family history of arthritis: Secondary | ICD-10-CM | POA: Insufficient documentation

## 2020-08-31 DIAGNOSIS — Z09 Encounter for follow-up examination after completed treatment for conditions other than malignant neoplasm: Secondary | ICD-10-CM | POA: Insufficient documentation

## 2020-08-31 DIAGNOSIS — G40909 Epilepsy, unspecified, not intractable, without status epilepticus: Secondary | ICD-10-CM | POA: Diagnosis not present

## 2020-08-31 DIAGNOSIS — D126 Benign neoplasm of colon, unspecified: Secondary | ICD-10-CM

## 2020-08-31 DIAGNOSIS — Z8349 Family history of other endocrine, nutritional and metabolic diseases: Secondary | ICD-10-CM | POA: Diagnosis not present

## 2020-08-31 DIAGNOSIS — D123 Benign neoplasm of transverse colon: Secondary | ICD-10-CM | POA: Diagnosis not present

## 2020-08-31 DIAGNOSIS — K644 Residual hemorrhoidal skin tags: Secondary | ICD-10-CM | POA: Insufficient documentation

## 2020-08-31 DIAGNOSIS — K642 Third degree hemorrhoids: Secondary | ICD-10-CM | POA: Diagnosis not present

## 2020-08-31 DIAGNOSIS — Z8249 Family history of ischemic heart disease and other diseases of the circulatory system: Secondary | ICD-10-CM | POA: Insufficient documentation

## 2020-08-31 DIAGNOSIS — Z880 Allergy status to penicillin: Secondary | ICD-10-CM | POA: Insufficient documentation

## 2020-08-31 DIAGNOSIS — Z86010 Personal history of colonic polyps: Secondary | ICD-10-CM | POA: Diagnosis not present

## 2020-08-31 HISTORY — PX: COLONOSCOPY WITH PROPOFOL: SHX5780

## 2020-08-31 HISTORY — PX: POLYPECTOMY: SHX5525

## 2020-08-31 SURGERY — COLONOSCOPY WITH PROPOFOL
Anesthesia: Monitor Anesthesia Care

## 2020-08-31 MED ORDER — SODIUM CHLORIDE 0.9 % IV SOLN: INTRAVENOUS | Status: DC

## 2020-08-31 MED ORDER — LIDOCAINE HCL (CARDIAC) PF 100 MG/5ML IV SOSY
PREFILLED_SYRINGE | INTRAVENOUS | Status: DC | PRN
  Administered 2020-08-31: 40 mg via INTRATRACHEAL
  Administered 2020-08-31: 60 mg via INTRATRACHEAL

## 2020-08-31 MED ORDER — LACTATED RINGERS IV SOLN: INTRAVENOUS | Status: DC

## 2020-08-31 MED ORDER — PROPOFOL 500 MG/50ML IV EMUL
INTRAVENOUS | Status: DC | PRN
  Administered 2020-08-31: 100 ug/kg/min via INTRAVENOUS

## 2020-08-31 MED ORDER — PROPOFOL 500 MG/50ML IV EMUL
INTRAVENOUS | Status: DC | PRN
  Administered 2020-08-31: 30 mg via INTRAVENOUS
  Administered 2020-08-31 (×2): 20 mg via INTRAVENOUS
  Administered 2020-08-31: 30 mg via INTRAVENOUS

## 2020-08-31 MED ORDER — LACTATED RINGERS IV SOLN
INTRAVENOUS | Status: DC
  Administered 2020-08-31: 1000 mL via INTRAVENOUS

## 2020-08-31 MED ORDER — ONDANSETRON HCL 4 MG/2ML IJ SOLN
INTRAMUSCULAR | Status: DC | PRN
  Administered 2020-08-31: 4 mg via INTRAVENOUS

## 2020-08-31 MED ORDER — PROPOFOL 10 MG/ML IV BOLUS
INTRAVENOUS | Status: AC
  Filled 2020-08-31: qty 20

## 2020-08-31 MED ORDER — PROPOFOL 1000 MG/100ML IV EMUL
INTRAVENOUS | Status: AC
  Filled 2020-08-31: qty 100

## 2020-08-31 SURGICAL SUPPLY — 21 items

## 2020-08-31 NOTE — Anesthesia Procedure Notes (Signed)
Procedure Name: MAC Date/Time: 08/31/2020 7:45 AM Performed by: Lyndal Pulley, CRNA Pre-anesthesia Checklist: Patient identified, Suction available, Emergency Drugs available and Patient being monitored Patient Re-evaluated:Patient Re-evaluated prior to induction Oxygen Delivery Method: Simple face mask

## 2020-08-31 NOTE — Anesthesia Postprocedure Evaluation (Signed)
Anesthesia Post Note  Patient: Joan Mclaughlin  Procedure(s) Performed: COLONOSCOPY WITH PROPOFOL ENDOSCOPIC MUCOSAL RESECTION     Patient location during evaluation: PACU Anesthesia Type: MAC Level of consciousness: awake and alert Pain management: pain level controlled Vital Signs Assessment: post-procedure vital signs reviewed and stable Respiratory status: spontaneous breathing and respiratory function stable Cardiovascular status: stable Postop Assessment: no apparent nausea or vomiting Anesthetic complications: no   No notable events documented.  Last Vitals:  Vitals:   08/31/20 0820 08/31/20 0830  BP: (!) 142/87 (!) 146/54  Pulse: 70 63  Resp: 13 14  Temp: 36.6 C   SpO2: 100% 99%    Last Pain:  Vitals:   08/31/20 0830  TempSrc:   PainSc: 0-No pain                 Reakwon Barren DANIEL

## 2020-08-31 NOTE — H&P (Signed)
GASTROENTEROLOGY PROCEDURE H&P NOTE   Primary Care Physician: Vivi Barrack, MD  HPI: Joan Mclaughlin is a 80 y.o. female who presents for Colonoscopy with TC EMR s/p 3 attempts at prior resection.  Past Medical History:  Diagnosis Date   Alcoholism ([place])    sober for 84 years-noted 09/16/2018   Allergy    Anxiety    Cancer ([place])    skin cancer   Cholecystitis    Complication of anesthesia    Difficulty sleeping    takes med to sleep   Diverticulitis    Family history of adverse reaction to anesthesia    SIster - N/V   Gallstones    Ganglion cyst 12/11/2016   GERD (gastroesophageal reflux disease)    "RELATED TO GALLBLADDER"   Heart murmur    "ONLY DETECTED WHEN LYING DOWN"  - 10/16/2018- Not concerned with   History of Lyme disease 09/19/2016   Hyperlipidemia    Hypertension    Lyme disease    Pt states she has had Lyme Disease twice.    Nocturia    Pneumonia    PONV (postoperative nausea and vomiting)    Nausea   S/P laparoscopic cholecystectomy 12/23/2014   Seizure disorder ([place])    Seizures ([place])    Seizures ([place])    "last one 10- 15 years ago - 10/16/2018   Squamous cell carcinoma of skin 12/25/2018   RIGHT FOREARM    Squamous cell carcinoma of skin 09/30/2019   in situ-left lower leg-anterior   Trimalleolar fracture    left   Tubular adenoma of colon 06/27/2018   Past Surgical History:  Procedure Laterality Date   CHOLECYSTECTOMY N/A 12/23/2014   Procedure: LAPAROSCOPIC CHOLECYSTECTOMY WITH INTRAOPERATIVE CHOLANGIOGRAM;  Surgeon: Greer Pickerel, MD;  Location: [facility];  Service: General;  Laterality: N/A;   COLONOSCOPY     "3 times trying to get large polyp since August 2020"   COLONOSCOPY WITH PROPOFOL N/A 10/20/2018   Procedure: COLONOSCOPY WITH PROPOFOL;  Surgeon: Irving Copas., MD;  Location: [facility];  Service: Gastroenterology;  Laterality: N/A;   COLONOSCOPY WITH PROPOFOL N/A 11/02/2019   Procedure: COLONOSCOPY WITH PROPOFOL;   Surgeon: Rush Landmark Telford Nab., MD;  Location: [facility];  Service: Gastroenterology;  Laterality: N/A;   ENDOSCOPIC MUCOSAL RESECTION N/A 10/20/2018   Procedure: ENDOSCOPIC MUCOSAL RESECTION;  Surgeon: Rush Landmark Telford Nab., MD;  Location: [facility];  Service: Gastroenterology;  Laterality: N/A;   ENDOSCOPIC MUCOSAL RESECTION  11/02/2019   Procedure: ENDOSCOPIC MUCOSAL RESECTION;  Surgeon: Rush Landmark Telford Nab., MD;  Location: [facility];  Service: Gastroenterology;;   HEMOSTASIS CLIP PLACEMENT  10/20/2018   Procedure: HEMOSTASIS CLIP PLACEMENT;  Surgeon: Irving Copas., MD;  Location: [facility];  Service: Gastroenterology;;   HEMOSTASIS CLIP PLACEMENT  11/02/2019   Procedure: HEMOSTASIS CLIP PLACEMENT;  Surgeon: Irving Copas., MD;  Location: [facility];  Service: Gastroenterology;;   ORIF ANKLE FRACTURE Left 02/19/2019   Procedure: OPEN REDUCTION INTERNAL FIXATION (ORIF) LEFT ANKLE FRACTURE;  Surgeon: Erle Crocker, MD;  Location: [facility];  Service: Orthopedics;  Laterality: Left;   POLYPECTOMY  10/20/2018   Procedure: POLYPECTOMY;  Surgeon: Rush Landmark Telford Nab., MD;  Location: [facility];  Service: Gastroenterology;;   POLYPECTOMY  11/02/2019   Procedure: POLYPECTOMY;  Surgeon: Irving Copas., MD;  Location: [facility];  Service: Gastroenterology;;   SUBMUCOSAL LIFTING INJECTION  10/20/2018   Procedure: SUBMUCOSAL LIFTING INJECTION;  Surgeon: Irving Copas., MD;  Location: [facility];  Service: Gastroenterology;;  SYNDESMOSIS REPAIR Left 02/19/2019   Procedure: SYNDESMOSIS REPAIR;  Surgeon: Erle Crocker, MD;  Location: [name];  Service: Orthopedics;  Laterality: Left;   THORACIC DISCECTOMY  07/2007   T12   TUBAL LIGATION     WISDOM TOOTH EXTRACTION     Current Facility-Administered Medications  Medication Dose Route Frequency Provider Last Rate Last Admin   0.9 %  sodium chloride infusion   Intravenous Continuous  Zehr, Jessica D, PA-C       lactated ringers infusion   Intravenous Continuous Mansouraty, Telford Nab., MD 10 mL/hr at 08/31/20 [id number] Continued from Pre-op at 08/31/20 [id number]   lactated ringers infusion   Intravenous Continuous Mansouraty, Telford Nab., MD        Current Facility-Administered Medications:    0.9 %  sodium chloride infusion, , Intravenous, Continuous, Zehr, Jessica D, PA-C   lactated ringers infusion, , Intravenous, Continuous, Mansouraty, Telford Nab., MD, Last Rate: 10 mL/hr at 08/31/20 [id number], Continued from Pre-op at 08/31/20 [id number]   lactated ringers infusion, , Intravenous, Continuous, Mansouraty, Telford Nab., MD Allergies  Allergen Reactions   Amoxicillin-Pot Clavulanate Nausea And Vomiting    Patient denies knowledge of this allergy  Did it involve swelling of the face/tongue/throat, SOB, or low BP? No Did it involve sudden or severe rash/hives, skin peeling, or any reaction on the inside of your mouth or nose? No Did you need to seek medical attention at a hospital or doctor's office? No When did it last happen?      in 2016 If all above answers are "NO", may proceed with cephalosporin use.    Suvorexant Other (See Comments)    Nightmares    Family History  Problem Relation Age of Onset   Arthritis Father    Alcohol abuse Mother        drinker and smoker   Diabetes Mother    Heart disease Mother    Hypertension Mother    Stroke Mother    Depression Sister    Hearing loss Sister    Hyperlipidemia Sister    Alcohol abuse Daughter    Alcohol abuse Son    Depression Sister    Early death Sister    Alcohol abuse Brother    Depression Sister    Heart disease Sister    Colon cancer Neg Hx    Esophageal cancer Neg Hx    Inflammatory bowel disease Neg Hx    Liver disease Neg Hx    Pancreatic cancer Neg Hx    Rectal cancer Neg Hx    Stomach cancer Neg Hx    Social History   Socioeconomic History   Marital status: Single    Spouse name: Not on file   Number  of children: 4   Years of education: 14   Highest education level: Not on file  Occupational History   Occupation: retired    Comment: Equities trader payable and nanny   Tobacco Use   Smoking status: Never   Smokeless tobacco: Never  Vaping Use   Vaping Use: Never used  Substance and Sexual Activity   Alcohol use: No    Alcohol/week: 0.0 standard drinks    Comment: sober for 35 years-noted 09/16/2018   Drug use: No   Sexual activity: Not Currently  Other Topics Concern   Not on file  Social History Narrative   Patient lives alone.   patient has 4 grandchildren.   Patient is retired   Patient has 2 years of college.   Patient  is right handed.         Social Determinants of Health   Financial Resource Strain: Low Risk    Difficulty of Paying Living Expenses: Not hard at all  Food Insecurity: No Food Insecurity   Worried About Charity fundraiser in the Last Year: Never true   [place] in the Last Year: Never true  Transportation Needs: No Transportation Needs   Lack of Transportation (Medical): No   Lack of Transportation (Non-Medical): No  Physical Activity: Insufficiently Active   Days of Exercise per Week: 2 days   Minutes of Exercise per Session: 60 min  Stress: No Stress Concern Present   Feeling of Stress : Not at all  Social Connections: Unknown   Frequency of Communication with Friends and Family: Three times a week   Frequency of Social Gatherings with Friends and Family: Three times a week   Attends Religious Services: More than 4 times per year   Active Member of Clubs or Organizations: Yes   Attends Archivist Meetings: 1 to 4 times per year   Marital Status: Not on file  Intimate Partner Violence: Not At Risk   Fear of Current or Ex-Partner: No   Emotionally Abused: No   Physically Abused: No   Sexually Abused: No    Physical Exam: Vital signs in last 24 hours: Temp:  [98.3 F (36.8 C)] 98.3 F (36.8 C) (08/24 0704) Pulse Rate:  [90]  90 (08/24 0704) Resp:  [24] 24 (08/24 0704) BP: (184)/(80) 184/80 (08/24 0704) SpO2:  [100 %] 100 % (08/24 0704) Weight:  [62.1 kg] 62.1 kg (08/24 0704)   GEN: NAD EYE: Sclerae anicteric ENT: MMM CV: Non-tachycardic GI: Soft, NT/ND NEURO:  Alert & Oriented x 3  Lab Results: No results for input(s): WBC, HGB, HCT, PLT in the last 72 hours. BMET No results for input(s): NA, K, CL, CO2, GLUCOSE, BUN, CREATININE, CALCIUM in the last 72 hours. LFT No results for input(s): PROT, ALBUMIN, AST, ALT, ALKPHOS, BILITOT, BILIDIR, IBILI in the last 72 hours. PT/INR No results for input(s): LABPROT, INR in the last 72 hours.   Impression / Plan: This is a 80 y.o.female who presents for Colonoscopy with TC EMR s/p 3 attempts at prior resection.  The risks and benefits of endoscopic evaluation/treatment were discussed with the patient and/or family; these include but are not limited to the risk of perforation, infection, bleeding, missed lesions, lack of diagnosis, severe illness requiring hospitalization, as well as anesthesia and sedation related illnesses.  The patient's history has been reviewed, patient examined, no change in status, and deemed stable for procedure.  The patient and/or family is agreeable to proceed.    Justice Britain, MD [facility] # [phone]

## 2020-08-31 NOTE — Transfer of Care (Signed)
Immediate Anesthesia Transfer of Care Note  Patient: Joan Mclaughlin  Procedure(s) Performed: COLONOSCOPY WITH PROPOFOL ENDOSCOPIC MUCOSAL RESECTION  Patient Location: PACU  Anesthesia Type:MAC  Level of Consciousness: awake and patient cooperative  Airway & Oxygen Therapy: Patient Spontanous Breathing  Post-op Assessment: Report given to RN and Post -op Vital signs reviewed and stable  Post vital signs: Reviewed and stable  Last Vitals:  Vitals Value Taken Time  BP    Temp    Pulse    Resp    SpO2      Last Pain:  Vitals:   08/31/20 0704  TempSrc: Oral  PainSc: 0-No pain         Complications: No notable events documented.

## 2020-08-31 NOTE — Discharge Instructions (Signed)

## 2020-08-31 NOTE — Op Note (Signed)
[facility] Patient Name: Joan Mclaughlin Procedure Date: 08/31/2020 MRN: [id number] Attending MD: Justice Britain , MD Date of Birth: Mar 05, 1940 CSN: [id number] Age: 80 Admit Type: Outpatient Procedure:                Colonoscopy Indications:              Surveillance: History of piecemeal removal                            transverse colon adenoma on in 2020 (referred after                            failed resection) with followup in 2021 with some                            recurrence Providers:                Justice Britain, MD, Carlyn Reichert, RN, Tyrone Apple, Technician Referring MD:             Joaquim Nam, Ronnette Juniper, MD, [name]. [place] Medicines:                Monitored Anesthesia Care Complications:            No immediate complications. Estimated Blood Loss:     Estimated blood loss was minimal. Procedure:                Pre-Anesthesia Assessment:                           - Prior to the procedure, a History and Physical                            was performed, and patient medications and                            allergies were reviewed. The patient's tolerance of                            previous anesthesia was also reviewed. The risks                            and benefits of the procedure and the sedation                            options and risks were discussed with the patient.                            All questions were answered, and informed consent                            was obtained. Prior Anticoagulants: The patient has  taken no previous anticoagulant or antiplatelet                            agents. ASA Grade Assessment: III - A patient with                            severe systemic disease. After reviewing the risks                            and benefits, the patient was deemed in                            satisfactory condition to undergo the procedure.                            After obtaining informed consent, the colonoscope                            was passed under direct vision. Throughout the                            procedure, the patient's blood pressure, pulse, and                            oxygen saturations were monitored continuously. The                            [id number]-HQ190L ([id number] Olympus colonoscope was                            introduced through the anus and advanced to the 5                            cm into the ileum. The colonoscopy was performed                            without difficulty. The patient tolerated the                            procedure. The quality of the bowel preparation was                            good. The terminal ileum, ileocecal valve,                            appendiceal orifice, and rectum were photographed. Scope In: 7:50:25 AM Scope Out: 8:08:42 AM Scope Withdrawal Time: 0 hours 15 minutes 6 seconds  Total Procedure Duration: 0 hours 18 minutes 17 seconds  Findings:      The digital rectal exam findings include hemorrhoids. Pertinent       negatives include no palpable rectal lesions.      The terminal ileum and ileocecal valve appeared normal.      A 5 mm polyp was found in the transverse colon. The polyp was sessile.  The polyp was removed with a cold snare. Resection and retrieval were       complete.      A large post mucosectomy scar was found in the mid transverse colon with       tattoo present in the region and under the scar. There was very minimal       residual polypoid tissue - query clip artifact vs small recurrence. This       was all sampled/removed with a cold snare for histology purposes to see       if any recurrence is present.      Multiple small-mouthed diverticula were found in the recto-sigmoid       colon, sigmoid colon and transverse colon.      Normal mucosa was found in the entire colon otherwise.      Non-bleeding non-thrombosed external and  internal hemorrhoids were found       during retroflexion, during perianal exam and during digital exam. The       hemorrhoids were Grade III (internal hemorrhoids that prolapse but       require manual reduction). Impression:               - Hemorrhoids found on digital rectal exam.                           - The examined portion of the ileum was normal.                           - One 5 mm polyp in the transverse colon, removed                            with a cold snare. Resected and retrieved.                           - Post mucosectomy scar in the mid transverse                            colon. Polypoid tissue noted in region but unclear                            that it is recurrence v clip artifact - this was                            all been removed/sampled off.                           - Diverticulosis in the recto-sigmoid colon, in the                            sigmoid colon and in the transverse colon.                           - Normal mucosa in the entire examined colon                            otherwise.                           -  Non-bleeding non-thrombosed external and internal                            hemorrhoids. Moderate Sedation:      Not Applicable - Patient had care per Anesthesia. Recommendation:           - The patient will be observed post-procedure,                            until all discharge criteria are met.                           - Discharge patient to home.                           - High fiber diet.                           - Use FiberCon 1-2 tablets PO daily.                           - Continue present medications.                           - Await pathology results.                           - Repeat colonoscopy for surveillance based on                            pathology results. If there is no evidence of                            recurrence at the site of large polyp resection                            then will plan to  follow up no sooner than 3-years.                            She will be 83 at that time, so we would want to                            discuss follow up with her in clinic vs stopping                            procedures based on her health. If there is                            evidence of recurrence, recommend a 1-year follow                            up Colonoscopy.                           - The findings and recommendations were discussed  with the patient. Procedure Code(s):        --- Professional ---                           [phone], Colonoscopy, flexible; with removal of                            tumor(s), polyp(s), or other lesion(s) by snare                            technique Diagnosis Code(s):        --- Professional ---                           Z86.010, Personal history of colonic polyps                           K64.2, Third degree hemorrhoids                           K63.5, Polyp of colon                           Z98.890, Other specified postprocedural states                           K57.30, Diverticulosis of large intestine without                            perforation or abscess without bleeding CPT copyright 2019 American Medical Association. All rights reserved. The codes documented in this report are preliminary and upon coder review may  be revised to meet current compliance requirements. Justice Britain, MD 08/31/2020 8:23:41 AM Number of Addenda: 0

## 2020-09-02 ENCOUNTER — Encounter: Payer: Self-pay | Admitting: Gastroenterology

## 2020-09-02 LAB — SURGICAL PATHOLOGY

## 2020-09-09 ENCOUNTER — Telehealth: Payer: Self-pay | Admitting: Pharmacist

## 2020-09-09 ENCOUNTER — Other Ambulatory Visit: Payer: Self-pay | Admitting: Dermatology

## 2020-09-09 NOTE — Chronic Care Management (AMB) (Addendum)
Chronic Care Management Pharmacy Assistant   Name: Joan Mclaughlin  MRN: [id number] DOB: 02-24-1940   Reason for Encounter: General Adherence Call    Recent office visits:  None  Recent consult visits:  08/16/2020 OV (derm) Robyne Askew R, PA-C; no medication changes indicated.  Hospital visits:  08/31/2020 Surgery, Colonoscopy, Mansouraty, Telford Nab., MD  Medications: Outpatient Encounter Medications as of 09/09/2020  Medication Sig   ALPRAZolam (XANAX) 1 MG tablet Take 0.5 tablets (0.5 mg total) by mouth at bedtime.   ALPRAZolam (XANAX) 1 MG tablet TAKE 1/2 TABLET BY MOUTH EVERY DAY AT BEDTIME   amLODipine (NORVASC) 5 MG tablet Take 0.5 tablets (2.5 mg total) by mouth daily. (Patient taking differently: Take 5 mg by mouth daily.)   Ascorbic Acid (VITAMIN C) 1000 MG tablet Take 1,000 mg by mouth daily.   b complex vitamins capsule Take 1 capsule by mouth daily.   CALCIUM-MAGNESIUM PO Take 1 tablet by mouth daily. 1000 mg / 500 mg   Cholecalciferol (VITAMIN D3) 50 MCG (2000 UT) capsule Take 2,000 Units by mouth daily.   Ginger, Zingiber officinalis, (GINGER PO) Take 1,000 mg by mouth daily. Ginger Root   hydrocortisone 2.5 % cream Apply topically 2 (two) times daily as needed (Rash). (Patient taking differently: Apply 1 application topically 2 (two) times daily as needed (Rosacea).)   hydrOXYzine (ATARAX/VISTARIL) 10 MG tablet TAKE 1 TO 2 TABLETS BY MOUTH AT BEDTIME   ketoconazole (NIZORAL) 2 % shampoo Apply 1 application topically daily as needed for itching.   Lysine 500 MG TABS Take 500 mg by mouth daily.   metroNIDAZOLE (METROCREAM) 0.75 % cream Apply topically in the morning and at bedtime.   Multiple Vitamin (MULTIVITAMIN WITH MINERALS) TABS tablet Take 1 tablet by mouth daily.   Multiple Vitamins-Minerals (ZINC PO) Take 90 mg by mouth daily.   Omega-3 Fatty Acids (FISH OIL PO) Take 630 mg by mouth daily.   Oxcarbazepine (TRILEPTAL) 300 MG tablet Take 1 tablet  ('300mg'$ ) by mouth twice daily   Potassium 99 MG TABS Take 99 mg by mouth daily.   Probiotic Product (PROBIOTIC DAILY) CAPS Take 1 capsule by mouth daily. Woman   Red Yeast Rice 600 MG CAPS Take 1,200 mg by mouth at bedtime. With Co-Q-10 100 mg   selenium 200 MCG TABS tablet Take 200 mcg by mouth daily.   TURMERIC PO Take 600 mg by mouth daily.   valACYclovir (VALTREX) 500 MG tablet TAKE 1 TABLET (500 MG TOTAL) BY MOUTH DAILY AS NEEDED (COLD SORES).   VITAMIN A PO Take 7,500 mcg by mouth daily.   No facility-administered encounter medications on file as of 09/09/2020.   Patient Questions: Have you had any problems recently with your health? Patient states she has not had any problems recently with her health.  Have you had any problems with your pharmacy? Patient states she has not had any problems recently with her pharmacy.  What issues or side effects are you having with your medications? Patient state she is not currently having any issues or side effects with any of her medications.  What would you like me to pass along to Leata Mouse, CPP for him to help you with?  Patient states she does not have anything to pass along at this time.  What can we do to take care of you better? Patient did not have any suggestions.  Care Gaps: Medicare Annual Wellness: Completed Zoster Vaccines- Shingrix: Completed COVID-19 Vaccination: Postponed until 01/06/2021 PNA  Vaccination:  Completed Influenza Vaccination: Overdue since 08/08/2020 Tetanus/DTAP: Discontinued Dexa Scan: Discontinued HPV Vaccines: Aged Out  Patient states she will discuss having influenza vaccine at her next office visit with her pcp.   Future Appointments  Date Time Provider [place]  01/10/2021  3:30 PM [facility] CCM PHARMACIST [facility]  01/18/2021 11:00 AM Vivi Barrack, MD [facility]  02/27/2021  4:00 PM Cameron Sprang, MD [facility] None  07/06/2021  9:30 AM [facility] HEALTH COACH [facility]    Star  Rating Drugs: None  April D Calhoun, [place] Pharmacist Assistant [phone]

## 2020-11-09 ENCOUNTER — Ambulatory Visit: Payer: Medicare Other | Admitting: Neurology

## 2020-11-22 ENCOUNTER — Other Ambulatory Visit: Payer: Self-pay

## 2020-11-22 ENCOUNTER — Encounter: Payer: Self-pay | Admitting: Neurology

## 2020-11-22 ENCOUNTER — Ambulatory Visit: Payer: Medicare Other | Admitting: Neurology

## 2020-11-22 DIAGNOSIS — G40009 Localization-related (focal) (partial) idiopathic epilepsy and epileptic syndromes with seizures of localized onset, not intractable, without status epilepticus: Secondary | ICD-10-CM

## 2020-11-22 MED ORDER — OXCARBAZEPINE 300 MG PO TABS: ORAL_TABLET | ORAL | 3 refills | Status: DC

## 2020-11-22 NOTE — Patient Instructions (Signed)
Always good to see you. Continue oxcarbazepine 300mg  twice a day. Follow-up in 1 year, call for any changes.   Seizure Precautions: 1. If medication has been prescribed for you to prevent seizures, take it exactly as directed.  Do not stop taking the medicine without talking to your doctor first, even if you have not had a seizure in a long time.   2. Avoid activities in which a seizure would cause danger to yourself or to others.  Don't operate dangerous machinery, swim alone, or climb in high or dangerous places, such as on ladders, roofs, or girders.  Do not drive unless your doctor says you may.  3. If you have any warning that you may have a seizure, lay down in a safe place where you can't hurt yourself.    4.  No driving for 6 months from last seizure, as per [facility].   Please refer to the following link on the [place] website for more information: http://[email or web address].org/[email or web address]/driving/drivingu.cfm   5.  Maintain good sleep hygiene. Avoid alcohol.  6.  Contact your doctor if you have any problems that may be related to the medicine you are taking.  7.  Call 911 and bring the patient back to the ED if:        A.  The seizure lasts longer than 5 minutes.       B.  The patient doesn't awaken shortly after the seizure  C.  The patient has new problems such as difficulty seeing, speaking or moving  D.  The patient was injured during the seizure  E.  The patient has a temperature over 102 F (39C)  F.  The patient vomited and now is having trouble breathing

## 2020-11-22 NOTE — Progress Notes (Signed)
NEUROLOGY FOLLOW UP OFFICE NOTE  Joan Mclaughlin [id number] [birth year]  HISTORY OF PRESENT ILLNESS: I had the pleasure of seeing Joan Mclaughlin in follow-up in the neurology clinic on 11/22/2020.  The patient was last seen 9 months ago for seizures. She continues to do well seizure-free since July 2013 on oxcarbazepine 300mg  BID without side effects. She denies any staring/unresponsive episodes, gaps in time, olfactory/gustatory hallucinations, focal numbness/tingling/weakness, myoclonic jerks. She denies any significant headaches. Once in a while she feels she has to put her head down when she is standing, this has occurred a couple of times this year and she wonders if it is due to her BP. She denies any recent falls. Mood is good. She takes 1/2 tablet of Xanax and hydroxyzine which helps her sleep, but she has difficulty with sleep maintenance. She always wakes up at 7am and does not nap.   History on Initial Assessment 05/01/2013: This is a 80 yo RH woman with a history of seizures diagnosed in 2008. She was in [place] and was sleep deprived, started feeling strange, "like looking from the outside in," then fell down with a generalized convulsion. She was living in [place] and had seen a neurologist, started on Trileptal, which she had been taking for several years until she self-reduced dose by half. She had been taking this lower dose for 2 years with no seizures until July 2013. At that time, she had been taking lorazepam 1mg  qhs for several years and did not take it the night prior, instead took a different over the counter sleep aid. She woke up with blood on her face and bowel incontinence, then apparently had another seizure that was witnessed by her son with foaming at the mouth. She was started on Keppra but felt weird on this and was put back on Trileptal 300mg  BID since then, with no further seizures.   She feels she may have had seizures as a teenager, she recalls babysitting then  falling back and losing consciousness. She "always fainted" in church. In college, she was drinking the night before and recalls looking in the mirror, then found herself on the floor. She denies any gaps in time, no staring/unresponsive episodes, olfactory/gustatory hallucinations, focal numbness/tingling/weakness, myoclonic jerks. She has never been a good sleeper, and has tried all the over the counter medications with no effect. She has tried Ambien (felt hungover), Lunesta and Melatonin gave her weird nightmarish thoughts. She has been taking lorazepam nightly for at least 3 or 4 years, and this is the only medication that helps her without feeling hungover the next day.   Epilepsy Risk Factors: Her brother has seizures. Another brother may have seizures also, he takes phenobarbital. Her nephew may have seizures as well. Otherwise she had a normal birth and early development. There is no history of febrile convulsions, CNS infections such as meningitis/encephalitis, significant traumatic brain injury, neurosurgical procedures.  Diagnostic Data: Routine EEG by Dr. Sonny Masters 07/2005: Left posterior frontal sharp wave activity with phase reversal at F7. Left frontal sharp and slow activity. Abnormal EEG with posterior frontal low activity compatible with seizure disorder with left frontal focus.   Routine EEG by Dr. Sonny Masters 06/2007: Spike activity with phase reversal noted at [facility]. Hyperventilation denoted spike activity. Impression: Left frontal region irritative lesion compatible with underlying seizures.   Routine EEG by Dr. Sonny Masters 05/2009: Left temporal spike activity noted with phase reversal T3. Impression: Abnormal due to irritative lesion left temporal region compatible with underlying seizure disorder.  MRI brain 04/2006: There are scattered punctate foci of signal abnormality in the cerebral white matter bilaterally. These findings are consistent with mild microvascular ischemia.   Per Dr. Prentice Docker  note: "Reviewed MRI head, MRI brain from March 28, 2006 demonstrates atrophy in the medial temporal lobes, more prominent on the right." She had a seizure in July 2013 felt to be provoked by diphenhydramine and low dose Trileptal   PAST MEDICAL HISTORY: Past Medical History:  Diagnosis Date   Alcoholism ([place])    sober for 86 years-noted 09/16/2018   Allergy    Anxiety    Cancer ([place])    skin cancer   Cholecystitis    Complication of anesthesia    Difficulty sleeping    takes med to sleep   Diverticulitis    Family history of adverse reaction to anesthesia    SIster - N/V   Gallstones    Ganglion cyst 12/11/2016   GERD (gastroesophageal reflux disease)    "RELATED TO GALLBLADDER"   Heart murmur    "ONLY DETECTED WHEN LYING DOWN"  - 10/16/2018- Not concerned with   History of Lyme disease 09/19/2016   Hyperlipidemia    Hypertension    Lyme disease    Pt states she has had Lyme Disease twice.    Nocturia    Pneumonia    PONV (postoperative nausea and vomiting)    Nausea   S/P laparoscopic cholecystectomy 12/23/2014   Seizure disorder ([place])    Seizures ([place])    Seizures ([place])    "last one 10- 15 years ago - 10/16/2018   Squamous cell carcinoma of skin 12/25/2018   RIGHT FOREARM    Squamous cell carcinoma of skin 09/30/2019   in situ-left lower leg-anterior   Trimalleolar fracture    left   Tubular adenoma of colon 06/27/2018    MEDICATIONS: Current Outpatient Medications on File Prior to Visit  Medication Sig Dispense Refill   ALPRAZolam (XANAX) 1 MG tablet Take 0.5 tablets (0.5 mg total) by mouth at bedtime. 30 tablet 0   amLODipine (NORVASC) 5 MG tablet Take 0.5 tablets (2.5 mg total) by mouth daily. (Patient taking differently: Take 5 mg by mouth daily.) 90 tablet 3   Ascorbic Acid (VITAMIN C) 1000 MG tablet Take 1,000 mg by mouth daily.     b complex vitamins capsule Take 1 capsule by mouth daily.     CALCIUM-MAGNESIUM PO Take 1 tablet by mouth daily. 1000 mg / 500 mg      Cholecalciferol (VITAMIN D3) 50 MCG (2000 UT) capsule Take 2,000 Units by mouth daily.     hydrocortisone 2.5 % cream Apply topically 2 (two) times daily as needed (Rash). (Patient taking differently: Apply 1 application topically 2 (two) times daily as needed (Rosacea).) 30 g 11   hydrOXYzine (ATARAX/VISTARIL) 10 MG tablet TAKE 1 TO 2 TABLETS BY MOUTH AT BEDTIME 180 tablet 2   ketoconazole (NIZORAL) 2 % shampoo Apply 1 application topically daily as needed for itching.     Lysine 500 MG TABS Take 500 mg by mouth daily.     Multiple Vitamin (MULTIVITAMIN WITH MINERALS) TABS tablet Take 1 tablet by mouth daily.     Multiple Vitamins-Minerals (ZINC PO) Take 90 mg by mouth daily.     Omega-3 Fatty Acids (FISH OIL PO) Take 630 mg by mouth daily.     Oxcarbazepine (TRILEPTAL) 300 MG tablet Take 1 tablet (300mg ) by mouth twice daily 180 tablet 3   Potassium 99 MG TABS Take  99 mg by mouth daily.     Probiotic Product (PROBIOTIC DAILY) CAPS Take 1 capsule by mouth daily. Woman     Red Yeast Rice 600 MG CAPS Take 1,200 mg by mouth at bedtime. With Co-Q-10 100 mg     selenium 200 MCG TABS tablet Take 200 mcg by mouth daily.     TURMERIC PO Take 600 mg by mouth daily.     valACYclovir (VALTREX) 500 MG tablet TAKE 1 TABLET (500 MG TOTAL) BY MOUTH DAILY AS NEEDED (COLD SORES). 30 tablet 0   ALPRAZolam (XANAX) 1 MG tablet TAKE 1/2 TABLET BY MOUTH EVERY DAY AT BEDTIME 30 tablet 0   No current facility-administered medications on file prior to visit.    ALLERGIES: Allergies  Allergen Reactions   Amoxicillin-Pot Clavulanate Nausea And Vomiting    Patient denies knowledge of this allergy  Did it involve swelling of the face/tongue/throat, SOB, or low BP? No Did it involve sudden or severe rash/hives, skin peeling, or any reaction on the inside of your mouth or nose? No Did you need to seek medical attention at a hospital or doctor's office? No When did it last happen?      in 2016 If all above  answers are "NO", may proceed with cephalosporin use.    Suvorexant Other (See Comments)    Nightmares     FAMILY HISTORY: Family History  Problem Relation Age of Onset   Arthritis Father    Alcohol abuse Mother        drinker and smoker   Diabetes Mother    Heart disease Mother    Hypertension Mother    Stroke Mother    Depression Sister    Hearing loss Sister    Hyperlipidemia Sister    Alcohol abuse Daughter    Alcohol abuse Son    Depression Sister    Early death Sister    Alcohol abuse Brother    Depression Sister    Heart disease Sister    Colon cancer Neg Hx    Esophageal cancer Neg Hx    Inflammatory bowel disease Neg Hx    Liver disease Neg Hx    Pancreatic cancer Neg Hx    Rectal cancer Neg Hx    Stomach cancer Neg Hx     SOCIAL HISTORY: Social History   Socioeconomic History   Marital status: Single    Spouse name: Not on file   Number of children: 4   Years of education: 14   Highest education level: Not on file  Occupational History   Occupation: retired    Comment: Equities trader payable and nanny   Tobacco Use   Smoking status: Never   Smokeless tobacco: Never  Vaping Use   Vaping Use: Never used  Substance and Sexual Activity   Alcohol use: No    Alcohol/week: 0.0 standard drinks    Comment: sober for 72 years-noted 09/16/2018   Drug use: No   Sexual activity: Not Currently  Other Topics Concern   Not on file  Social History Narrative   Patient lives alone.   patient has 4 grandchildren.   Patient is retired   Patient has 2 years of college.   Patient is right handed.         Social Determinants of Health   Financial Resource Strain: Low Risk    Difficulty of Paying Living Expenses: Not hard at all  Food Insecurity: No Food Insecurity   Worried About Charity fundraiser in  the Last Year: Never true   Ran Out of Food in the Last Year: Never true  Transportation Needs: No Transportation Needs   Lack of Transportation (Medical): No    Lack of Transportation (Non-Medical): No  Physical Activity: Insufficiently Active   Days of Exercise per Week: 2 days   Minutes of Exercise per Session: 60 min  Stress: No Stress Concern Present   Feeling of Stress : Not at all  Social Connections: Unknown   Frequency of Communication with Friends and Family: Three times a week   Frequency of Social Gatherings with Friends and Family: Three times a week   Attends Religious Services: More than 4 times per year   Active Member of Clubs or Organizations: Yes   Attends Archivist Meetings: 1 to 4 times per year   Marital Status: Not on file  Intimate Partner Violence: Not At Risk   Fear of Current or Ex-Partner: No   Emotionally Abused: No   Physically Abused: No   Sexually Abused: No     PHYSICAL EXAM: Vitals:   11/22/20 1431  BP: (!) 160/79  Pulse: 80  SpO2: 98%   General: No acute distress Head:  Normocephalic/atraumatic Skin/Extremities: No rash, no edema Neurological Exam: alert and awake. No aphasia or dysarthria. Fund of knowledge is appropriate. Attention and concentration are normal.   Cranial nerves: Pupils equal, round. Extraocular movements intact with no nystagmus. Visual fields full.  No facial asymmetry.  Motor: Bulk and tone normal, muscle strength 5/5 throughout with no pronator drift.   Finger to nose testing intact.  Gait narrow-based and steady, no ataxia. Romberg negative.   IMPRESSION: This is an 80 yo RH woman with a history of episodes of loss of consciousness as a teenager, and 2 witnessed convulsions in 2008 and 2013. The last seizure occurred in the setting of reduced Trileptal dose and not taking lorazepam the night prior. Prior EEGs have shown left frontotemporal sharp waves, suggestive of left temporal lobe epilepsy. She has been seizure-free since 2013 on oxcarbazepine 300mg  BID, refills sent. She is aware of [place] driving laws to stop driving after a seizure until 6 months seizure-free. Follow-up  in 1 year, call for any changes.    Thank you for allowing me to participate in her care.  Please do not hesitate to call for any questions or concerns.   Ellouise Newer, M.D.   CC: Dr. [name]

## 2020-12-03 DIAGNOSIS — I10 Essential (primary) hypertension: Secondary | ICD-10-CM | POA: Diagnosis not present

## 2020-12-03 DIAGNOSIS — L0390 Cellulitis, unspecified: Secondary | ICD-10-CM | POA: Diagnosis not present

## 2020-12-03 DIAGNOSIS — Z6824 Body mass index (BMI) 24.0-24.9, adult: Secondary | ICD-10-CM | POA: Diagnosis not present

## 2020-12-07 ENCOUNTER — Ambulatory Visit (INDEPENDENT_AMBULATORY_CARE_PROVIDER_SITE_OTHER): Payer: Medicare Other | Admitting: Family

## 2020-12-07 ENCOUNTER — Encounter: Payer: Self-pay | Admitting: Family

## 2020-12-07 ENCOUNTER — Other Ambulatory Visit: Payer: Self-pay

## 2020-12-07 VITALS — BP 138/69 | HR 73 | Temp 98.3°F | Ht 64.0 in | Wt 132.2 lb

## 2020-12-07 DIAGNOSIS — G4700 Insomnia, unspecified: Secondary | ICD-10-CM

## 2020-12-07 MED ORDER — ZOLPIDEM TARTRATE ER 6.25 MG PO TBCR: 6.2500 mg | EXTENDED_RELEASE_TABLET | Freq: Every evening | ORAL | 0 refills | Status: DC | PRN

## 2020-12-07 NOTE — Assessment & Plan Note (Addendum)
Reports falling asleep fine with Xanax & Hydroxyzine, but can't maintain sleep. Pt reports waking up to urinate, she stopped drinking fluids about 1 hour prior to bedtime, advised she stop at least 3-4 hours. Starting low dose Ambien CR, advised to NOT take the Xanax or Hydroxyzine when first trying. Also recommend she wait until her son is here with her overnight (in 2 days) in case of possible side effects.  If not falling asleep easily, and not groggy in the morning, ok to try 1 tab of Hydroxyzine with the Ambien. Advised on use & SE.

## 2020-12-07 NOTE — Patient Instructions (Signed)
It was very nice to see you today!  As discussed, I have sent a low dose of generic Ambien to your pharmacy. DO NOT take your Xanax (Alprazolam) or Hydroxyzine with this to start. If having trouble falling asleep after starting, try 1 tab of Hydroxyzine first with the Ambien and see how this works.  Remember to STOP all water, fluid intake at least 3-4 hours prior to bedtime to avoid awakening to urinate.   PLEASE NOTE:  If you had any lab tests please let us know if you have not heard back within a few days. You may see your results on MyChart before we have a chance to review them but we will give you a call once they are reviewed by [place]. If we ordered any referrals today, please let us know if you have not heard from their office within the next week.   Please try these tips to maintain a healthy lifestyle:  Eat most of your calories during the day when you are active. Eliminate processed foods including packaged sweets (pies, cakes, cookies), reduce intake of potatoes, white bread, white pasta, and white rice. Look for whole grain options, oat flour or almond flour.  Each meal should contain half fruits/vegetables, one quarter protein, and one quarter carbs (no bigger than a computer mouse).  Cut down on sweet beverages. This includes juice, soda, and sweet tea. Also watch fruit intake, though this is a healthier sweet option, it still contains natural sugar! Limit to 3 servings daily.  Drink at least 1 glass of water with each meal and aim for at least 8 glasses per day  Exercise at least 150 minutes every week.

## 2020-12-07 NOTE — Progress Notes (Signed)
Subjective:     Patient ID: Boots Mcglown, female    DOB: [birth year], 80 y.o.   MRN: [id number]  Chief Complaint  Patient presents with   Discuss sleep    Up every 2 or 3 hours all night long, tried some meds a couple of years ago that didn't work Would like to get a sample of something to try first     HPI INSOMNIA:  How long: years. Difficulty initiating sleep: no.  Difficulty maintaining sleep: yes.  OTC meds tried: Melatonin. RX meds in past: Belsomra, Xanax, Vistaril. Sleep hygiene measures: yes, but does drink water about 1 hour prior to bedtime and awakes to urinate. Started any new meds recently: no. Shift worker: no. New stressors: denies.    Past Medical History:  Diagnosis Date   Alcoholism ([place])    sober for 19 years-noted 09/16/2018   Allergy    Anxiety    Cancer ([place])    skin cancer   Cholecystitis    Complication of anesthesia    Difficulty sleeping    takes med to sleep   Diverticulitis    Family history of adverse reaction to anesthesia    SIster - N/V   Gallstones    Ganglion cyst 12/11/2016   GERD (gastroesophageal reflux disease)    "RELATED TO GALLBLADDER"   Heart murmur    "ONLY DETECTED WHEN LYING DOWN"  - 10/16/2018- Not concerned with   History of Lyme disease 09/19/2016   Hyperlipidemia    Hypertension    Lyme disease    Pt states she has had Lyme Disease twice.    Nocturia    Pneumonia    PONV (postoperative nausea and vomiting)    Nausea   S/P laparoscopic cholecystectomy 12/23/2014   Seizure disorder ([place])    Seizures ([place])    Seizures ([place])    "last one 10- 15 years ago - 10/16/2018   Squamous cell carcinoma of skin 12/25/2018   RIGHT FOREARM    Squamous cell carcinoma of skin 09/30/2019   in situ-left lower leg-anterior   Trimalleolar fracture    left   Tubular adenoma of colon 06/27/2018    Past Surgical History:  Procedure Laterality Date   CHOLECYSTECTOMY N/A 12/23/2014   Procedure: LAPAROSCOPIC CHOLECYSTECTOMY WITH  INTRAOPERATIVE CHOLANGIOGRAM;  Surgeon: Greer Pickerel, MD;  Location: [facility];  Service: General;  Laterality: N/A;   COLONOSCOPY     "3 times trying to get large polyp since August 2020"   COLONOSCOPY WITH PROPOFOL N/A 10/20/2018   Procedure: COLONOSCOPY WITH PROPOFOL;  Surgeon: Irving Copas., MD;  Location: [facility];  Service: Gastroenterology;  Laterality: N/A;   COLONOSCOPY WITH PROPOFOL N/A 11/02/2019   Procedure: COLONOSCOPY WITH PROPOFOL;  Surgeon: Rush Landmark Telford Nab., MD;  Location: [facility];  Service: Gastroenterology;  Laterality: N/A;   COLONOSCOPY WITH PROPOFOL N/A 08/31/2020   Procedure: COLONOSCOPY WITH PROPOFOL;  Surgeon: Rush Landmark Telford Nab., MD;  Location: [facility];  Service: Gastroenterology;  Laterality: N/A;   ENDOSCOPIC MUCOSAL RESECTION N/A 10/20/2018   Procedure: ENDOSCOPIC MUCOSAL RESECTION;  Surgeon: Rush Landmark Telford Nab., MD;  Location: [place] [facility];  Service: Gastroenterology;  Laterality: N/A;   ENDOSCOPIC MUCOSAL RESECTION  11/02/2019   Procedure: ENDOSCOPIC MUCOSAL RESECTION;  Surgeon: Rush Landmark Telford Nab., MD;  Location: [facility];  Service: Gastroenterology;;   HEMOSTASIS CLIP PLACEMENT  10/20/2018   Procedure: HEMOSTASIS CLIP PLACEMENT;  Surgeon: Irving Copas., MD;  Location: [place] [facility];  Service: Gastroenterology;;   HEMOSTASIS CLIP PLACEMENT  11/02/2019   Procedure: HEMOSTASIS CLIP PLACEMENT;  Surgeon: Irving Copas., MD;  Location: [facility];  Service: Gastroenterology;;   ORIF ANKLE FRACTURE Left 02/19/2019   Procedure: OPEN REDUCTION INTERNAL FIXATION (ORIF) LEFT ANKLE FRACTURE;  Surgeon: Erle Crocker, MD;  Location: [facility];  Service: Orthopedics;  Laterality: Left;   POLYPECTOMY  10/20/2018   Procedure: POLYPECTOMY;  Surgeon: Mansouraty, Telford Nab., MD;  Location: [facility];  Service: Gastroenterology;;   POLYPECTOMY  11/02/2019   Procedure: POLYPECTOMY;  Surgeon: Irving Copas.,  MD;  Location: [place];  Service: Gastroenterology;;   POLYPECTOMY  08/31/2020   Procedure: POLYPECTOMY;  Surgeon: Irving Copas., MD;  Location: [facility];  Service: Gastroenterology;;   [place] INJECTION  10/20/2018   Procedure: SUBMUCOSAL LIFTING INJECTION;  Surgeon: Irving Copas., MD;  Location: [place];  Service: Gastroenterology;;   SYNDESMOSIS REPAIR Left 02/19/2019   Procedure: SYNDESMOSIS REPAIR;  Surgeon: Erle Crocker, MD;  Location: [facility];  Service: Orthopedics;  Laterality: Left;   THORACIC DISCECTOMY  07/2007   T12   TUBAL LIGATION     WISDOM TOOTH EXTRACTION      Outpatient Medications Prior to Visit  Medication Sig Dispense Refill   ALPRAZolam (XANAX) 1 MG tablet TAKE 1/2 TABLET BY MOUTH EVERY DAY AT BEDTIME 30 tablet 0   amLODipine (NORVASC) 5 MG tablet Take 0.5 tablets (2.5 mg total) by mouth daily. (Patient taking differently: Take 5 mg by mouth daily.) 90 tablet 3   Ascorbic Acid (VITAMIN C) 1000 MG tablet Take 1,000 mg by mouth daily.     b complex vitamins capsule Take 1 capsule by mouth daily.     CALCIUM-MAGNESIUM PO Take 1 tablet by mouth daily. 1000 mg / 500 mg     cephALEXin (KEFLEX) 500 MG capsule Take 500 mg by mouth 2 (two) times daily.     Cholecalciferol (VITAMIN D3) 50 MCG (2000 UT) capsule Take 2,000 Units by mouth daily.     hydrocortisone 2.5 % cream Apply topically 2 (two) times daily as needed (Rash). (Patient taking differently: Apply 1 application topically 2 (two) times daily as needed (Rosacea).) 30 g 11   hydrOXYzine (ATARAX/VISTARIL) 10 MG tablet TAKE 1 TO 2 TABLETS BY MOUTH AT BEDTIME 180 tablet 2   ketoconazole (NIZORAL) 2 % shampoo Apply 1 application topically daily as needed for itching.     Lysine 500 MG TABS Take 500 mg by mouth daily.     Multiple Vitamin (MULTIVITAMIN WITH MINERALS) TABS tablet Take 1 tablet by mouth daily.     Multiple Vitamins-Minerals (ZINC PO) Take 90 mg by mouth daily.      Omega-3 Fatty Acids (FISH OIL PO) Take 630 mg by mouth daily.     Oxcarbazepine (TRILEPTAL) 300 MG tablet Take 1 tablet (300mg ) by mouth twice daily 180 tablet 3   Potassium 99 MG TABS Take 99 mg by mouth daily.     Probiotic Product (PROBIOTIC DAILY) CAPS Take 1 capsule by mouth daily. Woman     Red Yeast Rice 600 MG CAPS Take 1,200 mg by mouth at bedtime. With Co-Q-10 100 mg     selenium 200 MCG TABS tablet Take 200 mcg by mouth daily.     TURMERIC PO Take 600 mg by mouth daily.     valACYclovir (VALTREX) 500 MG tablet TAKE 1 TABLET (500 MG TOTAL) BY MOUTH DAILY AS NEEDED (COLD SORES). 30 tablet 0   ALPRAZolam (XANAX) 1 MG tablet Take 0.5 tablets (  0.5 mg total) by mouth at bedtime. 30 tablet 0   No facility-administered medications prior to visit.    Allergies  Allergen Reactions   Amoxicillin-Pot Clavulanate Nausea And Vomiting    Patient denies knowledge of this allergy  Did it involve swelling of the face/tongue/throat, SOB, or low BP? No Did it involve sudden or severe rash/hives, skin peeling, or any reaction on the inside of your mouth or nose? No Did you need to seek medical attention at a hospital or doctor's office? No When did it last happen?      in 2016 If all above answers are "NO", may proceed with cephalosporin use.    Suvorexant Other (See Comments)    Nightmares         Objective:    Physical Exam Vitals and nursing note reviewed.  Constitutional:      Appearance: Normal appearance.  Cardiovascular:     Rate and Rhythm: Normal rate and regular rhythm.  Pulmonary:     Effort: Pulmonary effort is normal.     Breath sounds: Normal breath sounds.  Musculoskeletal:        General: Normal range of motion.  Skin:    General: Skin is warm and dry.  Neurological:     Mental Status: She is alert and oriented to person, place, and time.  Psychiatric:        Mood and Affect: Mood normal.        Behavior: Behavior normal.    BP 138/69   Pulse 73   Temp  98.3 F (36.8 C) (Temporal)   Ht 5\' 4"  (1.626 m)   Wt 132 lb 4 oz (60 kg)   SpO2 96%   BMI 22.70 kg/m  Wt Readings from Last 3 Encounters:  12/07/20 132 lb 4 oz (60 kg)  11/22/20 136 lb 6.4 oz (61.9 kg)  08/31/20 136 lb 14.5 oz (62.1 kg)       Assessment & Plan:   Problem List Items Addressed This Visit       Other   Insomnia - Primary    Reports falling asleep fine with Xanax & Hydroxyzine, but can't maintain sleep. Pt reports waking up to urinate, she stopped drinking fluids about 1 hour prior to bedtime, advised she stop at least 3-4 hours. Starting low dose Ambien CR, advised to NOT take the Xanax or Hydroxyzine when first trying. Also recommend she wait until her son is here with her overnight (in 2 days) in case of possible side effects.  If not falling asleep easily, and not groggy in the morning, ok to try 1 tab of Hydroxyzine with the Ambien. Advised on use & SE.      Relevant Medications   zolpidem (AMBIEN CR) 6.25 MG CR tablet    Meds ordered this encounter  Medications   zolpidem (AMBIEN CR) 6.25 MG CR tablet    Sig: Take 1 tablet (6.25 mg total) by mouth at bedtime as needed for sleep.    Dispense:  30 tablet    Refill:  0    Order Specific Question:   Supervising Provider    Answer:   ANDY, CAMILLE L [[id number]]

## 2020-12-15 ENCOUNTER — Other Ambulatory Visit: Payer: Self-pay | Admitting: Family Medicine

## 2020-12-16 ENCOUNTER — Other Ambulatory Visit: Payer: Self-pay | Admitting: Family Medicine

## 2020-12-16 ENCOUNTER — Telehealth (INDEPENDENT_AMBULATORY_CARE_PROVIDER_SITE_OTHER): Payer: Medicare Other | Admitting: Family Medicine

## 2020-12-16 ENCOUNTER — Encounter: Payer: Self-pay | Admitting: Family Medicine

## 2020-12-16 VITALS — Ht 64.0 in | Wt 132.0 lb

## 2020-12-16 DIAGNOSIS — G4700 Insomnia, unspecified: Secondary | ICD-10-CM

## 2020-12-16 MED ORDER — MIRTAZAPINE 7.5 MG PO TABS: 7.5000 mg | ORAL_TABLET | Freq: Every day | ORAL | 0 refills | Status: DC

## 2020-12-16 NOTE — Assessment & Plan Note (Addendum)
Had lengthy discussion of treatment options with patient.  She has tried several medications which have not been active.  Hydroxyzine and trazodone.  She had some side effects with Ambien.  We will start Remeron.  Discussed potential side effects.  We will start low-dose.  She will check with me in a week or 2 and we can adjust the dose as needed.  If still not well controlled would consider trial of Lunesta.

## 2020-12-16 NOTE — Progress Notes (Signed)
   Nikiya Starn is a 80 y.o. female who presents today for a telephone visit.  Assessment/Plan:  Chronic Problems Addressed Today: Insomnia Had lengthy discussion of treatment options with patient.  She has tried several medications which have not been active.  Hydroxyzine and trazodone.  She had some side effects with Ambien.  We will start Remeron.  Discussed potential side effects.  We will start low-dose.  She will check with me in a week or 2 and we can adjust the dose as needed.  If still not well controlled would consider trial of Lunesta.    Subjective:  HPI:  Patient here for insomnia follow-up.  This is a longstanding issue.  She had a visit with different provider several days ago.  Was started on Ambien.  She tried this for a few days.  This caused some issues with nausea the next day.  She would like to try different medication.  She is no longer taking Ambien.       Objective/Observations   NAD  Telephone Visit   I connected with Kieryn Burtis on 12/16/20 at  3:40 PM EST via telephone and verified that I am speaking with the correct person using two identifiers. I discussed the limitations of evaluation and management by telemedicine and the availability of in person appointments. The patient expressed understanding and agreed to proceed.   Patient location: Home Provider location: [place] participating in the virtual visit: Myself and Patient  A total of 11 minutes were spent on medical discussion.      [name]. [name], MD 12/16/2020 3:56 PM

## 2021-01-10 ENCOUNTER — Ambulatory Visit (INDEPENDENT_AMBULATORY_CARE_PROVIDER_SITE_OTHER): Payer: Medicare Other | Admitting: Pharmacist

## 2021-01-10 ENCOUNTER — Other Ambulatory Visit: Payer: Self-pay | Admitting: Family Medicine

## 2021-01-10 DIAGNOSIS — G4700 Insomnia, unspecified: Secondary | ICD-10-CM

## 2021-01-10 DIAGNOSIS — I10 Essential (primary) hypertension: Secondary | ICD-10-CM

## 2021-01-10 NOTE — Patient Instructions (Addendum)
Visit Information   Goals Addressed             This Visit's Progress    get back to silver sneakers   On track      Patient Care Plan: [place] Plan     Problem Identified: HLD HTN Insomnia   Priority: High     Long-Range Goal: Disease Management   Start Date: 06/14/2020  Expected End Date: 06/14/2021  Recent Progress: On track  Priority: High  Note:     Current Barriers:  Unable to achieve control of insomnia   Pharmacist Clinical Goal(s):  Patient will contact provider office for questions/concerns as evidenced notation of same in electronic health record through collaboration with PharmD and provider.   Interventions: 1:1 collaboration with Vivi Barrack, MD regarding development and update of comprehensive plan of care as evidenced by provider attestation and co-signature Inter-disciplinary care team collaboration (see longitudinal plan of care) Comprehensive medication review performed; medication list updated in electronic medical record No Rx changes   Hypertension (BP goal <140/90) -Controlled -Current treatment: Amlodipine 2.5 mg once daily - Appropriate, Effective, Safe, Accessible -Current home readings: does not have cuff -Current dietary habits: oatmeal in AM, watches sugar. Does add some salt. -Current exercise habits: works out with Physiological scientist twice weekly. Plans to go to the local gym 3x/week through silver sneakers fitness benefit -Denies hypotensive/hypertensive symptoms -Educated on Daily salt intake goal < 2300 mg; -Counseled to monitor BP at home as directed, document, and provide log at future appointments -Recommended to continue current medication  Update 01/10/21 Patient is not checking her blood pressures at home. Most recent office visit her BP was normal. She does report that she has been feeling a little different lately. She denies dizziness, however, she has experienced some unsteadiness on her feet lately. She will  be in Monday - we will check BP at this OV. Also complains of some pain in her neck. Continue current medications.  Hyperlipidemia: (LDL goal < 130) -Not ideally controlled based on previous 02/2018 panel  -Is using RYR 1200 mg daily; omega 3 fatty acids 1000 mg daily  -Current treatment: No medications -Medications previously tried: none.  -Educated on Cholesterol goals;  -Recommended to continue current medication Will be due for lipids  Insomnia (Goal: minimize symptoms, optimize sleep hygiene) -Uncontrolled  -Some problem falling asleep and staying asleep, eats dinner at 7 pm. No alcohol. No caffeine past 2 pm.  -Current treatment  Hydroxyzine 10mg  hs Appropriate, Query effective -Medications previously tried: Belsomra (insomnia), Ambien, Mirtazapine  -Counseled on sleep hygiene. -Reviewed sleep habits in detail - avoid eating late, set room to comfortable temperature. Handout provided.  Update 01/10/21 She has stopped Mirtazapine after a few days because of the way it made her feel.  She has also tried and failed Ambien recently due to adverse effects. PCP had plans of future trial of Lunesta. No changes to meds at this time, patient plans to come in to see PCP early next week to discuss. I did recommend if she is feeling weird on melatonin that she could try to cut back on the dose to see if that helps.    Patient Goals/Self-Care Activities Patient will:  - take medications as prescribed target a minimum of 150 minutes of moderate intensity exercise weekly engage in dietary modifications by reducing salt intake to less than 2.3 grams/day  Follow Up Plan: [place] f/u call 6 months. CPA call next month to check in on sleep  Medication Assistance: None required.  Patient affirms current coverage meets needs.  Patient's preferred pharmacy is:  [id number] - [place], [place] [address] [place]  [address] Phone: [phone] Fax: [phone]  Follow Up:  Patient agrees to Care Plan and Follow-up.         Patient verbalizes understanding of instructions provided today and agrees to view in [place].  Telephone follow up appointment with pharmacy team member scheduled for: 6 months  Edythe Clarity, [place], PharmD Clinical Pharmacist  [facility] [phone]

## 2021-01-10 NOTE — Progress Notes (Signed)
Chronic Care Management Pharmacy Note  01/10/2021 Name:  Joan Mclaughlin MRN:  [id number] DOB:  [birth year]  Recommendations/Changes made from today's visit: No changes - patient experiencing some unsteadiness on feet.  Coming to see PCP Monday.  Subjective: Joan Mclaughlin is an 81 y.o. year old female who is a primary patient of [name], [name], MD.  The CCM team was consulted for assistance with disease management and care coordination needs.    Engaged with patient face to face for follow up visit in response to provider referral for pharmacy case management and/or care coordination services.   Consent to Services:  The patient was given the following information about Chronic Care Management services today, agreed to services, and gave verbal consent: 1. CCM service includes personalized support from designated clinical staff supervised by the primary care provider, including individualized plan of care and coordination with other care providers 2. 24/7 contact phone numbers for assistance for urgent and routine care needs. 3. Service will only be billed when office clinical staff spend 20 minutes or more in a month to coordinate care. 4. Only one practitioner may furnish and bill the service in a calendar month. 5.The patient may stop CCM services at any time (effective at the end of the month) by phone call to the office staff. 6. The patient will be responsible for cost sharing (co-pay) of up to 20% of the service fee (after annual deductible is met). Patient agreed to services and consent obtained.  Patient Care Team: Vivi Barrack, MD as PCP - General (Family Medicine) Marilynne Halsted, MD as Referring Physician (Ophthalmology) Laurence Spates, MD (Inactive) as Consulting Physician (Gastroenterology) Warren Danes, PA-C as Physician Assistant (Dermatology) Cameron Sprang, MD as Consulting Physician (Neurology) Edrick Kins, DPM as Consulting Physician  (Podiatry) Anselmo, [place], PA-C (Inactive) (Dermatology) Edythe Clarity, [facility] (Pharmacist)  Recent office visits:  12/16/20 [name]) - Ambien stopped due to adverse effects, started on Mirtazapine - plans to adjust dose if needed - future plans include Lunesta 12/07/20 ([place]) - Ambien given for sleep, do not take Xanax 05/24/20- Annye Asa, MD- seen for diverticulosis, no medication changes, follow up with PCP as needed 05/10/20- Dimas Chyle, MD (PCP)- seen for diverticulosis, short course ciprofloxacin 500 mg x 7 DS, short course metronidazole 500 mg x 7 DS, follow up as needed 04/27/20- Dimas Chyle, MD-  Seen for dizziness, no medication changes, no follow up documented 03/04/20- Dimas Chyle, MD- seen for UTI, decreased amlodipine from 5 mg daily to 2.5 mg daily, no follow up documented 12/15/19- Dimas Chyle, MD-( Video Visit)- seen for preventative health care, no medication changes, no follow up documented   Recent consult visits:  05/31/20- Alonza Bogus, PA-C Gertie Fey)- seen for follow up of Tubular adenoma of colon, colonoscopy scheduled, no medication changes, no follow up documented 04/07/20- Lavonna Monarch, MD (Dermatology)- seen for squamous cell carcinoma in situ, prescribed mupirocin ointment, follow up 6 months 02/26/20- Ellouise Newer, MD (Neurology)- seen for follow up of history of seizures, no medication changes, follow up 1 yr   Hospital visits:  None in previous 6 months Objective:  Lab Results  Component Value Date   CREATININE 0.58 05/24/2020   CREATININE 0.52 02/19/2019   CREATININE 0.60 09/25/2018    No results found for: HGBA1C Last diabetic Eye exam: No results found for: HMDIABEYEEXA  Last diabetic Foot exam: No results found for: HMDIABFOOTEX      Component Value Date/Time   CHOL 202 (  H) 03/05/2018 1011   TRIG 80.0 03/05/2018 1011   HDL 52.00 03/05/2018 1011   CHOLHDL 4 03/05/2018 1011   VLDL 16.0 03/05/2018 1011   LDLCALC 134  (H) 03/05/2018 1011    Hepatic Function Latest Ref Rng & Units 05/24/2020 07/29/2018 03/05/2018  Total Protein 6.0 - 8.3 g/dL 7.2 7.1 7.4  Albumin 3.5 - 5.2 g/dL 4.4 4.4 4.4  AST 0 - 37 U/L 23 19 19   ALT 0 - 35 U/L 23 18 16   Alk Phosphatase 39 - 117 U/L 62 74 75  Total Bilirubin 0.2 - 1.2 mg/dL 0.5 0.5 0.5  Bilirubin, Direct 0.0 - 0.3 mg/dL 0.1 - -    Lab Results  Component Value Date/Time   TSH 1.78 07/29/2018 11:18 AM   TSH 3.41 03/05/2018 10:11 AM    CBC Latest Ref Rng & Units 05/24/2020 02/19/2019 09/16/2018  WBC 4.0 - 10.5 K/uL 6.0 7.4 6.9  Hemoglobin 12.0 - 15.0 g/dL 14.4 13.3 14.7  Hematocrit 36.0 - 46.0 % 41.5 40.0 42.7  Platelets 150.0 - 400.0 K/uL 266.0 299 239.0    No results found for: VD25OH  Clinical ASCVD:  The ASCVD Risk score (Arnett DK, et al., 2019) failed to calculate for the following reasons:   The 2019 ASCVD risk score is only valid for ages 43 to 27    Other: (CHADS2VASc if Afib, PHQ9 if depression, MMRC or CAT for COPD, ACT, DEXA)  Social History   Tobacco Use  Smoking Status Never  Smokeless Tobacco Never   BP Readings from Last 3 Encounters:  12/07/20 138/69  11/22/20 (!) 160/79  08/31/20 (!) 146/54   Pulse Readings from Last 3 Encounters:  12/07/20 73  11/22/20 80  08/31/20 63   Wt Readings from Last 3 Encounters:  12/16/20 132 lb (59.9 kg)  12/07/20 132 lb 4 oz (60 kg)  11/22/20 136 lb 6.4 oz (61.9 kg)    Assessment: Review of patient past medical history, allergies, medications, health status, including review of consultants reports, laboratory and other test data, was performed as part of comprehensive evaluation and provision of chronic care management services.   SDOH:  (Social Determinants of Health) assessments and interventions performed:    CCM Care Plan  Allergies  Allergen Reactions   Amoxicillin-Pot Clavulanate Nausea And Vomiting    Patient denies knowledge of this allergy  Did it involve swelling of the  face/tongue/throat, SOB, or low BP? No Did it involve sudden or severe rash/hives, skin peeling, or any reaction on the inside of your mouth or nose? No Did you need to seek medical attention at a hospital or doctor's office? No When did it last happen?      in 2016 If all above answers are "NO", may proceed with cephalosporin use.    Suvorexant Other (See Comments)    Nightmares     Medications Reviewed Today     Reviewed by Edythe Clarity, [facility] (Pharmacist) on 01/10/21 at [place] List Status: <None>   Medication Order Taking? Sig Documenting Provider Last Dose Status Informant  ALPRAZolam (XANAX) 1 MG tablet [id number] Yes TAKE 1/2 TABLET BY MOUTH EVERY DAY AT BEDTIME Vivi Barrack, MD Taking Active   amLODipine (NORVASC) 5 MG tablet [id number] Yes TAKE 1 TABLET BY MOUTH EVERY DAY Vivi Barrack, MD Taking Active   Ascorbic Acid (VITAMIN C) 1000 MG tablet [id number] Yes Take 1,000 mg by mouth daily. [provider] Taking Active Self  b complex vitamins capsule [id number]  Yes Take 1 capsule by mouth daily. [provider] Taking Active Self  CALCIUM-MAGNESIUM PO [id number] Yes Take 1 tablet by mouth daily. 1000 mg / 500 mg [provider] Taking Active Self  Cholecalciferol (VITAMIN D3) 50 MCG (2000 UT) capsule [id number] Yes Take 2,000 Units by mouth daily. [provider] Taking Active Self  hydrocortisone 2.5 % cream [id number] Yes Apply topically 2 (two) times daily as needed (Rash).  Patient taking differently: Apply 1 application topically 2 (two) times daily as needed (Rosacea).   [place]ing, [place], PA-C Taking Active Self  hydrOXYzine (ATARAX/VISTARIL) 10 MG tablet [id number] Yes TAKE 1 TO 2 TABLETS BY MOUTH AT BEDTIME Lavonna Monarch, MD Taking Active   ketoconazole (NIZORAL) 2 % shampoo [id number] Yes Apply 1 application topically daily as needed for itching. [provider] Taking Active Self  Lysine 500 MG TABS [id number] Yes  Take 500 mg by mouth daily. [provider] Taking Active Self  mirtazapine (REMERON) 7.5 MG tablet [id number] No Take 1 tablet (7.5 mg total) by mouth at bedtime.  Patient not taking: Reported on 01/10/2021   Vivi Barrack, MD Not Taking Active   Multiple Vitamin (MULTIVITAMIN WITH MINERALS) TABS tablet [id number] Yes Take 1 tablet by mouth daily. [provider] Taking Active Self  Multiple Vitamins-Minerals (ZINC PO) [id number] Yes Take 90 mg by mouth daily. [provider] Taking Active Self  Omega-3 Fatty Acids (FISH OIL PO) [id number] Yes Take 630 mg by mouth daily. [provider] Taking Active Self  Oxcarbazepine (TRILEPTAL) 300 MG tablet [id number] Yes Take 1 tablet (386m) by mouth twice daily ACameron Sprang MD Taking Active   Potassium 99 MG TABS [id number] Take 99 mg by mouth daily. [provider] Taking Active Self  Probiotic Product (PROBIOTIC DAILY) CAPS [id number] Take 1 capsule by mouth daily. Woman [provider] Taking Active Self  Red Yeast Rice 600 MG CAPS [id number] Take 1,200 mg by mouth at bedtime. With Co-Q-10 100 mg [provider] Taking Active Self  selenium 200 MCG TABS tablet [id number] Take 200 mcg by mouth daily. [provider] Taking Active Self  TURMERIC PO [id number] Take 600 mg by mouth daily. [provider] Taking Active Self  valACYclovir (VALTREX) 500 MG tablet [id number] TAKE 1 TABLET (500 MG TOTAL) BY MOUTH DAILY AS NEEDED (COLD SORES). PVivi Barrack MD Taking Active Self  zolpidem (AMBIEN CR) 6.25 MG CR tablet [id number] Take 1 tablet (6.25 mg total) by mouth at bedtime as needed for sleep.  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   Essential hypertension 10/14/2018   Abnormal colonoscopy 09/18/2018   History of colonic polyps 09/16/2018   Tubular adenoma of colon 06/27/2018   Dyslipidemia 03/06/2018   Allergic rhinitis 03/05/2018   History of cold sores 03/05/2018   Dermatochalasis of both upper eyelids 01/12/2018   Constipation 01/14/2017   Localization-related idiopathic epilepsy and epileptic syndromes with seizures of localized onset, not intractable, without status epilepticus (HDunlap 03/02/2015   Insomnia 05/02/2013    Immunization History  Administered Date(s) Administered   Influenza-Unspecified 10/22/2017   Moderna Sars-Covid-2 Vaccination 12/16/2019, 01/13/2020   Pneumococcal Conjugate-13 05/16/2016   Pneumococcal Polysaccharide-23  06/25/2013   Tdap 10/22/2017   Zoster Recombinat (Shingrix) 03/24/2017, 07/23/2017   Conditions to be addressed/monitored: HLD HTN Insomnia   Care Plan : CCM Pharmacy Care Plan  Updates made by Edythe Clarity, RPH since 01/10/2021 12:00 AM     Problem: HLD HTN Insomnia   Priority: High     Long-Range Goal: Disease Management   Start Date: 06/14/2020  Expected End Date: 06/14/2021  Recent Progress: On track  Priority: High  Note:     Current Barriers:  Unable to achieve control of insomnia   Pharmacist Clinical Goal(s):  Patient will contact provider office for questions/concerns as evidenced notation of same in electronic health record through collaboration with PharmD and provider.   Interventions: 1:1 collaboration with Vivi Barrack, MD regarding development and update of comprehensive plan of care as evidenced by provider attestation and co-signature Inter-disciplinary care team collaboration (see longitudinal plan of care) Comprehensive medication review performed; medication list updated in electronic medical record No Rx changes   Hypertension (BP goal <140/90) -Controlled -Current treatment: Amlodipine 2.5 mg once daily -  Appropriate, Effective, Safe, Accessible -Current home readings: does not have cuff -Current dietary habits: oatmeal in AM, watches sugar. Does add some salt. -Current exercise habits: works out with Physiological scientist twice weekly. Plans to go to the local gym 3x/week through [employer] fitness benefit -Denies hypotensive/hypertensive symptoms -Educated on Daily salt intake goal < 2300 mg; -Counseled to monitor BP at home as directed, document, and provide log at future appointments -Recommended to continue current medication  Update 01/10/21 Patient is not checking her blood pressures at home. Most recent office visit her BP was normal. She does report that she has been feeling a little different lately. She denies dizziness, however, she has experienced some unsteadiness on her feet lately. She will be in Monday - we will check BP at this OV. Also complains of some pain in her neck. Continue current medications.  Hyperlipidemia: (LDL goal < 130) -Not ideally controlled based on previous 02/2018 panel  -Is using RYR 1200 mg daily; omega 3 fatty acids 1000 mg daily  -Current treatment: No medications -Medications previously tried: none.  -Educated on Cholesterol goals;  -Recommended to continue current medication Will be due for lipids  Insomnia (Goal: minimize symptoms, optimize sleep hygiene) -Uncontrolled  -Some problem falling asleep and staying asleep, eats dinner at 7 pm. No alcohol. No caffeine past 2 pm.  -Current treatment  Hydroxyzine 25m hs Appropriate, Query effective -Medications previously tried: Belsomra (insomnia), Ambien, Mirtazapine  -Counseled on sleep hygiene. -Reviewed sleep habits in detail - avoid eating late, set room to comfortable temperature. Handout provided.  Update 01/10/21 She has stopped Mirtazapine after a few days because of the way it made her feel.  She has also tried and failed Ambien recently due to adverse effects. PCP had plans of  future trial of Lunesta. No changes to meds at this time, patient plans to come in to see PCP early next week to discuss. I did recommend if she is feeling weird on melatonin that she could try to cut back on the dose to see if that helps.    Patient Goals/Self-Care Activities Patient will:  - take medications as prescribed target a minimum of 150 minutes of moderate intensity exercise weekly engage in dietary modifications by reducing salt intake to less than 2.3 grams/day  Follow Up Plan: RChickenf/u call 6 months. CPA call next month to check in on sleep Medication Assistance: None  required.  Patient affirms current coverage meets needs.  Patient's preferred pharmacy is:  [id number] [place], [place] - [address] [place]Phone: [phone]: [phone] Follow Up:  Patient agrees to Care Plan and Follow-up.        Future Appointments  Date Time Provider DCiales 01/16/2021 11:20 AM PVivi Barrack MD [facility] 07/06/2021  9:30 AM [facility] HEALTH COACH LLaughlin PharmD Clinical Pharmacist  [facility]([phone]

## 2021-01-11 IMAGING — CR DG ANKLE COMPLETE 3+V*L*
3 series · 3 of 3 positions shown · non-contrast
Comparison: None.

CLINICAL DATA: Fall on ice with left ankle deformity

EXAM:
LEFT ANKLE COMPLETE - 3+ VIEW

[ankle ap]
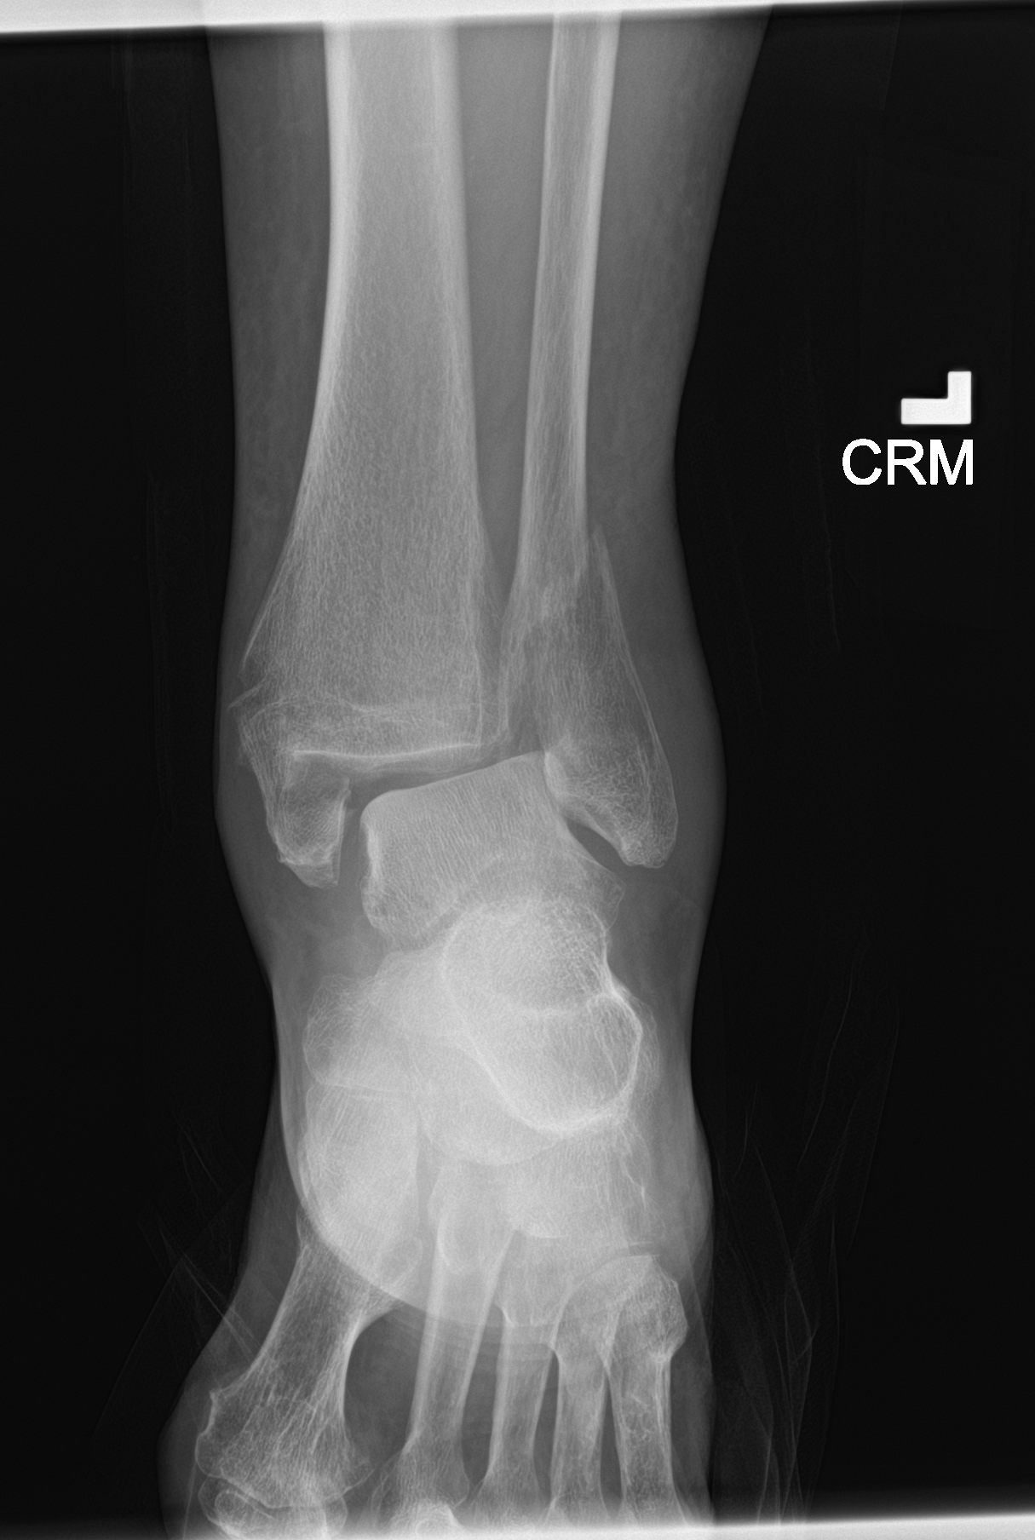

[ankle obl]
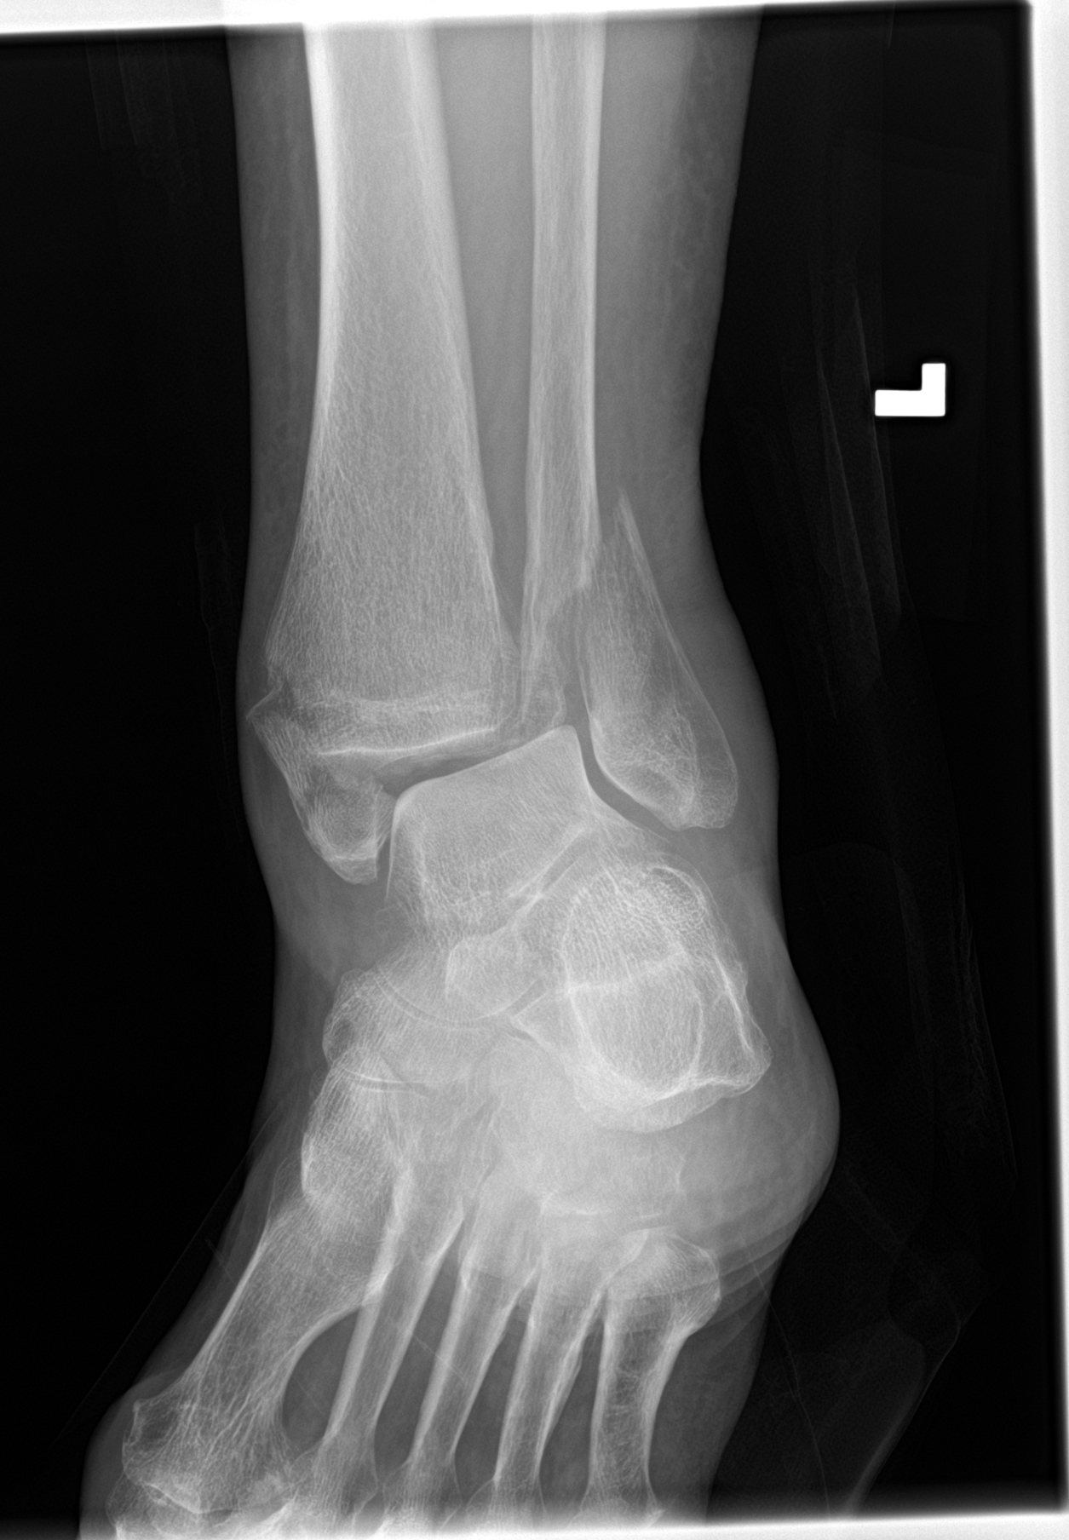

[ankle lat]
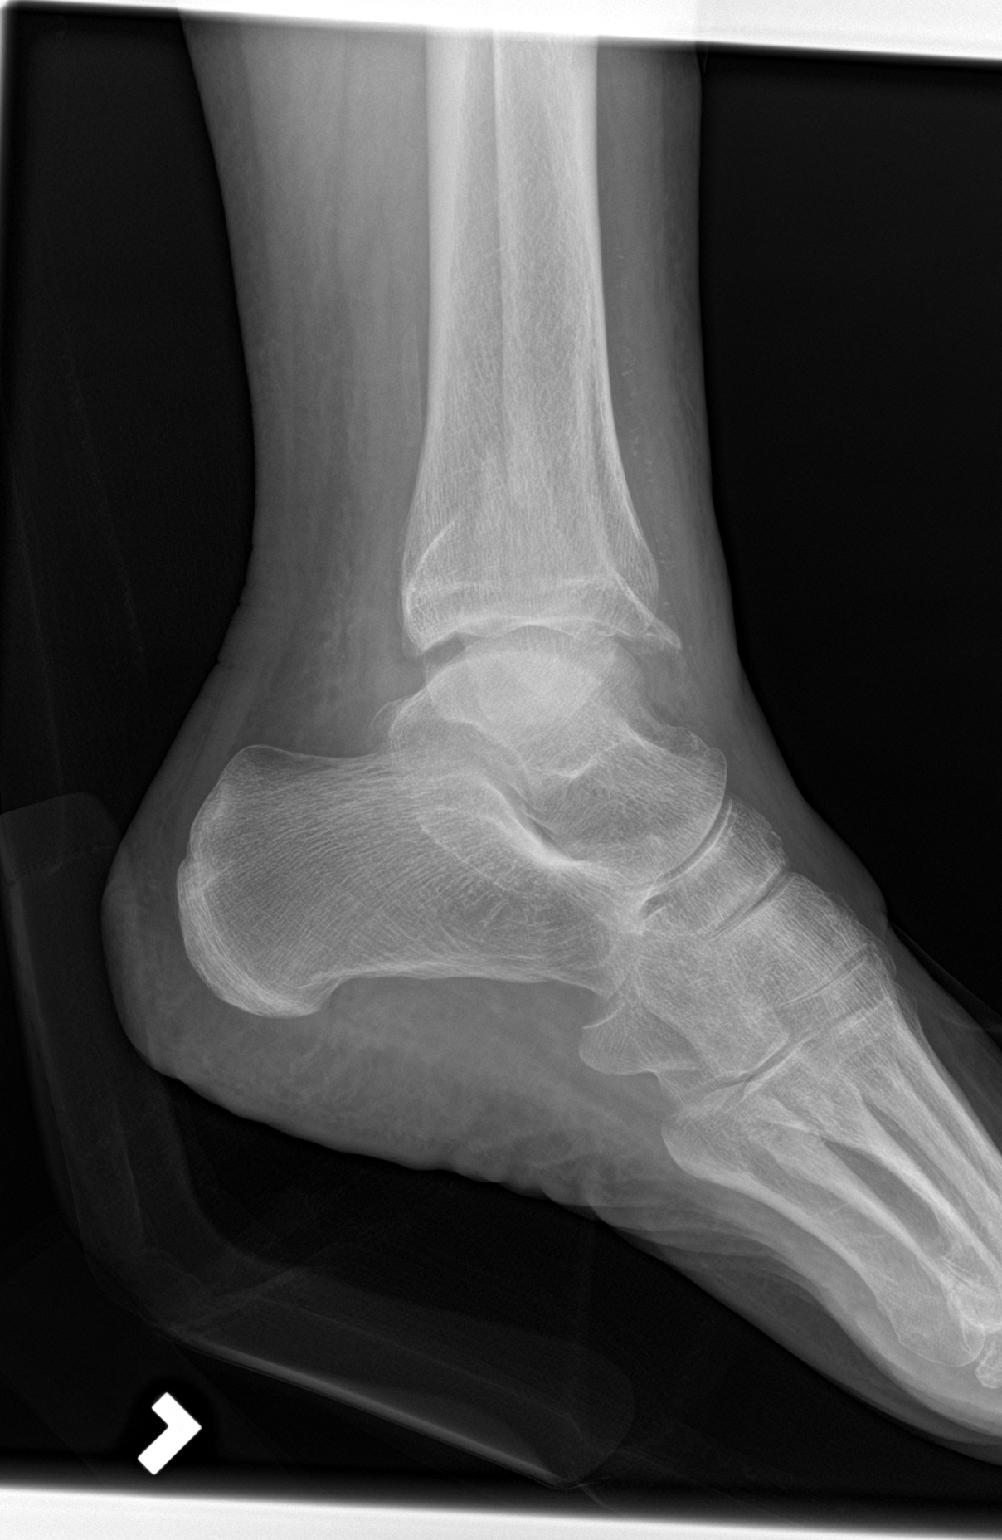

[3 of 3 positions shown; findings below may reference images not displayed]

FINDINGS: Distal fibular and medial malleolus fractures from eversion injury
with displacement causing malalignment due to lateral displacement
ankle mortise widening. Regional soft tissue swelling. Located
hindfoot.
IMPRESSION: Displaced distal fibular and medial malleolus fractures with
malaligned ankle

## 2021-01-11 IMAGING — DX DG ANKLE COMPLETE 3+V*L*
3 series · 3 of 3 positions shown · non-contrast
Comparison: [DATE] [DATE], [DATE] [DATE] a.m.

CLINICAL DATA: Status post reduction

EXAM:
LEFT ANKLE COMPLETE - 3+ VIEW

[ankle ap]
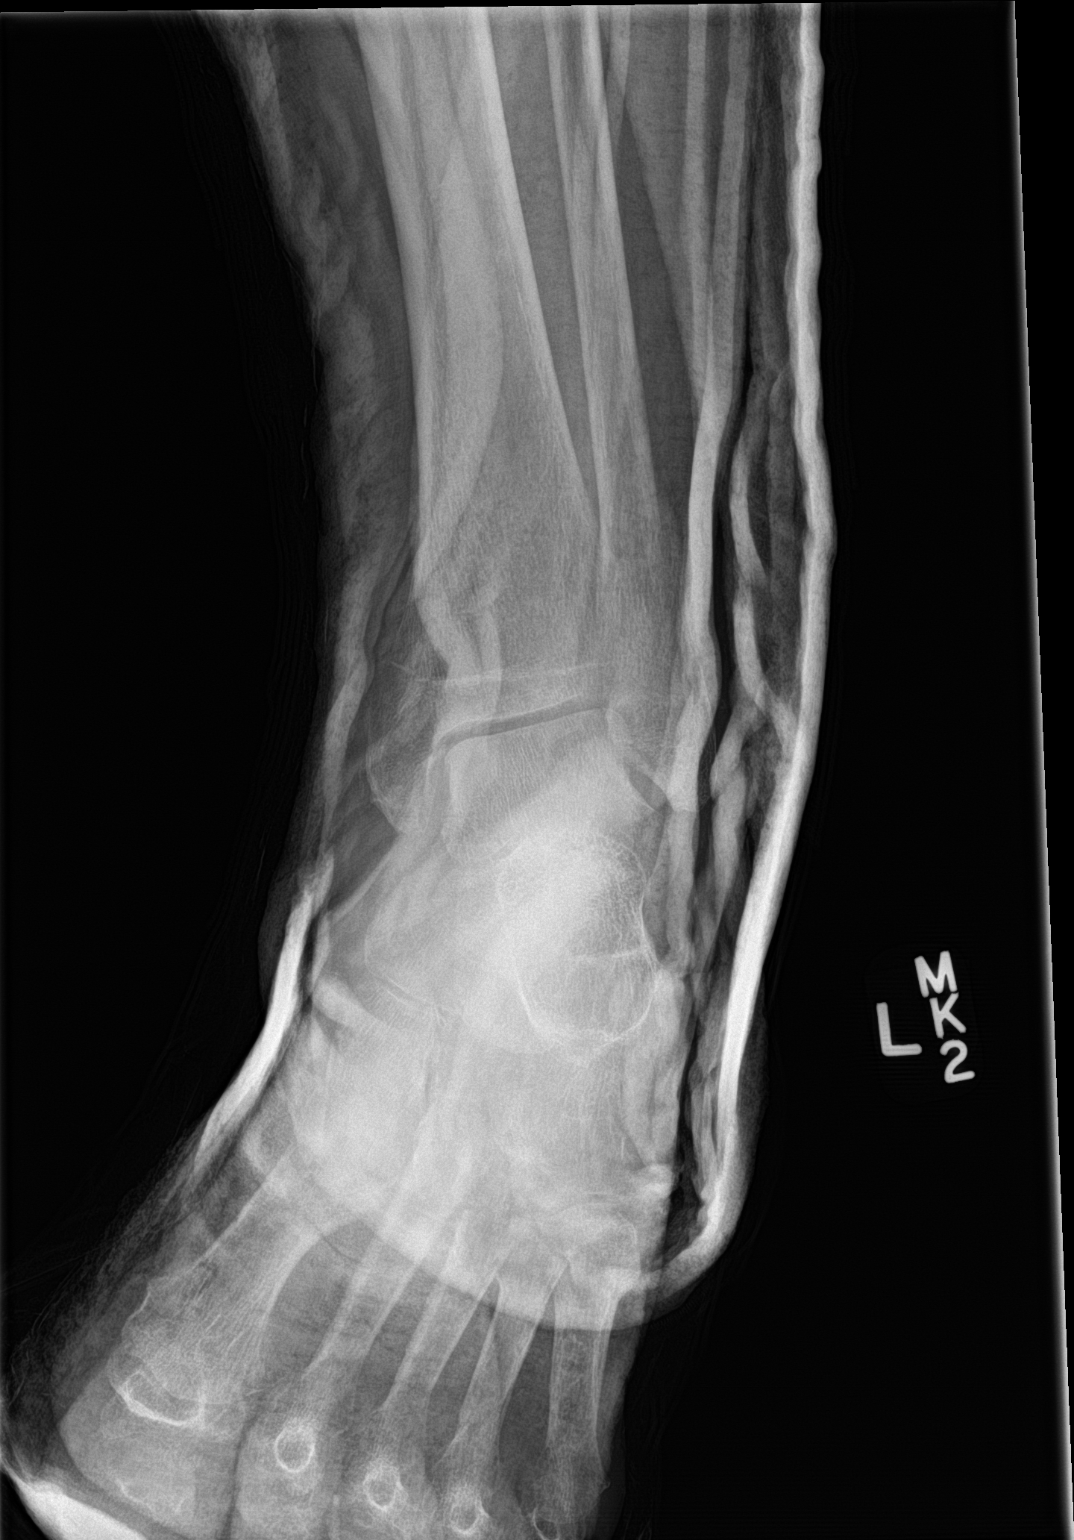

[ankle obl]
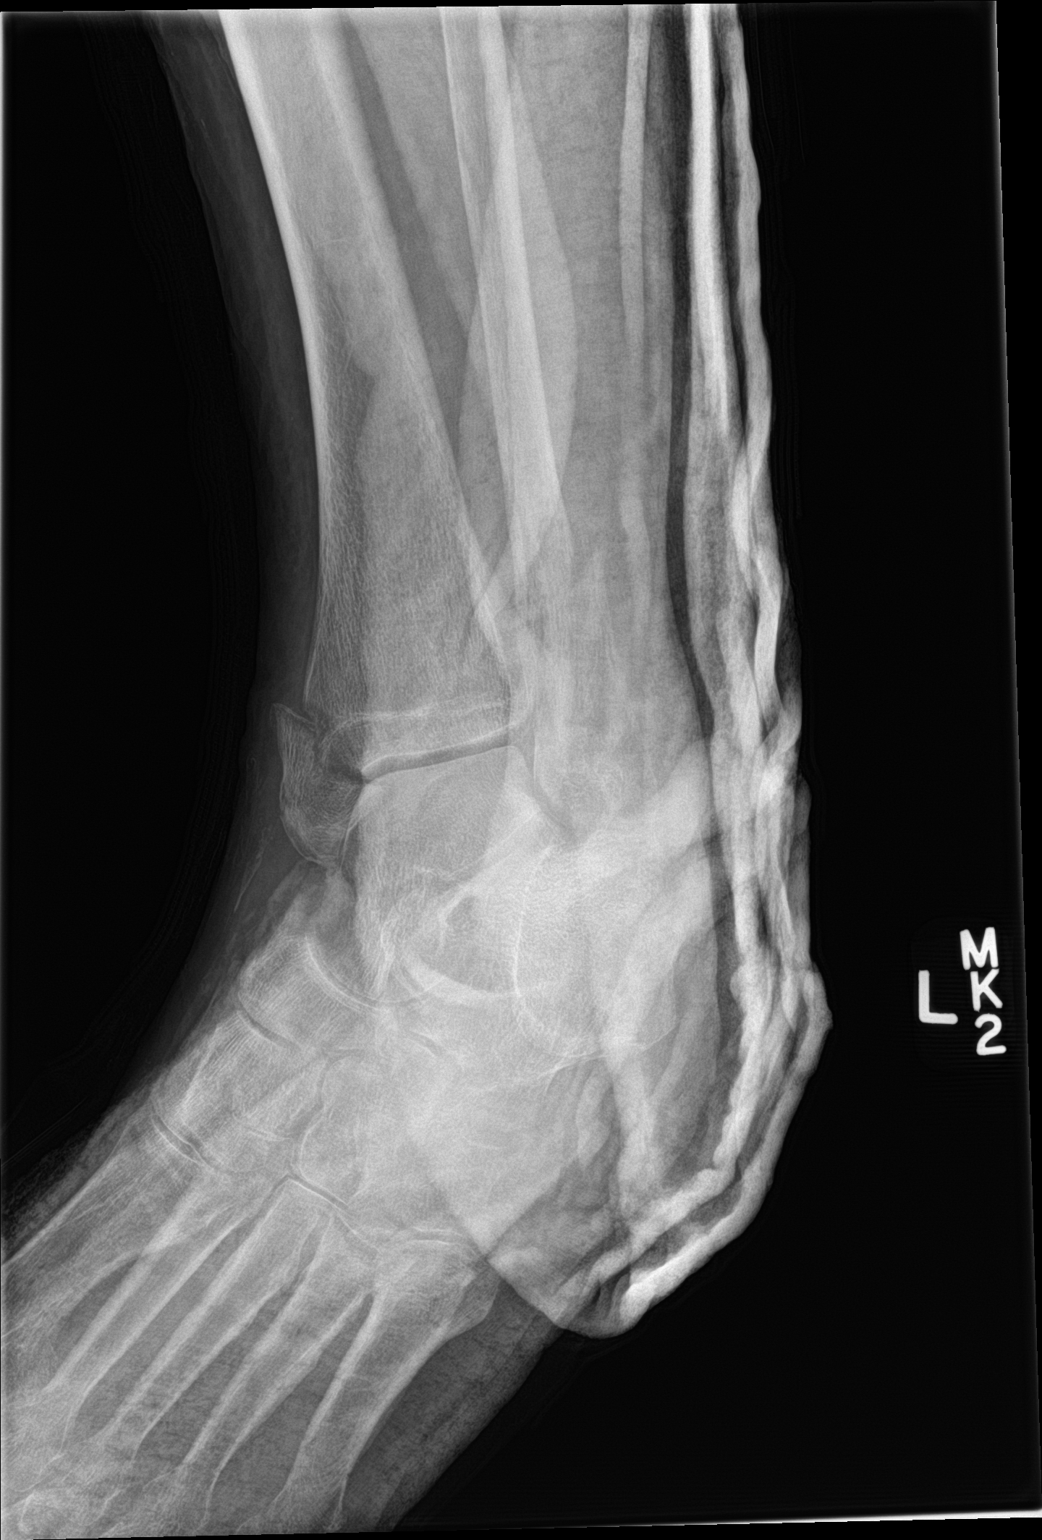

[ankle lat]
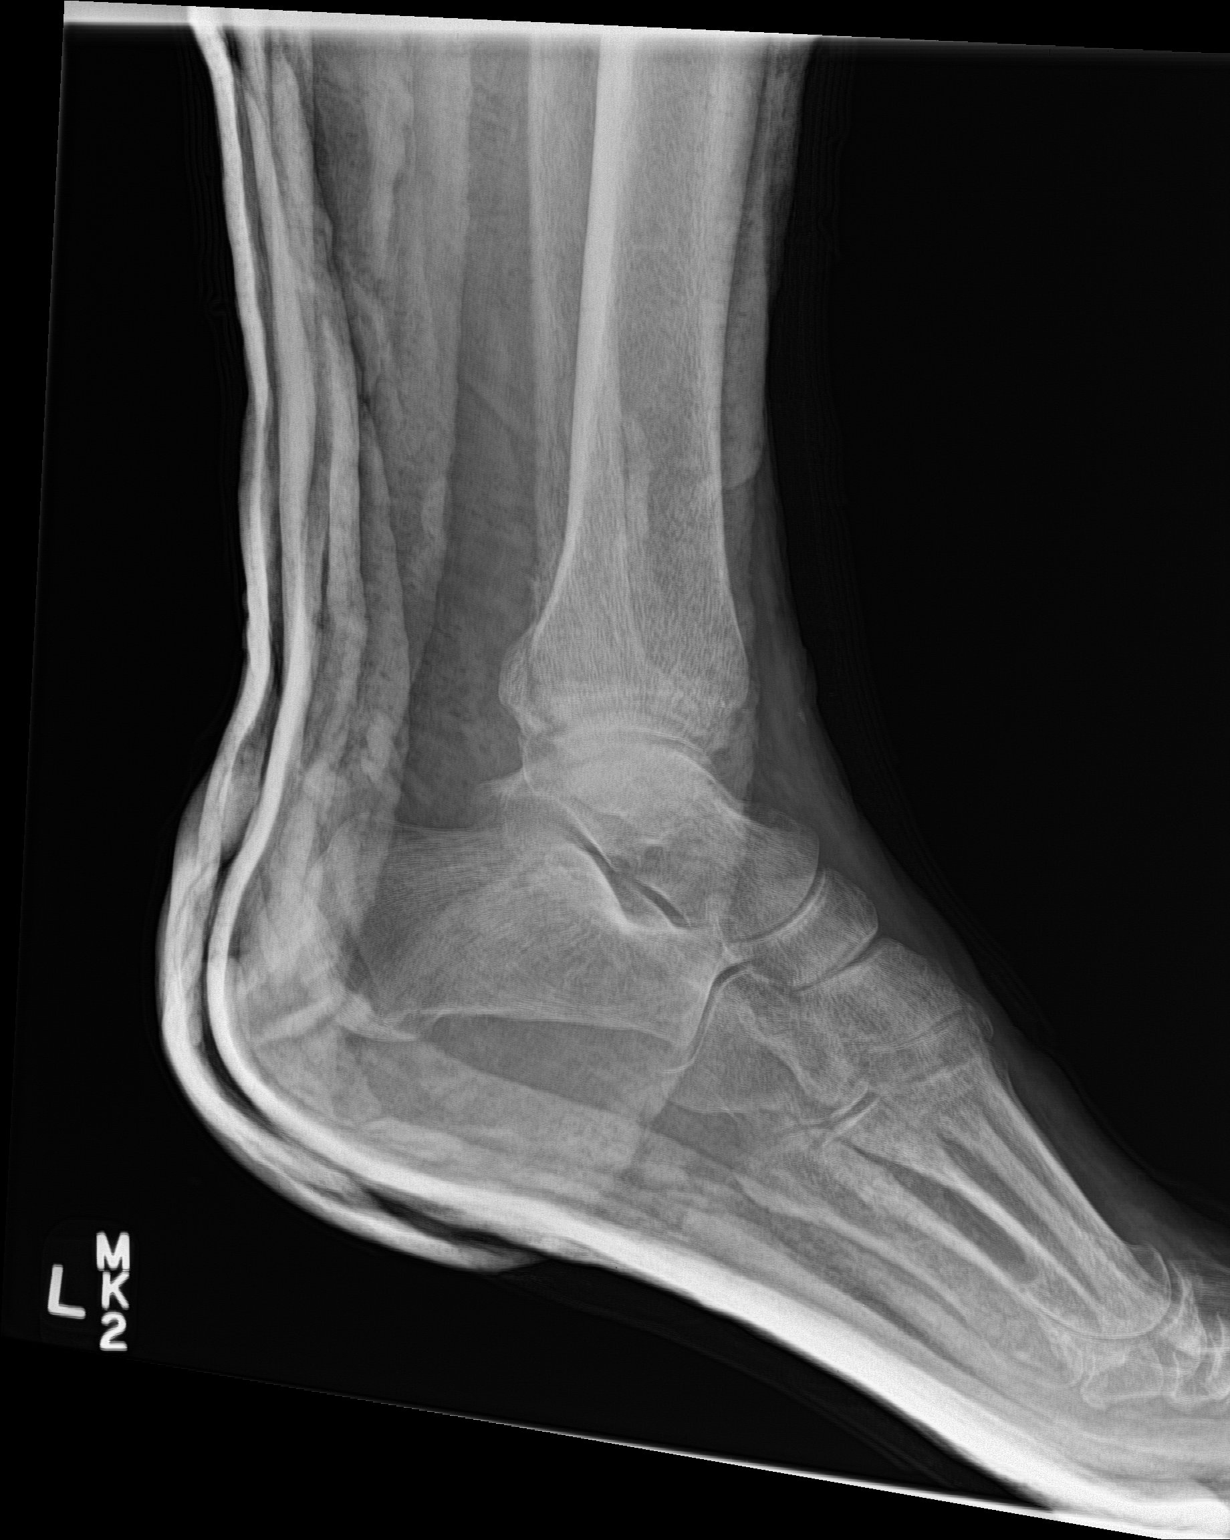

[3 of 3 positions shown; findings below may reference images not displayed]

FINDINGS: Previous noted fracture/dislocation of the distal tibia and fibula
are better aligned on the post reduction films. Soft tissue and
osseous details are partially obscured by overlying cast.
IMPRESSION: Previous noted fracture/dislocation of the distal tibia and fibula
are better aligned on the post reduction films.

## 2021-01-12 ENCOUNTER — Ambulatory Visit: Payer: Medicare Other | Admitting: Family Medicine

## 2021-01-16 ENCOUNTER — Encounter: Payer: Self-pay | Admitting: Family Medicine

## 2021-01-16 ENCOUNTER — Ambulatory Visit (INDEPENDENT_AMBULATORY_CARE_PROVIDER_SITE_OTHER): Payer: Medicare Other | Admitting: Family Medicine

## 2021-01-16 ENCOUNTER — Other Ambulatory Visit: Payer: Self-pay

## 2021-01-16 VITALS — BP 149/77 | HR 73 | Temp 98.0°F | Ht 62.0 in | Wt 133.6 lb

## 2021-01-16 DIAGNOSIS — G4700 Insomnia, unspecified: Secondary | ICD-10-CM

## 2021-01-16 DIAGNOSIS — L219 Seborrheic dermatitis, unspecified: Secondary | ICD-10-CM | POA: Diagnosis not present

## 2021-01-16 DIAGNOSIS — F439 Reaction to severe stress, unspecified: Secondary | ICD-10-CM

## 2021-01-16 MED ORDER — HYDROCORTISONE-ACETIC ACID 1-2 % OT SOLN: 3.0000 [drp] | Freq: Two times a day (BID) | OTIC | 0 refills | Status: DC

## 2021-01-16 NOTE — Progress Notes (Signed)
° °  Joan Mclaughlin is a 81 y.o. female who presents today for an office visit.  Assessment/Plan:  Chronic Problems Addressed Today: Insomnia Current regimen works well.  She takes 3 mg of melatonin nightly, Remeron 7.5 mg nightly and hydroxyzine as needed.  She will let me know if this becomes an issue again.  Stress She has been under more stress recently due to the mental health of her son who is a recovering alcoholic.  She feels like she is handling everything reasonably well.  She will be following up with her therapist later this month.  Seborrheic dermatitis She will continue using triamcinolone for seborrheic dermatitis on eyebrows.  She does have some evidence of seborrheic dermatitis in bilateral ear canals as well.  We will start hydrocortisone-acetic acid drops.  She will let me know if not improving.     Subjective:  HPI:  See A/P for status of chronic conditions.  Main concern today is itchy ears canals.  She has a history of seborrheic dermatitis on eyebrows.  Has used triamcinolone for this in the past.  She is concerned she may have similar condition ear canals.  This has been going on for several weeks to months.  No specific treatments tried.  She is also having some issues with intermittent dizziness for the last several weeks.  She recently changed her dose of melatonin from 5 to 3 mg and this seemed to resolve the issue.  She has not had any issues since then.       Objective:  Physical Exam: BP (!) 149/77    Pulse 73    Temp 98 F (36.7 C) (Temporal)    Ht 5\' 2"  (1.575 m)    Wt 133 lb 9.6 oz (60.6 kg)    SpO2 98%    BMI 24.44 kg/m   Gen: No acute distress, resting comfortably HEENT: Bilateral EAC with flakiness and erythema. CV: Regular rate and rhythm with no murmurs appreciated Pulm: Normal work of breathing, clear to auscultation bilaterally with no crackles, wheezes, or rhonchi Neuro: Grossly normal, moves all extremities Psych: Normal affect and  thought content      Andrianna Manalang M. [name], MD 01/16/2021 11:44 AM

## 2021-01-16 NOTE — Assessment & Plan Note (Signed)
Current regimen works well.  She takes 3 mg of melatonin nightly, Remeron 7.5 mg nightly and hydroxyzine as needed.  She will let me know if this becomes an issue again.

## 2021-01-16 NOTE — Assessment & Plan Note (Signed)
She will continue using triamcinolone for seborrheic dermatitis on eyebrows.  She does have some evidence of seborrheic dermatitis in bilateral ear canals as well.  We will start hydrocortisone-acetic acid drops.  She will let me know if not improving.

## 2021-01-16 NOTE — Patient Instructions (Signed)
It was very nice to see you today!  Please drop drops.  Let me know if not proving in the next several days.  Let me know if the dizziness comes back.  Take care, Dr [name]  PLEASE NOTE:  If you had any lab tests please let us know if you have not heard back within a few days. You may see your results on mychart before we have a chance to review them but we will give you a call once they are reviewed by [place]. If we ordered any referrals today, please let us know if you have not heard from their office within the next week.   Please try these tips to maintain a healthy lifestyle:  Eat at least 3 REAL meals and 1-2 snacks per day.  Aim for no more than 5 hours between eating.  If you eat breakfast, please do so within one hour of getting up.   Each meal should contain half fruits/vegetables, one quarter protein, and one quarter carbs (no bigger than a computer mouse)  Cut down on sweet beverages. This includes juice, soda, and sweet tea.   Drink at least 1 glass of water with each meal and aim for at least 8 glasses per day  Exercise at least 150 minutes every week.

## 2021-01-16 NOTE — Assessment & Plan Note (Signed)
She has been under more stress recently due to the mental health of her son who is a recovering alcoholic.  She feels like she is handling everything reasonably well.  She will be following up with her therapist later this month.

## 2021-01-18 ENCOUNTER — Ambulatory Visit: Payer: Medicare Other | Admitting: Family Medicine

## 2021-01-21 IMAGING — RF DG ANKLE COMPLETE 3+V*L*
1 series · 6 of 6 positions shown · IV contrast (agent unspecified)
Comparison: February 09, 2019.

CLINICAL DATA: Open reduction internal fixation of left ankle
fracture.

EXAM:
DG C-ARM 1-60 MIN; LEFT ANKLE COMPLETE - 3+ VIEW
CONTRAST:  None.
FLUOROSCOPY TIME:  Fluoroscopy Time:  18.6 seconds.
Number of Acquired Spot Images: 6.

[Series 1: run · 6 of 6 slices shown]
[im 1/6]
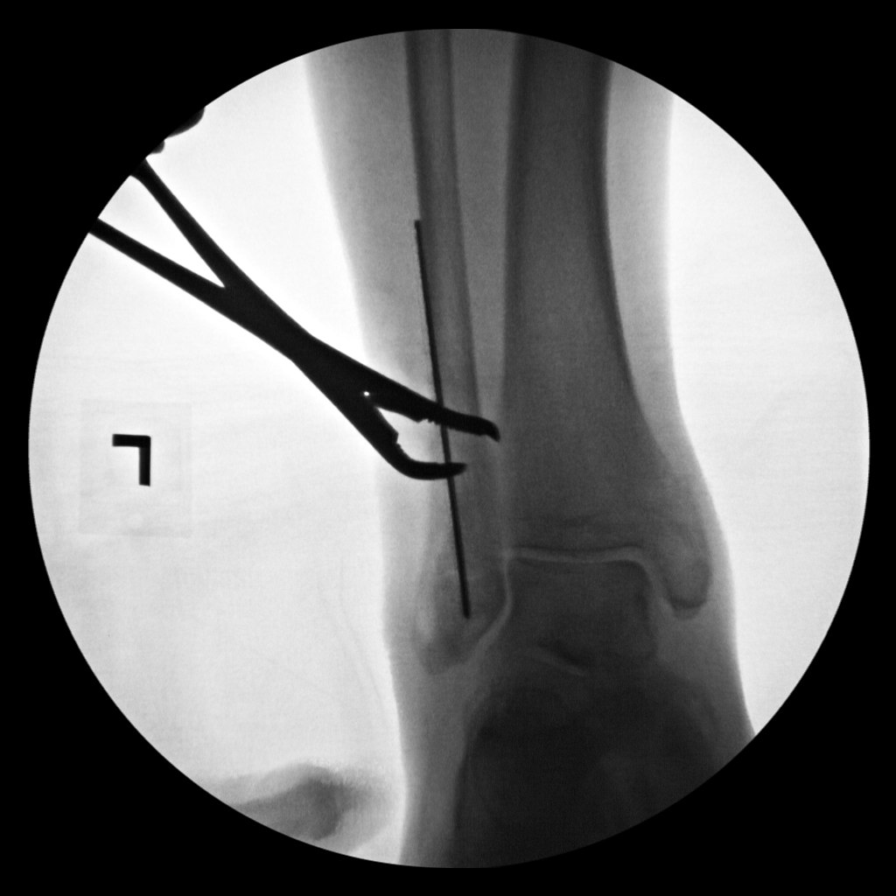
[im 2/6]
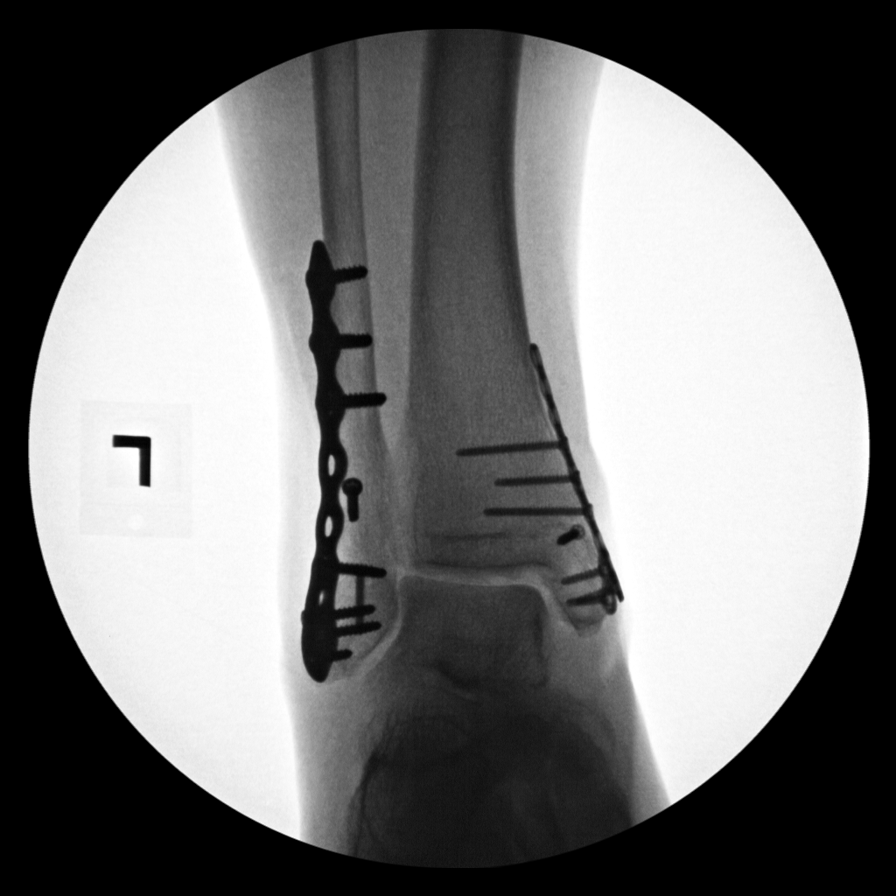
[im 3/6]
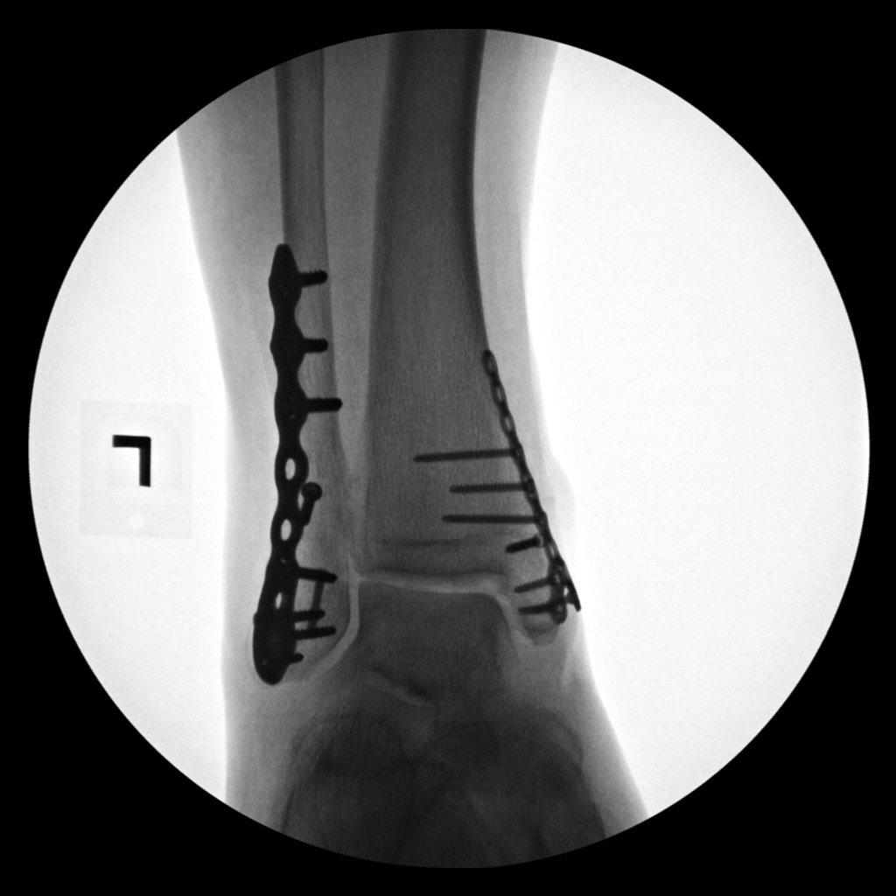
[im 4/6]
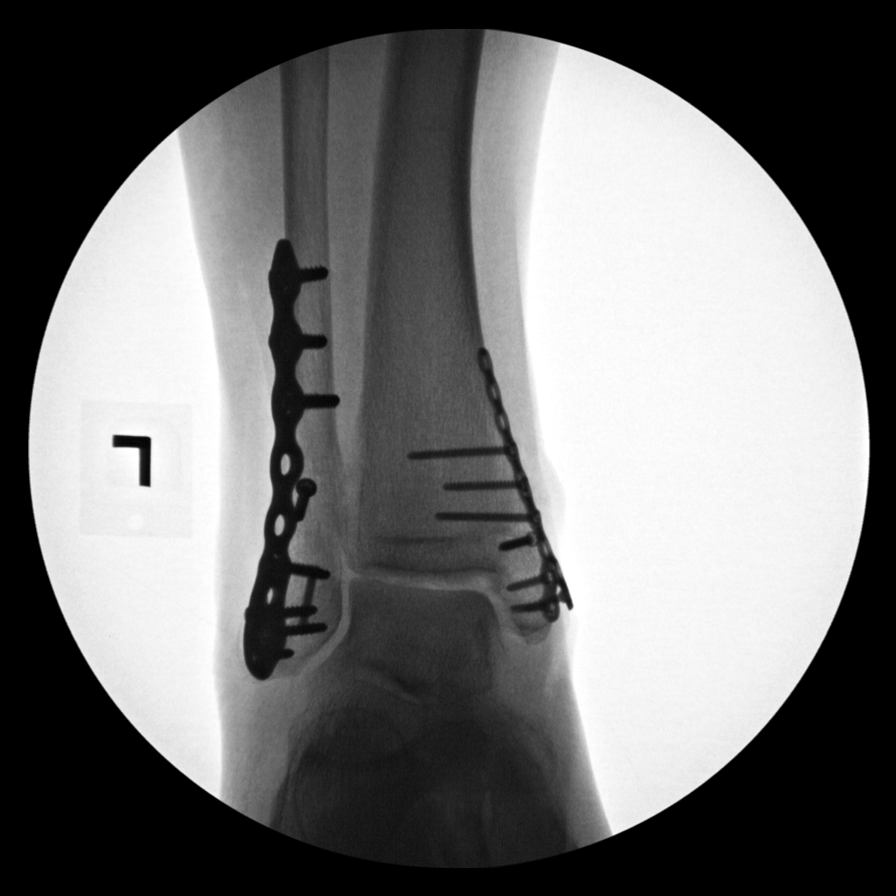
[im 5/6]
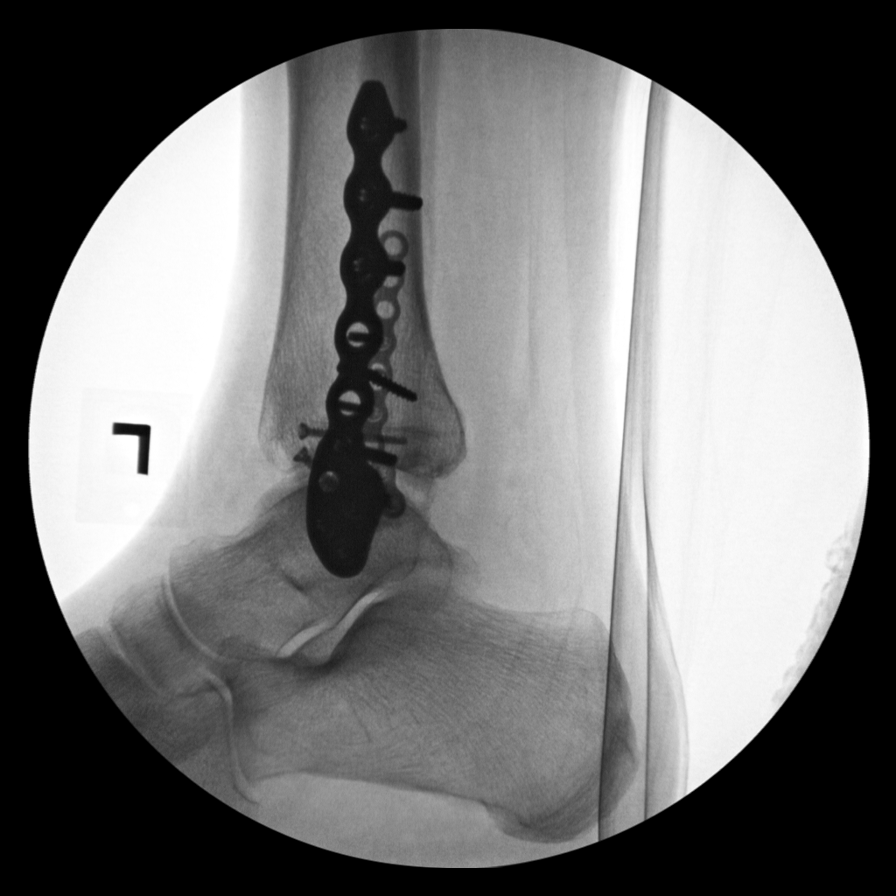
[im 6/6]
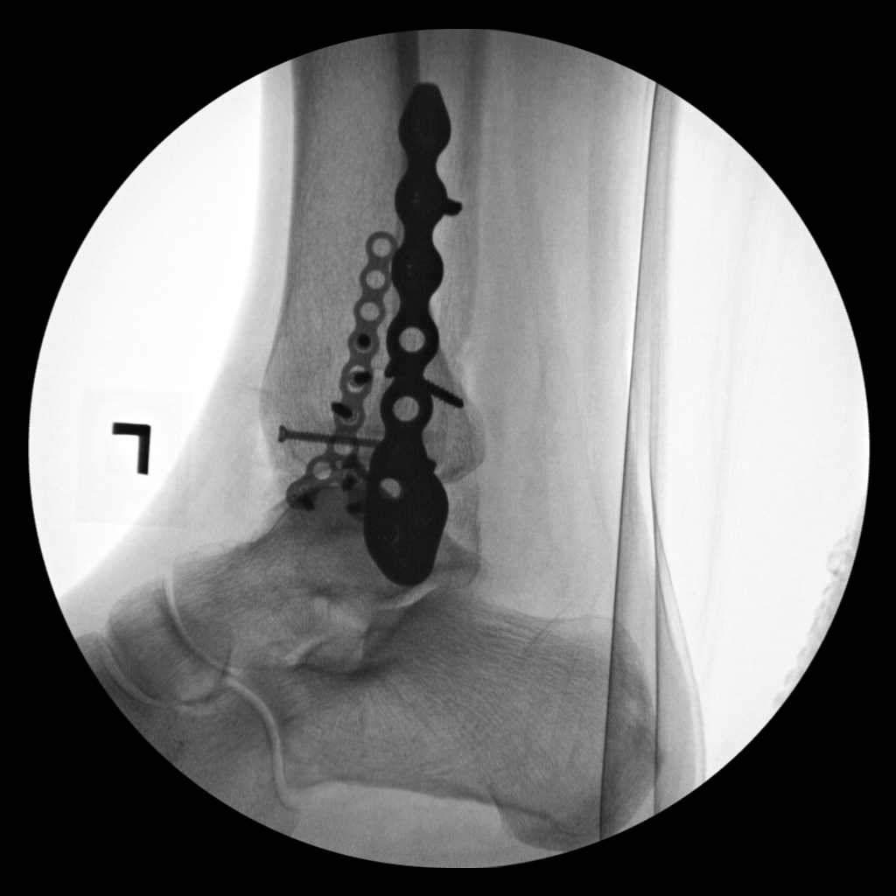

[6 of 6 positions shown; findings below may reference images not displayed]

FINDINGS: Six intraoperative fluoroscopic images were obtained of the left
ankle. These images demonstrate surgical internal fixation of distal
left tibial and fibular fractures.
IMPRESSION: Fluoroscopic guidance provided during open reduction and internal
fixation of distal left tibial and fibular fractures.

## 2021-01-30 ENCOUNTER — Telehealth: Payer: Self-pay

## 2021-01-30 NOTE — Telephone Encounter (Signed)
See note

## 2021-01-30 NOTE — Telephone Encounter (Signed)
Patient schedule appointment with PCP tomorrow

## 2021-01-30 NOTE — Telephone Encounter (Signed)
Is she still using the drops? The drops are usually used to treat ear infections as well. Would she be able to come in for a visit? It would be ideal if I could take another look.  [name]. [name], MD 01/30/2021 1:51 PM

## 2021-01-30 NOTE — Telephone Encounter (Signed)
Patient states she was given ear drops last week for itching in her ear.  Patient states she now has an ear ache.  Patient wanted to know if this can be treated through a phone visit with Dr. [name]?  States can not complete a virtual visit.  Please advise.

## 2021-01-31 ENCOUNTER — Ambulatory Visit (INDEPENDENT_AMBULATORY_CARE_PROVIDER_SITE_OTHER): Payer: Medicare Other | Admitting: Family Medicine

## 2021-01-31 ENCOUNTER — Encounter: Payer: Self-pay | Admitting: Family Medicine

## 2021-01-31 ENCOUNTER — Other Ambulatory Visit: Payer: Self-pay

## 2021-01-31 VITALS — BP 148/78 | HR 77 | Temp 98.4°F | Wt 133.6 lb

## 2021-01-31 DIAGNOSIS — L219 Seborrheic dermatitis, unspecified: Secondary | ICD-10-CM | POA: Diagnosis not present

## 2021-01-31 DIAGNOSIS — I10 Essential (primary) hypertension: Secondary | ICD-10-CM | POA: Diagnosis not present

## 2021-01-31 MED ORDER — MOMETASONE FUROATE 0.1 % EX SOLN: CUTANEOUS | 0 refills | Status: DC

## 2021-01-31 NOTE — Progress Notes (Signed)
° °  Joan Mclaughlin is a 81 y.o. female who presents today for an office visit.  Assessment/Plan:  New/Acute Problems: Otalgia Reassuring exam.  Could be due to irritation from her hydrocortisone-acetic acid drops.  We will switch back to mometasone.  She will stop taking the hydrocortisone-acetic acid drops.  She will let me know if not improving and we can refer her back to see ENT.  Chronic Problems Addressed Today: Seborrheic dermatitis We will switch back to mometasone drops.  Stop hydrocortisone-acetic acid drops.  She will let me know if not improving.  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  Essential hypertension At goal per JNC 8 goal on amlodipine 5 mg daily.     Subjective:  HPI:  She is here with earache. Last saw her in the office on 01/16/2021. Her symptoms included itching in her ear canals at that time. She was prescribed hydrocortisone acetic acid drops at that visit. She notes itching has improved and had no symptoms since then.  However, she has developed ear pain. This has been going on for a week. She described pain as " stabbing and sharp". She notes she was experiencing pain after every 20 -30 minutes yesterday. She still have some pain but it better than yesterday. She has used triamcinolone in the past which worked well.        Objective:  Physical Exam: BP (!) 148/78 (BP Location: Right Arm, Patient Position: Sitting)    Pulse 77    Temp 98.4 F (36.9 C) (Temporal)    Wt 133 lb 9.6 oz (60.6 kg)    SpO2 98%    BMI 24.44 kg/m   Gen: No acute distress, resting comfortably HEENT: Small amount of seborrheic dermatitis in bilateral EACs but otherwise no otitis media or signs of infection. CV: Regular rate and rhythm with no murmurs appreciated Pulm: Normal work of breathing, clear to auscultation bilaterally with no crackles, wheezes, or rhonchi Neuro: Grossly normal, moves all extremities Psych: Normal  affect and thought content       I,Savera Zaman,acting as a scribe for Dimas Chyle, MD.,have documented all relevant documentation on the behalf of Dimas Chyle, MD,as directed by  Dimas Chyle, MD while in the presence of Dimas Chyle, MD.   I, Dimas Chyle, MD, have reviewed all documentation for this visit. The documentation on 01/31/21 for the exam, diagnosis, procedures, and orders are all accurate and complete.  [name]. [name], MD 01/31/2021 3:45 PM

## 2021-01-31 NOTE — Patient Instructions (Signed)
It was very nice to see you today!  Please stop the hydrocortisone-acetic acid drops.  I will refill your mometasone.  Please let us know if your blood pressure is persistently elevated to 150/90 or higher.   Let us know if your ear pain returns.  Take care, Dr [name]  PLEASE NOTE:  If you had any lab tests please let us know if you have not heard back within a few days. You may see your results on mychart before we have a chance to review them but we will give you a call once they are reviewed by [place]. If we ordered any referrals today, please let us know if you have not heard from their office within the next week.   Please try these tips to maintain a healthy lifestyle:  Eat at least 3 REAL meals and 1-2 snacks per day.  Aim for no more than 5 hours between eating.  If you eat breakfast, please do so within one hour of getting up.   Each meal should contain half fruits/vegetables, one quarter protein, and one quarter carbs (no bigger than a computer mouse)  Cut down on sweet beverages. This includes juice, soda, and sweet tea.   Drink at least 1 glass of water with each meal and aim for at least 8 glasses per day  Exercise at least 150 minutes every week.

## 2021-01-31 NOTE — Assessment & Plan Note (Signed)
We will switch back to mometasone drops.  Stop hydrocortisone-acetic acid drops.  She will let me know if not improving.  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

## 2021-01-31 NOTE — Assessment & Plan Note (Signed)
At goal per [facility] 8 goal on amlodipine 5 mg daily.

## 2021-02-07 DIAGNOSIS — I10 Essential (primary) hypertension: Secondary | ICD-10-CM

## 2021-02-27 ENCOUNTER — Ambulatory Visit: Payer: Medicare Other | Admitting: Neurology

## 2021-03-28 ENCOUNTER — Ambulatory Visit: Payer: Medicare Other | Admitting: Physician Assistant

## 2021-05-04 ENCOUNTER — Encounter: Payer: Self-pay | Admitting: Family Medicine

## 2021-05-04 ENCOUNTER — Ambulatory Visit (INDEPENDENT_AMBULATORY_CARE_PROVIDER_SITE_OTHER): Payer: Medicare Other | Admitting: Family Medicine

## 2021-05-04 VITALS — BP 166/79 | HR 73 | Temp 98.4°F | Ht 62.0 in | Wt 143.6 lb

## 2021-05-04 DIAGNOSIS — I10 Essential (primary) hypertension: Secondary | ICD-10-CM | POA: Diagnosis not present

## 2021-05-04 DIAGNOSIS — K5790 Diverticulosis of intestine, part unspecified, without perforation or abscess without bleeding: Secondary | ICD-10-CM | POA: Diagnosis not present

## 2021-05-04 DIAGNOSIS — R300 Dysuria: Secondary | ICD-10-CM

## 2021-05-04 LAB — POCT URINALYSIS DIPSTICK
Bilirubin, UA: NEGATIVE
Blood, UA: NEGATIVE
Glucose, UA: NEGATIVE
Ketones, UA: NEGATIVE
Leukocytes, UA: NEGATIVE
Nitrite, UA: NEGATIVE
Protein, UA: NEGATIVE
Spec Grav, UA: 1.015 (ref 1.010–1.025)
Urobilinogen, UA: 0.2 E.U./dL
pH, UA: 6.5 (ref 5.0–8.0)

## 2021-05-04 MED ORDER — METRONIDAZOLE 500 MG PO TABS: 500.0000 mg | ORAL_TABLET | Freq: Two times a day (BID) | ORAL | 0 refills | Status: DC

## 2021-05-04 MED ORDER — CIPROFLOXACIN HCL 500 MG PO TABS: 500.0000 mg | ORAL_TABLET | Freq: Two times a day (BID) | ORAL | 0 refills | Status: DC

## 2021-05-04 NOTE — Assessment & Plan Note (Signed)
Elevated today though typically well controlled.  We will continue amlodipine 5 mg daily.  She can continue to monitor at home. ?

## 2021-05-04 NOTE — Assessment & Plan Note (Signed)
Likely has diverticular flare based on her symptoms and previous history.  She has had a poor reaction to Augmentin in the past.  We will empirically start a course of Cipro and Flagyl.  Encouraged hydration.  We discussed dietary modifications.  She will let me know if not improving. ?

## 2021-05-04 NOTE — Progress Notes (Signed)
? ?  Joan Mclaughlin is a 81 y.o. female who presents today for an office visit. ? ?Assessment/Plan:  ?New/Acute Problems: ?Abdominal pain ?Likely mild diverticular flare.  Her UA is normal and she does not have any other symptoms consistent with UTI.  We will be starting Cipro and Flagyl as below which should treat most UTIs.  Her urine culture is pending.  She will let us know if not improving in the next few days.  If symptoms persist, would consider CT scan. ? ?Chronic Problems Addressed Today: ?Diverticulosis ?Likely has diverticular flare based on her symptoms and previous history.  She has had a poor reaction to Augmentin in the past.  We will empirically start a course of Cipro and Flagyl.  Encouraged hydration.  We discussed dietary modifications.  She will let me know if not improving. ? ?Essential hypertension ?Elevated today though typically well controlled.  We will continue amlodipine 5 mg daily.  She can continue to monitor at home. ? ? ?  ?Subjective:  ?HPI: ? ?Patient here with lower abdominal pain for the past week that is localized to left lower quadrant over the last day or so.  She is concerned about  possible UTI or diverticulitis. Symptoms started about a week ago. She has had some urgency of BM.  No urinary urgency.  She has been taking some left over Cephalexin for the past 2 days.  She is not sure if this is helped.  No other treatment tried. No dysuria or hematuria. She does have a history of diverticulosis. No reported fever or chills. No nausea or vomiting.  ? ?   ?  ?Objective:  ?Physical Exam: ?BP (!) 166/79   Pulse 73   Temp 98.4 ?F (36.9 ?C) (Temporal)   Ht '5\' 2"'$  (1.575 m)   Wt 143 lb 9.6 oz (65.1 kg)   SpO2 97%   BMI 26.26 kg/m?   ?Gen: No acute distress, resting comfortably ?CV: Regular rate and rhythm with no murmurs appreciated ?Pulm: Normal work of breathing, clear to auscultation bilaterally with no crackles, wheezes, or rhonchi ?Back: No CVA tenderness ?GI: Bowel  sounds present.  Soft, nondistended.  Slight tenderness to palpation left lower quadrant. ?Neuro: Grossly normal, moves all extremities ?Psych: Normal affect and thought content ? ?   ? ? ?I,Savera Zaman,acting as a Education administrator for Dimas Chyle, MD.,have documented all relevant documentation on the behalf of Dimas Chyle, MD,as directed by  Dimas Chyle, MD while in the presence of Dimas Chyle, MD.  ? ?I, Dimas Chyle, MD, have reviewed all documentation for this visit. The documentation on 05/04/21 for the exam, diagnosis, procedures, and orders are all accurate and complete. ? ?[name]. [name], MD ?05/04/2021 1:55 PM  ? ?

## 2021-05-04 NOTE — Patient Instructions (Signed)
It was very nice to see you today! ? ?I think you probably have diverticulitis.  Please start the ciprofloxacin and Flagyl.  This should treat most UTIs as well.  We checked a urine culture that will come back early next week.  Please let us know if your symptoms or not improving. ? ?Take care, ?Dr [name] ? ?PLEASE NOTE: ? ?If you had any lab tests please let us know if you have not heard back within a few days. You may see your results on mychart before we have a chance to review them but we will give you a call once they are reviewed by [place]. If we ordered any referrals today, please let us know if you have not heard from their office within the next week.  ? ?Please try these tips to maintain a healthy lifestyle: ? ?Eat at least 3 REAL meals and 1-2 snacks per day.  Aim for no more than 5 hours between eating.  If you eat breakfast, please do so within one hour of getting up.  ? ?Each meal should contain half fruits/vegetables, one quarter protein, and one quarter carbs (no bigger than a computer mouse) ? ?Cut down on sweet beverages. This includes juice, soda, and sweet tea.  ? ?Drink at least 1 glass of water with each meal and aim for at least 8 glasses per day ? ?Exercise at least 150 minutes every week.   ? ?Diverticulitis ? ?Diverticulitis is infection or inflammation of small pouches (diverticula) in the colon that form due to a condition called diverticulosis. Diverticula can trap stool (feces) and bacteria, causing infection and inflammation. ?Diverticulitis may cause severe stomach pain and diarrhea. It may lead to tissue damage in the colon that causes bleeding or blockage. The diverticula may also burst (rupture) and cause infected stool to enter other areas of the abdomen. ?What are the causes? ?This condition is caused by stool becoming trapped in the diverticula, which allows bacteria to grow in the diverticula. This leads to inflammation and infection. ?What increases the risk? ?You are more likely  to develop this condition if you have diverticulosis. The risk increases if you: ?Are overweight or obese. ?Do not get enough exercise. ?Drink alcohol. ?Use tobacco products. ?Eat a diet that has a lot of red meat such as beef, pork, or lamb. ?Eat a diet that does not include enough fiber. High-fiber foods include fruits, vegetables, beans, nuts, and whole grains. ?Are over 22 years of age. ?What are the signs or symptoms? ?Symptoms of this condition may include: ?Pain and tenderness in the abdomen. The pain is normally located on the left side of the abdomen, but it may occur in other areas. ?Fever and chills. ?Nausea. ?Vomiting. ?Cramping. ?Bloating. ?Changes in bowel routines. ?Blood in your stool. ?How is this diagnosed? ?This condition is diagnosed based on: ?Your medical history. ?A physical exam. ?Tests to make sure there is nothing else causing your condition. These tests may include: ?Blood tests. ?Urine tests. ?CT scan of the abdomen. ?How is this treated? ?Most cases of this condition are mild and can be treated at home. Treatment may include: ?Taking over-the-counter pain medicines. ?Following a clear liquid diet. ?Taking antibiotic medicines by mouth. ?Resting. ?More severe cases may need to be treated at a hospital. Treatment may include: ?Not eating or drinking. ?Taking prescription pain medicine. ?Receiving antibiotic medicines through an IV. ?Receiving fluids and nutrition through an IV. ?Surgery. ?When your condition is under control, your health care provider may recommend that  you have a colonoscopy. This is an exam to look at the entire large intestine. During the exam, a lubricated, bendable tube is inserted into the anus and then passed into the rectum, colon, and other parts of the large intestine. A colonoscopy can show how severe your diverticula are and whether something else may be causing your symptoms. ?Follow these instructions at home: ?Medicines ?Take over-the-counter and  prescription medicines only as told by your health care provider. These include fiber supplements, probiotics, and stool softeners. ?If you were prescribed an antibiotic medicine, take it as told by your health care provider. Do not stop taking the antibiotic even if you start to feel better. ?Ask your health care provider if the medicine prescribed to you requires you to avoid driving or using machinery. ?Eating and drinking ? ?Follow a full liquid diet or another diet as directed by your health care provider. ?After your symptoms improve, your health care provider may tell you to change your diet. He or she may recommend that you eat a diet that contains at least 25 grams (25 g) of fiber daily. Fiber makes it easier to pass stool. Healthy sources of fiber include: ?Berries. One cup contains 4-8 grams of fiber. ?Beans or lentils. One-half cup contains 5-8 grams of fiber. ?Green vegetables. One cup contains 4 grams of fiber. ?Avoid eating red meat. ?General instructions ?Do not use any products that contain nicotine or tobacco, such as cigarettes, e-cigarettes, and chewing tobacco. If you need help quitting, ask your health care provider. ?Exercise for at least 30 minutes, 3 times each week. You should exercise hard enough to raise your heart rate and break a sweat. ?Keep all follow-up visits as told by your health care provider. This is important. You may need to have a colonoscopy. ?Contact a health care provider if: ?Your pain does not improve. ?Your bowel movements do not return to normal. ?Get help right away if: ?Your pain gets worse. ?Your symptoms do not get better with treatment. ?Your symptoms suddenly get worse. ?You have a fever. ?You vomit more than one time. ?You have stools that are bloody, black, or tarry. ?Summary ?Diverticulitis is infection or inflammation of small pouches (diverticula) in the colon that form due to a condition called diverticulosis. Diverticula can trap stool (feces) and  bacteria, causing infection and inflammation. ?You are at higher risk for this condition if you have diverticulosis and you eat a diet that does not include enough fiber. ?Most cases of this condition are mild and can be treated at home. More severe cases may need to be treated at a hospital. ?When your condition is under control, your health care provider may recommend that you have an exam called a colonoscopy. This exam can show how severe your diverticula are and whether something else may be causing your symptoms. ?Keep all follow-up visits as told by your health care provider. This is important. ?This information is not intended to replace advice given to you by your health care provider. Make sure you discuss any questions you have with your health care provider. ?Document Revised: 10/06/2018 Document Reviewed: 10/06/2018 ?Elsevier Patient Education ? [place]. ? ?

## 2021-05-05 LAB — URINE CULTURE
MICRO NUMBER:: 13321087
Result:: NO GROWTH
SPECIMEN QUALITY:: ADEQUATE

## 2021-05-08 ENCOUNTER — Telehealth: Payer: Self-pay

## 2021-05-08 NOTE — Telephone Encounter (Signed)
Patient has called back in regard to lab results.  I have given patient Dr. Ellwood Handler response.  Patient understood.

## 2021-05-08 NOTE — Progress Notes (Signed)
Please inform patient of the following: ? ?Her urine culture is negative for UTI. Recommend she finish her course of antibiotics and let us know if her symptoms are not improving. ? ?[name]. [name], MD ?05/08/2021 8:06 AM  ?

## 2021-05-12 ENCOUNTER — Telehealth (INDEPENDENT_AMBULATORY_CARE_PROVIDER_SITE_OTHER): Payer: Medicare Other | Admitting: Family Medicine

## 2021-05-12 VITALS — Ht 62.0 in | Wt 143.0 lb

## 2021-05-12 DIAGNOSIS — K5790 Diverticulosis of intestine, part unspecified, without perforation or abscess without bleeding: Secondary | ICD-10-CM | POA: Diagnosis not present

## 2021-05-12 MED ORDER — METRONIDAZOLE 500 MG PO TABS: 500.0000 mg | ORAL_TABLET | Freq: Two times a day (BID) | ORAL | 0 refills | Status: AC

## 2021-05-12 MED ORDER — CIPROFLOXACIN HCL 500 MG PO TABS: 500.0000 mg | ORAL_TABLET | Freq: Two times a day (BID) | ORAL | 0 refills | Status: AC

## 2021-05-12 NOTE — Progress Notes (Signed)
? ?  Joan Mclaughlin is a 81 y.o. female who presents today for a telephone visit. ? ?Assessment/Plan:  ?Chronic Problems Addressed Today: ?No problem-specific Assessment & Plan notes found for this encounter. ? ? ?  ?Subjective:  ?HPI: ? ?Patient here for diverticulitis follow up. We saw her about a week ago for this and started here on Cipro and Flagyl.  She has had some improvement however symptoms do not resolve.  She is concerned that infection is still present.  No fevers or chills.  No nausea or vomiting. ? ? ? ?   ?  ?Objective/Observations  ? ?NAD ? ?Telephone Visit  ? ?I connected with Joan Mclaughlin on 05/12/21 at  1:20 PM EDT via telephone and verified that I am speaking with the correct person using two identifiers. I discussed the limitations of evaluation and management by telemedicine and the availability of in person appointments. The patient expressed understanding and agreed to proceed.  ? ?Patient location: Home ?Provider location: [place] ?Persons participating in the virtual visit: Myself and Patient ? ?A total of 10 minutes were spent on medical discussion.  ?   ? ?[name]. [name], MD ?05/12/2021 1:35 PM  ? ? ?

## 2021-05-12 NOTE — Assessment & Plan Note (Signed)
Symptoms are improving though still not fully resolved.  We will send in a few more days of antibiotics.  Encouraged good hydration.  She will follow-up with me next week.  If symptoms are still not improving will need to get a CT scan at that point.  We discussed reasons to return to care.  ?

## 2021-05-16 ENCOUNTER — Telehealth: Payer: Self-pay | Admitting: Pharmacist

## 2021-05-16 NOTE — Progress Notes (Signed)
Chronic Care Management Pharmacy Assistant   Name: Joan Mclaughlin  MRN: [id number] DOB: 08/21/1940  Reason for Encounter: General Adherence Call    Recent office visits:  05/12/2021 [facility] (PCP) Vivi Barrack, MD;  We will send in a few more days of antibiotics for diverticulitis.  05/04/2021 [facility] (PCP) Vivi Barrack, MD;  We will empirically start a course of Cipro and Flagyl.  01/31/2021 [facility] (PCP) Vivi Barrack, MD; We will switch back to mometasone drops.  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  01/16/2021 [facility] (PCP) Vivi Barrack, MD; She will continue using triamcinolone for seborrheic dermatitis on eyebrows.  She does have some evidence of seborrheic dermatitis in bilateral ear canals as well.  We will start hydrocortisone-acetic acid drops  Recent consult visits:  None  Hospital visits:  None in previous 6 months  Medications: Outpatient Encounter Medications as of 05/16/2021  Medication Sig   acetic acid-hydrocortisone (VOSOL-HC) OTIC solution Place 3 drops into both ears 2 (two) times daily.   ALPRAZolam (XANAX) 1 MG tablet TAKE 1/2 TABLET BY MOUTH EVERY DAY AT BEDTIME   amLODipine (NORVASC) 5 MG tablet TAKE 1 TABLET BY MOUTH EVERY DAY   Ascorbic Acid (VITAMIN C) 1000 MG tablet Take 1,000 mg by mouth daily.   b complex vitamins capsule Take 1 capsule by mouth daily.   CALCIUM-MAGNESIUM PO Take 1 tablet by mouth daily. 1000 mg / 500 mg   Cholecalciferol (VITAMIN D3) 50 MCG (2000 UT) capsule Take 2,000 Units by mouth daily.   ciprofloxacin (CIPRO) 500 MG tablet Take 1 tablet (500 mg total) by mouth 2 (two) times daily for 5 days.   hydrocortisone 2.5 % cream Apply topically 2 (two) times daily as needed (Rash). (Patient taking differently: Apply 1 application. topically 2 (two) times daily as needed (Rosacea).)   hydrOXYzine (ATARAX/VISTARIL) 10 MG tablet TAKE 1 TO 2 TABLETS BY  MOUTH AT BEDTIME   ketoconazole (NIZORAL) 2 % shampoo Apply 1 application topically daily as needed for itching.   Lysine 500 MG TABS Take 500 mg by mouth daily.   MELATONIN PO Take 1 capsule by mouth at bedtime.   metroNIDAZOLE (FLAGYL) 500 MG tablet Take 1 tablet (500 mg total) by mouth 2 (two) times daily for 5 days.   mometasone (ELOCON) 0.1 % lotion Apply in affected ear 3 times daily.   Multiple Vitamin (MULTIVITAMIN WITH MINERALS) TABS tablet Take 1 tablet by mouth daily.   Multiple Vitamins-Minerals (ZINC PO) Take 90 mg by mouth daily.   Omega-3 Fatty Acids (FISH OIL PO) Take 630 mg by mouth daily.   Oxcarbazepine (TRILEPTAL) 300 MG tablet Take 1 tablet ('300mg'$ ) by mouth twice daily   Potassium 99 MG TABS Take 99 mg by mouth daily.   Probiotic Product (PROBIOTIC DAILY) CAPS Take 1 capsule by mouth daily. Woman   Red Yeast Rice 600 MG CAPS Take 1,200 mg by mouth at bedtime. With Co-Q-10 100 mg   selenium 200 MCG TABS tablet Take 200 mcg by mouth daily.   triamcinolone cream (KENALOG) 0.1 % Apply 1 application topically daily. At night for Rosacea   TURMERIC PO Take 600 mg by mouth daily.   valACYclovir (VALTREX) 500 MG tablet TAKE 1 TABLET (500 MG TOTAL) BY MOUTH DAILY AS NEEDED (COLD SORES).   No facility-administered encounter medications on file as of 05/16/2021.   Contacted [facility] for General Review Call  Chart Review:  Have there been any documented new, changed, or discontinued medications since last visit? Yes Has there been any documented recent hospitalizations or ED visits since last visit with Clinical Pharmacist? No Brief Summary: patient started short course of oral antibiotics, cipro and flagyl. She was also started on mometasone drops for seborrheic dermatitis.   Adherence Review:  Does the Clinical Pharmacist Assistant have access to adherence rates? Yes Adherence rates for STAR metric medications: None Does the patient have >5 day gap between  last estimated fill dates for any of the above medications or other medication gaps? No Reason for medication gaps.   Disease State Questions:  Able to connect with Patient? Yes Did patient have any problems with their health recently? Yes Note problems and Concerns: Patient states she has sinusitis. Have you had any admissions or emergency room visits or worsening of your condition(s) since last visit? No Have you had any visits with new specialists or providers since your last visit? No Have you had any new health care problem(s) since your last visit? Yes New problem(s) reported: Patient states she had diverticulitis and sinusitis. Have you run out of any of your medications since you last spoke with clinical pharmacist? No What caused you to run out of your medications? Are there any medications you are not taking as prescribed? No Are you having any issues or side effects with your medications? No Do you have any other health concerns or questions you want to discuss with your Clinical Pharmacist before your next visit? No Note additional concerns and questions from Patient. Are there any health concerns that you feel we can do a better job addressing? No Are you having any problems with any of the following since the last visit:  None 12. Any falls since last visit? No 13. Any increased or uncontrolled pain since last visit? No  Care Gaps: Medicare Annual Wellness: Completed Hemoglobin A1C: none available Colonoscopy: Completed 08/31/2020 Dexa Scan: Discontinued Mammogram: Last completed 03/04/2018  Future Appointments  Date Time Provider [place]  06/28/2021  2:10 PM Mansouraty, Telford Nab., MD [facility]  07/06/2021  9:45 AM [facility] HEALTH COACH [facility]  07/11/2021  3:45 PM [facility] CCM PHARMACIST [facility]   Star Rating Drugs: None  April D Calhoun, [place] Pharmacist Assistant [phone]

## 2021-05-25 ENCOUNTER — Other Ambulatory Visit: Payer: Self-pay | Admitting: Dermatology

## 2021-06-07 ENCOUNTER — Encounter: Payer: Self-pay | Admitting: Family Medicine

## 2021-06-07 ENCOUNTER — Ambulatory Visit (INDEPENDENT_AMBULATORY_CARE_PROVIDER_SITE_OTHER): Payer: Medicare Other | Admitting: Family Medicine

## 2021-06-07 VITALS — BP 123/79 | HR 73 | Temp 97.9°F | Ht 62.0 in | Wt 143.0 lb

## 2021-06-07 DIAGNOSIS — J309 Allergic rhinitis, unspecified: Secondary | ICD-10-CM | POA: Diagnosis not present

## 2021-06-07 DIAGNOSIS — R059 Cough, unspecified: Secondary | ICD-10-CM

## 2021-06-07 DIAGNOSIS — I10 Essential (primary) hypertension: Secondary | ICD-10-CM

## 2021-06-07 LAB — POC COVID19 BINAXNOW: SARS Coronavirus 2 Ag: NEGATIVE

## 2021-06-07 LAB — POCT INFLUENZA A/B
Influenza A, POC: NEGATIVE
Influenza B, POC: NEGATIVE

## 2021-06-07 MED ORDER — AZITHROMYCIN 250 MG PO TABS: ORAL_TABLET | ORAL | 0 refills | Status: DC

## 2021-06-07 NOTE — Patient Instructions (Signed)
It was very nice to see you today!  You have a sinus infection.  Please start the antibiotic.  Let us know if not improving by next week.  Take care, Dr [name]  PLEASE NOTE:  If you had any lab tests please let us know if you have not heard back within a few days. You may see your results on mychart before we have a chance to review them but we will give you a call once they are reviewed by [place]. If we ordered any referrals today, please let us know if you have not heard from their office within the next week.   Please try these tips to maintain a healthy lifestyle:  Eat at least 3 REAL meals and 1-2 snacks per day.  Aim for no more than 5 hours between eating.  If you eat breakfast, please do so within one hour of getting up.   Each meal should contain half fruits/vegetables, one quarter protein, and one quarter carbs (no bigger than a computer mouse)  Cut down on sweet beverages. This includes juice, soda, and sweet tea.   Drink at least 1 glass of water with each meal and aim for at least 8 glasses per day  Exercise at least 150 minutes every week.

## 2021-06-07 NOTE — Progress Notes (Signed)
   Joan Mclaughlin is a 81 y.o. female who presents today for an office visit.  Assessment/Plan:  New/Acute Problems: Sinusitis Rapid flu and COVID test negative.  Given length of symptoms will start antibiotics.  Has Augmentin allergy.  We will start azithromycin.  She declined Astelin.  Encouraged hydration.  She can use over-the-counter allergy meds as needed.  We discussed reasons to return to care.  Follow-up as needed.  Chronic Problems Addressed Today: Essential hypertension Well-controlled on amlodipine 5 mg daily.  Allergic rhinitis Likely contributing to her above sinus infection.  She can use over-the-counter meds as needed.  We discussed nasal sprays however she deferred.     Subjective:  HPI:  Patient here with cough and congestion. Started about a 2 weeks ago. Associated with fevers and chills. No sick contacts. Tried taking mucinex which helped moedestly. Has had a lot mucus production. No chest pain or shortness of breath. Some sneezing. Some body aches and fatigue.        Objective:  Physical Exam: BP 123/79   Pulse 73   Temp 97.9 F (36.6 C) (Temporal)   Ht '5\' 2"'$  (1.575 m)   Wt 143 lb (64.9 kg)   SpO2 98%   BMI 26.16 kg/m   Gen: No acute distress, resting comfortably HEENT: TMs are clear effusion.  OP erythematous.  Nasal mucosa erythematous and boggy bilaterally. CV: Regular rate and rhythm with no murmurs appreciated Pulm: Normal work of breathing, clear to auscultation bilaterally with no crackles, wheezes, or rhonchi Neuro: Grossly normal, moves all extremities Psych: Normal affect and thought content      Samiyyah Moffa M. [name], MD 06/07/2021 11:50 AM

## 2021-06-07 NOTE — Assessment & Plan Note (Signed)
Likely contributing to her above sinus infection.  She can use over-the-counter meds as needed.  We discussed nasal sprays however she deferred.

## 2021-06-07 NOTE — Assessment & Plan Note (Signed)
Well-controlled on amlodipine 5 mg daily.

## 2021-06-13 ENCOUNTER — Other Ambulatory Visit: Payer: Self-pay | Admitting: Family Medicine

## 2021-06-28 ENCOUNTER — Ambulatory Visit: Payer: Medicare Other | Admitting: Gastroenterology

## 2021-07-06 ENCOUNTER — Ambulatory Visit (INDEPENDENT_AMBULATORY_CARE_PROVIDER_SITE_OTHER): Payer: Medicare Other

## 2021-07-06 DIAGNOSIS — Z0000 Encounter for general adult medical examination without abnormal findings: Secondary | ICD-10-CM

## 2021-07-06 NOTE — Patient Instructions (Signed)
Joan Mclaughlin , Thank you for taking time to come for your Medicare Wellness Visit. I appreciate your ongoing commitment to your health goals. Please review the following plan we discussed and let me know if I can assist you in the future.   Screening recommendations/referrals: Colonoscopy: no longer required  Mammogram: Done 2/25//20 Recommended yearly ophthalmology/optometry visit for glaucoma screening and checkup Recommended yearly dental visit for hygiene and checkup  Vaccinations: Influenza vaccine: declined  Pneumococcal vaccine: Up to date Tdap vaccine: Done 10/22/17 repeat every 10 years  Shingles vaccine: Completed 3/17, 7/16/ 19 Covid-19:Completed 12/16/19, 01/13/20  Advanced directives: copies in chart   Conditions/risks identified: lose 7- 10 lbs   Next appointment: Follow up in one year for your annual wellness visit    Preventive Care 38 Years and Older, Female Preventive care refers to lifestyle choices and visits with your health care provider that can promote health and wellness. What does preventive care include? A yearly physical exam. This is also called an annual well check. Dental exams once or twice a year. Routine eye exams. Ask your health care provider how often you should have your eyes checked. Personal lifestyle choices, including: Daily care of your teeth and gums. Regular physical activity. Eating a healthy diet. Avoiding tobacco and drug use. Limiting alcohol use. Practicing safe sex. Taking low-dose aspirin every day. Taking vitamin and mineral supplements as recommended by your health care provider. What happens during an annual well check? The services and screenings done by your health care provider during your annual well check will depend on your age, overall health, lifestyle risk factors, and family history of disease. Counseling  Your health care provider may ask you questions about your: Alcohol use. Tobacco use. Drug use. Emotional  well-being. Home and relationship well-being. Sexual activity. Eating habits. History of falls. Memory and ability to understand (cognition). Work and work Statistician. Reproductive health. Screening  You may have the following tests or measurements: Height, weight, and BMI. Blood pressure. Lipid and cholesterol levels. These may be checked every 5 years, or more frequently if you are over 36 years old. Skin check. Lung cancer screening. You may have this screening every year starting at age 80 if you have a 30-pack-year history of smoking and currently smoke or have quit within the past 15 years. Fecal occult blood test (FOBT) of the stool. You may have this test every year starting at age 85. Flexible sigmoidoscopy or colonoscopy. You may have a sigmoidoscopy every 5 years or a colonoscopy every 10 years starting at age 20. Hepatitis C blood test. Hepatitis B blood test. Sexually transmitted disease (STD) testing. Diabetes screening. This is done by checking your blood sugar (glucose) after you have not eaten for a while (fasting). You may have this done every 1-3 years. Bone density scan. This is done to screen for osteoporosis. You may have this done starting at age 62. Mammogram. This may be done every 1-2 years. Talk to your health care provider about how often you should have regular mammograms. Talk with your health care provider about your test results, treatment options, and if necessary, the need for more tests. Vaccines  Your health care provider may recommend certain vaccines, such as: Influenza vaccine. This is recommended every year. Tetanus, diphtheria, and acellular pertussis (Tdap, Td) vaccine. You may need a Td booster every 10 years. Zoster vaccine. You may need this after age 69. Pneumococcal 13-valent conjugate (PCV13) vaccine. One dose is recommended after age 62. Pneumococcal polysaccharide (PPSV23)  vaccine. One dose is recommended after age 4. Talk to your  health care provider about which screenings and vaccines you need and how often you need them. This information is not intended to replace advice given to you by your health care provider. Make sure you discuss any questions you have with your health care provider. Document Released: 01/21/2015 Document Revised: 09/14/2015 Document Reviewed: 10/26/2014 Elsevier Interactive Patient Education  2017 [place] Prevention in the Home Falls can cause injuries. They can happen to people of all ages. There are many things you can do to make your home safe and to help prevent falls. What can I do on the outside of my home? Regularly fix the edges of walkways and driveways and fix any cracks. Remove anything that might make you trip as you walk through a door, such as a raised step or threshold. Trim any bushes or trees on the path to your home. Use bright outdoor lighting. Clear any walking paths of anything that might make someone trip, such as rocks or tools. Regularly check to see if handrails are loose or broken. Make sure that both sides of any steps have handrails. Any raised decks and porches should have guardrails on the edges. Have any leaves, snow, or ice cleared regularly. Use sand or salt on walking paths during winter. Clean up any spills in your garage right away. This includes oil or grease spills. What can I do in the bathroom? Use night lights. Install grab bars by the toilet and in the tub and shower. Do not use towel bars as grab bars. Use non-skid mats or decals in the tub or shower. If you need to sit down in the shower, use a plastic, non-slip stool. Keep the floor dry. Clean up any water that spills on the floor as soon as it happens. Remove soap buildup in the tub or shower regularly. Attach bath mats securely with double-sided non-slip rug tape. Do not have throw rugs and other things on the floor that can make you trip. What can I do in the bedroom? Use night  lights. Make sure that you have a light by your bed that is easy to reach. Do not use any sheets or blankets that are too big for your bed. They should not hang down onto the floor. Have a firm chair that has side arms. You can use this for support while you get dressed. Do not have throw rugs and other things on the floor that can make you trip. What can I do in the kitchen? Clean up any spills right away. Avoid walking on wet floors. Keep items that you use a lot in easy-to-reach places. If you need to reach something above you, use a strong step stool that has a grab bar. Keep electrical cords out of the way. Do not use floor polish or wax that makes floors slippery. If you must use wax, use non-skid floor wax. Do not have throw rugs and other things on the floor that can make you trip. What can I do with my stairs? Do not leave any items on the stairs. Make sure that there are handrails on both sides of the stairs and use them. Fix handrails that are broken or loose. Make sure that handrails are as long as the stairways. Check any carpeting to make sure that it is firmly attached to the stairs. Fix any carpet that is loose or worn. Avoid having throw rugs at the top or bottom  of the stairs. If you do have throw rugs, attach them to the floor with carpet tape. Make sure that you have a light switch at the top of the stairs and the bottom of the stairs. If you do not have them, ask someone to add them for you. What else can I do to help prevent falls? Wear shoes that: Do not have high heels. Have rubber bottoms. Are comfortable and fit you well. Are closed at the toe. Do not wear sandals. If you use a stepladder: Make sure that it is fully opened. Do not climb a closed stepladder. Make sure that both sides of the stepladder are locked into place. Ask someone to hold it for you, if possible. Clearly mark and make sure that you can see: Any grab bars or handrails. First and last  steps. Where the edge of each step is. Use tools that help you move around (mobility aids) if they are needed. These include: Canes. Walkers. Scooters. Crutches. Turn on the lights when you go into a dark area. Replace any light bulbs as soon as they burn out. Set up your furniture so you have a clear path. Avoid moving your furniture around. If any of your floors are uneven, fix them. If there are any pets around you, be aware of where they are. Review your medicines with your doctor. Some medicines can make you feel dizzy. This can increase your chance of falling. Ask your doctor what other things that you can do to help prevent falls. This information is not intended to replace advice given to you by your health care provider. Make sure you discuss any questions you have with your health care provider. Document Released: 10/21/2008 Document Revised: 06/02/2015 Document Reviewed: 01/29/2014 Elsevier Interactive Patient Education  2017 [employer].

## 2021-07-06 NOTE — Progress Notes (Signed)
Virtual Visit via Telephone Note  I connected with  Joan Mclaughlin on 07/06/21 at  9:45 AM EDT by telephone and verified that I am speaking with the correct person using two identifiers.  Medicare Annual Wellness visit completed telephonically due to Covid-19 pandemic.   Persons participating in this call: This Health Coach and this patient.   Location: Patient: Home Provider: office   I discussed the limitations, risks, security and privacy concerns of performing an evaluation and management service by telephone and the availability of in person appointments. The patient expressed understanding and agreed to proceed.  Unable to perform video visit due to video visit attempted and failed and/or patient does not have video capability.   Some vital signs may be absent or patient reported.   Willette Brace, LPN   Subjective:   Joan Mclaughlin is a 81 y.o. female who presents for Medicare Annual (Subsequent) preventive examination.  Review of Systems     Cardiac Risk Factors include: advanced age (>75mn, >>60women);dyslipidemia;hypertension     Objective:    There were no vitals filed for this visit. There is no height or weight on file to calculate BMI.     07/06/2021    9:55 AM 11/22/2020    2:35 PM 08/31/2020    7:00 AM 07/01/2020    3:29 PM 03/10/2020    2:02 PM 02/26/2020    3:36 PM 11/02/2019    8:11 AM  Advanced Directives  Does Patient Have a Medical Advance Directive? Yes Yes No Yes Yes Yes Yes  Type of ASpecial educational needs teacherPower of AKeoof AAbramof AWheatonin Chart? Yes - validated most recent copy scanned in chart (See row information)   Yes - validated most recent copy scanned in chart (See row information) Yes - validated most recent copy scanned in chart (See row information)   No - copy requested  Would patient like information on creating a medical advance directive?   No - Patient declined        Current Medications (verified) Outpatient Encounter Medications as of 07/06/2021  Medication Sig   acetic acid-hydrocortisone (VOSOL-HC) OTIC solution Place 3 drops into both ears 2 (two) times daily.   ALPRAZolam (XANAX) 1 MG tablet TAKE 1/2 TABLET BY MOUTH EVERY DAY AT BEDTIME   amLODipine (NORVASC) 5 MG tablet TAKE 1 TABLET BY MOUTH EVERY DAY   Ascorbic Acid (VITAMIN C) 1000 MG tablet Take 1,000 mg by mouth daily.   b complex vitamins capsule Take 1 capsule by mouth daily.   CALCIUM-MAGNESIUM PO Take 1 tablet by mouth daily. 1000 mg / 500 mg   Cholecalciferol (VITAMIN D3) 50 MCG (2000 UT) capsule Take 2,000 Units by mouth daily.   hydrocortisone 2.5 % cream Apply topically 2 (two) times daily as needed (Rash). (Patient taking differently: Apply 1 application  topically 2 (two) times daily as needed (Rosacea).)   hydrOXYzine (ATARAX/VISTARIL) 10 MG tablet TAKE 1 TO 2 TABLETS BY MOUTH AT BEDTIME   ketoconazole (NIZORAL) 2 % shampoo USE AS DIRECTED   Lysine 500 MG TABS Take 500 mg by mouth daily.   MELATONIN PO Take 1 capsule by mouth at bedtime.   mometasone (ELOCON) 0.1 % lotion Apply in affected ear 3 times daily.   Multiple Vitamin (MULTIVITAMIN WITH MINERALS) TABS tablet Take 1 tablet by mouth daily.  Multiple Vitamins-Minerals (ZINC PO) Take 90 mg by mouth daily.   Omega-3 Fatty Acids (FISH OIL PO) Take 630 mg by mouth daily.   Oxcarbazepine (TRILEPTAL) 300 MG tablet Take 1 tablet ('300mg'$ ) by mouth twice daily   Potassium 99 MG TABS Take 99 mg by mouth daily.   Probiotic Product (PROBIOTIC DAILY) CAPS Take 1 capsule by mouth daily. Woman   Red Yeast Rice 600 MG CAPS Take 1,200 mg by mouth at bedtime. With Co-Q-10 100 mg   selenium 200 MCG TABS tablet Take 200 mcg by mouth daily.   triamcinolone cream (KENALOG) 0.1 % Apply 1 application topically daily. At night  for Rosacea   TURMERIC PO Take 600 mg by mouth daily.   valACYclovir (VALTREX) 500 MG tablet TAKE 1 TABLET (500 MG TOTAL) BY MOUTH DAILY AS NEEDED (COLD SORES).   [DISCONTINUED] azithromycin (ZITHROMAX) 250 MG tablet Take 2 tabs day 1, then 1 tab daily   No facility-administered encounter medications on file as of 07/06/2021.    Allergies (verified) Amoxicillin-pot clavulanate and Suvorexant   History: Past Medical History:  Diagnosis Date   Alcoholism ([place])    sober for 56 years-noted 09/16/2018   Allergy    Anxiety    Cancer ([place])    skin cancer   Cholecystitis    Complication of anesthesia    Difficulty sleeping    takes med to sleep   Diverticulitis    Family history of adverse reaction to anesthesia    SIster - N/V   Gallstones    Ganglion cyst 12/11/2016   GERD (gastroesophageal reflux disease)    "RELATED TO GALLBLADDER"   Heart murmur    "ONLY DETECTED WHEN LYING DOWN"  - 10/16/2018- Not concerned with   History of Lyme disease 09/19/2016   Hyperlipidemia    Hypertension    Lyme disease    Pt states she has had Lyme Disease twice.    Nocturia    Pneumonia    PONV (postoperative nausea and vomiting)    Nausea   S/P laparoscopic cholecystectomy 12/23/2014   Seizure disorder ([place])    Seizures ([place])    Seizures ([place])    "last one 10- 15 years ago - 10/16/2018   Squamous cell carcinoma of skin 12/25/2018   RIGHT FOREARM    Squamous cell carcinoma of skin 09/30/2019   in situ-left lower leg-anterior   Trimalleolar fracture    left   Tubular adenoma of colon 06/27/2018   Past Surgical History:  Procedure Laterality Date   CHOLECYSTECTOMY N/A 12/23/2014   Procedure: LAPAROSCOPIC CHOLECYSTECTOMY WITH INTRAOPERATIVE CHOLANGIOGRAM;  Surgeon: Greer Pickerel, MD;  Location: [facility];  Service: General;  Laterality: N/A;   COLONOSCOPY     "3 times trying to get large polyp since August 2020"   COLONOSCOPY WITH PROPOFOL N/A 10/20/2018   Procedure: COLONOSCOPY WITH PROPOFOL;   Surgeon: Irving Copas., MD;  Location: [place];  Service: Gastroenterology;  Laterality: N/A;   COLONOSCOPY WITH PROPOFOL N/A 11/02/2019   Procedure: COLONOSCOPY WITH PROPOFOL;  Surgeon: Rush Landmark Telford Nab., MD;  Location: [place];  Service: Gastroenterology;  Laterality: N/A;   COLONOSCOPY WITH PROPOFOL N/A 08/31/2020   Procedure: COLONOSCOPY WITH PROPOFOL;  Surgeon: Rush Landmark Telford Nab., MD;  [facility];  Service: Gastroenterology;  Laterality: N/A;   ENDOSCOPIC MUCOSAL RESECTION N/A 10/20/2018   Procedure: ENDOSCOPIC MUCOSAL RESECTION;  Surgeon: Rush Landmark Telford Nab., MD;  Location: [place];  Service: Gastroenterology;  Laterality: N/A;   ENDOSCOPIC MUCOSAL RESECTION  11/02/2019  Procedure: ENDOSCOPIC MUCOSAL RESECTION;  Surgeon: Rush Landmark Telford Nab., MD;  Location: [place];  Service: Gastroenterology;;   HEMOSTASIS CLIP PLACEMENT  10/20/2018   Procedure: HEMOSTASIS CLIP PLACEMENT;  Surgeon: Irving Copas., MD;  Location: [place];  Service: Gastroenterology;;   HEMOSTASIS CLIP PLACEMENT  11/02/2019   Procedure: HEMOSTASIS CLIP PLACEMENT;  Surgeon: Irving Copas., MD;  Location: [place];  Service: Gastroenterology;;   ORIF ANKLE FRACTURE Left 02/19/2019   Procedure: OPEN REDUCTION INTERNAL FIXATION (ORIF) LEFT ANKLE FRACTURE;  Surgeon: Erle Crocker, MD;  Location: [place];  Service: Orthopedics;  Laterality: Left;   POLYPECTOMY  10/20/2018   Procedure: POLYPECTOMY;  Surgeon: Mansouraty, Telford Nab., MD;  Location: [place];  Service: Gastroenterology;;   POLYPECTOMY  11/02/2019   Procedure: POLYPECTOMY;  Surgeon: Irving Copas., MD;  Location: [place];  Service: Gastroenterology;;   POLYPECTOMY  08/31/2020   Procedure: POLYPECTOMY;  Surgeon: Irving Copas., MD;  Location: [facility];  Service: Gastroenterology;;   [place] INJECTION  10/20/2018   Procedure: SUBMUCOSAL  LIFTING INJECTION;  Surgeon: Irving Copas., MD;  Location: [place];  Service: Gastroenterology;;   SYNDESMOSIS REPAIR Left 02/19/2019   Procedure: SYNDESMOSIS REPAIR;  Surgeon: Erle Crocker, MD;  Location: [place];  Service: Orthopedics;  Laterality: Left;   THORACIC DISCECTOMY  07/2007   T12   TUBAL LIGATION     WISDOM TOOTH EXTRACTION     Family History  Problem Relation Age of Onset   Arthritis Father    Alcohol abuse Mother        drinker and smoker   Diabetes Mother    Heart disease Mother    Hypertension Mother    Stroke Mother    Depression Sister    Hearing loss Sister    Hyperlipidemia Sister    Alcohol abuse Daughter    Alcohol abuse Son    Depression Sister    Early death Sister    Alcohol abuse Brother    Depression Sister    Heart disease Sister    Colon cancer Neg Hx    Esophageal cancer Neg Hx    Inflammatory bowel disease Neg Hx    Liver disease Neg Hx    Pancreatic cancer Neg Hx    Rectal cancer Neg Hx    Stomach cancer Neg Hx    Social History   Socioeconomic History   Marital status: Single    Spouse name: Not on file   Number of children: 4   Years of education: 14   Highest education level: Not on file  Occupational History   Occupation: retired    Comment: Equities trader payable and nanny   Tobacco Use   Smoking status: Never   Smokeless tobacco: Never  Vaping Use   Vaping Use: Never used  Substance and Sexual Activity   Alcohol use: No    Alcohol/week: 0.0 standard drinks of alcohol    Comment: sober for 72 years-noted 09/16/2018   Drug use: No   Sexual activity: Not Currently  Other Topics Concern   Not on file  Social History Narrative   Patient lives alone.   patient has 4 grandchildren.   Patient is retired   Patient has 2 years of college.   Patient is right handed.         Social Determinants of Health   Financial Resource Strain: Low Risk  (07/06/2021)   Overall Financial Resource Strain (CARDIA)     Difficulty of Paying Living Expenses:  Not hard at all  Food Insecurity: No Food Insecurity (07/06/2021)   Hunger Vital Sign    Worried About Running Out of Food in the Last Year: Never true    Ran Out of Food in the Last Year: Never true  Transportation Needs: No Transportation Needs (07/06/2021)   PRAPARE - Hydrologist (Medical): No    Lack of Transportation (Non-Medical): No  Physical Activity: Insufficiently Active (07/06/2021)   Exercise Vital Sign    Days of Exercise per Week: 2 days    Minutes of Exercise per Session: 60 min  Stress: No Stress Concern Present (07/06/2021)   [place]    Feeling of Stress : Not at all  Social Connections: Moderately Integrated (07/06/2021)   Social Connection and Isolation Panel [NHANES]    Frequency of Communication with Friends and Family: More than three times a week    Frequency of Social Gatherings with Friends and Family: More than three times a week    Attends Religious Services: More than 4 times per year    Active Member of [employer] or Organizations: Yes    Attends Archivist Meetings: 1 to 4 times per year    Marital Status: Never married    Tobacco Counseling Counseling given: Not Answered   Clinical Intake:  Pre-visit preparation completed: Yes  Pain : No/denies pain     BMI - recorded: 26.16 Nutritional Status: BMI 25 -29 Overweight Nutritional Risks: None Diabetes: No  How often do you need to have someone help you when you read instructions, pamphlets, or other written materials from your doctor or pharmacy?: 1 - Never  Diabetic?no  Interpreter Needed?: No  Information entered by :: Charlott Rakes, LPN   Activities of Daily Living    07/06/2021    9:57 AM  In your present state of health, do you have any difficulty performing the following activities:  Hearing? 0  Vision? 0  Difficulty concentrating or making  decisions? 0  Walking or climbing stairs? 0  Dressing or bathing? 0  Doing errands, shopping? 0  Preparing Food and eating ? N  Using the Toilet? N  In the past six months, have you accidently leaked urine? N  Do you have problems with loss of bowel control? N  Managing your Medications? N  Managing your Finances? N  Housekeeping or managing your Housekeeping? N    Patient Care Team: Vivi Barrack, MD as PCP - General (Family Medicine) Marilynne Halsted, MD as Referring Physician (Ophthalmology) Laurence Spates, MD (Inactive) as Consulting Physician (Gastroenterology) Warren Danes, PA-C as Physician Assistant (Dermatology) Cameron Sprang, MD as Consulting Physician (Neurology) Edrick Kins, DPM as Consulting Physician (Podiatry) Mershon, [place], PA-C (Inactive) (Dermatology) Edythe Clarity, [facility] (Pharmacist)  Indicate any recent Medical Services you may have received from other than [facility] providers in the past year (date may be approximate).     Assessment:   This is a routine wellness examination for Joan Mclaughlin.  Hearing/Vision screen Hearing Screening - Comments:: Pt denies any hearing issues  Vision Screening - Comments:: Pt will follow up with provider   Dietary issues and exercise activities discussed: Current Exercise Habits: Structured exercise class, Type of exercise: Other - see comments (works with a Clinical research associate), Time (Minutes): 60, Frequency (Times/Week): 2, Weekly Exercise (Minutes/Week): 120   Goals Addressed             This Visit's Progress  Patient Stated       Lose 7- 10 lbs        Depression Screen    07/06/2021    9:55 AM 06/07/2021   11:27 AM 05/04/2021    1:37 PM 07/01/2020    3:28 PM 06/30/2020   10:23 AM 05/24/2020   11:32 AM 04/27/2020    7:57 AM  PHQ 2/9 Scores  PHQ - 2 Score 0 0 0 0 0 0 0  PHQ- 9 Score     0 0     Fall Risk    07/06/2021    9:57 AM 06/07/2021   11:27 AM 05/04/2021    1:37 PM 11/22/2020    2:34 PM  07/01/2020    3:30 PM  Fall Risk   Falls in the past year? 0 0  0 0  Number falls in past yr: 0 0 0 0 0  Injury with Fall? 0 0 0 0 0  Risk for fall due to : Impaired vision No Fall Risks No Fall Risks  Impaired vision  Follow up Falls prevention discussed    Falls prevention discussed    FALL RISK PREVENTION PERTAINING TO THE HOME:  Any stairs in or around the home? Yes  If so, are there any without handrails? No  Home free of loose throw rugs in walkways, pet beds, electrical cords, etc? Yes  Adequate lighting in your home to reduce risk of falls? Yes   ASSISTIVE DEVICES UTILIZED TO PREVENT FALLS:  Life alert? No  Use of a cane, walker or w/c? No  Grab bars in the bathroom? Yes  Shower chair or bench in shower? No  Elevated toilet seat or a handicapped toilet? No   TIMED UP AND GO:  Was the test performed? No .   Cognitive Function:        07/06/2021    9:58 AM 07/01/2020    3:33 PM  6CIT Screen  What Year? 0 points 0 points  What month? 0 points 0 points  What time? 0 points 0 points  Count back from 20 0 points 0 points  Months in reverse 0 points 0 points  Repeat phrase 0 points 0 points  Total Score 0 points 0 points    Immunizations Immunization History  Administered Date(s) Administered   Influenza-Unspecified 10/22/2017   Moderna Sars-Covid-2 Vaccination 12/16/2019, 01/13/2020   Pneumococcal Conjugate-13 05/16/2016   Pneumococcal Polysaccharide-23 06/25/2013   Tdap 10/22/2017   Zoster Recombinat (Shingrix) 03/24/2017, 07/23/2017    TDAP status: Up to date  Flu Vaccine status: Declined, Education has been provided regarding the importance of this vaccine but patient still declined. Advised may receive this vaccine at local pharmacy or Health Dept. Aware to provide a copy of the vaccination record if obtained from local pharmacy or Health Dept. Verbalized acceptance and understanding.  Pneumococcal vaccine status: Up to date  Covid-19 vaccine status:  Completed vaccines  Qualifies for Shingles Vaccine? Yes   Zostavax completed Yes   Shingrix Completed?: Yes  Screening Tests Health Maintenance  Topic Date Due   Pneumonia Vaccine 32+ Years old  Completed   Zoster Vaccines- Shingrix  Completed   HPV VACCINES  Aged Out   INFLUENZA VACCINE  Discontinued   DEXA SCAN  Discontinued   TETANUS/TDAP  Discontinued   COVID-19 Vaccine  Discontinued    Health Maintenance  There are no preventive care reminders to display for this patient.  Colorectal cancer screening: No longer required.   Mammogram status: Completed 03/04/18.  Repeat every year     Additional Screening:  Vision Screening: Recommended annual ophthalmology exams for early detection of glaucoma and other disorders of the eye. Is the patient up to date with their annual eye exam?  No  Who is the provider or what is the name of the office in which the patient attends annual eye exams? Will get a new provider  If pt is not established with a provider, would they like to be referred to a provider to establish care? No .   Dental Screening: Recommended annual dental exams for proper oral hygiene  Community Resource Referral / Chronic Care Management: CRR required this visit?  No   CCM required this visit?  No      Plan:     I have personally reviewed and noted the following in the patient's chart:   Medical and social history Use of alcohol, tobacco or illicit drugs  Current medications and supplements including opioid prescriptions.  Functional ability and status Nutritional status Physical activity Advanced directives List of other physicians Hospitalizations, surgeries, and ER visits in previous 12 months Vitals Screenings to include cognitive, depression, and falls Referrals and appointments  In addition, I have reviewed and discussed with patient certain preventive protocols, quality metrics, and best practice recommendations. A written personalized  care plan for preventive services as well as general preventive health recommendations were provided to patient.     Willette Brace, LPN   09[birth year]   Nurse Notes: None

## 2021-07-07 ENCOUNTER — Telehealth: Payer: Self-pay | Admitting: *Deleted

## 2021-07-07 NOTE — Chronic Care Management (AMB) (Signed)
  Chronic Care Management Note  07/07/2021 Name: Joan Mclaughlin MRN: [id number] DOB: [birth year]  Joan Mclaughlin is a 81 y.o. year old female who is a primary care patient of Vivi Barrack, MD and is actively engaged with the care management team. I reached out to Melany Guernsey by phone today to assist with re-scheduling a follow up visit with the Pharmacist  Follow up plan: Patient declines further follow up and engagement by the care management team. Appropriate care team members and provider have been notified via electronic communication. Pt no longer needs pharmacy services at this time   Julian Hy, [place]: [phone]

## 2021-07-11 ENCOUNTER — Telehealth: Payer: Medicare Other

## 2021-08-03 ENCOUNTER — Other Ambulatory Visit: Payer: Self-pay | Admitting: Family Medicine

## 2021-08-11 ENCOUNTER — Ambulatory Visit (INDEPENDENT_AMBULATORY_CARE_PROVIDER_SITE_OTHER): Payer: Medicare Other | Admitting: Family Medicine

## 2021-08-11 ENCOUNTER — Encounter: Payer: Self-pay | Admitting: Family Medicine

## 2021-08-11 VITALS — BP 139/74 | HR 75 | Temp 97.9°F | Ht 62.0 in | Wt 139.6 lb

## 2021-08-11 DIAGNOSIS — L0390 Cellulitis, unspecified: Secondary | ICD-10-CM | POA: Diagnosis not present

## 2021-08-11 DIAGNOSIS — F439 Reaction to severe stress, unspecified: Secondary | ICD-10-CM | POA: Diagnosis not present

## 2021-08-11 DIAGNOSIS — I10 Essential (primary) hypertension: Secondary | ICD-10-CM | POA: Diagnosis not present

## 2021-08-11 MED ORDER — DOXYCYCLINE HYCLATE 100 MG PO TABS: 100.0000 mg | ORAL_TABLET | Freq: Two times a day (BID) | ORAL | 0 refills | Status: DC

## 2021-08-11 NOTE — Patient Instructions (Signed)
It was very nice to see you today!  Please start the doxycycline.  Please keep the area clean and dry for the next few days.  The Steri-Strips we placed should follow-up within the next 5 to 7 days.  Please let us know if it does not heal or if you have any worsening signs of infection.  Take care, Dr [name]  PLEASE NOTE:  If you had any lab tests please let us know if you have not heard back within a few days. You may see your results on mychart before we have a chance to review them but we will give you a call once they are reviewed by [place]. If we ordered any referrals today, please let us know if you have not heard from their office within the next week.   Please try these tips to maintain a healthy lifestyle:  Eat at least 3 REAL meals and 1-2 snacks per day.  Aim for no more than 5 hours between eating.  If you eat breakfast, please do so within one hour of getting up.   Each meal should contain half fruits/vegetables, one quarter protein, and one quarter carbs (no bigger than a computer mouse)  Cut down on sweet beverages. This includes juice, soda, and sweet tea.   Drink at least 1 glass of water with each meal and aim for at least 8 glasses per day  Exercise at least 150 minutes every week.

## 2021-08-11 NOTE — Assessment & Plan Note (Signed)
Continue alprazolam 0.5 mg daily as needed.

## 2021-08-11 NOTE — Progress Notes (Signed)
   Joan Mclaughlin is a 81 y.o. female who presents today for an office visit.  Assessment/Plan:  New/Acute Problems: Laceration  Concern for small amount of surrounding cellulitis.  We will start doxycycline.  She is well beyond the window for primary closure at this point.  We placed Steri-Strips with topical antibiotic ointment.  Instructed patient to keep.  Clean and dry.  She can routinely use topical antibiotic ointment as needed.  She will let us know if the wound is not improving over the next couple of weeks or if she has any worsening signs for infection.  Chronic Problems Addressed Today: Stress Continue alprazolam 0.5 mg daily as needed.  Essential hypertension Blood pressure is at goal on amlodipine 5 mg daily.     Subjective:  HPI:  Patient here with wound on right forearm. Started 2 weeks ago.  Scraped against a tree branch.  Initially had some bleeding.  She tried keeping the area clean but area has not improved.  No drainage.  She has some tenderness.  Some redness.       Objective:  Physical Exam: BP 139/74   Pulse 75   Temp 97.9 F (36.6 C) (Temporal)   Ht '5\' 2"'$  (1.575 m)   Wt 139 lb 9.6 oz (63.3 kg)   SpO2 97%   BMI 25.53 kg/m   Gen: No acute distress, resting comfortably Skin: Approximately 1.5 to 2 cm laceration on right lateral forearm with surrounding erythema.  No purulence.  No drainage.  Tenderness to palpation. Neuro: Grossly normal, moves all extremities Psych: Normal affect and thought content      Jamie Hafford M. [name], MD 08/11/2021 3:32 PM

## 2021-08-11 NOTE — Assessment & Plan Note (Signed)
Blood pressure is at goal on amlodipine 5 mg daily.

## 2021-08-25 ENCOUNTER — Other Ambulatory Visit: Payer: Self-pay | Admitting: Family Medicine

## 2021-08-26 ENCOUNTER — Other Ambulatory Visit: Payer: Self-pay | Admitting: Dermatology

## 2021-09-01 ENCOUNTER — Other Ambulatory Visit: Payer: Self-pay | Admitting: Family Medicine

## 2021-09-04 ENCOUNTER — Telehealth: Payer: Self-pay | Admitting: Family Medicine

## 2021-09-04 NOTE — Telephone Encounter (Signed)
Please advise 

## 2021-09-04 NOTE — Telephone Encounter (Signed)
Patient requests: -PCP take over management of hydroxyzine 10 mg from former dermatologist, Dr. Denna Haggard.  - A refill of the medication be sent in since she wasn't able to fill this prior to dermatology office closing    LAST APPOINTMENT DATE:  08/11/21  NEXT APPOINTMENT DATE: 07/19/22  MEDICATION:hydrOXYzine (ATARAX/VISTARIL) 10 MG tablet  Is the patient out of medication? Has a couple days worth left  PHARMACY: [id number] [place], [place] [address] [id number] Phone:  [phone] Fax:  [phone] DEA #:  [id number]

## 2021-09-05 ENCOUNTER — Other Ambulatory Visit: Payer: Self-pay | Admitting: *Deleted

## 2021-09-05 MED ORDER — HYDROXYZINE HCL 10 MG PO TABS
10.0000 mg | ORAL_TABLET | Freq: Every day | ORAL | 2 refills | Status: DC
Start: 2021-09-05 — End: 2022-05-14

## 2021-09-05 NOTE — Telephone Encounter (Signed)
Ok with me. Please place any necessary orders. 

## 2021-09-05 NOTE — Telephone Encounter (Signed)
Rx send to pharmacy  

## 2021-09-19 ENCOUNTER — Ambulatory Visit (INDEPENDENT_AMBULATORY_CARE_PROVIDER_SITE_OTHER): Payer: Medicare Other | Admitting: Family Medicine

## 2021-09-19 ENCOUNTER — Encounter: Payer: Self-pay | Admitting: Family Medicine

## 2021-09-19 VITALS — BP 134/87 | HR 71 | Temp 98.1°F | Ht 62.0 in | Wt 138.4 lb

## 2021-09-19 DIAGNOSIS — I10 Essential (primary) hypertension: Secondary | ICD-10-CM

## 2021-09-19 DIAGNOSIS — G4700 Insomnia, unspecified: Secondary | ICD-10-CM

## 2021-09-19 DIAGNOSIS — F439 Reaction to severe stress, unspecified: Secondary | ICD-10-CM

## 2021-09-19 MED ORDER — DOXYCYCLINE HYCLATE 100 MG PO TABS: 100.0000 mg | ORAL_TABLET | Freq: Two times a day (BID) | ORAL | 0 refills | Status: DC

## 2021-09-19 NOTE — Assessment & Plan Note (Addendum)
At goal on amlodipine 5 mg daily. 

## 2021-09-19 NOTE — Assessment & Plan Note (Signed)
Doing well with alprazolam 0.5 nightly as needed.

## 2021-09-19 NOTE — Patient Instructions (Signed)
It was very nice to see you today!  Please keep your area clean and dry until it scabs over.  Let me know if you have any signs of infection.  Please start the antibiotic if this happens.  Take care, Dr [name]  PLEASE NOTE:  If you had any lab tests please let us know if you have not heard back within a few days. You may see your results on mychart before we have a chance to review them but we will give you a call once they are reviewed by [place]. If we ordered any referrals today, please let us know if you have not heard from their office within the next week.   Please try these tips to maintain a healthy lifestyle:  Eat at least 3 REAL meals and 1-2 snacks per day.  Aim for no more than 5 hours between eating.  If you eat breakfast, please do so within one hour of getting up.   Each meal should contain half fruits/vegetables, one quarter protein, and one quarter carbs (no bigger than a computer mouse)  Cut down on sweet beverages. This includes juice, soda, and sweet tea.   Drink at least 1 glass of water with each meal and aim for at least 8 glasses per day  Exercise at least 150 minutes every week.

## 2021-09-19 NOTE — Progress Notes (Signed)
   Joan Mclaughlin is a 81 y.o. female who presents today for an office visit.  Assessment/Plan:  New/Acute Problems: Skin tear No red flags.  She does have surrounding irritation likely due to the adhesive that she had been using.  Steri-Strips were placed today.  Sterile bandages were placed with bacitracin.  Instructed patient to keep the area clean and dry until healed over the next several days.  We will send in pocket prescription for doxycycline with instruction to not use unless she develops signs of cellulitis over the next several days.  We discussed reasons to return to care.  Follow-up as needed.  Chronic Problems Addressed Today: Insomnia Doing well on current regimen of melatonin 3 mg nightly, Remeron 7.5 mg nightly and hydroxyzine as needed.  Stress Doing well with alprazolam 0.5 nightly as needed.  Essential hypertension At goal on amlodipine 5 mg daily.     Subjective:  HPI:  PAtient here with skin tear. Happened 4 days ago while cleaning up in her kitchen after spilling some vegetables on the floor.  She has noticed increasing redness around the skin tear.  No fever or chills no purulent discharge.  She has been using paper tape to the area to keep bandage in place but she thinks this may have caused irritation.  No pain.       Objective:  Physical Exam: BP 134/87   Pulse 71   Temp 98.1 F (36.7 C) (Temporal)   Ht '5\' 2"'$  (1.575 m)   Wt 138 lb 6.4 oz (62.8 kg)   SpO2 98%   BMI 25.31 kg/m   Gen: No acute distress, resting comfortably CV: Regular rate and rhythm with no murmurs appreciated Pulm: Normal work of breathing, clear to auscultation bilaterally with no crackles, wheezes, or rhonchi Skin: Approximately 1 cm skin tear on dorsal left forearm.  Small amount of surrounding erythema. Neuro: Grossly normal, moves all extremities Psych: Normal affect and thought content      Joan Mclaughlin M. [name], MD 09/19/2021 12:55 PM

## 2021-09-19 NOTE — Assessment & Plan Note (Signed)
Doing well on current regimen of melatonin 3 mg nightly, Remeron 7.5 mg nightly and hydroxyzine as needed.

## 2021-12-08 ENCOUNTER — Encounter: Payer: Self-pay | Admitting: Family Medicine

## 2021-12-08 ENCOUNTER — Ambulatory Visit (INDEPENDENT_AMBULATORY_CARE_PROVIDER_SITE_OTHER): Payer: Medicare Other | Admitting: Family Medicine

## 2021-12-08 VITALS — BP 133/81 | HR 67 | Temp 98.0°F | Ht 62.0 in | Wt 138.6 lb

## 2021-12-08 DIAGNOSIS — F439 Reaction to severe stress, unspecified: Secondary | ICD-10-CM | POA: Diagnosis not present

## 2021-12-08 DIAGNOSIS — L0390 Cellulitis, unspecified: Secondary | ICD-10-CM | POA: Diagnosis not present

## 2021-12-08 DIAGNOSIS — I10 Essential (primary) hypertension: Secondary | ICD-10-CM | POA: Diagnosis not present

## 2021-12-08 MED ORDER — DOXYCYCLINE HYCLATE 100 MG PO TABS: 100.0000 mg | ORAL_TABLET | Freq: Two times a day (BID) | ORAL | 0 refills | Status: DC

## 2021-12-08 NOTE — Patient Instructions (Signed)
It was very nice to see you today!  You have an infected skin tear.  Please start the doxycycline.  Please keep the area clean.  You can apply antibiotic ointment twice daily.  We did place Steri-Strips today.  They should fall off of the next 5 to 7 days.  It may take a couple of weeks for your wound to fully heal.  Please let us know if your signs of infection are not improving or if your pain is worsening.  Take care, Dr [name]  PLEASE NOTE:  If you had any lab tests please let us know if you have not heard back within a few days. You may see your results on mychart before we have a chance to review them but we will give you a call once they are reviewed by [place]. If we ordered any referrals today, please let us know if you have not heard from their office within the next week.   Please try these tips to maintain a healthy lifestyle:  Eat at least 3 REAL meals and 1-2 snacks per day.  Aim for no more than 5 hours between eating.  If you eat breakfast, please do so within one hour of getting up.   Each meal should contain half fruits/vegetables, one quarter protein, and one quarter carbs (no bigger than a computer mouse)  Cut down on sweet beverages. This includes juice, soda, and sweet tea.   Drink at least 1 glass of water with each meal and aim for at least 8 glasses per day  Exercise at least 150 minutes every week.

## 2021-12-08 NOTE — Progress Notes (Signed)
   Joan Mclaughlin is a 81 y.o. female who presents today for an office visit.  Assessment/Plan:  New/Acute Problems: Cellulitis No red flags.  No signs of systemic illness.  We will start doxycycline.  Wound care was discussed as below.  We discussed reasons to return to care.  Skin tear Discussed healing process with patient.  Steri-Strips were placed today and topical antibiotic ointment and dressing was applied.  Patient was advised to stop using alcohol and hydrogen peroxide.  Recommended she use topical antibiotic ointment twice daily and continue with sterile dressing changes. Advised patient may take several weeks for this to fully heal.  We discussed reasons to return to care and seek emergent care.  Chronic Problems Addressed Today: Essential hypertension At goal on amlodipine 5 mg daily.  Stress Stable on alprazolam 0.5 mg daily as needed.     Subjective:  HPI:  See A/P for status of chronic conditions.  Patient is here today with skin tear on right ankle.  Started a week ago.  She dropped a bottle with a metal cap on her right ankle.  Had immediate pain and bleeding to the area.  She does note that the skin peeled off.  She was managing this at home with frequent alcohol and hydrogen peroxide rinses as well as using topical antibiotics.  She was doing well however the last day or so she has noticed more pain and redness to the area.  No fevers or chills.  No purulent drainage.       Objective:  Physical Exam: BP 133/81   Pulse 67   Temp 98 F (36.7 C) (Temporal)   Ht '5\' 2"'$  (1.575 m)   Wt 138 lb 9.6 oz (62.9 kg)   SpO2 97%   BMI 25.35 kg/m   Gen: No acute distress, resting comfortably Skin: Approximately 0.5 x 2 cm skin tear on right lateral malleoli.  Surrounding erythema.  Tenderness palpation noted. Neuro: Grossly normal, moves all extremities Psych: Normal affect and thought content      Adal Sereno M. [name], MD 12/08/2021 12:27 PM

## 2021-12-08 NOTE — Assessment & Plan Note (Signed)
At goal on amlodipine 5 mg daily.

## 2021-12-08 NOTE — Assessment & Plan Note (Signed)
Stable on alprazolam 0.5 mg daily as needed.

## 2021-12-09 DIAGNOSIS — L239 Allergic contact dermatitis, unspecified cause: Secondary | ICD-10-CM | POA: Diagnosis not present

## 2021-12-09 DIAGNOSIS — L0390 Cellulitis, unspecified: Secondary | ICD-10-CM | POA: Diagnosis not present

## 2021-12-11 ENCOUNTER — Telehealth: Payer: Self-pay | Admitting: Family Medicine

## 2021-12-11 NOTE — Telephone Encounter (Signed)
Pt states: -Broke out with red, itchy bumps after starting Doxycycline. -[facility] prescribed Prednisone to help with itching from reaction. -[facility] prescribed Cephalexin for alternative antibiotic.  Pt Requests: -Records to be updated with allergy and current medicaitons.

## 2021-12-11 NOTE — Telephone Encounter (Signed)
Pt has a possible allergic reaction. Access nurse unable to reach pt.   Patient Name: Joan Mclaughlin Gender: Female DOB: 1940-02-15 Age: 81 Y 53 M 14 D Return Phone Number: [phone] (Primary) Address: City/ State/ Zip: [place]  [address] [facility] Client [facility] Night Provider Dimas Chyle- MD Contact Type Call Who Is Calling Patient / Member / Family / Caregiver Call Type Triage / Clinical Relationship To Patient Self Return Phone Number [phone] (Primary) Chief Complaint Medication reaction Reason for Call Symptomatic / Request for [name] is experiencing an allergic reaction from the medication, she is now itching. Translation No Disp. Time Eilene Ghazi Time) Disposition Final User 12/09/2021 11:05:32 AM Attempt made - message left Roe Rutherford 12/09/2021 11:17:53 AM FINAL ATTEMPT MADE - message left Yes Marcelline Deist RN, Mickel Baas Final Disposition 12/09/2021 11:17:53 AM FINAL ATTEMPT MADE - message left Yes Marcelline Deist, RN, Mickel Baas

## 2021-12-12 NOTE — Telephone Encounter (Signed)
Appreciate the update. Would like for her to let us know if her symptoms are not improving.  [name]. [name], MD 12/12/2021 7:47 AM

## 2021-12-12 NOTE — Telephone Encounter (Signed)
Doxycycline added to allergy list

## 2021-12-17 ENCOUNTER — Other Ambulatory Visit: Payer: Self-pay | Admitting: Family Medicine

## 2022-01-12 ENCOUNTER — Ambulatory Visit (INDEPENDENT_AMBULATORY_CARE_PROVIDER_SITE_OTHER): Payer: Medicare Other | Admitting: Family Medicine

## 2022-01-12 ENCOUNTER — Encounter: Payer: Self-pay | Admitting: Family Medicine

## 2022-01-12 VITALS — BP 120/64 | HR 71 | Temp 97.9°F | Ht 62.0 in | Wt 138.0 lb

## 2022-01-12 DIAGNOSIS — K5792 Diverticulitis of intestine, part unspecified, without perforation or abscess without bleeding: Secondary | ICD-10-CM | POA: Diagnosis not present

## 2022-01-12 MED ORDER — CIPROFLOXACIN HCL 500 MG PO TABS: 500.0000 mg | ORAL_TABLET | Freq: Two times a day (BID) | ORAL | 0 refills | Status: AC

## 2022-01-12 MED ORDER — METRONIDAZOLE 500 MG PO TABS: 500.0000 mg | ORAL_TABLET | Freq: Three times a day (TID) | ORAL | 0 refills | Status: DC

## 2022-01-12 NOTE — Patient Instructions (Signed)
If worse, Emergency room  Drink plenty of fluids   Can do miralax if need to get bowels going more

## 2022-01-12 NOTE — Progress Notes (Signed)
Subjective:     Patient ID: Joan Mclaughlin, female    DOB: Nov 08, 1940, 82 y.o.   MRN: [id number]  Chief Complaint  Patient presents with   Diverticulitis    Possible diverticulitis due to diarrhea and soreness in the butt area, off and on since Monday    HPI In April, similar episode-told diverticulitis-cipro/flagyl-worked well Since 1/1-diarrhea so took immodium.  Not feeling good in general.  Occ some blood.  Rectum irrit.  Was straining after immodium.  Not having normal bm's since immodium.  Some blood.  No abd pain.  No fever.  Just "off".  +nausea, no vomit.  Not really eating well.  Is passing gas.    Hosp in 2016 w/diverticulitis.    There are no preventive care reminders to display for this patient.  Past Medical History:  Diagnosis Date   Alcoholism ([place])    sober for 32 years-noted 09/16/2018   Allergy    Anxiety    Cancer ([place])    skin cancer   Cholecystitis    Complication of anesthesia    Difficulty sleeping    takes med to sleep   Diverticulitis    Family history of adverse reaction to anesthesia    SIster - N/V   Gallstones    Ganglion cyst 12/11/2016   GERD (gastroesophageal reflux disease)    "RELATED TO GALLBLADDER"   Heart murmur    "ONLY DETECTED WHEN LYING DOWN"  - 10/16/2018- Not concerned with   History of Lyme disease 09/19/2016   Hyperlipidemia    Hypertension    Lyme disease    Pt states she has had Lyme Disease twice.    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, MD;  Location: [facility];  Service: Gastroenterology;  Laterality: N/A;   COLONOSCOPY WITH PROPOFOL N/A 11/02/2019   Procedure: COLONOSCOPY WITH PROPOFOL;  Surgeon: Rush Landmark Telford Nab., MD;  Location: [facility];  Service: Gastroenterology;  Laterality: N/A;   COLONOSCOPY WITH PROPOFOL N/A 08/31/2020   Procedure: COLONOSCOPY WITH PROPOFOL;  Surgeon: Rush Landmark Telford Nab., MD;  Location: [facility];  Service: Gastroenterology;  Laterality: N/A;   ENDOSCOPIC MUCOSAL RESECTION N/A 10/20/2018   Procedure: ENDOSCOPIC MUCOSAL RESECTION;  Surgeon: Rush Landmark Telford Nab., MD;  Location: [facility];  Service: Gastroenterology;  Laterality: N/A;   ENDOSCOPIC MUCOSAL RESECTION  11/02/2019   Procedure: ENDOSCOPIC MUCOSAL RESECTION;  Surgeon: Rush Landmark Telford Nab., MD;  Location: [facility];  Service: Gastroenterology;;   HEMOSTASIS CLIP PLACEMENT  10/20/2018   Procedure: HEMOSTASIS CLIP PLACEMENT;  Surgeon: Irving Copas., MD;  Location: [facility];  Service: Gastroenterology;;   HEMOSTASIS CLIP PLACEMENT  11/02/2019   Procedure: HEMOSTASIS CLIP PLACEMENT;  Surgeon: Irving Copas., MD;  Location: [facility];  Service: Gastroenterology;;   ORIF ANKLE FRACTURE Left 02/19/2019   Procedure: OPEN REDUCTION INTERNAL FIXATION (ORIF) LEFT ANKLE FRACTURE;  Surgeon: Erle Crocker, MD;  Location: [facility];  Service: Orthopedics;  Laterality: Left;   POLYPECTOMY  10/20/2018   Procedure: POLYPECTOMY;  Surgeon: Mansouraty, Telford Nab., MD;  Location: [facility];  Service: Gastroenterology;;   POLYPECTOMY  11/02/2019    Procedure: POLYPECTOMY;  Surgeon: Irving Copas., MD;  Location: [facility];  Service: Gastroenterology;;   POLYPECTOMY  08/31/2020   Procedure: POLYPECTOMY;  Surgeon: Irving Copas., MD;  Location: [facility];  Service: Gastroenterology;;   [place] INJECTION  10/20/2018   Procedure: SUBMUCOSAL LIFTING INJECTION;  Surgeon: Irving Copas., MD;  Location: [facility];  Service: Gastroenterology;;   SYNDESMOSIS REPAIR Left 02/19/2019   Procedure: SYNDESMOSIS REPAIR;  Surgeon: Erle Crocker, MD;  Location: [facility];  Service: Orthopedics;  Laterality: Left;   THORACIC DISCECTOMY  07/2007   T12   TUBAL LIGATION     WISDOM TOOTH EXTRACTION      Outpatient Medications Prior to Visit  Medication Sig Dispense Refill   acetic acid-hydrocortisone (VOSOL-HC) OTIC solution Place 3 drops into both ears 2 (two) times daily. 10 mL 0   ALPRAZolam (XANAX) 1 MG tablet TAKE 1/2 TABLET BY MOUTH EVERY DAY AT BEDTIME 15 tablet 5   amLODipine (NORVASC) 5 MG tablet TAKE 1 TABLET BY MOUTH EVERY DAY 90 tablet 3   Ascorbic Acid (VITAMIN C) 1000 MG tablet Take 1,000 mg by mouth daily.     b complex vitamins capsule Take 1 capsule by mouth daily.     CALCIUM-MAGNESIUM PO Take 1 tablet by mouth daily. 1000 mg / 500 mg     Cholecalciferol (VITAMIN D3) 50 MCG (2000 UT) capsule Take 2,000 Units by mouth daily.     hydrocortisone 2.5 % cream Apply topically 2 (two) times daily as needed (Rash). (Patient taking differently: Apply 1 application  topically 2 (two) times daily as needed (Rosacea).) 30 g 11   hydrOXYzine (ATARAX) 10 MG tablet Take 1-2 tablets (10-20 mg total) by mouth at bedtime. 180 tablet 2   ketoconazole (NIZORAL) 2 % shampoo USE AS DIRECTED 120 mL 3   Lysine 500 MG TABS Take 500 mg by mouth daily.     MELATONIN PO Take 1 capsule by mouth at bedtime.     Multiple Vitamin (MULTIVITAMIN WITH MINERALS) TABS tablet Take 1 tablet by mouth daily.     Multiple  Vitamins-Minerals (ZINC PO) Take 90 mg by mouth daily.     Omega-3 Fatty Acids (FISH OIL PO) Take 630 mg by mouth daily.     Oxcarbazepine (TRILEPTAL) 300 MG tablet Take 1 tablet ('300mg'$ ) by mouth twice daily 180 tablet 3   Potassium 99 MG TABS Take 99 mg by mouth daily.     Probiotic Product (PROBIOTIC DAILY) CAPS Take 1 capsule by mouth daily. Woman     Red Yeast Rice 600 MG CAPS Take 1,200 mg by mouth at bedtime. With Co-Q-10 100 mg     selenium 200 MCG TABS tablet Take 200 mcg by mouth daily.     triamcinolone cream (KENALOG) 0.1 % Apply 1 application topically daily. At night for Rosacea     TURMERIC PO Take 600 mg by mouth daily.     valACYclovir (VALTREX) 500 MG  tablet TAKE 1 TABLET BY MOUTH DAILY AS NEEDED (COLD SORES). 90 tablet 1   doxycycline (VIBRA-TABS) 100 MG tablet Take 1 tablet (100 mg total) by mouth 2 (two) times daily. 14 tablet 0   No facility-administered medications prior to visit.    Allergies  Allergen Reactions   Amoxicillin-Pot Clavulanate Nausea And Vomiting    Patient denies knowledge of this allergy  Did it involve swelling of the face/tongue/throat, SOB, or low BP? No Did it involve sudden or severe rash/hives, skin peeling, or any reaction on the inside of your mouth or nose? No Did you need to seek medical attention at a hospital or doctor's office? No When did it last happen?      in 2016 If all above answers are "NO", may proceed with cephalosporin use.    Doxycycline Hives   Suvorexant Other (See Comments)    Nightmares    ROS neg/noncontributory except as noted HPI/below Ran R 5th toe into couch "over the Holidays".  Hard to walk.       Objective:     BP 120/64   Pulse 71   Temp 97.9 F (36.6 C) (Temporal)   Ht '5\' 2"'$  (1.575 m)   Wt 138 lb (62.6 kg)   SpO2 97%   BMI 25.24 kg/m  Wt Readings from Last 3 Encounters:  01/12/22 138 lb (62.6 kg)  12/08/21 138 lb 9.6 oz (62.9 kg)  09/19/21 138 lb 6.4 oz (62.8 kg)    Physical Exam    Gen: WDWN NAD HEENT: NCAT, conjunctiva not injected, sclera nonicteric CARDIAC: RRR, S1S2+, no murmur LUNGS: CTAB. No wheezes ABDOMEN:  BS+, soft, NTND, No HSM, no masses EXT:  no edema MSK: no gross abnormalities.  NEURO: A&O x3.  CN II-XII intact.  PSYCH: normal mood. Good eye contact R foot-bruising on dorsum of last 3 toes-mtp area.  Tender 5th toe.  No ttp metatarsals.       Assessment & Plan:   Problem List Items Addressed This Visit   None Visit Diagnoses     Diverticulitis    -  Primary      Diverticulitis-prob.  Cipro 500 bid and flagyl '500mg'$  tid.  Increase water.  Worse, pain, vomiting-to ER.   Pain R 5th toe-poss fx.  Cont taping.    Meds ordered this encounter  Medications   ciprofloxacin (CIPRO) 500 MG tablet    Sig: Take 1 tablet (500 mg total) by mouth 2 (two) times daily for 10 days.    Dispense:  20 tablet    Refill:  0   metroNIDAZOLE (FLAGYL) 500 MG tablet    Sig: Take 1 tablet (500 mg total) by mouth 3 (three) times daily.    Dispense:  30 tablet    Refill:  0    Wellington Hampshire, MD

## 2022-01-19 ENCOUNTER — Telehealth: Payer: Self-pay | Admitting: Family Medicine

## 2022-01-19 NOTE — Telephone Encounter (Signed)
See note

## 2022-01-19 NOTE — Telephone Encounter (Signed)
Patient requests to be called regarding: Patient states she would like to know how she should be feeling after taking the antibiotics. States still not feeling well-not sure if it is because of the antibiotics or the Diverticulitis.  Patient is scheduled for OV on 01/24/22 for follow up on the above.

## 2022-01-23 NOTE — Telephone Encounter (Signed)
Looks like she has an appointment with Dr Cherlynn Kaiser tomorrow for this.  [name]. [name], MD 01/23/2022 9:59 AM

## 2022-01-24 ENCOUNTER — Encounter: Payer: Self-pay | Admitting: Family Medicine

## 2022-01-24 ENCOUNTER — Ambulatory Visit (INDEPENDENT_AMBULATORY_CARE_PROVIDER_SITE_OTHER): Payer: Medicare Other | Admitting: Family Medicine

## 2022-01-24 VITALS — BP 130/80 | HR 73 | Temp 97.5°F | Ht 62.0 in | Wt 136.4 lb

## 2022-01-24 DIAGNOSIS — R110 Nausea: Secondary | ICD-10-CM

## 2022-01-24 LAB — COMPREHENSIVE METABOLIC PANEL
ALT: 21 U/L (ref 0–35)
AST: 23 U/L (ref 0–37)
Albumin: 4.3 g/dL (ref 3.5–5.2)
Alkaline Phosphatase: 58 U/L (ref 39–117)
BUN: 16 mg/dL (ref 6–23)
CO2: 32 mEq/L (ref 19–32)
Calcium: 9.4 mg/dL (ref 8.4–10.5)
Chloride: 98 mEq/L (ref 96–112)
Creatinine, Ser: 0.52 mg/dL (ref 0.40–1.20)
GFR: 85.87 mL/min (ref >60.00)
Glucose, Bld: 79 mg/dL (ref 70–99)
Potassium: 4.3 mEq/L (ref 3.5–5.1)
Sodium: 135 mEq/L (ref 135–145)
Total Bilirubin: 0.4 mg/dL (ref 0.2–1.2)
Total Protein: 7.4 g/dL (ref 6.0–8.3)

## 2022-01-24 LAB — CBC WITH DIFFERENTIAL/PLATELET
Basophils Absolute: 0 10*3/uL (ref 0.0–0.1)
Basophils Relative: 0.7 % (ref 0.0–3.0)
Eosinophils Absolute: 0 10*3/uL (ref 0.0–0.7)
Eosinophils Relative: 0.1 % (ref 0.0–5.0)
HCT: 40.9 % (ref 36.0–46.0)
Hemoglobin: 14 g/dL (ref 12.0–15.0)
Lymphocytes Relative: 39.4 % (ref 12.0–46.0)
Lymphs Abs: 2.4 10*3/uL (ref 0.7–4.0)
MCHC: 34.3 g/dL (ref 30.0–36.0)
MCV: 93.4 fl (ref 78.0–100.0)
Monocytes Absolute: 0.7 10*3/uL (ref 0.1–1.0)
Monocytes Relative: 12.4 % — ABNORMAL HIGH (ref 3.0–12.0)
Neutro Abs: 2.9 10*3/uL (ref 1.4–7.7)
Neutrophils Relative %: 47.4 % (ref 43.0–77.0)
Platelets: 260 10*3/uL (ref 150.0–400.0)
RBC: 4.38 Mil/uL (ref 3.87–5.11)
RDW: 13.3 % (ref 11.5–15.5)
WBC: 6 10*3/uL (ref 4.0–10.5)

## 2022-01-24 NOTE — Patient Instructions (Signed)
It was very nice to see you today!  Drink more water.  Worse, not improving, let us know   PLEASE NOTE:  If you had any lab tests please let us know if you have not heard back within a few days. You may see your results on MyChart before we have a chance to review them but we will give you a call once they are reviewed by [place]. If we ordered any referrals today, please let us know if you have not heard from their office within the next week.   Please try these tips to maintain a healthy lifestyle:  Eat most of your calories during the day when you are active. Eliminate processed foods including packaged sweets (pies, cakes, cookies), reduce intake of potatoes, white bread, white pasta, and white rice. Look for whole grain options, oat flour or almond flour.  Each meal should contain half fruits/vegetables, one quarter protein, and one quarter carbs (no bigger than a computer mouse).  Cut down on sweet beverages. This includes juice, soda, and sweet tea. Also watch fruit intake, though this is a healthier sweet option, it still contains natural sugar! Limit to 3 servings daily.  Drink at least 1 glass of water with each meal and aim for at least 8 glasses per day  Exercise at least 150 minutes every week.

## 2022-01-24 NOTE — Progress Notes (Signed)
Subjective:     Patient ID: Joan Mclaughlin, female    DOB: 1941/01/07, 82 y.o.   MRN: [id number]  Chief Complaint  Patient presents with   Follow-up    Diverticulitis follow-up Still having some nausea    HPI Diverticulitis f/u-pt seen by me on 01/12/22 for diarrhea and abd pain.  H/o diverticulitis.  Took cipro and flagyl.  Still some nausea from meds but getting better-mild. Finished meds 1/13.  Stools are not "normal" yet.  Not drinking enough.  Took immodium on 1/12.   No dizziness. Balance off some.  There are no preventive care reminders to display for this patient.  Past Medical History:  Diagnosis Date   Alcoholism ([place])    sober for 10 years-noted 09/16/2018   Allergy    Anxiety    Cancer ([place])    skin cancer   Cholecystitis    Complication of anesthesia    Difficulty sleeping    takes med to sleep   Diverticulitis    Family history of adverse reaction to anesthesia    SIster - N/V   Gallstones    Ganglion cyst 12/11/2016   GERD (gastroesophageal reflux disease)    "RELATED TO GALLBLADDER"   Heart murmur    "ONLY DETECTED WHEN LYING DOWN"  - 10/16/2018- Not concerned with   History of Lyme disease 09/19/2016   Hyperlipidemia    Hypertension    Lyme disease    Pt states she has had Lyme Disease twice.    Nocturia    Pneumonia    PONV (postoperative nausea and vomiting)    Nausea   S/P laparoscopic cholecystectomy 12/23/2014   Seizure disorder ([place])    Seizures ([place])    Seizures ([place])    "last one 10- 15 years ago - 10/16/2018   Squamous cell carcinoma of skin 12/25/2018   RIGHT FOREARM    Squamous cell carcinoma of skin 09/30/2019   in situ-left lower leg-anterior   Trimalleolar fracture    left   Tubular adenoma of colon 06/27/2018    Past Surgical History:  Procedure Laterality Date   CHOLECYSTECTOMY N/A 12/23/2014   Procedure: LAPAROSCOPIC CHOLECYSTECTOMY WITH INTRAOPERATIVE CHOLANGIOGRAM;  Surgeon: Greer Pickerel, MD;  Location: [facility];   Service: General;  Laterality: N/A;   COLONOSCOPY     "3 times trying to get large polyp since August 2020"   COLONOSCOPY WITH PROPOFOL N/A 10/20/2018   Procedure: COLONOSCOPY WITH PROPOFOL;  Surgeon: Irving Copas., MD;  Location: [facility];  Service: Gastroenterology;  Laterality: N/A;   COLONOSCOPY WITH PROPOFOL N/A 11/02/2019   Procedure: COLONOSCOPY WITH PROPOFOL;  Surgeon: Rush Landmark Telford Nab., MD;  Location: [facility];  Service: Gastroenterology;  Laterality: N/A;   COLONOSCOPY WITH PROPOFOL N/A 08/31/2020   Procedure: COLONOSCOPY WITH PROPOFOL;  Surgeon: Rush Landmark Telford Nab., MD;  Location: [facility];  Service: Gastroenterology;  Laterality: N/A;   ENDOSCOPIC MUCOSAL RESECTION N/A 10/20/2018   Procedure: ENDOSCOPIC MUCOSAL RESECTION;  Surgeon: Rush Landmark Telford Nab., MD;  Location: [place];  Service: Gastroenterology;  Laterality: N/A;   ENDOSCOPIC MUCOSAL RESECTION  11/02/2019   Procedure: ENDOSCOPIC MUCOSAL RESECTION;  Surgeon: Rush Landmark Telford Nab., MD;  Location: [facility];  Service: Gastroenterology;;   HEMOSTASIS CLIP PLACEMENT  10/20/2018   Procedure: HEMOSTASIS CLIP PLACEMENT;  Surgeon: Irving Copas., MD;  Location: [facility];  Service: Gastroenterology;;   HEMOSTASIS CLIP PLACEMENT  11/02/2019   Procedure: HEMOSTASIS CLIP PLACEMENT;  Surgeon: Irving Copas., MD;  Location: [facility];  Service:  Gastroenterology;;   ORIF ANKLE FRACTURE Left 02/19/2019   Procedure: OPEN REDUCTION INTERNAL FIXATION (ORIF) LEFT ANKLE FRACTURE;  Surgeon: Erle Crocker, MD;  Location: [facility];  Service: Orthopedics;  Laterality: Left;   POLYPECTOMY  10/20/2018   Procedure: POLYPECTOMY;  Surgeon: Mansouraty, Telford Nab., MD;  Location: [facility];  Service: Gastroenterology;;   POLYPECTOMY  11/02/2019   Procedure: POLYPECTOMY;  Surgeon: Irving Copas., MD;  Location: [facility];  Service: Gastroenterology;;   POLYPECTOMY   08/31/2020   Procedure: POLYPECTOMY;  Surgeon: Irving Copas., MD;  Location: [facility];  Service: Gastroenterology;;   [place] INJECTION  10/20/2018   Procedure: SUBMUCOSAL LIFTING INJECTION;  Surgeon: Irving Copas., MD;  Location: [facility];  Service: Gastroenterology;;   SYNDESMOSIS REPAIR Left 02/19/2019   Procedure: SYNDESMOSIS REPAIR;  Surgeon: Erle Crocker, MD;  Location: [facility];  Service: Orthopedics;  Laterality: Left;   THORACIC DISCECTOMY  07/2007   T12   TUBAL LIGATION     WISDOM TOOTH EXTRACTION      Outpatient Medications Prior to Visit  Medication Sig Dispense Refill   acetic acid-hydrocortisone (VOSOL-HC) OTIC solution Place 3 drops into both ears 2 (two) times daily. 10 mL 0   ALPRAZolam (XANAX) 1 MG tablet TAKE 1/2 TABLET BY MOUTH EVERY DAY AT BEDTIME 15 tablet 5   amLODipine (NORVASC) 5 MG tablet TAKE 1 TABLET BY MOUTH EVERY DAY 90 tablet 3   Ascorbic Acid (VITAMIN C) 1000 MG tablet Take 1,000 mg by mouth daily.     b complex vitamins capsule Take 1 capsule by mouth daily.     CALCIUM-MAGNESIUM PO Take 1 tablet by mouth daily. 1000 mg / 500 mg     Cholecalciferol (VITAMIN D3) 50 MCG (2000 UT) capsule Take 2,000 Units by mouth daily.     hydrocortisone 2.5 % cream Apply topically 2 (two) times daily as needed (Rash). (Patient taking differently: Apply 1 application  topically 2 (two) times daily as needed (Rosacea).) 30 g 11   hydrOXYzine (ATARAX) 10 MG tablet Take 1-2 tablets (10-20 mg total) by mouth at bedtime. 180 tablet 2   ketoconazole (NIZORAL) 2 % shampoo USE AS DIRECTED 120 mL 3   Lysine 500 MG TABS Take 500 mg by mouth daily.     MELATONIN PO Take 1 capsule by mouth at bedtime.     Multiple Vitamin (MULTIVITAMIN WITH MINERALS) TABS tablet Take 1 tablet by mouth daily.     Multiple Vitamins-Minerals (ZINC PO) Take 90 mg by mouth daily.     Omega-3 Fatty Acids (FISH OIL PO) Take 630 mg by mouth daily.     Oxcarbazepine  (TRILEPTAL) 300 MG tablet Take 1 tablet ('300mg'$ ) by mouth twice daily 180 tablet 3   Potassium 99 MG TABS Take 99 mg by mouth daily.     Probiotic Product (PROBIOTIC DAILY) CAPS Take 1 capsule by mouth daily. Woman     Red Yeast Rice 600 MG CAPS Take 1,200 mg by mouth at bedtime. With Co-Q-10 100 mg     selenium 200 MCG TABS tablet Take 200 mcg by mouth daily.     triamcinolone cream (KENALOG) 0.1 % Apply 1 application topically daily. At night for Rosacea     TURMERIC PO Take 600 mg by mouth daily.     valACYclovir (VALTREX) 500 MG tablet TAKE 1 TABLET BY MOUTH DAILY AS NEEDED (COLD SORES). 90 tablet 1   ciprofloxacin (CIPRO) 500 MG tablet Take 500 mg by mouth 2 (  two) times daily.     metroNIDAZOLE (FLAGYL) 500 MG tablet Take 1 tablet (500 mg total) by mouth 3 (three) times daily. 30 tablet 0   No facility-administered medications prior to visit.    Allergies  Allergen Reactions   Amoxicillin-Pot Clavulanate Nausea And Vomiting    Patient denies knowledge of this allergy  Did it involve swelling of the face/tongue/throat, SOB, or low BP? No Did it involve sudden or severe rash/hives, skin peeling, or any reaction on the inside of your mouth or nose? No Did you need to seek medical attention at a hospital or doctor's office? No When did it last happen?      in 2016 If all above answers are "NO", may proceed with cephalosporin use.    Doxycycline Hives   Suvorexant Other (See Comments)    Nightmares    ROS neg/noncontributory except as noted HPI/below      Objective:     BP 130/80   Pulse 73   Temp (!) 97.5 F (36.4 C) (Temporal)   Ht '5\' 2"'$  (1.575 m)   Wt 136 lb 6 oz (61.9 kg)   SpO2 99%   BMI 24.94 kg/m  Wt Readings from Last 3 Encounters:  01/24/22 136 lb 6 oz (61.9 kg)  01/12/22 138 lb (62.6 kg)  12/08/21 138 lb 9.6 oz (62.9 kg)    Physical Exam   Gen: WDWN NAD.  Looks younger. HEENT: NCAT, conjunctiva not injected, sclera nonicteric NECK:  supple, no  thyromegaly, no nodes, no carotid bruits CARDIAC: RRR, S1S2+, no murmur. DP 2+B LUNGS: CTAB. No wheezes ABDOMEN:  BS+, soft, NTND, No HSM, no masses EXT:  no edema MSK: no gross abnormalities.  NEURO: A&O x3.  CN II-XII intact.  PSYCH: normal mood. Good eye contact     Assessment & Plan:   Problem List Items Addressed This Visit   None Visit Diagnoses     Nausea    -  Primary   Relevant Orders   Comprehensive metabolic panel   CBC with Differential/Platelet      Nausea-not sure if from illness, meds, other.  Will check cbc,cmp.  Drink more water.  Worse, etc, let us know  No orders of the defined types were placed in this encounter.   Wellington Hampshire, MD

## 2022-02-16 ENCOUNTER — Other Ambulatory Visit: Payer: Self-pay | Admitting: Family Medicine

## 2022-02-16 ENCOUNTER — Other Ambulatory Visit: Payer: Self-pay | Admitting: Neurology

## 2022-02-16 DIAGNOSIS — G40009 Localization-related (focal) (partial) idiopathic epilepsy and epileptic syndromes with seizures of localized onset, not intractable, without status epilepticus: Secondary | ICD-10-CM

## 2022-02-18 DIAGNOSIS — Z0000 Encounter for general adult medical examination without abnormal findings: Secondary | ICD-10-CM | POA: Diagnosis not present

## 2022-02-18 DIAGNOSIS — U071 COVID-19: Secondary | ICD-10-CM | POA: Diagnosis not present

## 2022-02-18 DIAGNOSIS — J029 Acute pharyngitis, unspecified: Secondary | ICD-10-CM | POA: Diagnosis not present

## 2022-02-18 DIAGNOSIS — R059 Cough, unspecified: Secondary | ICD-10-CM | POA: Diagnosis not present

## 2022-02-19 ENCOUNTER — Telehealth: Payer: Self-pay | Admitting: Family Medicine

## 2022-02-19 NOTE — Telephone Encounter (Signed)
Patient states: - She tested positive for covid while in UC over weekend  - She was only given z pack and prednisone   Patient requests: -PCP recommendation on if treatment choice given in UC was the best or not  - She is interested in options regarding paxlovid   Patient has been scheduled for 02/20/22 @ 11:20am.

## 2022-02-20 ENCOUNTER — Ambulatory Visit (INDEPENDENT_AMBULATORY_CARE_PROVIDER_SITE_OTHER): Payer: Medicare Other | Admitting: Family Medicine

## 2022-02-20 ENCOUNTER — Encounter: Payer: Self-pay | Admitting: Family Medicine

## 2022-02-20 VITALS — BP 143/77 | HR 70 | Ht 62.0 in | Wt 136.0 lb

## 2022-02-20 DIAGNOSIS — I10 Essential (primary) hypertension: Secondary | ICD-10-CM | POA: Diagnosis not present

## 2022-02-20 DIAGNOSIS — G40009 Localization-related (focal) (partial) idiopathic epilepsy and epileptic syndromes with seizures of localized onset, not intractable, without status epilepticus: Secondary | ICD-10-CM | POA: Diagnosis not present

## 2022-02-20 MED ORDER — BENZONATATE 200 MG PO CAPS: 200.0000 mg | ORAL_CAPSULE | Freq: Two times a day (BID) | ORAL | 0 refills | Status: DC | PRN

## 2022-02-20 NOTE — Telephone Encounter (Signed)
Patient has OV today with PCP

## 2022-02-20 NOTE — Progress Notes (Signed)
   Joan Mclaughlin is a 82 y.o. female who presents today for an office visit.  Assessment/Plan:  New/Acute Problems: COVID No red flags. She is improving and out of the window for antivirals at this point.  She is still having quite a bit of cough.  Will send in Tessalon.  Encouraged hydration.  She can also use over-the-counter meds as needed.  We discussed reasons to return to care.  Follow-up as needed.  Sinusitis  She will complete her course of azithromycin and prednisone that was given by UC. She will let us know if not improving.   Chronic Problems Addressed Today: Essential hypertension Mildly elevated today in setting acute illness that typically well-controlled.  Continue amlodipine 5 mg daily.  She will continue monitor at home and let us know if persistently elevated.  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  Continue management per neurology.  She will need to avoid paxlovid due to being on oxcarbazepine.      Subjective:  HPI:  See A/P for status of chronic conditions.  Patient is here today with COVID.  She went to urgent care a couple of days ago.  Tested positive for COVID. Her symptoms started about 8 days ago. Symptoms icnlude cough, congestion, malaise, and fatigue. Flu test was negative. She was given a prescription for azithromycin and prednisone.        Objective:  Physical Exam: BP (!) 143/77   Pulse 70   Ht '5\' 2"'$  (1.575 m)   Wt 136 lb (61.7 kg)   SpO2 97%   BMI 24.87 kg/m   Gen: No acute distress, resting comfortably CV: Regular rate and rhythm with no murmurs appreciated Pulm: Normal work of breathing, clear to auscultation bilaterally with no crackles, wheezes, or rhonchi Neuro: Grossly normal, moves all extremities Psych: Normal affect and thought content      Gustie Bobb M. [name], MD 02/20/2022 12:03 PM

## 2022-02-20 NOTE — Patient Instructions (Signed)
It was very nice to see you today!  I am glad you are feeling a little bit better.   Please continue with your medications.  I will send in a cough medication for you as well.  Let me know if not improving.  Take care, Dr [name]  PLEASE NOTE:  If you had any lab tests, please let us know if you have not heard back within a few days. You may see your results on mychart before we have a chance to review them but we will give you a call once they are reviewed by [place].   If we ordered any referrals today, please let us know if you have not heard from their office within the next week.   If you had any urgent prescriptions sent in today, please check with the pharmacy within an hour of our visit to make sure the prescription was transmitted appropriately.   Please try these tips to maintain a healthy lifestyle:  Eat at least 3 REAL meals and 1-2 snacks per day.  Aim for no more than 5 hours between eating.  If you eat breakfast, please do so within one hour of getting up.   Each meal should contain half fruits/vegetables, one quarter protein, and one quarter carbs (no bigger than a computer mouse)  Cut down on sweet beverages. This includes juice, soda, and sweet tea.   Drink at least 1 glass of water with each meal and aim for at least 8 glasses per day  Exercise at least 150 minutes every week.

## 2022-02-20 NOTE — Assessment & Plan Note (Signed)
Stable.  Continue management per neurology.  She will need to avoid paxlovid due to being on oxcarbazepine.

## 2022-02-20 NOTE — Assessment & Plan Note (Signed)
Mildly elevated today in setting acute illness that typically well-controlled.  Continue amlodipine 5 mg daily.  She will continue monitor at home and let us know if persistently elevated.

## 2022-02-26 DIAGNOSIS — I10 Essential (primary) hypertension: Secondary | ICD-10-CM | POA: Diagnosis not present

## 2022-02-26 DIAGNOSIS — R059 Cough, unspecified: Secondary | ICD-10-CM | POA: Diagnosis not present

## 2022-03-06 ENCOUNTER — Encounter: Payer: Self-pay | Admitting: Family Medicine

## 2022-03-06 ENCOUNTER — Ambulatory Visit (INDEPENDENT_AMBULATORY_CARE_PROVIDER_SITE_OTHER): Payer: Medicare Other | Admitting: Family Medicine

## 2022-03-06 VITALS — BP 116/74 | HR 69 | Temp 97.8°F | Ht 62.0 in | Wt 136.2 lb

## 2022-03-06 DIAGNOSIS — I10 Essential (primary) hypertension: Secondary | ICD-10-CM

## 2022-03-06 DIAGNOSIS — U071 COVID-19: Secondary | ICD-10-CM | POA: Diagnosis not present

## 2022-03-06 DIAGNOSIS — J309 Allergic rhinitis, unspecified: Secondary | ICD-10-CM

## 2022-03-06 LAB — POC COVID19 BINAXNOW: SARS Coronavirus 2 Ag: NEGATIVE

## 2022-03-06 NOTE — Patient Instructions (Signed)
It was very nice to see you today!  You following the expected recovery course for COVID.  Please let us know if your symptoms do not continue to improve.  Take care, Dr [name]  PLEASE NOTE:  If you had any lab tests, please let us know if you have not heard back within a few days. You may see your results on mychart before we have a chance to review them but we will give you a call once they are reviewed by [place].   If we ordered any referrals today, please let us know if you have not heard from their office within the next week.   If you had any urgent prescriptions sent in today, please check with the pharmacy within an hour of our visit to make sure the prescription was transmitted appropriately.   Please try these tips to maintain a healthy lifestyle:  Eat at least 3 REAL meals and 1-2 snacks per day.  Aim for no more than 5 hours between eating.  If you eat breakfast, please do so within one hour of getting up.   Each meal should contain half fruits/vegetables, one quarter protein, and one quarter carbs (no bigger than a computer mouse)  Cut down on sweet beverages. This includes juice, soda, and sweet tea.   Drink at least 1 glass of water with each meal and aim for at least 8 glasses per day  Exercise at least 150 minutes every week.

## 2022-03-06 NOTE — Assessment & Plan Note (Signed)
Possibly contributing some to her postnasal drip and cough.  She continues over-the-counter allergy meds.  She will let us know if not improving.

## 2022-03-06 NOTE — Progress Notes (Signed)
   Joan Mclaughlin is a 82 y.o. female who presents today for an office visit.  Assessment/Plan:  New/Acute Problems: COVID She has mostly recovered.  Still has a little bit of postnasal drip and cough.  Mild fatigue but this seems to be improving as well.  Discussed natural course of recovery.  Anticipate she will continue to improve over the next several weeks.  We discussed reasons to return to care.  Follow-up as needed.  Chronic Problems Addressed Today: Essential hypertension At goal today.  Continue amlodipine 5 mg daily.  Allergic rhinitis Possibly contributing some to her postnasal drip and cough.  She continues over-the-counter allergy meds.  She will let us know if not improving.     Subjective:  HPI:  See A/p for status of chronic conditions. She is here today for follow up. She had covid a few weeks ago. Her symptoms are improving. She is till having some fatigue but this seem to be improving. She is still having some cough and post nasal drip. No fevers or chills.  No shortness of breath.  No chest pain.       Objective:  Physical Exam: BP 116/74   Pulse 69   Temp 97.8 F (36.6 C) (Temporal)   Ht 5' 2"$  (1.575 m)   Wt 136 lb 3.2 oz (61.8 kg)   SpO2 99%   BMI 24.91 kg/m   Gen: No acute distress, resting comfortably HEENT: TMs clear.  OP clear. CV: Regular rate and rhythm with no murmurs appreciated Pulm: Normal work of breathing, clear to auscultation bilaterally with no crackles, wheezes, or rhonchi Neuro: Grossly normal, moves all extremities Psych: Normal affect and thought content      Joan Mclaughlin M. [name], MD 03/06/2022 10:18 AM

## 2022-03-06 NOTE — Assessment & Plan Note (Signed)
At goal today.  Continue amlodipine 5 mg daily.

## 2022-03-08 ENCOUNTER — Telehealth: Payer: Self-pay | Admitting: Family Medicine

## 2022-03-08 NOTE — Telephone Encounter (Signed)
Pt states she is having urgency to go to the bathroom, she was seen on 03/06/22. She would like a call back. Please advise.

## 2022-03-09 ENCOUNTER — Encounter: Payer: Self-pay | Admitting: Family Medicine

## 2022-03-09 ENCOUNTER — Ambulatory Visit (INDEPENDENT_AMBULATORY_CARE_PROVIDER_SITE_OTHER): Payer: Medicare Other | Admitting: Family Medicine

## 2022-03-09 VITALS — BP 128/78 | HR 74 | Temp 97.7°F | Ht 62.0 in | Wt 138.0 lb

## 2022-03-09 DIAGNOSIS — I10 Essential (primary) hypertension: Secondary | ICD-10-CM

## 2022-03-09 DIAGNOSIS — R3915 Urgency of urination: Secondary | ICD-10-CM | POA: Diagnosis not present

## 2022-03-09 LAB — POC URINALSYSI DIPSTICK (AUTOMATED)
Bilirubin, UA: NEGATIVE
Blood, UA: NEGATIVE
Glucose, UA: NEGATIVE
Ketones, UA: NEGATIVE
Leukocytes, UA: NEGATIVE
Nitrite, UA: NEGATIVE
Protein, UA: POSITIVE — AB
Spec Grav, UA: 1.025 (ref 1.010–1.025)
Urobilinogen, UA: NEGATIVE E.U./dL — AB
pH, UA: 5 (ref 5.0–8.0)

## 2022-03-09 NOTE — Telephone Encounter (Signed)
Pt was seen by Dr. Yong Channel @ 2pm on 03/09/22.

## 2022-03-09 NOTE — Progress Notes (Signed)
Phone [phone] In person visit   Subjective:   Joan Mclaughlin is a 82 y.o. 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   Essential hypertension 10/14/2018   Abnormal colonoscopy 09/18/2018   History of colonic polyps 09/16/2018   Tubular adenoma of colon 06/27/2018   Dyslipidemia 03/06/2018   Allergic rhinitis 03/05/2018   History of cold sores 03/05/2018   Dermatochalasis of both upper eyelids 01/12/2018   Constipation 01/14/2017   Localization-related idiopathic epilepsy and epileptic syndromes with seizures of localized onset, not intractable, without status epilepticus ([place]) 03/02/2015   Insomnia 05/02/2013    Medications- reviewed and updated Current Outpatient Medications  Medication Sig Dispense Refill   acetic acid-hydrocortisone (VOSOL-HC) OTIC solution Place 3 drops into both ears 2 (two) times daily. 10 mL 0   ALPRAZolam (XANAX) 1 MG tablet TAKE 1/2 TABLET BY MOUTH EVERY DAY AT BEDTIME 15 tablet 5   amLODipine (NORVASC) 5 MG tablet TAKE 1 TABLET BY MOUTH EVERY DAY 90 tablet 1   Ascorbic Acid (VITAMIN C) 1000 MG tablet Take 1,000 mg by mouth daily.     b complex vitamins capsule Take 1 capsule by mouth daily.     benzonatate (TESSALON) 200 MG capsule Take 1 capsule (200 mg total) by mouth 2 (two) times daily as needed for cough. 20 capsule 0   CALCIUM-MAGNESIUM PO Take 1 tablet by mouth daily. 1000 mg / 500 mg     Cholecalciferol (VITAMIN D3) 50 MCG (2000 UT) capsule Take 2,000 Units by mouth daily.     hydrocortisone 2.5 % cream Apply topically 2 (two) times daily as  needed (Rash). (Patient taking differently: Apply 1 application  topically 2 (two) times daily as needed (Rosacea).) 30 g 11   hydrOXYzine (ATARAX) 10 MG tablet Take 1-2 tablets (10-20 mg total) by mouth at bedtime. 180 tablet 2   ketoconazole (NIZORAL) 2 % shampoo USE AS DIRECTED 120 mL 3   Lysine 500 MG TABS Take 500 mg by mouth daily.     MELATONIN PO Take 1 capsule by mouth at bedtime.     Multiple Vitamin (MULTIVITAMIN WITH MINERALS) TABS tablet Take 1 tablet by mouth daily.     Multiple Vitamins-Minerals (ZINC PO) Take 90 mg by mouth daily.     Omega-3 Fatty Acids (FISH OIL PO) Take 630 mg by mouth daily.     Oxcarbazepine (TRILEPTAL) 300 MG tablet TAKE 1 TABLET BY MOUTH TWICE DAILY 180 tablet 0   Potassium 99 MG TABS Take 99 mg by mouth daily.     Probiotic Product (PROBIOTIC DAILY) CAPS Take 1 capsule by mouth daily. Woman     Red Yeast Rice 600 MG CAPS Take 1,200 mg by mouth at bedtime. With Co-Q-10 100 mg     selenium 200 MCG TABS tablet Take 200 mcg by mouth daily.     triamcinolone cream (KENALOG) 0.1 % Apply 1 application topically daily. At night for Rosacea     TURMERIC PO Take 600 mg by mouth daily.     valACYclovir (VALTREX) 500 MG tablet TAKE 1 TABLET BY MOUTH DAILY AS NEEDED (  COLD SORES). 90 tablet 1   No current facility-administered medications for this visit.     Objective:  BP 128/78   Pulse 74   Temp 97.7 F (36.5 C)   Ht '5\' 2"'$  (1.575 m)   Wt 138 lb (62.6 kg)   SpO2 98%   BMI 25.24 kg/m  Gen: NAD, resting comfortably  Results for orders placed or performed in visit on 03/09/22 (from the past 24 hour(s))  POCT Urinalysis Dipstick (Automated)     Status: Abnormal   Collection Time: 03/09/22  1:58 PM  Result Value Ref Range   Color, UA yellow    Clarity, UA clear    Glucose, UA Negative Negative   Bilirubin, UA neg    Ketones, UA neg    Spec Grav, UA 1.025 1.010 - 1.025   Blood, UA neg    pH, UA 5.0 5.0 - 8.0   Protein, UA Positive (A) Negative    Urobilinogen, UA negative (A) 0.2 or 1.0 E.U./dL   Nitrite, UA negative    Leukocytes, UA Negative Negative   Lab Results  Component Value Date   ALT 21 01/24/2022   AST 23 01/24/2022   ALKPHOS 58 01/24/2022   BILITOT 0.4 01/24/2022      Assessment and Plan    #Concern for UTI S: Patients symptoms started  about 2 days ago (recently recovered from covid).  Complains of dysuria: no; polyuria: yes but only at night; nocturia: yes; urgency: yes- mainly 3x before she wakes up each morning.  Symptoms are stable in last 2 days.  ROS- no fever, chills, nausea, vomiting, flank pain. No blood in urine.  A/P: UA not overly impressive for UTI but does appear on the dryer side- still possible UTI. Will get urine culture. Empiric treatment we opted to hold off Patient to follow up if new or worsening symptoms or failure to improve.   #hypertension S: medication: amodipine 5 mg. Initial BP elevated at 140 so opted to recheck BP Readings from Last 3 Encounters:  03/09/22 128/78  03/06/22 116/74  02/20/22 (!) 143/77  A/P: stable/controlled- continue current medicines   Recommended follow up: Return for as needed for new, worsening, persistent symptoms. Future Appointments  Date Time Provider [place]  07/19/2022 10:15 AM [facility] HEALTH COACH [facility]  07/27/2022 10:00 AM Cameron Sprang, MD [facility] None    Lab/Order associations:   ICD-10-CM   1. Urinary urgency  R39.15 Urine Culture    POCT Urinalysis Dipstick (Automated)    Urine Culture    2. Essential hypertension  I10      Return precautions advised.  Garret Reddish, MD

## 2022-03-09 NOTE — Telephone Encounter (Signed)
Patient need OV for evaluation and Urine test

## 2022-03-09 NOTE — Patient Instructions (Addendum)
UA not overly impressive for UTI but does appear on the dryer side- still possible UTI. Will get urine culture. Empiric treatment we opted to hold off Patient to follow up if new or worsening symptoms or failure to improve.  I do want her to increase fluid intake before 5-6 pm as may have gotten slightly dehydrated with covid  Recommended follow up: Return for as needed for new, worsening, persistent symptoms.

## 2022-03-11 LAB — URINE CULTURE
MICRO NUMBER:: 14637952
SPECIMEN QUALITY:: ADEQUATE

## 2022-03-12 ENCOUNTER — Other Ambulatory Visit: Payer: Self-pay | Admitting: Family Medicine

## 2022-03-12 MED ORDER — SULFAMETHOXAZOLE-TRIMETHOPRIM 800-160 MG PO TABS: 1.0000 | ORAL_TABLET | Freq: Two times a day (BID) | ORAL | 0 refills | Status: DC

## 2022-03-13 ENCOUNTER — Telehealth: Payer: Self-pay | Admitting: Family Medicine

## 2022-03-13 NOTE — Telephone Encounter (Signed)
Patient returned call for lab results. Requests to be called.

## 2022-03-14 NOTE — Telephone Encounter (Signed)
Patient returned Keba's call. Requests to be called.

## 2022-03-14 NOTE — Telephone Encounter (Signed)
Returned pt call and results reviewed.

## 2022-03-20 ENCOUNTER — Encounter: Payer: Self-pay | Admitting: Family Medicine

## 2022-03-20 ENCOUNTER — Ambulatory Visit (INDEPENDENT_AMBULATORY_CARE_PROVIDER_SITE_OTHER): Payer: Medicare Other | Admitting: Family Medicine

## 2022-03-20 VITALS — BP 124/74 | HR 76 | Temp 97.3°F | Ht 62.0 in | Wt 137.0 lb

## 2022-03-20 DIAGNOSIS — G4700 Insomnia, unspecified: Secondary | ICD-10-CM

## 2022-03-20 DIAGNOSIS — N390 Urinary tract infection, site not specified: Secondary | ICD-10-CM | POA: Diagnosis not present

## 2022-03-20 DIAGNOSIS — I10 Essential (primary) hypertension: Secondary | ICD-10-CM | POA: Diagnosis not present

## 2022-03-20 MED ORDER — TRAZODONE HCL 50 MG PO TABS: 50.0000 mg | ORAL_TABLET | Freq: Every day | ORAL | 0 refills | Status: DC

## 2022-03-20 NOTE — Progress Notes (Signed)
   Joan Mclaughlin is a 82 y.o. female who presents today for an office visit.  Assessment/Plan:  Chronic Problems Addressed Today: Insomnia I lengthy discussion with patient regarding her treatment options.  She is currently on hydroxyzine nightly.  Does not feel like it is effective.  We had tried trazodone several years ago she did not feel like it was effective but she is willing to try again.  Will send in prescription for 50 mg daily.  She can follow-up with me in a couple of weeks.  If no improvement would consider trial of gabapentin or doxepin.  She has had bad side effects with Ambien in the past-we will avoid this class of medications.  Essential hypertension At goal on amlodipine for milligrams daily.     Subjective:  HPI:  See A/P for status of chronic conditions.  She was seen here about a week ago by a different provider for UTI. Started on bactrim. Her symptoms are improving.   Her main concern today is insomnia. This has been a long standing issue. She usually falls asleep normally without much difficulty. She then wakes up 2-3 hours after falling asleep. Having a hard time going back to sleep. Thinks she is only getting a few hours of sleep per night. She has been taking hydroxyzine 10-20mg  nightly but does not think that this is helpful.  She occasionally takes alprazolam at night as well.  Has not found this to be effective.       Objective:  Physical Exam: BP 124/74   Pulse 76   Temp (!) 97.3 F (36.3 C) (Temporal)   Ht 5\' 2"  (1.575 m)   Wt 137 lb (62.1 kg)   SpO2 98%   BMI 25.06 kg/m   Gen: No acute distress, resting comfortably CV: Regular rate and rhythm with no murmurs appreciated Pulm: Normal work of breathing, clear to auscultation bilaterally with no crackles, wheezes, or rhonchi Neuro: Grossly normal, moves all extremities Psych: Normal affect and thought content      Shaylynn Nulty M. [name], MD 03/20/2022 10:17 AM

## 2022-03-20 NOTE — Assessment & Plan Note (Signed)
At goal on amlodipine for milligrams daily.

## 2022-03-20 NOTE — Patient Instructions (Signed)
It was very nice to see you today!  Please try the trazodone.   Send me a message in a few weeks to let me know how this is working for you.  Take care, Dr [name]  PLEASE NOTE:  If you had any lab tests, please let us know if you have not heard back within a few days. You may see your results on mychart before we have a chance to review them but we will give you a call once they are reviewed by [place].   If we ordered any referrals today, please let us know if you have not heard from their office within the next week.   If you had any urgent prescriptions sent in today, please check with the pharmacy within an hour of our visit to make sure the prescription was transmitted appropriately.   Please try these tips to maintain a healthy lifestyle:  Eat at least 3 REAL meals and 1-2 snacks per day.  Aim for no more than 5 hours between eating.  If you eat breakfast, please do so within one hour of getting up.   Each meal should contain half fruits/vegetables, one quarter protein, and one quarter carbs (no bigger than a computer mouse)  Cut down on sweet beverages. This includes juice, soda, and sweet tea.   Drink at least 1 glass of water with each meal and aim for at least 8 glasses per day  Exercise at least 150 minutes every week.

## 2022-03-20 NOTE — Assessment & Plan Note (Signed)
I lengthy discussion with patient regarding her treatment options.  She is currently on hydroxyzine nightly.  Does not feel like it is effective.  We had tried trazodone several years ago she did not feel like it was effective but she is willing to try again.  Will send in prescription for 50 mg daily.  She can follow-up with me in a couple of weeks.  If no improvement would consider trial of gabapentin or doxepin.  She has had bad side effects with Ambien in the past-we will avoid this class of medications.

## 2022-03-29 ENCOUNTER — Ambulatory Visit (INDEPENDENT_AMBULATORY_CARE_PROVIDER_SITE_OTHER): Payer: Medicare Other | Admitting: Family Medicine

## 2022-03-29 VITALS — BP 120/70 | HR 70 | Temp 98.0°F | Ht 62.0 in | Wt 136.4 lb

## 2022-03-29 DIAGNOSIS — G4700 Insomnia, unspecified: Secondary | ICD-10-CM | POA: Diagnosis not present

## 2022-03-29 DIAGNOSIS — L219 Seborrheic dermatitis, unspecified: Secondary | ICD-10-CM

## 2022-03-29 MED ORDER — TRIAMCINOLONE ACETONIDE 0.1 % EX CREA: 1.0000 | TOPICAL_CREAM | Freq: Every day | CUTANEOUS | 5 refills | Status: AC

## 2022-03-29 NOTE — Patient Instructions (Signed)
It was very nice to see you today!  I will refill your triamcinolone.  We can continue with your trazodone.  Send me a message in a couple of weeks to let me know how you are doing.  Take care, Dr [name]  PLEASE NOTE:  If you had any lab tests, please let us know if you have not heard back within a few days. You may see your results on mychart before we have a chance to review them but we will give you a call once they are reviewed by [place].   If we ordered any referrals today, please let us know if you have not heard from their office within the next week.   If you had any urgent prescriptions sent in today, please check with the pharmacy within an hour of our visit to make sure the prescription was transmitted appropriately.   Please try these tips to maintain a healthy lifestyle:  Eat at least 3 REAL meals and 1-2 snacks per day.  Aim for no more than 5 hours between eating.  If you eat breakfast, please do so within one hour of getting up.   Each meal should contain half fruits/vegetables, one quarter protein, and one quarter carbs (no bigger than a computer mouse)  Cut down on sweet beverages. This includes juice, soda, and sweet tea.   Drink at least 1 glass of water with each meal and aim for at least 8 glasses per day  Exercise at least 150 minutes every week.

## 2022-03-29 NOTE — Progress Notes (Signed)
   Joan Mclaughlin is a 82 y.o. female who presents today for an office visit.  Assessment/Plan:  Chronic Problems Addressed Today: Seborrheic dermatitis Presently follows with dermatology for this.  Was prescribed triamcinolone 1% daily as needed for your recent flare.  Will refill today.  She has been tolerating well.  Insomnia She has had modest success with trazodone.  Side effect so far have been tolerable.  We had a lengthy discussion regarding alternatives however we will continue with trazodone for now.  She is only on this for the last week or so.  She will follow-up with me in a couple of weeks.  If symptoms are not controlled or if side effects are intolerable would consider switching to alternative such as doxepin.     Subjective:  HPI:  See A/p for status of chronic conditions.  At our last visit we started her on trazodone for insomnia. She does feel like this has helped modestly with her sleep. She does feel a little more "off balance" when walking. No falls.  Sleeping longer though still does occasionally wake up.       Objective:  Physical Exam: BP 120/70   Pulse 70   Temp 98 F (36.7 C) (Temporal)   Ht 5\' 2"  (1.575 m)   Wt 136 lb 6.4 oz (61.9 kg)   SpO2 96%   BMI 24.95 kg/m   Gen: No acute distress, resting comfortably Neuro: Grossly normal, moves all extremities Psych: Normal affect and thought content      Nilesh Stegall M. [name], MD 03/29/2022 2:43 PM

## 2022-03-29 NOTE — Assessment & Plan Note (Signed)
She has had modest success with trazodone.  Side effect so far have been tolerable.  We had a lengthy discussion regarding alternatives however we will continue with trazodone for now.  She is only on this for the last week or so.  She will follow-up with me in a couple of weeks.  If symptoms are not controlled or if side effects are intolerable would consider switching to alternative such as doxepin.

## 2022-03-29 NOTE — Assessment & Plan Note (Signed)
Presently follows with dermatology for this.  Was prescribed triamcinolone 1% daily as needed for your recent flare.  Will refill today.  She has been tolerating well.

## 2022-04-03 ENCOUNTER — Ambulatory Visit: Payer: Medicare Other | Admitting: Neurology

## 2022-04-04 ENCOUNTER — Telehealth: Payer: Self-pay | Admitting: Family Medicine

## 2022-04-04 NOTE — Telephone Encounter (Signed)
Pt states the sleep med she is taking is not helping her sleep, she would like to know if she needs to take more or change to another RX.Please advise.

## 2022-04-04 NOTE — Telephone Encounter (Signed)
Please advise 

## 2022-04-05 NOTE — Telephone Encounter (Signed)
Recommend we try going to 100mg  nightly first before switching to a different medication. She should follow up with [place] in a couple of weeks.  Joan Mclaughlin. [name], MD 04/05/2022 10:02 PM

## 2022-04-09 NOTE — Telephone Encounter (Signed)
Pt called back and I related the message. All set.

## 2022-04-11 ENCOUNTER — Other Ambulatory Visit: Payer: Self-pay | Admitting: Family Medicine

## 2022-04-13 ENCOUNTER — Ambulatory Visit (INDEPENDENT_AMBULATORY_CARE_PROVIDER_SITE_OTHER): Payer: Medicare Other | Admitting: Family Medicine

## 2022-04-13 ENCOUNTER — Encounter: Payer: Self-pay | Admitting: Family Medicine

## 2022-04-13 VITALS — BP 153/81 | HR 74 | Temp 97.8°F | Ht 62.0 in | Wt 135.2 lb

## 2022-04-13 DIAGNOSIS — I10 Essential (primary) hypertension: Secondary | ICD-10-CM | POA: Diagnosis not present

## 2022-04-13 DIAGNOSIS — G4700 Insomnia, unspecified: Secondary | ICD-10-CM

## 2022-04-13 NOTE — Assessment & Plan Note (Signed)
Mildly elevated today though typically well-controlled.  Continue amlodipine 5 mg daily.  She will let us know if persistently elevated at home.

## 2022-04-13 NOTE — Assessment & Plan Note (Addendum)
She is having intolerable side effects with the trazodone and would like to discontinue.  She would like to try over-the-counter meds for now.  She is interested in trying CBD Gummies.  Told patient that I cannot recommend due to lack of scientific studies.  She can try taking melatonin and Unisom.  She will also try over-the-counter blaring.  She will follow-up in a few weeks.  Will consider trial of doxepin or ramelteon if still having ongoing issues.

## 2022-04-13 NOTE — Patient Instructions (Signed)
It was very nice to see you today!  It is ok for you to stop the trazodone.  You can use over-the-counter meds as needed for your sleep including Unisom.  Please let me know in a few weeks how you are doing.  Take care, Dr [name]  PLEASE NOTE:  If you had any lab tests, please let us know if you have not heard back within a few days. You may see your results on mychart before we have a chance to review them but we will give you a call once they are reviewed by [place].   If we ordered any referrals today, please let us know if you have not heard from their office within the next week.   If you had any urgent prescriptions sent in today, please check with the pharmacy within an hour of our visit to make sure the prescription was transmitted appropriately.   Please try these tips to maintain a healthy lifestyle:  Eat at least 3 REAL meals and 1-2 snacks per day.  Aim for no more than 5 hours between eating.  If you eat breakfast, please do so within one hour of getting up.   Each meal should contain half fruits/vegetables, one quarter protein, and one quarter carbs (no bigger than a computer mouse)  Cut down on sweet beverages. This includes juice, soda, and sweet tea.   Drink at least 1 glass of water with each meal and aim for at least 8 glasses per day  Exercise at least 150 minutes every week.

## 2022-04-13 NOTE — Progress Notes (Signed)
   Joan Mclaughlin is a 82 y.o. female who presents today for an office visit.  Assessment/Plan:  Chronic Problems Addressed Today: Insomnia She is having intolerable side effects with the trazodone and would like to discontinue.  She would like to try over-the-counter meds for now.  She is interested in trying CBD Gummies.  Told patient that I cannot recommend due to lack of scientific studies.  She can try taking melatonin and Unisom.  She will also try over-the-counter blaring.  She will follow-up in a few weeks.  Will consider trial of doxepin or ramelteon if still having ongoing issues.  Essential hypertension Mildly elevated today though typically well-controlled.  Continue amlodipine 5 mg daily.  She will let us know if persistently elevated at home.      Subjective:  HPI:  See A/p for status of chronic conditions.   She is here today for insomnia follow-up.  We last saw her couple weeks ago.  At her last visit we can continued her on trazodone.  She feels like this was causing some diarrhea and excessive drowsiness and she would not like to continue with this for now.  She is interested in trying other over-the-counter meds.      Objective:  Physical Exam: BP (!) 153/81   Pulse 74   Temp 97.8 F (36.6 C) (Temporal)   Ht 5\' 2"  (1.575 m)   Wt 135 lb 3.2 oz (61.3 kg)   SpO2 99%   BMI 24.73 kg/m   Gen: No acute distress, resting comfortably CV: Regular rate and rhythm with no murmurs appreciated Pulm: Normal work of breathing, clear to auscultation bilaterally with no crackles, wheezes, or rhonchi Neuro: Grossly normal, moves all extremities Psych: Normal affect and thought content      Elyse Prevo M. [name], MD 04/13/2022 12:29 PM

## 2022-04-15 DIAGNOSIS — R072 Precordial pain: Secondary | ICD-10-CM | POA: Diagnosis not present

## 2022-04-15 DIAGNOSIS — R030 Elevated blood-pressure reading, without diagnosis of hypertension: Secondary | ICD-10-CM | POA: Diagnosis not present

## 2022-04-15 DIAGNOSIS — K219 Gastro-esophageal reflux disease without esophagitis: Secondary | ICD-10-CM | POA: Diagnosis not present

## 2022-04-20 ENCOUNTER — Encounter: Payer: Self-pay | Admitting: Family

## 2022-04-20 ENCOUNTER — Ambulatory Visit (INDEPENDENT_AMBULATORY_CARE_PROVIDER_SITE_OTHER): Payer: Medicare Other | Admitting: Family

## 2022-04-20 VITALS — BP 135/78 | HR 70 | Temp 98.1°F | Ht 62.0 in | Wt 135.0 lb

## 2022-04-20 DIAGNOSIS — R195 Other fecal abnormalities: Secondary | ICD-10-CM

## 2022-04-20 DIAGNOSIS — G4700 Insomnia, unspecified: Secondary | ICD-10-CM | POA: Diagnosis not present

## 2022-04-20 DIAGNOSIS — J309 Allergic rhinitis, unspecified: Secondary | ICD-10-CM | POA: Diagnosis not present

## 2022-04-20 MED ORDER — TRIAMCINOLONE ACETONIDE 55 MCG/ACT NA AERO
1.0000 | INHALATION_SPRAY | Freq: Every day | NASAL | 2 refills | Status: DC
Start: 2022-04-20 — End: 2022-07-09

## 2022-04-20 NOTE — Assessment & Plan Note (Signed)
chronic - failed ambien & trazodone taking CBD gummy with Melatonin 5mg  & Hydroxyzine 20mg   feels dizzy when up during the night to urinate, usually about 3x & then sometimes can't go or a dribble comes out advised to cut the hydroxyzine to 10mg  can discuss Myrbetriq with PCP advised on sleep hygiene, cutting all fluids off at dinner time, no caffeine, no lights, cool room, etc

## 2022-04-20 NOTE — Assessment & Plan Note (Signed)
chronic  has used Zyrtec in past, not taking currently large amount of post nasal drip most likely contributing to current dry cough & nausea sending nasacort, advised on use & SE also recommend saline nasal spray tid & prior to Nasacort ok to also take Zyrtec if needed drink plenty of water

## 2022-04-20 NOTE — Progress Notes (Signed)
Patient ID: Joan Mclaughlin, female    DOB: [birth year], 82 y.o.   MRN: [id number]  Chief Complaint  Patient presents with   Sinus Problem    Pt c/o Cough, nausea, diarrhea, insomnia, dizziness and fatigue for a month when she had covid.    HPI:      Multiple symptoms:  pt thinks all her sx are related to having covid a month ago. Reports increased problems w/sleep,  Ambien & Trazodone caused her to be too loopy, has tried a CBD gummy which has helped some, also taking Hydroxyzine 20mg , still wakes up to urinate but only dribbles, also feels loopy during night when she wakes and has to hold onto walls & furniture. Reports loose stools, not watery, reports having normal BM qam, then has urge to go again and it is loose. Having some mild nausea, and continued dry cough, clearing her throat frequently. Reports having seasonal allergies and has taken Zyrtec in past.    Assessment & Plan:  Allergic rhinitis, unspecified seasonality, unspecified trigger Assessment & Plan: chronic  has used Zyrtec in past, not taking currently large amount of post nasal drip most likely contributing to current dry cough & nausea sending nasacort, advised on use & SE also recommend saline nasal spray tid & prior to Nasacort ok to also take Zyrtec if needed drink plenty of water  Orders: -     Triamcinolone Acetonide; Place 1 spray into the nose daily. Start with 1 spray each side twice a day for 3 days, then reduce to daily. Take to control allergy symptoms, cough, nasal drainage, congestion.  Dispense: 1 each; Refill: 2  Insomnia, unspecified type Assessment & Plan: chronic - failed ambien & trazodone taking CBD gummy with Melatonin 5mg  & Hydroxyzine 20mg   feels dizzy when up during the night to urinate, usually about 3x & then sometimes can't go or a dribble comes out advised to cut the hydroxyzine to 10mg  can discuss Myrbetriq with PCP advised on sleep hygiene, cutting all fluids off at dinner time,  no caffeine, no lights, cool room, etc  Loose stools - denies changes in meds, advised on diet to add bulk to stools, takes magnesium daily, advised to stop for a few days and then take qod.   Subjective:    Outpatient Medications Prior to Visit  Medication Sig Dispense Refill   acetic acid-hydrocortisone (VOSOL-HC) OTIC solution Place 3 drops into both ears 2 (two) times daily. 10 mL 0   ALPRAZolam (XANAX) 1 MG tablet TAKE 1/2 TABLET BY MOUTH EVERY DAY AT BEDTIME 15 tablet 5   amLODipine (NORVASC) 5 MG tablet TAKE 1 TABLET BY MOUTH EVERY DAY 90 tablet 1   Ascorbic Acid (VITAMIN C) 1000 MG tablet Take 1,000 mg by mouth daily.     b complex vitamins capsule Take 1 capsule by mouth daily.     CALCIUM-MAGNESIUM PO Take 1 tablet by mouth daily. 1000 mg / 500 mg     Cholecalciferol (VITAMIN D3) 50 MCG (2000 UT) capsule Take 2,000 Units by mouth daily.     hydrocortisone 2.5 % cream Apply topically 2 (two) times daily as needed (Rash). (Patient taking differently: Apply 1 application  topically 2 (two) times daily as needed (Rosacea).) 30 g 11   hydrOXYzine (ATARAX) 10 MG tablet Take 1-2 tablets (10-20 mg total) by mouth at bedtime. 180 tablet 2   ketoconazole (NIZORAL) 2 % shampoo USE AS DIRECTED 120 mL 3   Lysine 500 MG TABS Take 500  mg by mouth daily.     MELATONIN PO Take 1 capsule by mouth at bedtime.     Multiple Vitamin (MULTIVITAMIN WITH MINERALS) TABS tablet Take 1 tablet by mouth daily.     Multiple Vitamins-Minerals (ZINC PO) Take 90 mg by mouth daily.     Omega-3 Fatty Acids (FISH OIL PO) Take 630 mg by mouth daily.     omeprazole (PRILOSEC) 40 MG capsule Take 40 mg by mouth daily.     Oxcarbazepine (TRILEPTAL) 300 MG tablet TAKE 1 TABLET BY MOUTH TWICE DAILY 180 tablet 0   Potassium 99 MG TABS Take 99 mg by mouth daily.     Probiotic Product (PROBIOTIC DAILY) CAPS Take 1 capsule by mouth daily. Woman     Red Yeast Rice 600 MG CAPS Take 1,200 mg by mouth at bedtime. With Co-Q-10  100 mg     selenium 200 MCG TABS tablet Take 200 mcg by mouth daily.     sulfamethoxazole-trimethoprim (BACTRIM DS) 800-160 MG tablet Take 1 tablet by mouth 2 (two) times daily. 14 tablet 0   triamcinolone cream (KENALOG) 0.1 % Apply 1 Application topically daily. 30 g 5   TURMERIC PO Take 600 mg by mouth daily.     valACYclovir (VALTREX) 500 MG tablet TAKE 1 TABLET BY MOUTH DAILY AS NEEDED (COLD SORES). 90 tablet 1   No facility-administered medications prior to visit.   Past Medical History:  Diagnosis Date   Alcoholism    sober for 28 years-noted 09/16/2018   Allergy    Anxiety    Cancer    skin cancer   Cholecystitis    Complication of anesthesia    Difficulty sleeping    takes med to sleep   Diverticulitis    Family history of adverse reaction to anesthesia    SIster - N/V   Gallstones    Ganglion cyst 12/11/2016   GERD (gastroesophageal reflux disease)    "RELATED TO GALLBLADDER"   Heart murmur    "ONLY DETECTED WHEN LYING DOWN"  - 10/16/2018- Not concerned with   History of Lyme disease 09/19/2016   Hyperlipidemia    Hypertension    Lyme disease    Pt states she has had Lyme Disease twice.    Nocturia    Pneumonia    PONV (postoperative nausea and vomiting)    Nausea   S/P laparoscopic cholecystectomy 12/23/2014   Seizure disorder    Seizures    Seizures    "last one 10- 15 years ago - 10/16/2018   Squamous cell carcinoma of skin 12/25/2018   RIGHT FOREARM    Squamous cell carcinoma of skin 09/30/2019   in situ-left lower leg-anterior   Trimalleolar fracture    left   Tubular adenoma of colon 06/27/2018   Past Surgical History:  Procedure Laterality Date   CHOLECYSTECTOMY N/A 12/23/2014   Procedure: LAPAROSCOPIC CHOLECYSTECTOMY WITH INTRAOPERATIVE CHOLANGIOGRAM;  Surgeon: Gaynelle Adu, MD;  Location: [facility];  Service: General;  Laterality: N/A;   COLONOSCOPY     "3 times trying to get large polyp since August 2020"   COLONOSCOPY WITH PROPOFOL N/A 10/20/2018    Procedure: COLONOSCOPY WITH PROPOFOL;  Surgeon: Lemar Lofty., MD;  Location: [facility] ENDOSCOPY;  Service: Gastroenterology;  Laterality: N/A;   COLONOSCOPY WITH PROPOFOL N/A 11/02/2019   Procedure: COLONOSCOPY WITH PROPOFOL;  Surgeon: Meridee Score Netty Starring., MD;  Location: [facility] ENDOSCOPY;  Service: Gastroenterology;  Laterality: N/A;   COLONOSCOPY WITH PROPOFOL N/A 08/31/2020   Procedure: COLONOSCOPY WITH  PROPOFOL;  Surgeon: Lemar Lofty., MD;  Location: [facility] ENDOSCOPY;  Service: Gastroenterology;  Laterality: N/A;   ENDOSCOPIC MUCOSAL RESECTION N/A 10/20/2018   Procedure: ENDOSCOPIC MUCOSAL RESECTION;  Surgeon: Meridee Score Netty Starring., MD;  Location: [facility] ENDOSCOPY;  Service: Gastroenterology;  Laterality: N/A;   ENDOSCOPIC MUCOSAL RESECTION  11/02/2019   Procedure: ENDOSCOPIC MUCOSAL RESECTION;  Surgeon: Meridee Score Netty Starring., MD;  Location: [facility] ENDOSCOPY;  Service: Gastroenterology;;   HEMOSTASIS CLIP PLACEMENT  10/20/2018   Procedure: HEMOSTASIS CLIP PLACEMENT;  Surgeon: Lemar Lofty., MD;  Location: [facility] ENDOSCOPY;  Service: Gastroenterology;;   HEMOSTASIS CLIP PLACEMENT  11/02/2019   Procedure: HEMOSTASIS CLIP PLACEMENT;  Surgeon: Lemar Lofty., MD;  Location[facility] ENDOSCOPY;  Service: Gastroenterology;;   ORIF ANKLE FRACTURE Left 02/19/2019   Procedure: OPEN REDUCTION INTERNAL FIXATION (ORIF) LEFT ANKLE FRACTURE;  Surgeon: Terance Hart, MD;  Location: [facility] OR;  Service: Orthopedics;  Laterality: Left;   POLYPECTOMY  10/20/2018   Procedure: POLYPECTOMY;  Surgeon: Mansouraty, Netty Starring., MD;  Location: [facility] ENDOSCOPY;  Service: Gastroenterology;;   POLYPECTOMY  11/02/2019   Procedure: POLYPECTOMY;  Surgeon: Lemar Lofty., MD;  Location: [facility] ENDOSCOPY;  Service: Gastroenterology;;   POLYPECTOMY  08/31/2020   Procedure: POLYPECTOMY;  Surgeon: Lemar Lofty., MD;  Location: [facility] ENDOSCOPY;  Service: Gastroenterology;;   SUBMUCOSAL LIFTING INJECTION   10/20/2018   Procedure: SUBMUCOSAL LIFTING INJECTION;  Surgeon: Lemar Lofty., MD;  Location[facility]COPY;  Service: Gastroenterology;;   SYNDESMOSIS REPAIR Left 02/19/2019   Procedure: SYNDESMOSIS REPAIR;  Surgeon: Terance Hart, MD;  Location: [facility] OR;  Service: Orthopedics;  Laterality: Left;   THORACIC DISCECTOMY  07/2007   T12   TUBAL LIGATION     WISDOM TOOTH EXTRACTION     Allergies  Allergen Reactions   Amoxicillin-Pot Clavulanate Nausea And Vomiting    Patient denies knowledge of this allergy  Did it involve swelling of the face/tongue/throat, SOB, or low BP? No Did it involve sudden or severe rash/hives, skin peeling, or any reaction on the inside of your mouth or nose? No Did you need to seek medical attention at a hospital or doctor's office? No When did it last happen?      in 2016 If all above answers are "NO", may proceed with cephalosporin use.    Doxycycline Hives   Suvorexant Other (See Comments)    Nightmares       Objective:    Physical Exam Vitals and nursing note reviewed.  Constitutional:      Appearance: Normal appearance.  Cardiovascular:     Rate and Rhythm: Normal rate and regular rhythm.  Pulmonary:     Effort: Pulmonary effort is normal.     Breath sounds: Normal breath sounds.  Musculoskeletal:        General: Normal range of motion.  Skin:    General: Skin is warm and dry.  Neurological:     Mental Status: She is alert.  Psychiatric:        Mood and Affect: Mood normal.        Behavior: Behavior normal.    BP 135/78 (BP Location: Left Arm, Patient Position: Sitting, Cuff Size: Normal)   Pulse 70   Temp 98.1 F (36.7 C) (Temporal)   Ht  (1.575 m)   Wt 135 lb (61.2 kg)   SpO2 95%   BMI 24.69 kg/m  Wt Readings from Last 3 Encounters:  04/20/22 135 lb (61.2 kg)  04/13/22 135 lb 3.2 oz (61.3 kg)  03/29/22 136 lb 6.4 oz (61.9 kg)      Dulce Sellar, NP

## 2022-05-07 ENCOUNTER — Telehealth: Payer: Self-pay | Admitting: Family Medicine

## 2022-05-07 NOTE — Telephone Encounter (Signed)
Prescription Request  05/07/2022  LOV: 04/13/2022  What is the name of the medication or equipment? benzonatate (TESSALON) 200 MG capsule   Have you contacted your pharmacy to request a refill? Yes   Which pharmacy would you like this sent to?  [facility] [place], [place] - [address] [facility] [address] [place] [address] Phone: [phone] Fax: [phone]    Patient notified that their request is being sent to the clinical staff for review and that they should receive a response within 2 business days.   Please advise at Mobile [phone] (mobile)

## 2022-05-07 NOTE — Telephone Encounter (Signed)
Medication not on patients current med list. OK to order? Please advise

## 2022-05-08 ENCOUNTER — Ambulatory Visit (INDEPENDENT_AMBULATORY_CARE_PROVIDER_SITE_OTHER): Payer: Medicare Other | Admitting: Family Medicine

## 2022-05-08 ENCOUNTER — Encounter: Payer: Self-pay | Admitting: Family Medicine

## 2022-05-08 VITALS — BP 137/74 | HR 73 | Temp 98.0°F | Ht 62.0 in | Wt 135.6 lb

## 2022-05-08 DIAGNOSIS — R059 Cough, unspecified: Secondary | ICD-10-CM

## 2022-05-08 DIAGNOSIS — I10 Essential (primary) hypertension: Secondary | ICD-10-CM | POA: Diagnosis not present

## 2022-05-08 DIAGNOSIS — J309 Allergic rhinitis, unspecified: Secondary | ICD-10-CM | POA: Diagnosis not present

## 2022-05-08 MED ORDER — ALBUTEROL SULFATE HFA 108 (90 BASE) MCG/ACT IN AERS: 2.0000 | INHALATION_SPRAY | Freq: Four times a day (QID) | RESPIRATORY_TRACT | 0 refills | Status: DC | PRN

## 2022-05-08 MED ORDER — BENZONATATE 200 MG PO CAPS: 200.0000 mg | ORAL_CAPSULE | Freq: Two times a day (BID) | ORAL | 0 refills | Status: DC | PRN

## 2022-05-08 NOTE — Assessment & Plan Note (Signed)
Potentially contributing to her above cough.  She can continue Flonase and OTC antihistamines.  May consider addition of Singulair if this continues to be an issue.

## 2022-05-08 NOTE — Progress Notes (Signed)
   Maurita Havener is a 82 y.o. female who presents today for an office visit.  Assessment/Plan:  New/Acute Problems: Cough No red flags.  Likely postinfectious cough secondary to COVID from a couple of months ago.  She has reassuring lung exam today.  No signs of recurrent infection today. We discussed natural course of recover and that it may take several weeks to months for this to fully resolve.  Sounds like she may be having some bronchospasm episodes.  Will try albuterol inhaler.  She will let us know if she does not notice improvement over the next few weeks.  Would consider imaging or referral to pulmonology at that time.  We discussed reasons to return to care.  Also send in [place].  Chronic Problems Addressed Today: Essential hypertension At goal on amlodipine 5 mg daily.  Allergic rhinitis Potentially contributing to her above cough.  She can continue Flonase and OTC antihistamines.  May consider addition of Singulair if this continues to be an issue.     Subjective:  HPI:  See A/P for status of chronic conditions.  Patient is here with cough.  She was diagnosed with covid a couple of months ago. Most of the symptoms from this have resolved but she is still having a cough with clear mucus.  She did have coughing fits periodically.  Cough is productive of clear mucus.  No chest pain or shortness of breath. Some fatigue. No wheezing. Some nausea. Gagging with cough. Still some chills.        Objective:  Physical Exam: BP 137/74   Pulse 73   Temp 98 F (36.7 C) (Temporal)   Ht 5\' 2"  (1.575 m)   Wt 135 lb 9.6 oz (61.5 kg)   SpO2 97%   BMI 24.80 kg/m   Gen: No acute distress, resting comfortably CV: Regular rate and rhythm with no murmurs appreciated Pulm: Normal work of breathing, clear to auscultation bilaterally with no crackles, wheezes, or rhonchi Neuro: Grossly normal, moves all extremities Psych: Normal affect and thought content      Osaze Hubbert M. [name],  MD 05/08/2022 1:59 PM

## 2022-05-08 NOTE — Telephone Encounter (Signed)
Patient has OV with PCP today  

## 2022-05-08 NOTE — Patient Instructions (Signed)
It was very nice to see you today!  Your exam today is normal.  It is common to have cough for several months afterwards.  Please try the albuterol inhaler.  Let me know if not improving in the next few weeks.  Return if symptoms worsen or fail to improve.   Take care, Dr [name]  PLEASE NOTE:  If you had any lab tests, please let us know if you have not heard back within a few days. You may see your results on mychart before we have a chance to review them but we will give you a call once they are reviewed by [place].   If we ordered any referrals today, please let us know if you have not heard from their office within the next week.   If you had any urgent prescriptions sent in today, please check with the pharmacy within an hour of our visit to make sure the prescription was transmitted appropriately.   Please try these tips to maintain a healthy lifestyle:  Eat at least 3 REAL meals and 1-2 snacks per day.  Aim for no more than 5 hours between eating.  If you eat breakfast, please do so within one hour of getting up.   Each meal should contain half fruits/vegetables, one quarter protein, and one quarter carbs (no bigger than a computer mouse)  Cut down on sweet beverages. This includes juice, soda, and sweet tea.   Drink at least 1 glass of water with each meal and aim for at least 8 glasses per day  Exercise at least 150 minutes every week.

## 2022-05-08 NOTE — Telephone Encounter (Signed)
Ok with me. Please place any necessary orders. 

## 2022-05-08 NOTE — Assessment & Plan Note (Signed)
At goal on amlodipine 5 mg daily. 

## 2022-05-14 ENCOUNTER — Other Ambulatory Visit: Payer: Self-pay | Admitting: Family Medicine

## 2022-05-14 ENCOUNTER — Other Ambulatory Visit: Payer: Self-pay | Admitting: Neurology

## 2022-05-14 DIAGNOSIS — G40009 Localization-related (focal) (partial) idiopathic epilepsy and epileptic syndromes with seizures of localized onset, not intractable, without status epilepticus: Secondary | ICD-10-CM

## 2022-05-18 ENCOUNTER — Ambulatory Visit (INDEPENDENT_AMBULATORY_CARE_PROVIDER_SITE_OTHER): Payer: Medicare Other | Admitting: Family Medicine

## 2022-05-18 ENCOUNTER — Encounter: Payer: Self-pay | Admitting: Family Medicine

## 2022-05-18 VITALS — BP 126/79 | HR 77 | Temp 97.8°F | Ht 62.0 in | Wt 134.2 lb

## 2022-05-18 DIAGNOSIS — L219 Seborrheic dermatitis, unspecified: Secondary | ICD-10-CM

## 2022-05-18 DIAGNOSIS — I10 Essential (primary) hypertension: Secondary | ICD-10-CM | POA: Diagnosis not present

## 2022-05-18 MED ORDER — KETOCONAZOLE 2 % EX SHAM: MEDICATED_SHAMPOO | CUTANEOUS | 5 refills | Status: DC

## 2022-05-18 MED ORDER — AEROCHAMBER PLUS FLO-VU MEDIUM MISC: 1.0000 | Freq: Once | 0 refills | Status: AC

## 2022-05-18 NOTE — Assessment & Plan Note (Signed)
BP at goal on amlodipine 5mg daily.  

## 2022-05-18 NOTE — Assessment & Plan Note (Signed)
Follows with dermatology however does not have an appointment with them for a couple of months.  Her previous dermatologist retired..  She needs refill on ketoconazole shampoo.  Will refill today.

## 2022-05-18 NOTE — Patient Instructions (Addendum)
It was very nice to see you today!  I will refill your improved.  Please try using the spacer to help with the inhaler.  Return if symptoms worsen or fail to improve.   Take care, Dr [name]  PLEASE NOTE:  If you had any lab tests, please let us know if you have not heard back within a few days. You may see your results on mychart before we have a chance to review them but we will give you a call once they are reviewed by [place].   If we ordered any referrals today, please let us know if you have not heard from their office within the next week.   If you had any urgent prescriptions sent in today, please check with the pharmacy within an hour of our visit to make sure the prescription was transmitted appropriately.   Please try these tips to maintain a healthy lifestyle:  Eat at least 3 REAL meals and 1-2 snacks per day.  Aim for no more than 5 hours between eating.  If you eat breakfast, please do so within one hour of getting up.   Each meal should contain half fruits/vegetables, one quarter protein, and one quarter carbs (no bigger than a computer mouse)  Cut down on sweet beverages. This includes juice, soda, and sweet tea.   Drink at least 1 glass of water with each meal and aim for at least 8 glasses per day  Exercise at least 150 minutes every week.

## 2022-05-18 NOTE — Progress Notes (Signed)
   Joan Mclaughlin is a 82 y.o. female who presents today for an office visit.  Assessment/Plan:  New/Acute Problems: Cough No red flags.  Symptoms have improved slightly since our last visit.  She has been using albuterol but does not sound like she is adequately getting listen to her lungs.  We will send a prescription in for a spacer to help her with this.  She is no longer taking the Tessalon as it was not effective.  We discussed that the postinfectious cough can take several weeks to months to fully heal up.  She will let us know if symptoms do not continue to improve.  Chronic Problems Addressed Today: Seborrheic dermatitis Follows with dermatology however does not have an appointment with them for a couple of months.  Her previous dermatologist retired..  She needs refill on ketoconazole shampoo.  Will refill today.    Essential hypertension BP at goal on amlodipine 5 mg daily.     Subjective:  HPI:  See Assessment / plan for status of chronic conditions.   We last saw her about a week and a half ago.  Was having some cough issues.  Tessalon did not help.  Cough has improved slightly.  She has been using albuterol but is not sure if she is doing it properly.  States that she is progressing inhaler down to actuated and then inhaling afterwards.  She is having difficulty with inhaling the medication at the same time as actuating the inhaler.  She needs refill on ketoconazole shampoo today.       Objective:  Physical Exam: BP 126/79   Pulse 77   Temp 97.8 F (36.6 C) (Temporal)   Ht 5\' 2"  (1.575 m)   Wt 134 lb 3.2 oz (60.9 kg)   SpO2 98%   BMI 24.55 kg/m   Gen: No acute distress, resting comfortably Neuro: Grossly normal, moves all extremities Psych: Normal affect and thought content      Denim Start M. [name], MD 05/18/2022 8:43 AM

## 2022-05-23 ENCOUNTER — Telehealth: Payer: Self-pay | Admitting: Family Medicine

## 2022-05-23 NOTE — Telephone Encounter (Signed)
Prescription Request  05/23/2022  LOV: 05/18/2022  What is the name of the medication or equipment?  Oxcarbazepine (TRILEPTAL) 300 MG tablet   Have you contacted your pharmacy to request a refill? No   Which pharmacy would you like this sent to?  [facility] [place], [place] - [address] [place] [address] Phone: [phone] Fax: [phone]    Patient notified that their request is being sent to the clinical staff for review and that they should receive a response within 2 business days.   Please advise at Mobile [phone] (mobile)

## 2022-05-23 NOTE — Telephone Encounter (Signed)
Last refill by Dr Karel Jarvis

## 2022-05-28 ENCOUNTER — Telehealth: Payer: Self-pay | Admitting: Family Medicine

## 2022-05-28 NOTE — Telephone Encounter (Signed)
Prescription Request  05/28/2022  LOV: 05/18/2022  What is the name of the medication or equipment?  amLODipine (NORVASC) 5 MG tablet   Have you contacted your pharmacy to request a refill? No   Which pharmacy would you like this sent to?  [facility] [place], [place] - [address] [place] [address] Phone: [phone] Fax: [phone]    Patient notified that their request is being sent to the clinical staff for review and that they should receive a response within 2 business days.   Please advise at Mobile [phone] (mobile)

## 2022-05-29 ENCOUNTER — Telehealth: Payer: Self-pay | Admitting: Neurology

## 2022-05-29 ENCOUNTER — Other Ambulatory Visit: Payer: Self-pay | Admitting: *Deleted

## 2022-05-29 DIAGNOSIS — G40009 Localization-related (focal) (partial) idiopathic epilepsy and epileptic syndromes with seizures of localized onset, not intractable, without status epilepticus: Secondary | ICD-10-CM

## 2022-05-29 MED ORDER — AMLODIPINE BESYLATE 5 MG PO TABS: 5.0000 mg | ORAL_TABLET | Freq: Every day | ORAL | 1 refills | Status: DC

## 2022-05-29 MED ORDER — OXCARBAZEPINE 300 MG PO TABS
ORAL_TABLET | ORAL | 0 refills | Status: DC
Start: 2022-05-29 — End: 2022-07-27

## 2022-05-29 NOTE — Telephone Encounter (Signed)
1. Which medications need refilled? (List name and dosage, if known) oxcarbazepine  2. Which pharmacy/location is medication to be sent to? (include street and city if local pharmacy) [employer] at [facility] - was sent to [facility] in error previously

## 2022-05-29 NOTE — Telephone Encounter (Signed)
Rx send to [facility]  

## 2022-05-29 NOTE — Telephone Encounter (Signed)
Refill sent to correct pharmacy

## 2022-05-30 ENCOUNTER — Telehealth: Payer: Self-pay | Admitting: Family Medicine

## 2022-05-30 NOTE — Telephone Encounter (Signed)
Prescription Request  05/30/2022  LOV: 05/18/2022  What is the name of the medication or equipment?  benzonatate (TESSALON) 200 MG capsule    Have you contacted your pharmacy to request a refill? Yes   Which pharmacy would you like this sent to?  [facility] [place], [place] - [address] [place] [address] Phone: [phone] Fax: [phone]    Patient notified that their request is being sent to the clinical staff for review and that they should receive a response within 2 business days.   Please advise at Mobile [phone] (mobile)

## 2022-05-31 ENCOUNTER — Other Ambulatory Visit: Payer: Self-pay | Admitting: *Deleted

## 2022-05-31 MED ORDER — BENZONATATE 200 MG PO CAPS: 200.0000 mg | ORAL_CAPSULE | Freq: Two times a day (BID) | ORAL | 0 refills | Status: DC | PRN

## 2022-05-31 NOTE — Telephone Encounter (Signed)
Please advise 

## 2022-05-31 NOTE — Telephone Encounter (Signed)
Rx send to [facility]  

## 2022-05-31 NOTE — Telephone Encounter (Signed)
Ok with me. Please place any necessary orders. 

## 2022-07-09 ENCOUNTER — Ambulatory Visit (INDEPENDENT_AMBULATORY_CARE_PROVIDER_SITE_OTHER): Payer: Medicare Other | Admitting: Family Medicine

## 2022-07-09 VITALS — BP 136/78 | HR 73 | Temp 97.0°F | Ht 62.0 in | Wt 139.5 lb

## 2022-07-09 DIAGNOSIS — I10 Essential (primary) hypertension: Secondary | ICD-10-CM

## 2022-07-09 DIAGNOSIS — L0390 Cellulitis, unspecified: Secondary | ICD-10-CM | POA: Diagnosis not present

## 2022-07-09 MED ORDER — MUPIROCIN 2 % EX OINT: 1.0000 | TOPICAL_OINTMENT | CUTANEOUS | 1 refills | Status: DC | PRN

## 2022-07-09 MED ORDER — CEPHALEXIN 500 MG PO CAPS: 500.0000 mg | ORAL_CAPSULE | Freq: Four times a day (QID) | ORAL | 0 refills | Status: AC

## 2022-07-09 NOTE — Patient Instructions (Addendum)
It was very nice to see you today!  We placed Steri-Strips today.  Start the Keflex.  Let us know if not improving.  Return if symptoms worsen or fail to improve.   Take care, Dr [name]  PLEASE NOTE:  If you had any lab tests, please let us know if you have not heard back within a few days. You may see your results on mychart before we have a chance to review them but we will give you a call once they are reviewed by [place].   If we ordered any referrals today, please let us know if you have not heard from their office within the next week.   If you had any urgent prescriptions sent in today, please check with the pharmacy within an hour of our visit to make sure the prescription was transmitted appropriately.   Please try these tips to maintain a healthy lifestyle:  Eat at least 3 REAL meals and 1-2 snacks per day.  Aim for no more than 5 hours between eating.  If you eat breakfast, please do so within one hour of getting up.   Each meal should contain half fruits/vegetables, one quarter protein, and one quarter carbs (no bigger than a computer mouse)  Cut down on sweet beverages. This includes juice, soda, and sweet tea.   Drink at least 1 glass of water with each meal and aim for at least 8 glasses per day  Exercise at least 150 minutes every week.

## 2022-07-09 NOTE — Progress Notes (Signed)
   Joan Mclaughlin is a 82 y.o. female who presents today for an office visit.  Assessment/Plan:  New/Acute Problems: Cellulitis No red flags.  No signs of systemic illness.  Redness and erythema do seem to be worsening the last 2 days.  Will start Keflex 500 mg 4 times daily x 7 days.  We discussed wound care  Skin tear Repaired with Steri-Strips today.  See below note.  She tolerated well.  We discussed wound care.  Anticipate this will take a few weeks to fully heal up.  We discussed reasons to return to care.  Follow-up as needed.  Chronic Problems Addressed Today: Essential hypertension Blood pressure at goal on amlodipine 5 mg daily.     Subjective:  HPI:  Patient here with injury to her right shin about a week ago. She got caught the corner of a car door while shutting it.  Immediate pain and bleeding there.  She did use alcohol to the area.  No the last 2 days she noticed more redness and pain.  No fevers or chills.       Objective:  Physical Exam: BP 136/78 (BP Location: Left Arm, Patient Position: Sitting, Cuff Size: Normal)   Pulse 73   Temp (!) 97 F (36.1 C) (Temporal)   Ht 5\' 2"  (1.575 m)   Wt 139 lb 8 oz (63.3 kg)   SpO2 97%   BMI 25.51 kg/m   Gen: No acute distress, resting comfortably Skin: 2 approximately 1 cm skin tears on right anterior shin.  Small amount of surrounding erythema.  No purulent discharge.  Tender to palpation along edges of skin tears. Neuro: Grossly normal, moves all extremities Psych: Normal affect and thought content  Procedure note Verbal consent obtained.  Steri-Strips were placed on both skin tears.  She tolerated well.      Katina Degree. [name], MD 07/09/2022 3:01 PM

## 2022-07-09 NOTE — Assessment & Plan Note (Signed)
Blood pressure at goal on amlodipine 5 mg daily. 

## 2022-07-19 ENCOUNTER — Ambulatory Visit (INDEPENDENT_AMBULATORY_CARE_PROVIDER_SITE_OTHER): Payer: Medicare Other

## 2022-07-19 VITALS — BP 130/80 | HR 74 | Temp 98.7°F | Wt 139.0 lb

## 2022-07-19 DIAGNOSIS — Z0000 Encounter for general adult medical examination without abnormal findings: Secondary | ICD-10-CM

## 2022-07-19 NOTE — Patient Instructions (Signed)
Joan Mclaughlin , Thank you for taking time to come for your Medicare Wellness Visit. I appreciate your ongoing commitment to your health goals. Please review the following plan we discussed and let me know if I can assist you in the future.   These are the goals we discussed:  Goals      get back to silver sneakers     Patient Stated     Lose 7- 10 lbs         This is a list of the screening recommended for you and due dates:  Health Maintenance  Topic Date Due   Medicare Annual Wellness Visit  02/19/2023   DTaP/Tdap/Td vaccine (2 - Td or Tdap) 10/23/2027   Pneumonia Vaccine  Completed   Zoster (Shingles) Vaccine  Completed   HPV Vaccine  Aged Out   Flu Shot  Discontinued   DEXA scan (bone density measurement)  Discontinued   COVID-19 Vaccine  Discontinued    Advanced directives: copies in chart   Conditions/risks identified: get down to 135 or below   Next appointment: Follow up in one year for your annual wellness visit    Preventive Care 65 Years and Older, Female Preventive care refers to lifestyle choices and visits with your health care provider that can promote health and wellness. What does preventive care include? A yearly physical exam. This is also called an annual well check. Dental exams once or twice a year. Routine eye exams. Ask your health care provider how often you should have your eyes checked. Personal lifestyle choices, including: Daily care of your teeth and gums. Regular physical activity. Eating a healthy diet. Avoiding tobacco and drug use. Limiting alcohol use. Practicing safe sex. Taking low-dose aspirin every day. Taking vitamin and mineral supplements as recommended by your health care provider. What happens during an annual well check? The services and screenings done by your health care provider during your annual well check will depend on your age, overall health, lifestyle risk factors, and family history of disease. Counseling  Your  health care provider may ask you questions about your: Alcohol use. Tobacco use. Drug use. Emotional well-being. Home and relationship well-being. Sexual activity. Eating habits. History of falls. Memory and ability to understand (cognition). Work and work Astronomer. Reproductive health. Screening  You may have the following tests or measurements: Height, weight, and BMI. Blood pressure. Lipid and cholesterol levels. These may be checked every 5 years, or more frequently if you are over 3 years old. Skin check. Lung cancer screening. You may have this screening every year starting at age 42 if you have a 30-pack-year history of smoking and currently smoke or have quit within the past 15 years. Fecal occult blood test (FOBT) of the stool. You may have this test every year starting at age 31. Flexible sigmoidoscopy or colonoscopy. You may have a sigmoidoscopy every 5 years or a colonoscopy every 10 years starting at age 93. Hepatitis C blood test. Hepatitis B blood test. Sexually transmitted disease (STD) testing. Diabetes screening. This is done by checking your blood sugar (glucose) after you have not eaten for a while (fasting). You may have this done every 1-3 years. Bone density scan. This is done to screen for osteoporosis. You may have this done starting at age 82. Mammogram. This may be done every 1-2 years. Talk to your health care provider about how often you should have regular mammograms. Talk with your health care provider about your test results, treatment options, and  if necessary, the need for more tests. Vaccines  Your health care provider may recommend certain vaccines, such as: Influenza vaccine. This is recommended every year. Tetanus, diphtheria, and acellular pertussis (Tdap, Td) vaccine. You may need a Td booster every 10 years. Zoster vaccine. You may need this after age 62. Pneumococcal 13-valent conjugate (PCV13) vaccine. One dose is recommended after age  57. Pneumococcal polysaccharide (PPSV23) vaccine. One dose is recommended after age 85. Talk to your health care provider about which screenings and vaccines you need and how often you need them. This information is not intended to replace advice given to you by your health care provider. Make sure you discuss any questions you have with your health care provider. Document Released: 01/21/2015 Document Revised: 09/14/2015 Document Reviewed: 10/26/2014 Elsevier Interactive Patient Education  2017 [employer].  Fall Prevention in the Home Falls can cause injuries. They can happen to people of all ages. There are many things you can do to make your home safe and to help prevent falls. What can I do on the outside of my home? Regularly fix the edges of walkways and driveways and fix any cracks. Remove anything that might make you trip as you walk through a door, such as a raised step or threshold. Trim any bushes or trees on the path to your home. Use bright outdoor lighting. Clear any walking paths of anything that might make someone trip, such as rocks or tools. Regularly check to see if handrails are loose or broken. Make sure that both sides of any steps have handrails. Any raised decks and porches should have guardrails on the edges. Have any leaves, snow, or ice cleared regularly. Use sand or salt on walking paths during winter. Clean up any spills in your garage right away. This includes oil or grease spills. What can I do in the bathroom? Use night lights. Install grab bars by the toilet and in the tub and shower. Do not use towel bars as grab bars. Use non-skid mats or decals in the tub or shower. If you need to sit down in the shower, use a plastic, non-slip stool. Keep the floor dry. Clean up any water that spills on the floor as soon as it happens. Remove soap buildup in the tub or shower regularly. Attach bath mats securely with double-sided non-slip rug tape. Do not have throw  rugs and other things on the floor that can make you trip. What can I do in the bedroom? Use night lights. Make sure that you have a light by your bed that is easy to reach. Do not use any sheets or blankets that are too big for your bed. They should not hang down onto the floor. Have a firm chair that has side arms. You can use this for support while you get dressed. Do not have throw rugs and other things on the floor that can make you trip. What can I do in the kitchen? Clean up any spills right away. Avoid walking on wet floors. Keep items that you use a lot in easy-to-reach places. If you need to reach something above you, use a strong step stool that has a grab bar. Keep electrical cords out of the way. Do not use floor polish or wax that makes floors slippery. If you must use wax, use non-skid floor wax. Do not have throw rugs and other things on the floor that can make you trip. What can I do with my stairs? Do not leave any  items on the stairs. Make sure that there are handrails on both sides of the stairs and use them. Fix handrails that are broken or loose. Make sure that handrails are as long as the stairways. Check any carpeting to make sure that it is firmly attached to the stairs. Fix any carpet that is loose or worn. Avoid having throw rugs at the top or bottom of the stairs. If you do have throw rugs, attach them to the floor with carpet tape. Make sure that you have a light switch at the top of the stairs and the bottom of the stairs. If you do not have them, ask someone to add them for you. What else can I do to help prevent falls? Wear shoes that: Do not have high heels. Have rubber bottoms. Are comfortable and fit you well. Are closed at the toe. Do not wear sandals. If you use a stepladder: Make sure that it is fully opened. Do not climb a closed stepladder. Make sure that both sides of the stepladder are locked into place. Ask someone to hold it for you, if  possible. Clearly mark and make sure that you can see: Any grab bars or handrails. First and last steps. Where the edge of each step is. Use tools that help you move around (mobility aids) if they are needed. These include: Canes. Walkers. Scooters. Crutches. Turn on the lights when you go into a dark area. Replace any light bulbs as soon as they burn out. Set up your furniture so you have a clear path. Avoid moving your furniture around. If any of your floors are uneven, fix them. If there are any pets around you, be aware of where they are. Review your medicines with your doctor. Some medicines can make you feel dizzy. This can increase your chance of falling. Ask your doctor what other things that you can do to help prevent falls. This information is not intended to replace advice given to you by your health care provider. Make sure you discuss any questions you have with your health care provider. Document Released: 10/21/2008 Document Revised: 06/02/2015 Document Reviewed: 01/29/2014 Elsevier Interactive Patient Education  2017 [employer].

## 2022-07-19 NOTE — Progress Notes (Signed)
Subjective:   Joan Mclaughlin is a 82 y.o. female who presents for Medicare Annual (Subsequent) preventive examination.  Visit Complete: Virtual  I connected with  [name] on 07/19/22 by a audio enabled telemedicine application and verified that I am speaking with the correct person using two identifiers.  Patient Location: Home  Provider Location: Office/Clinic  I discussed the limitations of evaluation and management by telemedicine. The patient expressed understanding and agreed to proceed.  Review of Systems     Cardiac Risk Factors include: advanced age (>62men, >72 women);hypertension;dyslipidemia     Objective:    Today's Vitals   07/19/22 1007  BP: 130/80  Pulse: 74  Temp: 98.7 F (37.1 C)  SpO2: 95%  Weight: 139 lb (63 kg)   Body mass index is 25.42 kg/m.     07/19/2022   10:16 AM 07/06/2021    9:55 AM 11/22/2020    2:35 PM 08/31/2020    7:00 AM 07/01/2020    3:29 PM 03/10/2020    2:02 PM 02/26/2020    3:36 PM  Advanced Directives  Does Patient Have a Medical Advance Directive? Yes Yes Yes No Yes Yes Yes  Type of Estate agent of [place];Living will Healthcare Power of [employer] Power of [employer] Power of [employer] Power of Attorney Healthcare Power of Attorney  Does patient want to make changes to medical advance directive? No - Patient declined        Copy of Healthcare Power of Attorney in Chart? Yes - validated most recent copy scanned in chart (See row information) Yes - validated most recent copy scanned in chart (See row information)   Yes - validated most recent copy scanned in chart (See row information) Yes - validated most recent copy scanned in chart (See row information)   Would patient like information on creating a medical advance directive?    No - Patient declined       Current Medications (verified) Outpatient Encounter Medications as of 07/19/2022  Medication Sig    amLODipine (NORVASC) 5 MG tablet Take 1 tablet (5 mg total) by mouth daily.   Ascorbic Acid (VITAMIN C) 1000 MG tablet Take 1,000 mg by mouth daily.   b complex vitamins capsule Take 1 capsule by mouth daily.   CALCIUM-MAGNESIUM PO Take 1 tablet by mouth daily. 1000 mg / 500 mg   Cholecalciferol (VITAMIN D3) 50 MCG (2000 UT) capsule Take 2,000 Units by mouth daily.   hydrocortisone 2.5 % cream Apply topically 2 (two) times daily as needed (Rash). (Patient taking differently: Apply 1 application  topically 2 (two) times daily as needed (Rosacea).)   hydrOXYzine (ATARAX) 10 MG tablet TAKE 1 TO 2 TABLETS (10-20 MG TOTAL) BY MOUTH AT BEDTIME   ketoconazole (NIZORAL) 2 % shampoo Use 2-3 times weekly as needed   Lysine 500 MG TABS Take 500 mg by mouth daily.   MELATONIN PO Take 1 capsule by mouth at bedtime.   Multiple Vitamin (MULTIVITAMIN WITH MINERALS) TABS tablet Take 1 tablet by mouth daily.   Oxcarbazepine (TRILEPTAL) 300 MG tablet TAKE 1 TABLET BY MOUTH TWICE A DAY   Potassium 99 MG TABS Take 99 mg by mouth daily.   Probiotic Product (PROBIOTIC DAILY) CAPS Take 1 capsule by mouth daily. Woman   Red Yeast Rice 600 MG CAPS Take 1,200 mg by mouth at bedtime. With Co-Q-10 100 mg   triamcinolone cream (KENALOG) 0.1 % Apply 1 Application topically daily.  TURMERIC PO Take 600 mg by mouth daily.   Omega-3 Fatty Acids (FISH OIL PO) Take 630 mg by mouth daily. (Patient not taking: Reported on 07/09/2022)   omeprazole (PRILOSEC) 40 MG capsule Take 40 mg by mouth daily. (Patient not taking: Reported on 07/09/2022)   valACYclovir (VALTREX) 500 MG tablet TAKE 1 TABLET BY MOUTH DAILY AS NEEDED (COLD SORES). (Patient not taking: Reported on 07/19/2022)   [DISCONTINUED] albuterol (VENTOLIN HFA) 108 (90 Base) MCG/ACT inhaler Inhale 2 puffs into the lungs every 6 (six) hours as needed for wheezing or shortness of breath.   [DISCONTINUED] mupirocin ointment (BACTROBAN) 2 % Apply 1 Application topically as needed.    No facility-administered encounter medications on file as of 07/19/2022.    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/2018   RIGHT FOREARM    Squamous cell carcinoma of skin 09/30/2019   in situ-left lower leg-anterior   Trimalleolar fracture    left   Tubular adenoma of colon 06/27/2018   Past Surgical History:  Procedure Laterality Date   CHOLECYSTECTOMY N/A 12/23/2014   Procedure: LAPAROSCOPIC CHOLECYSTECTOMY WITH INTRAOPERATIVE CHOLANGIOGRAM;  Surgeon: Gaynelle Adu, MD;  Location: [facility] ORS;  Service: General;  Laterality: N/A;   COLONOSCOPY     "3 times trying to get large polyp since August 2020"   COLONOSCOPY WITH PROPOFOL N/A 10/20/2018   Procedure: COLONOSCOPY WITH PROPOFOL;  Surgeon: Lemar Lofty., MD;  Location: [facility] ENDOSCOPY;  Service: Gastroenterology;  Laterality: N/A;   COLONOSCOPY WITH PROPOFOL N/A 11/02/2019   Procedure: COLONOSCOPY WITH PROPOFOL;  Surgeon: Meridee Score Netty Starring., MD;   Location: [facility] ENDOSCOPY;  Service: Gastroenterology;  Laterality: N/A;   COLONOSCOPY WITH PROPOFOL N/A 08/31/2020   Procedure: COLONOSCOPY WITH PROPOFOL;  Surgeon: Meridee Score Netty Starring., MD;  Location: [facility] ENDOSCOPY;  Service: Gastroenterology;  Laterality: N/A;   ENDOSCOPIC MUCOSAL RESECTION N/A 10/20/2018   Procedure: ENDOSCOPIC MUCOSAL RESECTION;  Surgeon: Meridee Score Netty Starring., MD;  Location: [facility] ENDOSCOPY;  Service: Gastroenterology;  Laterality: N/A;   ENDOSCOPIC MUCOSAL RESECTION  11/02/2019   Procedure: ENDOSCOPIC MUCOSAL RESECTION;  Surgeon: Meridee Score Netty Starring., MD;  Location: [facility] ENDOSCOPY;  Service: Gastroenterology;;   HEMOSTASIS CLIP PLACEMENT  10/20/2018   Procedure: HEMOSTASIS CLIP PLACEMENT;  Surgeon: Lemar Lofty., MD;  Location: [facility] ENDOSCOPY;  Service: Gastroenterology;;  HEMOSTASIS CLIP PLACEMENT  11/02/2019   Procedure: HEMOSTASIS CLIP PLACEMENT;  Surgeon: Lemar Lofty., MD;  Location: [facility] ENDOSCOPY;  Service: Gastroenterology;;   ORIF ANKLE FRACTURE Left 02/19/2019   Procedure: OPEN REDUCTION INTERNAL FIXATION (ORIF) LEFT ANKLE FRACTURE;  Surgeon: Terance Hart, MD;  Location: [facility] OR;  Service: Orthopedics;  Laterality: Left;   POLYPECTOMY  10/20/2018   Procedure: POLYPECTOMY;  Surgeon: Mansouraty, Netty Starring., MD;  Location: [facility] ENDOSCOPY;  Service: Gastroenterology;;   POLYPECTOMY  11/02/2019   Procedure: POLYPECTOMY;  Surgeon: Lemar Lofty., MD;  Location: [facility] ENDOSCOPY;  Service: Gastroenterology;;   POLYPECTOMY  08/31/2020   Procedure: POLYPECTOMY;  Surgeon: Lemar Lofty., MD;  Location: [facility] ENDOSCOPY;  Service: Gastroenterology;;   SUBMUCOSAL LIFTING INJECTION  10/20/2018   Procedure: SUBMUCOSAL LIFTING INJECTION;  Surgeon: Lemar Lofty., MD;  Location: [facility] ENDOSCOPY;  Service: Gastroenterology;;   SYNDESMOSIS REPAIR Left 02/19/2019   Procedure: SYNDESMOSIS REPAIR;  Surgeon: Terance Hart, MD;  Location: [facility]  OR;  Service: Orthopedics;  Laterality: Left;   THORACIC DISCECTOMY  07/2007   T12   TUBAL LIGATION     WISDOM TOOTH EXTRACTION     Family History  Problem Relation Age of Onset   Arthritis Father    Alcohol abuse Mother        drinker and smoker   Diabetes Mother    Heart disease Mother    Hypertension Mother    Stroke Mother    Depression Sister    Hearing loss Sister    Hyperlipidemia Sister    Alcohol abuse Daughter    Alcohol abuse Son    Depression Sister    Early death Sister    Alcohol abuse Brother    Depression Sister    Heart disease Sister    Colon cancer Neg Hx    Esophageal cancer Neg Hx    Inflammatory bowel disease Neg Hx    Liver disease Neg Hx    Pancreatic cancer Neg Hx    Rectal cancer Neg Hx    Stomach cancer Neg Hx    Social History   Socioeconomic History   Marital status: Single    Spouse name: Not on file   Number of children: 4   Years of education: 14   Highest education level: Not on file  Occupational History   Occupation: retired    Comment: Midwife payable and nanny   Tobacco Use   Smoking status: Never   Smokeless tobacco: Never  Vaping Use   Vaping status: Never Used  Substance and Sexual Activity   Alcohol use: No    Alcohol/week: 0.0 standard drinks of alcohol    Comment: sober for 47 years-noted 09/16/2018   Drug use: No   Sexual activity: Not Currently  Other Topics Concern   Not on file  Social History Narrative   Patient lives alone.   patient has 4 grandchildren.   Patient is retired   Patient has 2 years of college.   Patient is right handed.         Social Determinants of Health   Financial Resource Strain: Low Risk  (07/19/2022)   Overall Financial Resource Strain (CARDIA)    Difficulty of Paying Living Expenses: Not hard at all  Food Insecurity: No Food Insecurity (07/19/2022)   Hunger Vital Sign    Worried About Running Out of Food in the Last Year: Never true    Ran Out of Food in the Last Year: Never  true  Transportation Needs: No Transportation Needs (07/19/2022)   PRAPARE - Administrator, Civil Service (Medical): No    Lack of Transportation (Non-Medical): No  Physical Activity: Insufficiently Active (07/19/2022)   Exercise Vital Sign    Days of Exercise per Week: 1 day    Minutes of Exercise per Session: 60 min  Stress: No Stress Concern Present (07/19/2022)   [employer] of Occupational Health - Occupational Stress Questionnaire    Feeling of Stress : Not at all  Social Connections: Moderately Integrated (07/19/2022)   Social Connection and Isolation Panel [NHANES]    Frequency of Communication with Friends and Family: More than three times a week    Frequency of Social Gatherings with Friends and Family: More than three times a week    Attends Religious Services: More than 4 times per year    Active Member of [employer] or Organizations: Yes    Attends Banker Meetings: 1 to 4 times per year    Marital Status: Never married    Tobacco Counseling Counseling given: Not Answered   Clinical Intake:  Pre-visit preparation completed: Yes  Pain : No/denies pain     BMI - recorded: 25.42 Nutritional Status: BMI 25 -29 Overweight Nutritional Risks: None Diabetes: No  How often do you need to have someone help you when you read instructions, pamphlets, or other written materials from your doctor or pharmacy?: 1 - Never  Interpreter Needed?: No  Information entered by :: Lanier Ensign, LPN   Activities of Daily Living    07/19/2022   10:09 AM  In your present state of health, do you have any difficulty performing the following activities:  Hearing? 0  Vision? 0  Difficulty concentrating or making decisions? 0  Walking or climbing stairs? 0  Dressing or bathing? 0  Doing errands, shopping? 0  Preparing Food and eating ? N  Using the Toilet? N  In the past six months, have you accidently leaked urine? N  Comment urgency  Do you have  problems with loss of bowel control? N  Managing your Medications? N  Managing your Finances? N  Housekeeping or managing your Housekeeping? N    Patient Care Team: Ardith Dark, MD as PCP - General (Family Medicine) Holli Humbles, MD as Referring Physician (Ophthalmology) Carman Ching, MD (Inactive) as Consulting Physician (Gastroenterology) Glyn Ade, PA-C as Physician Assistant (Dermatology) Van Clines, MD as Consulting Physician (Neurology) Felecia Shelling, DPM as Consulting Physician (Podiatry) Clark-Burning, Victorino Dike, PA-C (Inactive) (Dermatology)  Indicate any recent Medical Services you may have received from other than [facility] providers in the past year (date may be approximate).     Assessment:   This is a routine wellness examination for Deserai.  Hearing/Vision screen Hearing Screening - Comments:: Pt denies any hearing issues  Vision Screening - Comments:: Pt request a referral   Dietary issues and exercise activities discussed:     Goals Addressed             This Visit's Progress    Patient Stated       Get to 135 or below        Depression Screen    07/19/2022   10:15 AM 07/09/2022    2:26 PM 05/08/2022    1:25 PM 04/13/2022   11:51 AM 03/29/2022    2:04 PM 03/20/2022    9:30 AM 03/06/2022    9:33 AM  PHQ 2/9 Scores  PHQ - 2 Score 0 0  0 0 0 0 0    Fall Risk    07/19/2022   10:17 AM 05/08/2022    1:23 PM 04/13/2022   11:51 AM 03/29/2022    2:04 PM 03/20/2022    9:30 AM  Fall Risk   Falls in the past year? 0  0 0 0  Number falls in past yr: 0 0 0 0 0  Injury with Fall? 0 0 0 0 0  Risk for fall due to : Impaired vision No Fall Risks No Fall Risks No Fall Risks No Fall Risks  Follow up Falls prevention discussed        MEDICARE RISK AT HOME:  Medicare Risk at Home - 07/19/22 1017     Any stairs in or around the home? No    If so, are there any without handrails? No    Home free of loose throw rugs in walkways, pet beds,  electrical cords, etc? Yes    Adequate lighting in your home to reduce risk of falls? Yes    Life alert? No    Use of a cane, walker or w/c? No    Grab bars in the bathroom? Yes    Shower chair or bench in shower? No    Elevated toilet seat or a handicapped toilet? No             TIMED UP AND GO:  Was the test performed?  No    Cognitive Function:        07/19/2022   10:18 AM 07/06/2021    9:58 AM 07/01/2020    3:33 PM  6CIT Screen  What Year? 0 points 0 points 0 points  What month? 0 points 0 points 0 points  What time? 0 points 0 points 0 points  Count back from 20 0 points 0 points 0 points  Months in reverse 0 points 0 points 0 points  Repeat phrase 0 points 0 points 0 points  Total Score 0 points 0 points 0 points    Immunizations Immunization History  Administered Date(s) Administered   Influenza-Unspecified 10/22/2017   Moderna Sars-Covid-2 Vaccination 12/16/2019, 01/13/2020   Pneumococcal Conjugate-13 05/16/2016   Pneumococcal Polysaccharide-23 06/25/2013   Tdap 10/22/2017   Zoster Recombinant(Shingrix) 03/24/2017, 07/23/2017    TDAP status: Up to date  Flu Vaccine status: Declined, Education has been provided regarding the importance of this vaccine but patient still declined. Advised may receive this vaccine at local pharmacy or Health Dept. Aware to provide a copy of the vaccination record if obtained from local pharmacy or Health Dept. Verbalized acceptance and understanding.  Pneumococcal vaccine status: Up to date  Covid-19 vaccine status: Completed vaccines  Qualifies for Shingles Vaccine? Yes   Zostavax completed Yes   Shingrix Completed?: Yes  Screening Tests Health Maintenance  Topic Date Due   Medicare Annual Wellness (AWV)  07/19/2023   DTaP/Tdap/Td (2 - Td or Tdap) 10/23/2027   Pneumonia Vaccine 81+ Years old  Completed   Zoster Vaccines- Shingrix  Completed   HPV VACCINES  Aged Out   INFLUENZA VACCINE  Discontinued   DEXA SCAN   Discontinued   COVID-19 Vaccine  Discontinued    Health Maintenance  There are no preventive care reminders to display for this patient.  Colorectal cancer screening: No longer required.   Mammogram status: No longer required due to age.     Additional Screening:  Vision Screening: Recommended annual ophthalmology exams for early detection of glaucoma and other disorders of the eye.  Is the patient up to date with their annual eye exam?  No  Who is the provider or what is the name of the office in which the patient attends annual eye exams? Need referral for provider  If pt is not established with a provider, would they like to be referred to a provider to establish care? Yes .   Dental Screening: Recommended annual dental exams for proper oral hygiene   Community Resource Referral / Chronic Care Management: CRR required this visit?  No   CCM required this visit?  No     Plan:     I have personally reviewed and noted the following in the patient's chart:   Medical and social history Use of alcohol, tobacco or illicit drugs  Current medications and supplements including opioid prescriptions. Patient is not currently taking opioid prescriptions. Functional ability and status Nutritional status Physical activity Advanced directives List of other physicians Hospitalizations, surgeries, and ER visits in previous 12 months Vitals Screenings to include cognitive, depression, and falls Referrals and appointments  In addition, I have reviewed and discussed with patient certain preventive protocols, quality metrics, and best practice recommendations. A written personalized care plan for preventive services as well as general preventive health recommendations were provided to patient.     Marzella Schlein, LPN   1/[id number]/[id number]   After Visit Summary: (MyChart) Due to this being a telephonic visit, the after visit summary with patients personalized plan was offered to patient via  MyChart   Nurse Notes: none

## 2022-07-20 ENCOUNTER — Encounter: Payer: Self-pay | Admitting: Family Medicine

## 2022-07-20 ENCOUNTER — Ambulatory Visit (INDEPENDENT_AMBULATORY_CARE_PROVIDER_SITE_OTHER): Payer: Medicare Other | Admitting: Family Medicine

## 2022-07-20 VITALS — BP 169/77 | HR 73 | Temp 98.2°F | Ht 62.0 in | Wt 139.4 lb

## 2022-07-20 DIAGNOSIS — L219 Seborrheic dermatitis, unspecified: Secondary | ICD-10-CM | POA: Diagnosis not present

## 2022-07-20 DIAGNOSIS — I10 Essential (primary) hypertension: Secondary | ICD-10-CM | POA: Diagnosis not present

## 2022-07-20 DIAGNOSIS — L0390 Cellulitis, unspecified: Secondary | ICD-10-CM

## 2022-07-20 MED ORDER — SULFAMETHOXAZOLE-TRIMETHOPRIM 800-160 MG PO TABS: 1.0000 | ORAL_TABLET | Freq: Two times a day (BID) | ORAL | 0 refills | Status: DC

## 2022-07-20 NOTE — Progress Notes (Signed)
   Joan Mclaughlin is a 82 y.o. female who presents today for an office visit.  Assessment/Plan:  New/Acute Problems: Cellulitis  No signs of systemic infection.  Her cellulitis did not improve with Keflex.  She has an Augmentin and doxycycline allergy.  We will switch to Bactrim double strength tablet twice daily for the next 7 days.  She will continue wound dressings at home.  She will follow-up with [place] in 1 to 2 weeks if not improving.  We discussed reasons to return to care or seek emergent care  Chronic Problems Addressed Today: Seborrheic dermatitis Currently on ketoconazole shampoo.  Previous dermatologist retired.  Needs new referral.  Will place this today.  Essential hypertension Elevated to 166/75 today.  She is typically well-controlled at her last several office visits.  She will monitor at home and let us know if persistently elevated.  If patient is not able to monitor at home she can come back here next week for recheck as well.  Continue amlodipine 5 mg daily.     Subjective:  HPI:  Patient here for laceration follow-up.  We saw her for this 11 days ago.  At that time we started Keflex due to concern for cellulitis.  We also repaired her skin tear with Steri-Strips.  She still has some redness around the area.  She has been consistent with Keflex.  No fevers or chills.  She is still worried about lingering infection.  Some pain around the area she is worried about infection as well.  Small amount of drainage still present.       Objective:  Physical Exam: BP (!) 169/77   Pulse 73   Temp 98.2 F (36.8 C) (Temporal)   Ht 5\' 2"  (1.575 m)   Wt 139 lb 6.4 oz (63.2 kg)   SpO2 97%   BMI 25.50 kg/m   Gen: No acute distress, resting comfortably CV: Regular rate and rhythm with no murmurs appreciated Pulm: Normal work of breathing, clear to auscultation bilaterally with no crackles, wheezes, or rhonchi Skin: 2 skin tears on right lower extremity with Steri-Strips in  place.  Surrounding erythema and tenderness palpation along lateralmost skin tear.  Minimal drainage present. Neuro: Grossly normal, moves all extremities Psych: Normal affect and thought content      Holmes Hays M. [name], MD 07/20/2022 2:50 PM

## 2022-07-20 NOTE — Assessment & Plan Note (Addendum)
Elevated to 166/75 today.  She is typically well-controlled at her last several office visits.  She will monitor at home and let us know if persistently elevated.  If patient is not able to monitor at home she can come back here next week for recheck as well.  Continue amlodipine 5 mg daily.

## 2022-07-20 NOTE — Patient Instructions (Signed)
It was very nice to see you today!  Please start the Bactrim.  This will treat bacteria that are not treated with the Keflex.  I will refer you to see the dermatologist.  Please keep an eye on your blood pressure and let us know if it is persistently elevated.  Return if symptoms worsen or fail to improve.   Take care, Dr [name]  PLEASE NOTE:  If you had any lab tests, please let us know if you have not heard back within a few days. You may see your results on mychart before we have a chance to review them but we will give you a call once they are reviewed by [place].   If we ordered any referrals today, please let us know if you have not heard from their office within the next week.   If you had any urgent prescriptions sent in today, please check with the pharmacy within an hour of our visit to make sure the prescription was transmitted appropriately.   Please try these tips to maintain a healthy lifestyle:  Eat at least 3 REAL meals and 1-2 snacks per day.  Aim for no more than 5 hours between eating.  If you eat breakfast, please do so within one hour of getting up.   Each meal should contain half fruits/vegetables, one quarter protein, and one quarter carbs (no bigger than a computer mouse)  Cut down on sweet beverages. This includes juice, soda, and sweet tea.   Drink at least 1 glass of water with each meal and aim for at least 8 glasses per day  Exercise at least 150 minutes every week.

## 2022-07-20 NOTE — Assessment & Plan Note (Signed)
Currently on ketoconazole shampoo.  Previous dermatologist retired.  Needs new referral.  Will place this today.

## 2022-07-23 ENCOUNTER — Telehealth: Payer: Self-pay | Admitting: Family Medicine

## 2022-07-23 ENCOUNTER — Ambulatory Visit (INDEPENDENT_AMBULATORY_CARE_PROVIDER_SITE_OTHER): Payer: Medicare Other | Admitting: Physician Assistant

## 2022-07-23 ENCOUNTER — Encounter: Payer: Self-pay | Admitting: Physician Assistant

## 2022-07-23 VITALS — BP 130/80 | HR 80 | Temp 97.8°F | Ht 62.0 in | Wt 140.0 lb

## 2022-07-23 DIAGNOSIS — L0390 Cellulitis, unspecified: Secondary | ICD-10-CM | POA: Diagnosis not present

## 2022-07-23 DIAGNOSIS — R110 Nausea: Secondary | ICD-10-CM | POA: Diagnosis not present

## 2022-07-23 LAB — CBC
HCT: 38.1 % (ref 36.0–46.0)
Hemoglobin: 13 g/dL (ref 12.0–15.0)
MCHC: 34.1 g/dL (ref 30.0–36.0)
MCV: 94.8 fl (ref 78.0–100.0)
Platelets: 225 10*3/uL (ref 150.0–400.0)
RBC: 4.01 Mil/uL (ref 3.87–5.11)
RDW: 13.4 % (ref 11.5–15.5)
WBC: 6.6 10*3/uL (ref 4.0–10.5)

## 2022-07-23 NOTE — Telephone Encounter (Signed)
Patient is scheduled with Jarold Motto this morning for nausea and fever.   I have called patient for triage.  Patient disconnected through hold time.   [facility] is going to call patient back for triage.  I have notified the patient.

## 2022-07-23 NOTE — Telephone Encounter (Signed)
Spoke to pt asked her how she was feeling, are you feeling worse since you saw Dr. [name]? Pt said she just does not feel good. Asked her what her fever is? Pt said she does not have a fever. Asked her if she has taken her temp? Pt said no, but I know I do not have a fever. Asked pt about her nausea. Pt said she just does not feel like eating. Denies diarrhea. Asked her how her leg was? Pt said the pain has stopped. Asked her if taking antibiotic? Pt said yes, twice a day. Told her okay, we will see you later at your appt. Pt verbalized understanding.

## 2022-07-23 NOTE — Telephone Encounter (Signed)
 Noted  

## 2022-07-23 NOTE — Patient Instructions (Addendum)
It was great to see you!  I think your wound is looking good -- continue excellent wound care Reach out after completing antibiotic if you are still having symptom(s) or any new/worsening symptom(s) in the meantime  Take care,  Jarold Motto PA-C

## 2022-07-23 NOTE — Progress Notes (Signed)
Joan Mclaughlin is a 82 y.o. female here for a follow up of a pre-existing problem.  History of Present Illness:   Chief Complaint  Patient presents with   Cellulitis    Pt is currently on Bactrium 800-160 mg BID for cellulitis of her right lower leg, area is still red and weeping.  Pt said she is not feeling well.    Chief Complaint  Patient presents with   Cellulitis    Pt is currently on Bactrium 800-160 mg BID for cellulitis of her right lower leg, area is still red and weeping.  Pt said she is not feeling well.      HPI  Fever; Nausea: Complains of fever, chills, nausea, and one episode of emesis that occurred Friday night after picking up dinner of fish and vegetables at [employer]. She is still nauseous, but if unsure if it Bactrim is contributing.  Notes that Saturday she was only able to eat 2 pieces of toast and couldn't drink coffee; Sunday she only ate small portions of bread products, but could drink coffee.  Denies diarrhea, cough, or other upper respiratory symptoms.   Cellulitis: She was seen by Jacquiline Doe, MD 7/12 for cellulitis. Did not improve with Keflex, allergic to Augmentin and Doxycycline. Was prescribed 160 mg Bactrim twice daily for 7 days and recommended to follow-up if no improvement within 1-2 weeks.  Reports cleaning wound every morning and night before dressing with sterile cotton tape and applying Bacitracin as recommended. Reports serosanguineous drainage on most recent change. States she has been elevating her affected leg at home and before falling asleep. Notes the wound is not tender on palpation, but still uncomfortable, and redness remains unchanged.   Past Medical History:  Diagnosis Date   Alcoholism (HCC)    sober for 47 years-noted 09/16/2018   Allergy    Anxiety    Cancer (HCC)    skin cancer   Cholecystitis    Complication of anesthesia    Difficulty sleeping    takes med to sleep   Diverticulitis    Family history of  adverse reaction to anesthesia    SIster - N/V   Gallstones    Ganglion cyst 12/11/2016   GERD (gastroesophageal reflux disease)    "RELATED TO GALLBLADDER"   Heart murmur    "ONLY DETECTED WHEN LYING DOWN"  - 10/16/2018- Not concerned with   History of Lyme disease 09/19/2016   Hyperlipidemia    Hypertension    Lyme disease    Pt states she has had Lyme Disease twice.    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/2018   RIGHT FOREARM    Squamous cell carcinoma of skin 09/30/2019   in situ-left lower leg-anterior   Trimalleolar fracture    left   Tubular adenoma of colon 06/27/2018     Social History   Tobacco Use   Smoking status: Never   Smokeless tobacco: Never  Vaping Use   Vaping status: Never Used  Substance Use Topics   Alcohol use: No    Alcohol/week: 0.0 standard drinks of alcohol    Comment: sober for 47 years-noted 09/16/2018   Drug use: No    Past Surgical  History:  Procedure Laterality Date   CHOLECYSTECTOMY N/A 12/23/2014   Procedure: LAPAROSCOPIC CHOLECYSTECTOMY WITH INTRAOPERATIVE CHOLANGIOGRAM;  Surgeon: Gaynelle Adu, MD;  Location: [facility] ORS;  Service: General;  Laterality: N/A;   COLONOSCOPY     "3 times trying to get large polyp since August 2020"   COLONOSCOPY WITH PROPOFOL N/A 10/20/2018   Procedure: COLONOSCOPY WITH PROPOFOL;  Surgeon: Lemar Lofty., MD;  Location: [facility] ENDOSCOPY;  Service: Gastroenterology;  Laterality: N/A;   COLONOSCOPY WITH PROPOFOL N/A 11/02/2019   Procedure: COLONOSCOPY WITH PROPOFOL;  Surgeon: Meridee Score Netty Starring., MD;  Location: [facility] ENDOSCOPY;  Service: Gastroenterology;  Laterality: N/A;   COLONOSCOPY WITH PROPOFOL N/A 08/31/2020   Procedure: COLONOSCOPY WITH PROPOFOL;  Surgeon: Meridee Score  Netty Starring., MD;  Location: [facility] ENDOSCOPY;  Service: Gastroenterology;  Laterality: N/A;   ENDOSCOPIC MUCOSAL RESECTION N/A 10/20/2018   Procedure: ENDOSCOPIC MUCOSAL RESECTION;  Surgeon: Meridee Score Netty Starring., MD;  Location: [facility] ENDOSCOPY;  Service: Gastroenterology;  Laterality: N/A;   ENDOSCOPIC MUCOSAL RESECTION  11/02/2019   Procedure: ENDOSCOPIC MUCOSAL RESECTION;  Surgeon: Meridee Score Netty Starring., MD;  Location: [facility] ENDOSCOPY;  Service: Gastroenterology;;   HEMOSTASIS CLIP PLACEMENT  10/20/2018   Procedure: HEMOSTASIS CLIP PLACEMENT;  Surgeon: Lemar Lofty., MD;  Location: [facility] ENDOSCOPY;  Service: Gastroenterology;;   HEMOSTASIS CLIP PLACEMENT  11/02/2019   Procedure: HEMOSTASIS CLIP PLACEMENT;  Surgeon: Lemar Lofty., MD;  Location: [facility] ENDOSCOPY;  Service: Gastroenterology;;   ORIF ANKLE FRACTURE Left 02/19/2019   Procedure: OPEN REDUCTION INTERNAL FIXATION (ORIF) LEFT ANKLE FRACTURE;  Surgeon: Terance Hart, MD;  Location: [facility] OR;  Service: Orthopedics;  Laterality: Left;   POLYPECTOMY  10/20/2018   Procedure: POLYPECTOMY;  Surgeon: Mansouraty, Netty Starring., MD;  Location: [facility] ENDOSCOPY;  Service: Gastroenterology;;   POLYPECTOMY  11/02/2019   Procedure: POLYPECTOMY;  Surgeon: Lemar Lofty., MD;  Location: [facility] ENDOSCOPY;  Service: Gastroenterology;;   POLYPECTOMY  08/31/2020   Procedure: POLYPECTOMY;  Surgeon: Lemar Lofty., MD;  Location: [facility] ENDOSCOPY;  Service: Gastroenterology;;   SUBMUCOSAL LIFTING INJECTION  10/20/2018   Procedure: SUBMUCOSAL LIFTING INJECTION;  Surgeon: Lemar Lofty., MD;  Location: [facility] ENDOSCOPY;  Service: Gastroenterology;;   SYNDESMOSIS REPAIR Left 02/19/2019   Procedure: SYNDESMOSIS REPAIR;  Surgeon: Terance Hart, MD;  Location: [facility] OR;  Service: Orthopedics;  Laterality: Left;   THORACIC DISCECTOMY  07/2007   T12   TUBAL LIGATION     WISDOM TOOTH EXTRACTION      Family History  Problem Relation Age  of Onset   Arthritis Father    Alcohol abuse Mother        drinker and smoker   Diabetes Mother    Heart disease Mother    Hypertension Mother    Stroke Mother    Depression Sister    Hearing loss Sister    Hyperlipidemia Sister    Alcohol abuse Daughter    Alcohol abuse Son    Depression Sister    Early death Sister    Alcohol abuse Brother    Depression Sister    Heart disease Sister    Colon cancer Neg Hx    Esophageal cancer Neg Hx    Inflammatory bowel disease Neg Hx    Liver disease Neg Hx    Pancreatic cancer Neg Hx    Rectal cancer Neg Hx    Stomach cancer Neg Hx     Allergies  Allergen Reactions   Amoxicillin-Pot Clavulanate Nausea And Vomiting    Patient denies knowledge of  this allergy  Did it involve swelling of the face/tongue/throat, SOB, or low BP? No Did it involve sudden or severe rash/hives, skin peeling, or any reaction on the inside of your mouth or nose? No Did you need to seek medical attention at a hospital or doctor's office? No When did it last happen?      in 2016 If all above answers are "NO", may proceed with cephalosporin use.    Doxycycline Hives   Suvorexant Other (See Comments)    Nightmares     Current Medications:   Current Outpatient Medications:    amLODipine (NORVASC) 5 MG tablet, Take 1 tablet (5 mg total) by mouth daily., Disp: 90 tablet, Rfl: 1   Ascorbic Acid (VITAMIN C) 1000 MG tablet, Take 1,000 mg by mouth daily., Disp: , Rfl:    b complex vitamins capsule, Take 1 capsule by mouth daily., Disp: , Rfl:    CALCIUM-MAGNESIUM PO, Take 1 tablet by mouth daily. 1000 mg / 500 mg, Disp: , Rfl:    Cholecalciferol (VITAMIN D3) 50 MCG (2000 UT) capsule, Take 2,000 Units by mouth daily., Disp: , Rfl:    hydrocortisone 2.5 % cream, Apply topically 2 (two) times daily as needed (Rash). (Patient taking differently: Apply 1 application  topically 2 (two) times daily as needed (Rosacea).), Disp: 30 g, Rfl: 11   hydrOXYzine (ATARAX) 10 MG  tablet, TAKE 1 TO 2 TABLETS (10-20 MG TOTAL) BY MOUTH AT BEDTIME, Disp: 180 tablet, Rfl: 2   ketoconazole (NIZORAL) 2 % shampoo, Use 2-3 times weekly as needed, Disp: 120 mL, Rfl: 5   Lysine 500 MG TABS, Take 500 mg by mouth daily., Disp: , Rfl:    MELATONIN PO, Take 1 capsule by mouth at bedtime., Disp: , Rfl:    Multiple Vitamin (MULTIVITAMIN WITH MINERALS) TABS tablet, Take 1 tablet by mouth daily., Disp: , Rfl:    Omega-3 Fatty Acids (FISH OIL PO), Take 630 mg by mouth daily., Disp: , Rfl:    omeprazole (PRILOSEC) 40 MG capsule, Take 40 mg by mouth daily., Disp: , Rfl:    Oxcarbazepine (TRILEPTAL) 300 MG tablet, TAKE 1 TABLET BY MOUTH TWICE A DAY, Disp: 180 tablet, Rfl: 0   Potassium 99 MG TABS, Take 99 mg by mouth daily., Disp: , Rfl:    Probiotic Product (PROBIOTIC DAILY) CAPS, Take 1 capsule by mouth daily. Woman, Disp: , Rfl:    Red Yeast Rice 600 MG CAPS, Take 1,200 mg by mouth at bedtime. With Co-Q-10 100 mg, Disp: , Rfl:    sulfamethoxazole-trimethoprim (BACTRIM DS) 800-160 MG tablet, Take 1 tablet by mouth 2 (two) times daily for 7 days., Disp: 14 tablet, Rfl: 0   triamcinolone cream (KENALOG) 0.1 %, Apply 1 Application topically daily., Disp: 30 g, Rfl: 5   TURMERIC PO, Take 600 mg by mouth daily., Disp: , Rfl:    valACYclovir (VALTREX) 500 MG tablet, TAKE 1 TABLET BY MOUTH DAILY AS NEEDED (COLD SORES)., Disp: 90 tablet, Rfl: 1   Review of Systems:   Review of Systems  Gastrointestinal:  Positive for nausea (--see HPI).  Skin:        (+) Laceration (LRE)      Vitals:   Vitals:   07/23/22 1116  BP: 130/80  Pulse: 80  Temp: 97.8 F (36.6 C)  TempSrc: Temporal  SpO2: 96%  Weight: 140 lb (63.5 kg)  Height: 5\' 2"  (1.575 m)     Body mass index is 25.61 kg/m.  Physical Exam:  Physical Exam Vitals and nursing note reviewed.  Constitutional:      General: She is not in acute distress.    Appearance: Normal appearance. She is well-developed. She is not ill-appearing  or toxic-appearing.  Eyes:     General: Lids are normal.     Extraocular Movements: Extraocular movements intact.     Conjunctiva/sclera: Conjunctivae normal.     Pupils: Pupils are equal, round, and reactive to light.  Neck:     Trachea: Trachea normal.  Cardiovascular:     Rate and Rhythm: Normal rate and regular rhythm.     Heart sounds: Normal heart sounds, S1 normal and S2 normal.     Comments: No LE swelling Pulmonary:     Effort: Pulmonary effort is normal. No tachypnea or respiratory distress.     Breath sounds: Normal breath sounds. No decreased breath sounds, wheezing, rhonchi or rales.  Musculoskeletal:        General: Normal range of motion.     Cervical back: Full passive range of motion without pain, normal range of motion and neck supple.     Comments: No calf tenderness  Lymphadenopathy:     Cervical: No cervical adenopathy.  Skin:    General: Skin is warm and dry.     Comments: Well-healing skin tear to anterior left shin without significant swelling, warmth, tenderness  Mild erythema to inferior aspect of skin tear   Neurological:     Mental Status: She is alert and oriented to person, place, and time.     GCS: GCS eye subscore is 4. GCS verbal subscore is 5. GCS motor subscore is 6.     Cranial Nerves: No cranial nerve deficit.     Sensory: No sensory deficit.     Deep Tendon Reflexes: Reflexes are normal and symmetric.  Psychiatric:        Attention and Perception: Attention and perception normal.        Mood and Affect: Mood normal.        Speech: Speech normal.        Behavior: Behavior normal. Behavior is cooperative.        Thought Content: Thought content normal.        Judgment: Judgment normal.      Assessment and Plan:   Cellulitis, unspecified cellulitis site No red flags Symptoms are overall stable, if not improving I encouraged her to continue her antibiotic(s) as prescribed, continue to elevated legs and also continue to dress wound  regularly as she is already doing so If new/worsening symptom(s), recommend close follow-up She is quite concerned about worsening infection, we can check CBC today and if any major concerns, consider change in therapy however she is only 3 days into her course and encouraged her to see this through VITALS SIGNS STABLE and she is in no acute distress today  Nausea No red flags Suspect related to bactrim Recommend continued bland meals, fluids and taking antibiotic(s) with food May consider Zofran if persists    I,Emily Lagle,acting as a Neurosurgeon for [employer], PA.,have documented all relevant documentation on the behalf of Jarold Motto, PA,as directed by  Jarold Motto, PA while in the presence of Jarold Motto, [place].  I, Jarold Motto, [place], have reviewed all documentation for this visit. The documentation on 07/23/22 for the exam, diagnosis, procedures, and orders are all accurate and complete.  Jarold Motto, PA-C

## 2022-07-23 NOTE — Telephone Encounter (Signed)
Patient Name First: Joan Last: [place] Mclaughlin Gender: Female DOB: 29-Jun-1940 Age: 82 Y 11 M 27 D Return Phone Number: [phone] (Primary) Address: City/ State/ Zip: [place] [facility] Provider Jacquiline Doe- MD Contact Type Call Who Is Calling Patient / Member / Family / Caregiver Call Type Triage / Clinical Relationship To Patient Self Return Phone Number [phone] (Primary) Chief Complaint Fever (non-urgent symptom) (greater than THREE MONTHS old) Reason for Call Symptomatic / Request for Health Information Initial Comment Caller states she is with the office and she was trying to transfer a patient to be triaged. The patient hung up. She said the patient has an appointment for a cut on her shin. She have a fever and is nausea so the office want her to speak to triage first Translation No Nurse Assessment Nurse: Stefano Gaul, RN, Dwana Curd Date/Time (Eastern Time): 07/23/2022 8:54:51 AM Confirm and document reason for call. If symptomatic, describe symptoms. ---Caller states she has appt for a cut today at 11:20 am. Has a cut on her shin. has been taking bactrim BID since Friday for an infection. Has been nauseated since Friday. had chills on Friday but not today. Does the patient have any new or worsening symptoms? ---Yes Will a triage be completed? ---Yes Related visit to physician within the last 2 weeks? ---Yes Does the PT have any chronic conditions? (i.e. diabetes, asthma, this includes High risk factors for pregnancy, etc.) ---Yes List chronic conditions. ---HTN Is this a behavioral health or substance abuse call? ---No Guidelines Guideline Title Affirmed Question Affirmed Notes Nurse Date/Time Lamount Cohen Time) Cellulitis on Antibiotic Follow-up Call [1] Taking antibiotic > 24 hours AND [2] cellulitis symptoms Stefano Gaul, RN, Dwana Curd 07/23/2022  8:58:45 AM   Guidelines Guideline Title Affirmed Question Affirmed Notes Nurse Date/Time (Eastern Time) are WORSE (e.g., spreading redness, pain, swelling) Disp. Time Lamount Cohen Time) Disposition Final User 07/23/2022 9:03:33 AM See PCP within 24 Hours Yes Stefano Gaul, RN, Dwana Curd Final Disposition 07/23/2022 9:03:33 AM See PCP within 24 Hours Yes Stefano Gaul, RN, Clerance Lav Disagree/Comply Comply Caller Understands Yes PreDisposition Home Care Care Advice Given Per Guideline SEE PCP WITHIN 24 HOURS: * IF OFFICE WILL BE OPEN: You need to be examined within the next 24 hours. Call your doctor (or NP/PA) when the office opens and make an appointment. CALL BACK IF: * Fever over 100 F (37.8 C) * You become worse CARE ADVICE given per Cellulitis on Antibiotic Follow-Up Call (Adult) guideline. Referrals REFERRED TO PCP OFFICE

## 2022-07-25 ENCOUNTER — Other Ambulatory Visit: Payer: Self-pay | Admitting: *Deleted

## 2022-07-25 MED ORDER — HYDROXYZINE HCL 10 MG PO TABS: ORAL_TABLET | ORAL | 2 refills | Status: DC

## 2022-07-27 ENCOUNTER — Ambulatory Visit (INDEPENDENT_AMBULATORY_CARE_PROVIDER_SITE_OTHER): Payer: Medicare Other | Admitting: Neurology

## 2022-07-27 ENCOUNTER — Encounter: Payer: Self-pay | Admitting: Neurology

## 2022-07-27 VITALS — BP 137/68 | HR 78 | Ht 62.0 in | Wt 138.0 lb

## 2022-07-27 DIAGNOSIS — G40009 Localization-related (focal) (partial) idiopathic epilepsy and epileptic syndromes with seizures of localized onset, not intractable, without status epilepticus: Secondary | ICD-10-CM | POA: Diagnosis not present

## 2022-07-27 MED ORDER — OXCARBAZEPINE 300 MG PO TABS
ORAL_TABLET | ORAL | 3 refills | Status: DC
Start: 2022-07-27 — End: 2023-10-29

## 2022-07-27 NOTE — Progress Notes (Signed)
NEUROLOGY FOLLOW UP OFFICE NOTE  Joan Mclaughlin [id number] May 14, 1984  HISTORY OF PRESENT ILLNESS: I had the pleasure of seeing Joan Mclaughlin in follow-up in the neurology clinic on 07/27/2022.  The patient was last seen almost 2 years ago for seizures.  She is alone in the office today. Records and images were personally reviewed where available.  Since her last visit, she continues to do well seizure-free since July 2013 on Oxcarbazepine 300mg  BID without side effects. No staring/unresponsive episodes, gaps in time, olfactory/gustatory hallucinations, focal numbness/tingling/weakness, myoclonic jerks. No headaches, dizziness, vision changes, no falls. She has been speaking to her therapist regarding sleep issues, she wanted to get off Xanax 1/2 tablet, and has been taking Lavander, L-threonine, Magnesium glycinate, and melatonin. She wakes up 1-2 times a night and eventually goes back to sleep. Mood is good.   History on Initial Assessment 05/01/2013: This is a 82 yo RH woman with a history of seizures diagnosed in 2008. She was in [place] and was sleep deprived, started feeling strange, "like looking from the outside in," then fell down with a generalized convulsion. She was living in [place] and had seen a neurologist, started on Trileptal, which she had been taking for several years until she self-reduced dose by half. She had been taking this lower dose for 2 years with no seizures until July 2013. At that time, she had been taking lorazepam 1mg  qhs for several years and did not take it the night prior, instead took a different over the counter sleep aid. She woke up with blood on her face and bowel incontinence, then apparently had another seizure that was witnessed by her son with foaming at the mouth. She was started on Keppra but felt weird on this and was put back on Trileptal 300mg  BID since then, with no further seizures.   She feels she may have had seizures as a teenager, she  recalls babysitting then falling back and losing consciousness. She "always fainted" in church. In college, she was drinking the night before and recalls looking in the mirror, then found herself on the floor. She denies any gaps in time, no staring/unresponsive episodes, olfactory/gustatory hallucinations, focal numbness/tingling/weakness, myoclonic jerks. She has never been a good sleeper, and has tried all the over the counter medications with no effect. She has tried Ambien (felt hungover), Lunesta and Melatonin gave her weird nightmarish thoughts. She has been taking lorazepam nightly for at least 3 or 4 years, and this is the only medication that helps her without feeling hungover the next day.   Epilepsy Risk Factors: Her brother has seizures. Another brother may have seizures also, he takes phenobarbital. Her nephew may have seizures as well. Otherwise she had a normal birth and early development. There is no history of febrile convulsions, CNS infections such as meningitis/encephalitis, significant traumatic brain injury, neurosurgical procedures.  Diagnostic Data: Routine EEG by Dr. Henrene Hawking 07/2005: Left posterior frontal sharp wave activity with phase reversal at F7. Left frontal sharp and slow activity. Abnormal EEG with posterior frontal low activity compatible with seizure disorder with left frontal focus.   Routine EEG by Dr. Henrene Hawking 06/2007: Spike activity with phase reversal noted at [facility]. Hyperventilation denoted spike activity. Impression: Left frontal region irritative lesion compatible with underlying seizures.   Routine EEG by Dr. Henrene Hawking 05/2009: Left temporal spike activity noted with phase reversal T3. Impression: Abnormal due to irritative lesion left temporal region compatible with underlying seizure disorder.   MRI brain 04/2006: There are scattered  punctate foci of signal abnormality in the cerebral white matter bilaterally. These findings are consistent with mild microvascular  ischemia.   Per Dr. Westley Hummer note: "Reviewed MRI head, MRI brain from March 28, 2006 demonstrates atrophy in the medial temporal lobes, more prominent on the right." She had a seizure in July 2013 felt to be provoked by diphenhydramine and low dose Trileptal   PAST MEDICAL HISTORY: Past Medical History:  Diagnosis Date   Alcoholism (HCC)    sober for 47 years-noted 09/16/2018   Allergy    Anxiety    Cancer (HCC)    skin cancer   Cholecystitis    Complication of anesthesia    Difficulty sleeping    takes med to sleep   Diverticulitis    Family history of adverse reaction to anesthesia    SIster - N/V   Gallstones    Ganglion cyst 12/11/2016   GERD (gastroesophageal reflux disease)    "RELATED TO GALLBLADDER"   Heart murmur    "ONLY DETECTED WHEN LYING DOWN"  - 10/16/2018- Not concerned with   History of Lyme disease 09/19/2016   Hyperlipidemia    Hypertension    Lyme disease    Pt states she has had Lyme Disease twice.    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/2018   RIGHT FOREARM    Squamous cell carcinoma of skin 09/30/2019   in situ-left lower leg-anterior   Trimalleolar fracture    left   Tubular adenoma of colon 06/27/2018    MEDICATIONS: Current Outpatient Medications on File Prior to Visit  Medication Sig Dispense Refill   amLODipine (NORVASC) 5 MG tablet Take 1 tablet (5 mg total) by mouth daily. 90 tablet 1   Ascorbic Acid (VITAMIN C) 1000 MG tablet Take 1,000 mg by mouth daily.     b complex vitamins capsule Take 1 capsule by mouth daily.     CALCIUM-MAGNESIUM PO Take 1 tablet by mouth daily. 1000 mg / 500 mg     Cholecalciferol (VITAMIN D3) 50 MCG (2000 UT) capsule Take 2,000 Units by mouth daily.     hydrocortisone 2.5 % cream Apply  topically 2 (two) times daily as needed (Rash). (Patient taking differently: Apply 1 application  topically 2 (two) times daily as needed (Rosacea).) 30 g 11   hydrOXYzine (ATARAX) 10 MG tablet TAKE 1 TO 2 TABLETS (10-20 MG TOTAL) BY MOUTH AT BEDTIME 180 tablet 2   ketoconazole (NIZORAL) 2 % shampoo Use 2-3 times weekly as needed 120 mL 5   Lysine 500 MG TABS Take 500 mg by mouth daily.     MELATONIN PO Take 1 capsule by mouth at bedtime.     Multiple Vitamin (MULTIVITAMIN WITH MINERALS) TABS tablet Take 1 tablet by mouth daily.     Omega-3 Fatty Acids (FISH OIL PO) Take 630 mg by mouth daily.     omeprazole (PRILOSEC) 40 MG capsule Take 40 mg by mouth daily.     Oxcarbazepine (TRILEPTAL) 300 MG tablet TAKE 1 TABLET BY MOUTH TWICE A DAY 180 tablet 0   Potassium 99 MG TABS Take 99 mg by mouth daily.     Probiotic Product (PROBIOTIC DAILY) CAPS Take 1 capsule by mouth  daily. Woman     Red Yeast Rice 600 MG CAPS Take 1,200 mg by mouth at bedtime. With Co-Q-10 100 mg     triamcinolone cream (KENALOG) 0.1 % Apply 1 Application topically daily. 30 g 5   TURMERIC PO Take 600 mg by mouth daily.     valACYclovir (VALTREX) 500 MG tablet TAKE 1 TABLET BY MOUTH DAILY AS NEEDED (COLD SORES). 90 tablet 1   No current facility-administered medications on file prior to visit.    ALLERGIES: Allergies  Allergen Reactions   Amoxicillin-Pot Clavulanate Nausea And Vomiting    Patient denies knowledge of this allergy  Did it involve swelling of the face/tongue/throat, SOB, or low BP? No Did it involve sudden or severe rash/hives, skin peeling, or any reaction on the inside of your mouth or nose? No Did you need to seek medical attention at a hospital or doctor's office? No When did it last happen?      in 2016 If all above answers are "NO", may proceed with cephalosporin use.    Doxycycline Hives   Suvorexant Other (See Comments)    Nightmares     FAMILY HISTORY: Family History  Problem Relation Age  of Onset   Arthritis Father    Alcohol abuse Mother        drinker and smoker   Diabetes Mother    Heart disease Mother    Hypertension Mother    Stroke Mother    Depression Sister    Hearing loss Sister    Hyperlipidemia Sister    Alcohol abuse Daughter    Alcohol abuse Son    Depression Sister    Early death Sister    Alcohol abuse Brother    Depression Sister    Heart disease Sister    Colon cancer Neg Hx    Esophageal cancer Neg Hx    Inflammatory bowel disease Neg Hx    Liver disease Neg Hx    Pancreatic cancer Neg Hx    Rectal cancer Neg Hx    Stomach cancer Neg Hx     SOCIAL HISTORY: Social History   Socioeconomic History   Marital status: Single    Spouse name: Not on file   Number of children: 4   Years of education: 14   Highest education level: Not on file  Occupational History   Occupation: retired    Comment: Midwife payable and nanny   Tobacco Use   Smoking status: Never   Smokeless tobacco: Never  Vaping Use   Vaping status: Never Used  Substance and Sexual Activity   Alcohol use: No    Alcohol/week: 0.0 standard drinks of alcohol    Comment: sober for 47 years-noted 09/16/2018   Drug use: No   Sexual activity: Not Currently  Other Topics Concern   Not on file  Social History Narrative   Patient lives alone.   patient has 4 grandchildren.   Patient is retired   Patient has 2 years of college.   Patient is right handed.         Social Determinants of Health   Financial Resource Strain: Low Risk  (07/19/2022)   Overall Financial Resource Strain (CARDIA)    Difficulty of Paying Living Expenses: Not hard at all  Food Insecurity: No Food Insecurity (07/19/2022)   Hunger Vital Sign    Worried About Running Out of Food in the Last Year: Never true    Ran Out of Food in the Last Year: Never true  Transportation Needs: No Transportation Needs (07/19/2022)   PRAPARE - Administrator, Civil Service (Medical): No    Lack of  Transportation (Non-Medical): No  Physical Activity: Insufficiently Active (07/19/2022)   Exercise Vital Sign    Days of Exercise per Week: 1 day    Minutes of Exercise per Session: 60 min  Stress: No Stress Concern Present (07/19/2022)   [employer] of Occupational Health - Occupational Stress Questionnaire    Feeling of Stress : Not at all  Social Connections: Moderately Integrated (07/19/2022)   Social Connection and Isolation Panel [NHANES]    Frequency of Communication with Friends and Family: More than three times a week    Frequency of Social Gatherings with Friends and Family: More than three times a week    Attends Religious Services: More than 4 times per year    Active Member of [employer] or Organizations: Yes    Attends Banker Meetings: 1 to 4 times per year    Marital Status: Never married  Intimate Partner Violence: Not At Risk (07/19/2022)   Humiliation, Afraid, Rape, and Kick questionnaire    Fear of Current or Ex-Partner: No    Emotionally Abused: No    Physically Abused: No    Sexually Abused: No     PHYSICAL EXAM: Vitals:   07/27/22 1003  BP: 137/68  Pulse: 78  SpO2: 97%   General: No acute distress Head:  Normocephalic/atraumatic Skin/Extremities: No rash, no edema Neurological Exam: alert and awake. No aphasia or dysarthria. Fund of knowledge is appropriate.   Attention and concentration are normal.   Cranial nerves: Pupils equal, round. Extraocular movements intact with no nystagmus. Visual fields full.  No facial asymmetry.  Motor: Bulk and tone normal, muscle strength 5/5 throughout with no pronator drift.   Finger to nose testing intact.  Gait narrow-based and steady, able to tandem walk adequately.  Romberg negative.   IMPRESSION: This is an 82 yo RH woman with a history of episodes of loss of consciousness as a teenager, and 2 witnessed convulsions in 2008 and 2013. The last seizure occurred in the setting of reduced Trileptal dose and not  taking lorazepam the night prior. Prior EEGs have shown left frontotemporal sharp waves, suggestive of left temporal lobe epilepsy. She has stopped benzodiazepines. She remains seizure-free since 2013 on oxcarbazepine 300mg  BID, refills sent. She is aware of [place] driving laws to stop driving after a seizure until 6 months seizure-free. Follow-up in 1 year, call for any changes.    Thank you for allowing me to participate in her care.  Please do not hesitate to call for any questions or concerns.    Patrcia Dolly, M.D.   CC: Dr. [name]

## 2022-07-27 NOTE — Patient Instructions (Signed)
Always good to see you. Continue Oxcarbazepine 300mg  twice a day. Follow-up in 1 year, call for any changes.    Seizure Precautions: 1. If medication has been prescribed for you to prevent seizures, take it exactly as directed.  Do not stop taking the medicine without talking to your doctor first, even if you have not had a seizure in a long time.   2. Avoid activities in which a seizure would cause danger to yourself or to others.  Don't operate dangerous machinery, swim alone, or climb in high or dangerous places, such as on ladders, roofs, or girders.  Do not drive unless your doctor says you may.  3. If you have any warning that you may have a seizure, lay down in a safe place where you can't hurt yourself.    4.  No driving for 6 months from last seizure, as per [facility].   Please refer to the following link on the Epilepsy Foundation of America's website for more information: http://www.epilepsyfoundation.org/answerplace/Social/driving/drivingu.cfm   5.  Maintain good sleep hygiene. Avoid alcohol.  6.  Contact your doctor if you have any problems that may be related to the medicine you are taking.  7.  Call 911 and bring the patient back to the ED if:        A.  The seizure lasts longer than 5 minutes.       B.  The patient doesn't awaken shortly after the seizure  C.  The patient has new problems such as difficulty seeing, speaking or moving  D.  The patient was injured during the seizure  E.  The patient has a temperature over 102 F (39C)  F.  The patient vomited and now is having trouble breathing

## 2022-08-08 DIAGNOSIS — Z85828 Personal history of other malignant neoplasm of skin: Secondary | ICD-10-CM | POA: Diagnosis not present

## 2022-08-08 DIAGNOSIS — L578 Other skin changes due to chronic exposure to nonionizing radiation: Secondary | ICD-10-CM | POA: Diagnosis not present

## 2022-08-08 DIAGNOSIS — D229 Melanocytic nevi, unspecified: Secondary | ICD-10-CM | POA: Diagnosis not present

## 2022-08-08 DIAGNOSIS — L219 Seborrheic dermatitis, unspecified: Secondary | ICD-10-CM | POA: Diagnosis not present

## 2022-08-08 DIAGNOSIS — B351 Tinea unguium: Secondary | ICD-10-CM | POA: Diagnosis not present

## 2022-08-08 DIAGNOSIS — L821 Other seborrheic keratosis: Secondary | ICD-10-CM | POA: Diagnosis not present

## 2022-08-08 DIAGNOSIS — L814 Other melanin hyperpigmentation: Secondary | ICD-10-CM | POA: Diagnosis not present

## 2022-08-08 DIAGNOSIS — D1801 Hemangioma of skin and subcutaneous tissue: Secondary | ICD-10-CM | POA: Diagnosis not present

## 2022-08-20 ENCOUNTER — Telehealth: Payer: Self-pay | Admitting: Gastroenterology

## 2022-08-20 NOTE — Telephone Encounter (Signed)
Patient called requesting to speak with a nurse states she is experimenting diarrhea when she coughs and sometimes even gets nauseous.

## 2022-08-21 NOTE — Telephone Encounter (Signed)
No rectal bleeding, diarrhea off/on for the past several days. No fever. The pt states she is fatigued and not feeling well.  She has a history of diverticulitis.  She has not had any pain in the abd but did have a slight discomfort a few days ago.  Her appetite is not as good as normal.  She tells me that she has not been drinking very much water and feels she may be dehydrated.  She is also calling her PCP for follow up as well.  I have her scheduled for 8/20 at 10 am with [facility]

## 2022-08-22 ENCOUNTER — Encounter: Payer: Self-pay | Admitting: Internal Medicine

## 2022-08-22 ENCOUNTER — Ambulatory Visit (INDEPENDENT_AMBULATORY_CARE_PROVIDER_SITE_OTHER): Payer: Medicare Other | Admitting: Internal Medicine

## 2022-08-22 ENCOUNTER — Ambulatory Visit (INDEPENDENT_AMBULATORY_CARE_PROVIDER_SITE_OTHER)
Admission: RE | Admit: 2022-08-22 | Discharge: 2022-08-22 | Disposition: A | Discharge status: Home / Self Care | Payer: Medicare Other | Source: Ambulatory Visit | Attending: Internal Medicine | Admitting: Internal Medicine

## 2022-08-22 VITALS — BP 120/70 | HR 71 | Temp 98.0°F | Ht 62.0 in | Wt 136.8 lb

## 2022-08-22 DIAGNOSIS — R630 Anorexia: Secondary | ICD-10-CM

## 2022-08-22 DIAGNOSIS — R053 Chronic cough: Secondary | ICD-10-CM

## 2022-08-22 DIAGNOSIS — R1032 Left lower quadrant pain: Secondary | ICD-10-CM | POA: Diagnosis not present

## 2022-08-22 DIAGNOSIS — I77819 Aortic ectasia, unspecified site: Secondary | ICD-10-CM | POA: Diagnosis not present

## 2022-08-22 DIAGNOSIS — R197 Diarrhea, unspecified: Secondary | ICD-10-CM

## 2022-08-22 DIAGNOSIS — Z22358 Carrier of other enterobacterales: Secondary | ICD-10-CM

## 2022-08-22 DIAGNOSIS — R140 Abdominal distension (gaseous): Secondary | ICD-10-CM | POA: Diagnosis not present

## 2022-08-22 DIAGNOSIS — R5381 Other malaise: Secondary | ICD-10-CM

## 2022-08-22 DIAGNOSIS — I700 Atherosclerosis of aorta: Secondary | ICD-10-CM | POA: Diagnosis not present

## 2022-08-22 LAB — COMPREHENSIVE METABOLIC PANEL
ALT: 21 U/L (ref 0–35)
AST: 26 U/L (ref 0–37)
Albumin: 4.6 g/dL (ref 3.5–5.2)
Alkaline Phosphatase: 81 U/L (ref 39–117)
BUN: 13 mg/dL (ref 6–23)
CO2: 27 mEq/L (ref 19–32)
Calcium: 9.8 mg/dL (ref 8.4–10.5)
Chloride: 89 mEq/L — ABNORMAL LOW (ref 96–112)
Creatinine, Ser: 0.57 mg/dL (ref 0.40–1.20)
GFR: 83.65 mL/min (ref >60.00)
Glucose, Bld: 90 mg/dL (ref 70–99)
Potassium: 4.3 mEq/L (ref 3.5–5.1)
Sodium: 124 mEq/L — ABNORMAL LOW (ref 135–145)
Total Bilirubin: 0.5 mg/dL (ref 0.2–1.2)
Total Protein: 8.1 g/dL (ref 6.0–8.3)

## 2022-08-22 LAB — CBC WITH DIFFERENTIAL/PLATELET
Basophils Absolute: 0 10*3/uL (ref 0.0–0.1)
Basophils Relative: 0.6 % (ref 0.0–3.0)
Eosinophils Absolute: 0 10*3/uL (ref 0.0–0.7)
Eosinophils Relative: 0.1 % (ref 0.0–5.0)
HCT: 43.1 % (ref 36.0–46.0)
Hemoglobin: 14.9 g/dL (ref 12.0–15.0)
Lymphocytes Relative: 32.2 % (ref 12.0–46.0)
Lymphs Abs: 2.4 10*3/uL (ref 0.7–4.0)
MCHC: 34.5 g/dL (ref 30.0–36.0)
MCV: 93.1 fl (ref 78.0–100.0)
Monocytes Absolute: 0.7 10*3/uL (ref 0.1–1.0)
Monocytes Relative: 9.6 % (ref 3.0–12.0)
Neutro Abs: 4.3 10*3/uL (ref 1.4–7.7)
Neutrophils Relative %: 57.5 % (ref 43.0–77.0)
Platelets: 250 10*3/uL (ref 150.0–400.0)
RBC: 4.63 Mil/uL (ref 3.87–5.11)
RDW: 12.7 % (ref 11.5–15.5)
WBC: 7.5 10*3/uL (ref 4.0–10.5)

## 2022-08-22 LAB — URINALYSIS, ROUTINE W REFLEX MICROSCOPIC
Bilirubin Urine: NEGATIVE
Hgb urine dipstick: NEGATIVE
Leukocytes,Ua: NEGATIVE
Nitrite: NEGATIVE
Specific Gravity, Urine: 1.02 (ref 1.000–1.030)
Total Protein, Urine: NEGATIVE
Urine Glucose: NEGATIVE
Urobilinogen, UA: 0.2 (ref 0.0–1.0)
pH: 6 (ref 5.0–8.0)

## 2022-08-22 LAB — LIPASE: Lipase: 29 U/L (ref 11.0–59.0)

## 2022-08-22 NOTE — Progress Notes (Signed)
[facility]: [phone]   Routine Medical Office Visit  Patient:  Joan Mclaughlin      Age: 82 y.o.       Sex:  female  Date:   08/22/2022 Patient Care Team: Ardith Dark, MD as PCP - General (Family Medicine) Holli Humbles, MD as Referring Physician (Ophthalmology) Carman Ching, MD (Inactive) as Consulting Physician (Gastroenterology) Glyn Ade, PA-C as Physician Assistant (Dermatology) Van Clines, MD as Consulting Physician (Neurology) Felecia Shelling, DPM as Consulting Physician (Podiatry) Clark-Burning, Victorino Dike, PA-C (Inactive) (Dermatology) Today's Healthcare Provider: Lula Olszewski, MD   Assessment and Plan:   Patient of Dr. Rhys Martini, seeing me for acute evaluation of a few weeks of feeling bad with some gastrointestinal symptom(s).  Assessment & Plan Bloating She presents with recent onset bloating, poor appetite, and diarrhea, with a history of diverticulitis in 2016, suggesting possible lactose intolerance or another GI disorder. There have been no recent episodes of vomiting, fever, or blood in the stool. We will order a comprehensive lab panel to rule out systemic causes, conduct stool studies for GI infections, and perform an abdominal x-ray to exclude constipation or bowel rupture. She is advised to follow a lactose-restricted diet to assess for lactose intolerance and provided a list of over-the-counter medications for symptom management, including Gas X and Imodium.We will await lab results and assess her response to lactose restriction. A nurse will call with results due to her difficulty with computer use.  Diarrhea, unspecified type  Poor appetite  Chronic cough She has had a persistent cough since a COVID infection in February - I decided not to investigate this since the time course is closely associated to COVID and it was a secondary concern. Later I thought to add a CXR order after she left for her to get  done while doing axr.  ESBL E. coli carrier She has a history of ESBL in a urine sample from March. We will repeat the urine test to assess the current status. Malaise     Orders          Ordered    Comprehensive metabolic panel        08/22/22 1225    CBC with Differential/Platelet        08/22/22 1225    Urinalysis, Routine w reflex microscopic        08/22/22 1225    Potassium, stool        08/22/22 1225    Sodium, stool        08/22/22 1225    Osmolality, stool        08/22/22 1225    Ova and parasite examination        08/22/22 1225    Clostridium Difficile by PCR        08/22/22 1225    Calprotectin, Fecal        08/22/22 1225    Fecal lactoferrin, quant        08/22/22 1225    DG Abd 2 Views        08/22/22 1225    Lipase        08/22/22 1225            Recommended follow up: 1 week follow up recommended, review labs and response to diet treatment(s)  Future Appointments  Date Time Provider Department Center  08/28/2022 10:00 AM Arnaldo Natal, NP [facility]  08/30/2022  2:20 PM Ardith Dark, MD [facility]  07/25/2023  10:15 AM [facility] ANNUAL WELLNESS VISIT 1 [facility]  07/30/2023  2:00 PM Van Clines, MD [facility] None           Clinical Presentation:    82 y.o. female who has Insomnia; Localization-related idiopathic epilepsy and epileptic syndromes with seizures of localized onset, not intractable, without status epilepticus (HCC); Dermatochalasis of both upper eyelids; Allergic rhinitis; History of cold sores; Dyslipidemia; Tubular adenoma of colon; History of colonic polyps; Abnormal colonoscopy; Essential hypertension; Constipation; Diverticulosis; TMJ pain dysfunction syndrome; Osteoarthritis; Seborrheic dermatitis; and Stress on their problem list. Her reasons/main concerns/chief complaints for today's office visit are Nausea, Decreased appetite, and Diarrhea (Symptoms for about two weeks or longer.)   AI-Extracted:  Discussed the use of AI scribe software for clinical note transcription with the patient, who gave verbal consent to proceed.  History of Present Illness   The patient, an 82 year old with a history of diverticulitis in 2016, presents with a two-week history of gastrointestinal symptoms and poor sleep. She reports bloating and altered bowel habits, with a normal bowel movement in the morning followed by diarrhea approximately half an hour later. This pattern has been consistent, with an episode of nocturnal diarrhea occurring about a week and a half ago. The patient also notes a decreased appetite, which started around the same time as the other symptoms.  In addition to these gastrointestinal symptoms, the patient has been experiencing a chronic cough since recovering from COVID-19 in February. The cough has lessened in frequency and severity but is still present.  The patient has been researching potential causes for her symptoms online, considering dehydration and lactose intolerance as possible explanations. She also reports a sensation in the lower left abdomen, which she describes as not normal but not painful. This sensation is reminiscent of her previous experience with diverticulitis.  The patient denies any vomiting but does report a gagging sensation when coughing. She also mentions a long-standing issue with postnasal drip, which she manages with Zyrtec for seasonal allergies.  The patient's symptoms have been distressing, particularly the diarrhea, which she describes as the most upsetting symptom. She has considered seeking emergency care but has not done so yet. She expresses hope that her symptoms could be due to something simple like lactose intolerance but is open to further investigation to determine the cause.     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  Problem overviews that were updated today: No problems updated.  Current Outpatient Medications on File Prior to Visit  Medication Sig   amLODipine (NORVASC) 5 MG tablet Take 1 tablet (5 mg total) by mouth daily.   Ascorbic Acid (VITAMIN C) 1000 MG tablet Take 1,000 mg by mouth daily.   b complex vitamins capsule Take 1 capsule by mouth daily.   CALCIUM-MAGNESIUM PO Take 1 tablet by mouth daily. 1000 mg / 500 mg   Cholecalciferol (VITAMIN D3) 50 MCG (2000 UT) capsule Take 2,000 Units by mouth daily.   hydrocortisone 2.5 % cream Apply topically 2 (two) times daily as needed (Rash). (Patient  taking differently: Apply 1 application  topically 2 (two) times daily as needed (Rosacea).)   hydrOXYzine (ATARAX) 10 MG tablet TAKE 1 TO 2 TABLETS (10-20 MG TOTAL) BY MOUTH AT BEDTIME   ketoconazole (NIZORAL) 2 % shampoo Use 2-3 times weekly as needed   Lysine 500 MG TABS Take 500 mg by mouth daily.   MELATONIN PO Take 1 capsule by mouth at bedtime.   Multiple Vitamin (MULTIVITAMIN WITH MINERALS) TABS tablet Take 1 tablet by mouth daily.   Omega-3 Fatty Acids (FISH OIL PO) Take 630 mg by mouth daily.   omeprazole (PRILOSEC) 40 MG capsule Take 40 mg by mouth daily.   Oxcarbazepine (TRILEPTAL) 300 MG tablet TAKE 1 TABLET BY MOUTH TWICE A DAY   Potassium 99 MG TABS Take 99 mg by mouth daily.   Probiotic Product (PROBIOTIC DAILY) CAPS Take 1 capsule by mouth daily. Woman   Red  Yeast Rice 600 MG CAPS Take 1,200 mg by mouth at bedtime. With Co-Q-10 100 mg   triamcinolone cream (KENALOG) 0.1 % Apply 1 Application topically daily.   TURMERIC PO Take 600 mg by mouth daily.   valACYclovir (VALTREX) 500 MG tablet TAKE 1 TABLET BY MOUTH DAILY AS NEEDED (COLD SORES).   No current facility-administered medications on file prior to visit.   There are no discontinued medications.       Clinical Data Analysis:   Physical Exam  BP 120/70 (BP Location: Left Arm, Patient Position: Sitting)   Pulse 71   Temp 98 F (36.7 C) (Temporal)   Ht 5\' 2"  (1.575 m)   Wt 136 lb 12.8 oz (62.1 kg)   SpO2 97%   BMI 25.02 kg/m  Wt Readings from Last 10 Encounters:  08/22/22 136 lb 12.8 oz (62.1 kg)  07/27/22 138 lb (62.6 kg)  07/23/22 140 lb (63.5 kg)  07/20/22 139 lb 6.4 oz (63.2 kg)  07/19/22 139 lb (63 kg)  07/09/22 139 lb 8 oz (63.3 kg)  05/18/22 134 lb 3.2 oz (60.9 kg)  05/08/22 135 lb 9.6 oz (61.5 kg)  04/20/22 135 lb (61.2 kg)  04/13/22 135 lb 3.2 oz (61.3 kg)   Vital signs reviewed.  Nursing notes reviewed. Weight trend reviewed. Abnormalities and Problem-Specific physical exam findings:    General Appearance:  No acute distress appreciable.   Well-groomed, healthy-appearing female.  Well proportioned with no abnormal fat distribution.  Good muscle tone. Skin: Clear and well-hydrated. Pulmonary:  Normal work of breathing at rest, no respiratory distress apparent. SpO2: 97 %  Musculoskeletal: All extremities are intact.  Neurological:  Awake, alert, oriented, and engaged.  No obvious focal neurological deficits or cognitive impairments.  Sensorium seems unclouded.   Speech is clear and coherent with logical content. Psychiatric:  Appropriate mood, pleasant and cooperative demeanor, thoughtful and engaged during the exam  Results Reviewed:     No results found for any visits on 08/22/22.  Office Visit on 07/23/2022  Component Date Value   WBC 07/23/2022 6.6    RBC  07/23/2022 4.01    Platelets 07/23/2022 225.0    Hemoglobin 07/23/2022 13.0    HCT 07/23/2022 38.1    MCV 07/23/2022 94.8    MCHC 07/23/2022 34.1    RDW 07/23/2022 13.4   Office Visit on 03/09/2022  Component Date Value   Color, UA 03/09/2022 yellow    Clarity, UA 03/09/2022 clear    Glucose, UA 03/09/2022 Negative    Bilirubin, UA 03/09/2022 neg    Ketones, UA 03/09/2022 neg  Spec Grav, UA 03/09/2022 1.025    Blood, UA 03/09/2022 neg    pH, UA 03/09/2022 5.0    Protein, UA 03/09/2022 Positive (A)    Urobilinogen, UA 03/09/2022 negative (A)    Nitrite, UA 03/09/2022 negative    Leukocytes, UA 03/09/2022 Negative    MICRO NUMBER: 03/09/2022 [id number]    SPECIMEN QUALITY: 03/09/2022 Adequate    Sample Source 03/09/2022 URINE    STATUS: 03/09/2022 FINAL    ISOLATE 1: 03/09/2022 ESBL Escherichia coli (A)   Office Visit on 03/06/2022  Component Date Value   SARS Coronavirus 2 Ag 03/06/2022 Negative   Office Visit on 01/24/2022  Component Date Value   Sodium 01/24/2022 135    Potassium 01/24/2022 4.3    Chloride 01/24/2022 98    CO2 01/24/2022 32    Glucose, Bld 01/24/2022 79    BUN 01/24/2022 16    Creatinine, Ser 01/24/2022 0.52    Total Bilirubin 01/24/2022 0.4    Alkaline Phosphatase 01/24/2022 58    AST 01/24/2022 23    ALT 01/24/2022 21    Total Protein 01/24/2022 7.4    Albumin 01/24/2022 4.3    GFR 01/24/2022 85.87    Calcium 01/24/2022 9.4    WBC 01/24/2022 6.0    RBC 01/24/2022 4.38    Hemoglobin 01/24/2022 14.0    HCT 01/24/2022 40.9    MCV 01/24/2022 93.4    MCHC 01/24/2022 34.3    RDW 01/24/2022 13.3    Platelets 01/24/2022 260.0    Neutrophils Relative % 01/24/2022 47.4    Lymphocytes Relative 01/24/2022 39.4    Monocytes Relative 01/24/2022 12.4 (H)    Eosinophils Relative 01/24/2022 0.1    Basophils Relative 01/24/2022 0.7    Neutro Abs 01/24/2022 2.9    Lymphs Abs 01/24/2022 2.4    Monocytes Absolute 01/24/2022 0.7    Eosinophils Absolute  01/24/2022 0.0    Basophils Absolute 01/24/2022 0.0    No image results found.   No results found.  No results found.    This encounter employed real-time, collaborative documentation. The patient actively reviewed and updated their medical record on a shared screen, ensuring transparency and facilitating joint problem-solving for the problem list, overview, and plan. This approach promotes accurate, informed care. The treatment plan was discussed and reviewed in detail, including medication safety, potential side effects, and all patient questions. We confirmed understanding and comfort with the plan. Follow-up instructions were established, including contacting the office for any concerns, returning if symptoms worsen, persist, or new symptoms develop, and precautions for potential emergency department visits. ----------------------------------------------------- Lula Olszewski, MD  08/22/2022 1:51 PM  [facility]:  [phone]

## 2022-08-22 NOTE — Patient Instructions (Addendum)
VISIT SUMMARY:  During your visit, we discussed your recent gastrointestinal symptoms, including bloating, poor appetite, and diarrhea, as well as your chronic cough. We also noted your history of diverticulitis and ESBL in a urine sample. You expressed hope that your symptoms might be due to something simple like lactose intolerance, and we agreed to investigate further to determine the cause.  YOUR PLAN:  -GASTROINTESTINAL SYMPTOMS: Your symptoms suggest possible lactose intolerance or another digestive disorder. We will conduct lab tests, stool studies, and an abdominal x-ray to rule out other causes. In the meantime, try following a lactose-restricted diet and use over-the-counter medications like Gas X and Imodium to manage your symptoms.  -CHRONIC COUGH: You've had a persistent cough since recovering from COVID-19. We didn't discuss a specific plan for this issue during this visit.  -URINARY ESBL CARRIER: You had ESBL in a urine sample in March. We will repeat the urine test to check the current status.  INSTRUCTIONS:  We will wait for the results of your lab tests and see how you respond to the lactose-restricted diet. A nurse will call you with the results, as you have difficulty using a computer. Please continue to monitor your symptoms and seek emergency care if they worsen significantly.  here's a list of reasons to go to the ER and some conservative management strategies while waiting for lab results: Reasons to go to the ER:  Severe, persistent abdominal pain that doesn't improve or worsens High fever (above 101F or 38.3C) Prolonged or severe vomiting Signs of dehydration (excessive thirst, dark urine, dizziness, dry mouth) Bloody stools or persistent rectal bleeding Inability to pass stools or gas Rapid heart rate or breathing Confusion or altered mental state Fainting or severe weakness Signs of peritonitis (rigid, board-like abdomen, severe pain with  movement)  Conservative management while waiting for labs:  Diet modification:  Clear liquid diet to rest the bowel Gradually introduce low-fiber foods as symptoms improve Avoid foods known to trigger symptoms   Hydration:  Drink plenty of clear fluids to prevent dehydration Consider oral rehydration solutions if diarrhea is severe   Heat therapy:  Apply a warm compress to the lower left quadrant to relieve discomfort   Rest:  Avoid strenuous activities and get adequate rest   Over-the-counter medications (if approved by healthcare provider):  Acetaminophen for pain and fever Simethicone for bloating Loperamide for diarrhea (use with caution and only if infection has been ruled out)   Position:  Try lying on the left side or in a fetal position to relieve discomfort   Stress reduction:  Practice relaxation techniques like deep breathing or meditation   Avoid:  NSAIDs (e.g., ibuprofen, naproxen) as they may irritate the digestive tract Alcohol and caffeine Smoking   Monitor symptoms:  Keep a log of symptoms, including frequency and severity Note any changes in bowel movements or abdominal pain   Follow-up:  Maintain communication with the healthcare provider Report any significant changes in symptoms promptly    Remind the patient to seek immediate medical attention if any of the ER-worthy symptoms develop or if their condition significantly worsens. These conservative measures are temporary while awaiting further evaluation and should not replace professional medical advice or treatment.   It was a pleasure seeing you today! Your health and satisfaction are our top priorities.   Glenetta Hew, MD  Next Steps:  [x]  Flexible Follow-Up: We recommend a follow up with your primary care, a specialist, or me within 1-2 weeks for ensuring your problem  resolves. This allows for progress monitoring and treatment adjustments. [x]  Early Intervention:  Schedule sooner appointment, call our on-call services, or go to emergency room if there is Increase in pain or discomfort New or worsening symptoms Sudden or severe changes in your health [x]  Lab & X-ray Appointments: complete or schedule to complete today, or call to schedule.  X-rays: [facility] (M-F, 8:30am-noon or 1pm-5pm).  Making the Most of Our Focused (20 minute) Follow Up Appointments:  [x]   Clearly state your top concerns at the beginning of the visit to focus our discussion [x]   If you anticipate you will need more time, please inform the front desk during scheduling - we can book multiple appointments in the same week. [x]   If you have transportation problems- use our convenient video appointments or ask about transportation support. [x]   We can get down to business faster if you use MyChart to update information before the visit and submit non-urgent questions before your visit. Thank you for taking the time to provide details through MyChart.  Let our nurse know and she can import this information into your encounter documents.  Arrival and Wait Times: [x]   Arriving on time ensures that everyone receives prompt attention. [x]   Early morning (8a) and afternoon (1p) appointments tend to have shortest wait times. [x]   Unfortunately, we cannot delay appointments for late arrivals or hold slots during phone calls.  Getting Answers and Following Up  [x]   Simple Questions & Concerns: For quick questions or basic follow-up after your visit, reach [place] at [phone] or MyChart messaging. [x]   Complex Concerns: If your concern is more complex, scheduling an appointment might be best. Discuss this with the staff to find the most suitable option. [x]   Lab & Imaging Results: We'll contact you directly if results are abnormal or you don't use MyChart. Most normal results will be on MyChart within 2-3 business days, with a review message from Dr. Jon Billings. Haven't heard back  in 2 weeks? Need results sooner? Contact us at [phone]. [x]   Referrals: Our referral coordinator will manage specialist referrals. The specialist's office should contact you within 2 weeks to schedule an appointment. Call us if you haven't heard from them after 2 weeks.  Staying Connected  [x]   MyChart: Activate your MyChart for the fastest way to access results and message [place]. See the last page of this paperwork for instructions on how to activate.  Bring to Your Next Appointment  [x]   Medications: Please bring all your medication bottles to your next appointment to ensure we have an accurate record of your prescriptions. [x]   Health Diaries: If you're monitoring any health conditions at home, keeping a diary of your readings can be very helpful for discussions at your next appointment.  Billing  [x]   X-ray & Lab Orders: These are billed by separate companies. Contact the invoicing company directly for questions or concerns. [x]   Visit Charges: Discuss any billing inquiries with our administrative services team.  Your Satisfaction Matters  [x]   Share Your Experience: We strive for your satisfaction! If you have any complaints, or preferably compliments, please let Dr. Jon Billings know directly or contact our Practice Administrators, Edwena Felty or [employer], by asking at the front desk.   Reviewing Your Records  [x]   Review this early draft of your clinical encounter notes below and the final encounter summary tomorrow on MyChart after its been completed.   Bloating -     Comprehensive metabolic panel -  CBC with Differential/Platelet -     Urinalysis, Routine w reflex microscopic -     Potassium, stool -     Sodium, stool -     Osmolality, stool -     Ova and parasite examination -     Clostridium Difficile by PCR -     Calprotectin, Fecal -     Fecal lactoferrin, quant -     DG Abd 2 Views; Future -     Lipase  Diarrhea, unspecified type -     Comprehensive  metabolic panel -     CBC with Differential/Platelet -     Urinalysis, Routine w reflex microscopic -     Potassium, stool -     Sodium, stool -     Osmolality, stool -     Ova and parasite examination -     Clostridium Difficile by PCR -     Calprotectin, Fecal -     Fecal lactoferrin, quant -     DG Abd 2 Views; Future -     Lipase  Poor appetite -     DG Abd 2 Views; Future -     Lipase  Chronic cough  ESBL E. coli carrier

## 2022-08-24 ENCOUNTER — Other Ambulatory Visit: Payer: Self-pay | Admitting: *Deleted

## 2022-08-24 ENCOUNTER — Telehealth: Payer: Self-pay | Admitting: Family Medicine

## 2022-08-24 DIAGNOSIS — R197 Diarrhea, unspecified: Secondary | ICD-10-CM | POA: Diagnosis not present

## 2022-08-24 DIAGNOSIS — R140 Abdominal distension (gaseous): Secondary | ICD-10-CM | POA: Diagnosis not present

## 2022-08-24 MED ORDER — VALACYCLOVIR HCL 500 MG PO TABS: ORAL_TABLET | ORAL | 1 refills | Status: DC

## 2022-08-24 NOTE — Telephone Encounter (Signed)
Prescription Request  08/24/2022  LOV: 07/20/2022  What is the name of the medication or equipment?  valACYclovir (VALTREX) 500 MG tablet   Have you contacted your pharmacy to request a refill? No   Which pharmacy would you like this sent to?  [facility] [place], [place] - [address] [facility] [place] [address] Phone: [phone] Fax: [phone]    Patient notified that their request is being sent to the clinical staff for review and that they should receive a response within 2 business days.   Please advise at Mobile [phone] (mobile)

## 2022-08-24 NOTE — Telephone Encounter (Signed)
Rx send to [facility]

## 2022-08-25 ENCOUNTER — Encounter: Payer: Self-pay | Admitting: Internal Medicine

## 2022-08-25 DIAGNOSIS — E871 Hypo-osmolality and hyponatremia: Secondary | ICD-10-CM | POA: Insufficient documentation

## 2022-08-25 NOTE — Progress Notes (Signed)
FYI Dr. [name], I've added these issues all to problem list as a single problem (hyponatremia, which is the most severe) for easy follow up.  Patient labs from 08/22/2022 show hyponatremia (Na 124 mEq/L) and hypochloremia (Cl 89 mEq/L). Other electrolytes, liver function, and CBC within normal limits. GFR normal at 83.65 mL/min. Analysis: Hyponatremia likely contributing to patient's GI symptoms and fatigue. Cause unclear - could be related to recent illness, medications, or other factors. Need to rule out SIADH, adrenal insufficiency, and other potential causes.  I left voicemail but couldn't reach by phone 8/17. Please reach out to her with this Plan:  Increase oral fluid intake (6-8 glasses/day), avoiding overhydration. Repeat electrolytes in 3-5 days. Office visit next week for full evaluation. Monitor for worsening symptoms, especially neurological changes.  Please emphasize the importance of prompt follow-up and symptom monitoring. If unable to reach patient, leave a voicemail stressing the need for repeat labs and office visit. Your role in communicating this information is crucial, and I appreciate your expertise in patient care.

## 2022-08-26 LAB — CLOSTRIDIUM DIFFICILE BY PCR: Toxigenic C. Difficile by PCR: POSITIVE — AB

## 2022-08-28 ENCOUNTER — Ambulatory Visit: Payer: Medicare Other | Admitting: Nurse Practitioner

## 2022-08-28 ENCOUNTER — Encounter: Payer: Self-pay | Admitting: Nurse Practitioner

## 2022-08-28 ENCOUNTER — Other Ambulatory Visit: Payer: Medicare Other

## 2022-08-28 VITALS — BP 120/60 | HR 78 | Ht 62.0 in | Wt 140.0 lb

## 2022-08-28 DIAGNOSIS — A498 Other bacterial infections of unspecified site: Secondary | ICD-10-CM | POA: Diagnosis not present

## 2022-08-28 DIAGNOSIS — E871 Hypo-osmolality and hyponatremia: Secondary | ICD-10-CM | POA: Diagnosis not present

## 2022-08-28 DIAGNOSIS — Z86010 Personal history of colonic polyps: Secondary | ICD-10-CM

## 2022-08-28 DIAGNOSIS — R5383 Other fatigue: Secondary | ICD-10-CM

## 2022-08-28 LAB — CALPROTECTIN, FECAL: Calprotectin, Fecal: 137 ug/g — ABNORMAL HIGH (ref 0–120)

## 2022-08-28 NOTE — Progress Notes (Signed)
Attending Physician's Attestation   I have reviewed the chart.   I agree with the Advanced Practitioner's note, impression, and recommendations with any updates as below. Reasonable since she is doing better to monitor for now.  Repeat testing and [facility] toxin testing does make sense if she has recurrent diarrhea to know if she will need treatment.   Corliss Parish, MD [facility] Office # [phone]

## 2022-08-28 NOTE — Progress Notes (Signed)
08/28/2022 Joan Mclaughlin [id number] Aug 30, 1940   Chief Complaint: Fatigue and diarrhea   History of Present Illness: Genevra Sabet is an 82 year old female with a past medical history of anxiety, hyperlipidemia, hypertension, seizures, diverticulitis and colon polyps. She is known by Dr. Meridee Score. She had abrupt onset of nonbloody diarrhea during the night two weeks ago. She described passing numerous episodes of diarrhea with associated profound fatigue. The next few days she passed a solid stool in the morning and a loose stool later in the day. She had mild LLQ discomfort, no severe abdominal pain which lasted for 1 or 2 days then abated. She was seen by Dr. Glenetta Hew 08/22/2022 and laboratory studies showed a low sodium level of 124. BUN 13. Cr. 0.57.  Toxigenic C. difficile by PCR was positive.  Fecal calprotectin and fecal lactoferrin levels pending. She stated submitting a near solid stool specimen for the C. Diff test 09/23/22. She has passed a normal formed solid stool for the past 3 days. No rectal bleeding or black stools. She endorsed taking an antibiotic three times over the past 6 to 8 months for a UTI (treated with Bactrim for UTI 03/09/2022).  She is tolerating a solid diet.  She is trying to drink 8 glasses of water daily.  She denies excessive water intake.  Her energy level has improved but overall does not feel 100% back to baseline.  Her most recent colonoscopy was 08/31/2020 which identified 1 hyperplastic polyp removed from the transverse colon and post mucosectomy scar in the mid transverse colon was benign.     Latest Ref Rng & Units 08/22/2022   12:34 PM 07/23/2022   11:44 AM 01/24/2022    9:51 AM  CBC  WBC 4.0 - 10.5 K/uL 7.5  6.6  6.0   Hemoglobin 12.0 - 15.0 g/dL 40.9  81.1  91.4   Hematocrit 36.0 - 46.0 % 43.1  38.1  40.9   Platelets 150.0 - 400.0 K/uL 250.0  225.0  260.0        Latest Ref Rng & Units 08/22/2022   12:34 PM 01/24/2022    9:51 AM  05/24/2020   11:58 AM  CMP  Glucose 70 - 99 mg/dL 90  79  86   BUN 6 - 23 mg/dL 13  16  12    Creatinine 0.40 - 1.20 mg/dL 7.82  9.56  2.13   Sodium 135 - 145 mEq/L 124  135  134   Potassium 3.5 - 5.1 mEq/L 4.3  4.3  4.7   Chloride 96 - 112 mEq/L 89  98  98   CO2 19 - 32 mEq/L 27  32  30   Calcium 8.4 - 10.5 mg/dL 9.8  9.4  9.5   Total Protein 6.0 - 8.3 g/dL 8.1  7.4  7.2   Total Bilirubin 0.2 - 1.2 mg/dL 0.5  0.4  0.5   Alkaline Phos 39 - 117 U/L 81  58  62   AST 0 - 37 U/L 26  23  23    ALT 0 - 35 U/L 21  21  23    Lipase 29  PAST GI PROCEDURES:  Colonoscopy 08/31/2020:  - Hemorrhoids found on digital rectal exam.  - The examined portion of the ileum was normal.  - One 5 mm polyp in the transverse colon, removed with a cold snare. Resected and retrieved.  - Post mucosectomy scar in the mid transverse colon. Polypoid tissue noted in region but unclear that  it is recurrence v clip artifact - this was all been removed/sampled off.  - Diverticulosis in the recto-sigmoid colon, in the sigmoid colon and in the transverse colon.  - Normal mucosa in the entire examined colon otherwise.  - Non-bleeding non-thrombosed external and internal hemorrhoids. -Recall colonoscopy in 3 years, patient to schedule office visit prior to considering another colonoscopy as she will be age 14 at that time   A. COLON, POLYPECTOMY:  - Hyperplastic polyp(s)  - Multiple step sections were examined  B. COLON, TRANSVERSE, SCAR SITE, BIOPSY:  - Colonic mucosa with no specific histopathologic changes  - Negative for dysplasia or malignancy   Colonoscopy 11/02/2019: - Hemorrhoids found on digital rectal exam. - The examined portion of the ileum was normal. - Four 3 to 4 mm polyps in the transverse colon and cecum and in the ascending colon, removed with cold snare. Resected and retrieved. - A tattoo was seen in the transverse colon. A post-polypectomy scar was found at the tattoo site with adjacent 25 mm  polypoid lesion in the transverse colon. Complete removal was accomplished with piecemeal underwater resection. Clips (MR conditional) were placed to decrease risk of post-interventional bleeding. - Normal mucosa in the entire examined colon otherwise. - Non-bleeding non-thrombosed external and internal hemorrhoids. A. COLON, CECAL AND TRANSVERSE, POLYPECTOMY:  - Tubular adenoma.  - No high-grade dysplasia or carcinoma.  - Multiple fragments of benign colonic mucosa.   B. COLON SCAR, TRANSVERSE, EMR:  - Tubular adenoma.  - No high-grade dysplasia or carcinoma.  - Stroma with fibrosis and tattoo pigment.   Colonoscopy 10/20/2018: - Hemorrhoids found on digital rectal exam.  - The examined portion of the ileum was normal.  - Nine 2 to 6 mm polyps in the transverse colon and in the ascending colon, removed with a cold snare. Resected and retrieved.  - Post-polypectomy scar in the distal transverse colon.  - One 35-40 mm polyp in the distal transverse colon - previously worked on, removed with piecemeal mucosal resection. Resected and retrieved. Clips (MR conditional) were placed.  - Diverticulosis in the recto-sigmoid colon and in the sigmoid colon.  - Normal mucosa in the entire examined colon.  - Non-bleeding non-thrombosed external and internal hemorrhoids. A. COLON, ASCENDING, TRANSVERSE, POLYPS:  - Tubular adenoma (multiple fragments).  - No high grade dysplasia or malignancy.   B. COLON, TRANSVERSE, BIOPSY:  - Tubular adenoma (multiple fragments).  - No high grade dysplasia or malignancy.   Colonoscopy 06/24/2018: - Polypoid lesion in the proximal sigmoid colon.  Biopsied.  Tattooed. - Diverticulosis in sigmoid colon. - The examination was otherwise normal.   Past Medical History:  Diagnosis Date   Alcoholism (HCC)    sober for 47 years-noted 09/16/2018   Allergy    Anxiety    Cancer (HCC)    skin cancer   Cholecystitis    Complication of anesthesia    Difficulty  sleeping    takes med to sleep   Diverticulitis    Family history of adverse reaction to anesthesia    SIster - N/V   Gallstones    Ganglion cyst 12/11/2016   GERD (gastroesophageal reflux disease)    "RELATED TO GALLBLADDER"   Heart murmur    "ONLY DETECTED WHEN LYING DOWN"  - 10/16/2018- Not concerned with   History of Lyme disease 09/19/2016   Hyperlipidemia    Hypertension    Lyme disease    Pt states she has had Lyme Disease twice.    Nocturia  Pneumonia    PONV (postoperative nausea and vomiting)    Nausea   S/P laparoscopic cholecystectomy 12/23/2014   Seizure disorder (HCC)    Seizures (HCC)    Seizures (HCC)    "last one 10- 15 years ago - 10/16/2018   Squamous cell carcinoma of skin 12/25/2018   RIGHT FOREARM    Squamous cell carcinoma of skin 09/30/2019   in situ-left lower leg-anterior   Trimalleolar fracture    left   Tubular adenoma of colon 06/27/2018   Past Surgical History:  Procedure Laterality Date   CHOLECYSTECTOMY N/A 12/23/2014   Procedure: LAPAROSCOPIC CHOLECYSTECTOMY WITH INTRAOPERATIVE CHOLANGIOGRAM;  Surgeon: Gaynelle Adu, MD;  Location: [facility] ORS;  Service: General;  Laterality: N/A;   COLONOSCOPY     "3 times trying to get large polyp since August 2020"   COLONOSCOPY WITH PROPOFOL N/A 10/20/2018   Procedure: COLONOSCOPY WITH PROPOFOL;  Surgeon: Lemar Lofty., MD;  Location: [facility] ENDOSCOPY;  Service: Gastroenterology;  Laterality: N/A;   COLONOSCOPY WITH PROPOFOL N/A 11/02/2019   Procedure: COLONOSCOPY WITH PROPOFOL;  Surgeon: Meridee Score Netty Starring., MD;  Location: [facility] ENDOSCOPY;  Service: Gastroenterology;  Laterality: N/A;   COLONOSCOPY WITH PROPOFOL N/A 08/31/2020   Procedure: COLONOSCOPY WITH PROPOFOL;  Surgeon: Meridee Score Netty Starring., MD;  Location: [facility] ENDOSCOPY;  Service: Gastroenterology;  Laterality: N/A;   ENDOSCOPIC MUCOSAL RESECTION N/A 10/20/2018   Procedure: ENDOSCOPIC MUCOSAL RESECTION;  Surgeon: Meridee Score Netty Starring., MD;   Location: [facility] ENDOSCOPY;  Service: Gastroenterology;  Laterality: N/A;   ENDOSCOPIC MUCOSAL RESECTION  11/02/2019   Procedure: ENDOSCOPIC MUCOSAL RESECTION;  Surgeon: Meridee Score Netty Starring., MD;  Location: [facility] ENDOSCOPY;  Service: Gastroenterology;;   HEMOSTASIS CLIP PLACEMENT  10/20/2018   Procedure: HEMOSTASIS CLIP PLACEMENT;  Surgeon: Lemar Lofty., MD;  Location: [facility] ENDOSCOPY;  Service: Gastroenterology;;   HEMOSTASIS CLIP PLACEMENT  11/02/2019   Procedure: HEMOSTASIS CLIP PLACEMENT;  Surgeon: Lemar Lofty., MD;  Location: [facility] ENDOSCOPY;  Service: Gastroenterology;;   ORIF ANKLE FRACTURE Left 02/19/2019   Procedure: OPEN REDUCTION INTERNAL FIXATION (ORIF) LEFT ANKLE FRACTURE;  Surgeon: Terance Hart, MD;  Location: [facility] OR;  Service: Orthopedics;  Laterality: Left;   POLYPECTOMY  10/20/2018   Procedure: POLYPECTOMY;  Surgeon: Mansouraty, Netty Starring., MD;  Location: [facility] ENDOSCOPY;  Service: Gastroenterology;;   POLYPECTOMY  11/02/2019   Procedure: POLYPECTOMY;  Surgeon: Lemar Lofty., MD;  Location: [facility] ENDOSCOPY;  Service: Gastroenterology;;   POLYPECTOMY  08/31/2020   Procedure: POLYPECTOMY;  Surgeon: Lemar Lofty., MD;  Location: [facility] ENDOSCOPY;  Service: Gastroenterology;;   SUBMUCOSAL LIFTING INJECTION  10/20/2018   Procedure: SUBMUCOSAL LIFTING INJECTION;  Surgeon: Lemar Lofty., MD;  Location: [facility] ENDOSCOPY;  Service: Gastroenterology;;   SYNDESMOSIS REPAIR Left 02/19/2019   Procedure: SYNDESMOSIS REPAIR;  Surgeon: Terance Hart, MD;  Location: [facility] OR;  Service: Orthopedics;  Laterality: Left;   THORACIC DISCECTOMY  07/2007   T12   TUBAL LIGATION     WISDOM TOOTH EXTRACTION     Current Outpatient Medications on File Prior to Visit  Medication Sig Dispense Refill   amLODipine (NORVASC) 5 MG tablet Take 1 tablet (5 mg total) by mouth daily. 90 tablet 1   Ascorbic Acid (VITAMIN C) 1000 MG tablet Take 1,000 mg by mouth daily.     b  complex vitamins capsule Take 1 capsule by mouth daily.     CALCIUM-MAGNESIUM PO Take 1 tablet by mouth daily. 1000 mg / 500 mg     Cholecalciferol (VITAMIN D3) 50  MCG (2000 UT) capsule Take 2,000 Units by mouth daily.     hydrocortisone 2.5 % cream Apply topically 2 (two) times daily as needed (Rash). (Patient taking differently: Apply 1 application  topically 2 (two) times daily as needed (Rosacea).) 30 g 11   hydrOXYzine (ATARAX) 10 MG tablet TAKE 1 TO 2 TABLETS (10-20 MG TOTAL) BY MOUTH AT BEDTIME 180 tablet 2   ketoconazole (NIZORAL) 2 % shampoo Use 2-3 times weekly as needed 120 mL 5   Lysine 500 MG TABS Take 500 mg by mouth daily.     MELATONIN PO Take 1 capsule by mouth at bedtime.     Multiple Vitamin (MULTIVITAMIN WITH MINERALS) TABS tablet Take 1 tablet by mouth daily.     Omega-3 Fatty Acids (FISH OIL PO) Take 630 mg by mouth daily.     omeprazole (PRILOSEC) 40 MG capsule Take 40 mg by mouth daily.     Oxcarbazepine (TRILEPTAL) 300 MG tablet TAKE 1 TABLET BY MOUTH TWICE A DAY 180 tablet 3   Potassium 99 MG TABS Take 99 mg by mouth daily.     Probiotic Product (PROBIOTIC DAILY) CAPS Take 1 capsule by mouth daily. Woman     triamcinolone cream (KENALOG) 0.1 % Apply 1 Application topically daily. 30 g 5   TURMERIC PO Take 600 mg by mouth daily.     valACYclovir (VALTREX) 500 MG tablet TAKE 1 TABLET BY MOUTH DAILY AS NEEDED (COLD SORES). 90 tablet 1   No current facility-administered medications on file prior to visit.   Allergies  Allergen Reactions   Amoxicillin-Pot Clavulanate Nausea And Vomiting    Patient denies knowledge of this allergy  Did it involve swelling of the face/tongue/throat, SOB, or low BP? No Did it involve sudden or severe rash/hives, skin peeling, or any reaction on the inside of your mouth or nose? No Did you need to seek medical attention at a hospital or doctor's office? No When did it last happen?      in 2016 If all above answers are "NO", may proceed  with cephalosporin use.    Doxycycline Hives   Suvorexant Other (See Comments)    Nightmares     Current Medications, Allergies, Past Medical History, Past Surgical History, Family History and Social History were reviewed in [employer] record.  Review of Systems:   Constitutional: Negative for fever, sweats, chills or weight loss.  Respiratory: Negative for shortness of breath.   Cardiovascular: Negative for chest pain, palpitations and leg swelling.  Gastrointestinal: See HPI.  Musculoskeletal: Negative for back pain or muscle aches.  Neurological: Negative for dizziness, headaches or paresthesias.   Physical Exam: BP 120/60   Pulse 78   Ht 5\' 2"  (1.575 m)   Wt 140 lb (63.5 kg)   SpO2 96%   BMI 25.61 kg/m   General: 82 year old female in no acute distress. Head: Normocephalic and atraumatic. Eyes: No scleral icterus. Conjunctiva pink . Ears: Normal auditory acuity. Mouth: Dentition intact. No ulcers or lesions.  Lungs: Clear throughout to auscultation. Heart: Regular rate and rhythm, no murmur. Abdomen: Soft, nontender and nondistended. No masses or hepatomegaly. Normal bowel sounds x 4 quadrants.  Rectal: Deferred. Musculoskeletal: Symmetrical with no gross deformities. Extremities: No edema. Neurological: Alert oriented x 4. No focal deficits.  Psychological: Alert and cooperative. Normal mood and affect  Assessment and Recommendations:  82 year-old female with abrupt onset of nonbloody diarrhea 2 weeks ago with multiple episodes x 1 day followed by passing  a solid stool in the am and a loose stool later in the day for about one week. Toxigenic C. difficile by PCR was positive (specimen collected on 8/16, resulted on 8/18). No further mushy stools or diarrhea x 3 to 4 days.  -Defer C. diff treatment at this time as diarrhea abated 3 to 4 days ago.  -Await fecal calprotectin/lactoferrin levels. -If diarrhea recurs, will check C. Diff PCR, C. Diff  toxin A/B and GDH antigen verses initiating Vanco or Dificid -Diet as tolerated   Hyponatremia -Patient to follow up with PCP  Fatigue, likely due to hyponatremia  -Follow up with PCP  History of colon polyps.  Her most recent colonoscopy 08/31/2020 identified one 5 mm hyperplastic polyp removed from the transverse colon and post mucosectomy scar in the mid transverse colon, biopsies showed normal colonic mucosa without evidence of dysplasia. -Consider a surveillance colonoscopy 08/2023 if medically appropriate at that time as she will be 83

## 2022-08-28 NOTE — Patient Instructions (Addendum)
Complete stool tests if diarrhea recurs. Make sure you go by the lab in the basement to pick up the kit.  Follow up with your primary care doctor regarding low sodium level.  Contact our office if mushy or loose stools recur.  Due to recent changes in healthcare laws, you may see the results of your imaging and laboratory studies on MyChart before your provider has had a chance to review them.  We understand that in some cases there may be results that are confusing or concerning to you. Not all laboratory results come back in the same time frame and the provider may be waiting for multiple results in order to interpret others.  Please give [place] 48 hours in order for your provider to thoroughly review all the results before contacting the office for clarification of your results.  Thank you for trusting me with your gastrointestinal care!   Alcide Evener, CRNP

## 2022-08-29 LAB — OVA AND PARASITE EXAMINATION
CONCENTRATE RESULT:: NONE SEEN
MICRO NUMBER:: 15342050
SPECIMEN QUALITY:: ADEQUATE
TRICHROME RESULT:: NONE SEEN

## 2022-08-29 LAB — FECAL LACTOFERRIN, QUANT
Fecal Lactoferrin: NEGATIVE
MICRO NUMBER:: 15342112
SPECIMEN QUALITY:: ADEQUATE

## 2022-08-29 LAB — OSMOLALITY, STOOL

## 2022-08-29 LAB — SODIUM, STOOL

## 2022-08-30 ENCOUNTER — Ambulatory Visit: Payer: Medicare Other | Admitting: Family Medicine

## 2022-08-30 NOTE — Progress Notes (Signed)
Seen by gastrointestinal who shared watchful waiting plan for management. FYI lab test Dr. [name].

## 2022-08-30 NOTE — Progress Notes (Signed)
Key Points for Patient Follow-Up Call:  1. Test Results Summary:    - Stool studies positive for C. Difficile    - Abdominal X-ray: Negative  2. Clinical Interpretation:    - Patient seen by gastroenterology and found to be improving 08/28/22    - No antibiotics prescribed at this time due to improvement  3. Patient Instructions:    - Continue current management as advised by gastroenterology    - Monitor for worsening symptoms (e.g., increased diarrhea, fever, abdominal pain)    - Emphasize importance of hand hygiene to prevent spread  4. Follow-up Needed:    - Schedule follow-up appointment with primary care in 1 week    - Instruct patient to seek immediate care if symptoms worsen  5. Addressing Patient Concerns:    - If patient asks about not receiving antibiotics: "The gastroenterologist found that you're improving without antibiotics. This approach can help prevent antibiotic resistance and reduce the risk of recurrence. However, if your symptoms worsen, please contact us immediately."    - If patient asks about prevention: "To prevent spread, wash hands frequently with soap and water, especially after using the bathroom and before eating. Also, clean surfaces in your home with bleach-based products."  6. Next Steps:    - Ensure patient has our contact information for any concerns    - Remind patient to stay well-hydrated and eat small, frequent meals as tolerated  Thank you for your crucial role in communicating this information to the patient. Your expertise in patient care is invaluable, especially in managing conditions like C. difficile. If you encounter any complex questions or if the patient seems particularly anxious, please don't hesitate to reach out for further guidance. Your attention to detail and compassionate care make a significant difference in our patients' health outcomes.

## 2022-08-31 NOTE — Progress Notes (Signed)
Inform the patient CXR and Abdomen X-Ray normal, so no cause found but no problems found either.

## 2022-08-31 NOTE — Progress Notes (Signed)
Some XR evaluation I did at recent nausea, vomiting, or diarrhea appointment... ?C.diff based on labs but improving. No cause for chronic cough identified.

## 2022-09-06 ENCOUNTER — Ambulatory Visit (INDEPENDENT_AMBULATORY_CARE_PROVIDER_SITE_OTHER): Payer: Medicare Other | Admitting: Internal Medicine

## 2022-09-06 VITALS — BP 122/65 | HR 79 | Temp 97.2°F | Ht 62.0 in | Wt 140.4 lb

## 2022-09-06 DIAGNOSIS — R140 Abdominal distension (gaseous): Secondary | ICD-10-CM

## 2022-09-06 DIAGNOSIS — U099 Post covid-19 condition, unspecified: Secondary | ICD-10-CM | POA: Diagnosis not present

## 2022-09-06 DIAGNOSIS — E871 Hypo-osmolality and hyponatremia: Secondary | ICD-10-CM

## 2022-09-06 DIAGNOSIS — G40909 Epilepsy, unspecified, not intractable, without status epilepticus: Secondary | ICD-10-CM | POA: Diagnosis not present

## 2022-09-06 LAB — CBC WITH DIFFERENTIAL/PLATELET
Basophils Absolute: 0 10*3/uL (ref 0.0–0.1)
Basophils Relative: 0.7 % (ref 0.0–3.0)
Eosinophils Absolute: 0 10*3/uL (ref 0.0–0.7)
Eosinophils Relative: 0.2 % (ref 0.0–5.0)
HCT: 39.1 % (ref 36.0–46.0)
Hemoglobin: 13.2 g/dL (ref 12.0–15.0)
Lymphocytes Relative: 28 % (ref 12.0–46.0)
Lymphs Abs: 1.6 10*3/uL (ref 0.7–4.0)
MCHC: 33.7 g/dL (ref 30.0–36.0)
MCV: 95.2 fl (ref 78.0–100.0)
Monocytes Absolute: 0.5 10*3/uL (ref 0.1–1.0)
Monocytes Relative: 8.6 % (ref 3.0–12.0)
Neutro Abs: 3.5 10*3/uL (ref 1.4–7.7)
Neutrophils Relative %: 62.5 % (ref 43.0–77.0)
Platelets: 256 10*3/uL (ref 150.0–400.0)
RBC: 4.11 Mil/uL (ref 3.87–5.11)
RDW: 13.1 % (ref 11.5–15.5)
WBC: 5.6 10*3/uL (ref 4.0–10.5)

## 2022-09-06 LAB — COMPREHENSIVE METABOLIC PANEL
ALT: 15 U/L (ref 0–35)
AST: 19 U/L (ref 0–37)
Albumin: 4 g/dL (ref 3.5–5.2)
Alkaline Phosphatase: 74 U/L (ref 39–117)
BUN: 14 mg/dL (ref 6–23)
CO2: 26 meq/L (ref 19–32)
Calcium: 9 mg/dL (ref 8.4–10.5)
Chloride: 95 meq/L — ABNORMAL LOW (ref 96–112)
Creatinine, Ser: 0.65 mg/dL (ref 0.40–1.20)
GFR: 81.02 mL/min (ref >60.00)
Glucose, Bld: 129 mg/dL — ABNORMAL HIGH (ref 70–99)
Potassium: 4.1 meq/L (ref 3.5–5.1)
Sodium: 128 meq/L — ABNORMAL LOW (ref 135–145)
Total Bilirubin: 0.5 mg/dL (ref 0.2–1.2)
Total Protein: 6.7 g/dL (ref 6.0–8.3)

## 2022-09-06 LAB — LIPID PANEL
Cholesterol: 181 mg/dL (ref 0–200)
HDL: 47.8 mg/dL (ref >39.00)
LDL Cholesterol: 117 mg/dL — ABNORMAL HIGH (ref 0–99)
NonHDL: 133.47
Total CHOL/HDL Ratio: 4
Triglycerides: 83 mg/dL (ref 0.0–149.0)
VLDL: 16.6 mg/dL (ref 0.0–40.0)

## 2022-09-06 LAB — SEDIMENTATION RATE: Sed Rate: 38 mm/h — ABNORMAL HIGH (ref 0–30)

## 2022-09-06 LAB — C-REACTIVE PROTEIN: CRP: <1 mg/dL (ref 0.5–20.0)

## 2022-09-06 NOTE — Patient Instructions (Addendum)
VISIT SUMMARY:  During your visit, we discussed your ongoing feelings of malaise and bloating, your concerns about low sodium levels, and your history of seizures, gastrointestinal issues, and recent COVID-19 infection. We also discussed your interest in improving your health through dietary changes and potential probiotic use.  YOUR PLAN:  -LOW SODIUM LEVELS: Your sodium levels are lower than normal, which can cause feelings of malaise. We will recheck your sodium and other electrolyte levels. To help increase your sodium levels, we recommend drinking 1-2 liters of Gatorade daily. If your malaise continues and your sodium levels do not improve, we may consider referring you to an endocrinologist, a doctor who specializes in hormone and electrolyte imbalances.  -GASTROINTESTINAL SYMPTOMS: Your bloating and previous positive C. diff screen suggest you may have an overgrowth of bacteria in your small intestine or C. diff overgrowth due to recent antibiotic use. We will start a two-week trial of probiotics, which are beneficial bacteria that can help restore the balance in your gut. We also suggest considering over-the-counter simethicone (Gas-X) for bloating. If your symptoms continue, we will schedule a follow-up appointment and may consider a low carbohydrate diet to counter bacterial overgrowth.  -LONG COVID: You have a persistent cough since your COVID infection in February. We will continue to monitor these symptoms.  -SEIZURE DISORDER: Your seizure medication, Trileptal, may be contributing to your low sodium levels. If your sodium levels do not improve, we may consider changing your medication, depending on the lab results.  INSTRUCTIONS:  Please start drinking 1-2 liters of Gatorade daily and begin a two-week trial of probiotics. Consider using over-the-counter simethicone (Gas-X) for bloating. Monitor your symptoms and if they persist, please schedule a follow-up appointment. We will also  recheck your sodium and other electrolyte levels.   It was a pleasure seeing you today! Your health and satisfaction are our top priorities.   Glenetta Hew, MD  Next Steps:  [x]  Flexible Follow-Up: We recommend a follow up with your primary care, a specialist, or me within 1-2 weeks for ensuring your problem resolves. This allows for progress monitoring and treatment adjustments. [x]  Early Intervention: Schedule sooner appointment, call our on-call services, or go to emergency room if there is Increase in pain or discomfort New or worsening symptoms Sudden or severe changes in your health [x]  Lab & X-ray Appointments: complete or schedule to complete today, or call to schedule.  X-rays: [facility] (M-F, 8:30am-noon or 1pm-5pm).  Making the Most of Our Focused (20 minute) Follow Up Appointments:  [x]   Clearly state your top concerns at the beginning of the visit to focus our discussion [x]   If you anticipate you will need more time, please inform the front desk during scheduling - we can book multiple appointments in the same week. [x]   If you have transportation problems- use our convenient video appointments or ask about transportation support. [x]   We can get down to business faster if you use MyChart to update information before the visit and submit non-urgent questions before your visit. Thank you for taking the time to provide details through MyChart.  Let our nurse know and she can import this information into your encounter documents.  Arrival and Wait Times: [x]   Arriving on time ensures that everyone receives prompt attention. [x]   Early morning (8a) and afternoon (1p) appointments tend to have shortest wait times. [x]   Unfortunately, we cannot delay appointments for late arrivals or hold slots during phone calls.  Getting Answers and Following  Up  [x]   Simple Questions & Concerns: For quick questions or basic follow-up after your visit, reach [place] at [phone]  [phone] or MyChart messaging. [x]   Complex Concerns: If your concern is more complex, scheduling an appointment might be best. Discuss this with the staff to find the most suitable option. [x]   Lab & Imaging Results: We'll contact you directly if results are abnormal or you don't use MyChart. Most normal results will be on MyChart within 2-3 business days, with a review message from Dr. Jon Billings. Haven't heard back in 2 weeks? Need results sooner? Contact us at [phone]. [x]   Referrals: Our referral coordinator will manage specialist referrals. The specialist's office should contact you within 2 weeks to schedule an appointment. Call us if you haven't heard from them after 2 weeks.  Staying Connected  [x]   MyChart: Activate your MyChart for the fastest way to access results and message [place]. See the last page of this paperwork for instructions on how to activate.  Bring to Your Next Appointment  [x]   Medications: Please bring all your medication bottles to your next appointment to ensure we have an accurate record of your prescriptions. [x]   Health Diaries: If you're monitoring any health conditions at home, keeping a diary of your readings can be very helpful for discussions at your next appointment.  Billing  [x]   X-ray & Lab Orders: These are billed by separate companies. Contact the invoicing company directly for questions or concerns. [x]   Visit Charges: Discuss any billing inquiries with our administrative services team.  Your Satisfaction Matters  [x]   Share Your Experience: We strive for your satisfaction! If you have any complaints, or preferably compliments, please let Dr. Jon Billings know directly or contact our Practice Administrators, Edwena Felty or [employer], by asking at the front desk.   Reviewing Your Records  [x]   Review this early draft of your clinical encounter notes below and the final encounter summary tomorrow on MyChart after its been completed.    Hyponatremia -     Amb ref to Medical Nutrition Therapy-MNT -     Lipid panel -     TSH -     Comprehensive metabolic panel -     CBC with Differential/Platelet -     Osmolality, urine -     Creatinine, urine, random -     Osmolality -     TSH + free T4 -     Cortisol -     ACTH -     C-reactive protein -     Sedimentation rate -     B12 and Folate Panel -     Ferritin -     Celiac Disease Comprehensive Panel with Reflexes  Bloating  Long COVID  Seizure disorder (HCC)        Low FODMAP diet:  Avoid fermentable carbohydrates that can feed bacteria Limit foods high in fructose, lactose, fructans, galactans, and polyols   Specific food recommendations:  Proteins: Lean meats, fish, eggs, tofu Vegetables: Carrots, spinach, lettuce, cucumber, zucchini, bell peppers Fruits (in moderation): Berries, citrus fruits, grapes Grains: Gluten-free options like rice, quinoa, oats Fats: Olive oil, coconut oil, avocado   Foods to avoid or limit:  Dairy products (especially if lactose intolerant) High-fiber foods Artificial sweeteners Processed foods Alcohol and caffeine   Meal timing and portion control:  Eat smaller, more frequent meals Avoid large meals, especially before bedtime   Hydration:  Drink plenty of water throughout the day Avoid carbonated beverages  Specific recommendations:  Consider including some probiotic-rich foods like kefir or yogurt (if tolerated) Include easily digestible foods like bone broth or well-cooked vegetables

## 2022-09-06 NOTE — Assessment & Plan Note (Signed)
Was severe at 124 2 weeks ago, but she reports she has been feeling better. Diarrhea gone and bloating mild.  Likely caused by the gastrointestinal issues. Likely causing her malaise. Severe low sodium at 124, likely due to GI losses or Trileptal side effects, has led to malaise. We will draw labs today to recheck sodium and other electrolytes. To replenish sodium, we advise hydration with 1-2 liters of Gatorade daily. Should malaise persist and sodium levels not improve, a referral to an endocrinologist will be considered.  Given the patient's medical history and current medication list, there are several potential explanations for the low sodium (hyponatremia). Here are the most likely possibilities:  Medication-induced hyponatremia: Oxcarbazepine: This anti-epileptic drug is known to cause hyponatremia, especially in older adults. It's one of the most likely culprits given its strong association with SIADH (Syndrome of Inappropriate Antidiuretic Hormone Secretion). Omeprazole: Proton pump inhibitors have been associated with hyponatremia in some cases. Amlodipine: While less common, calcium channel blockers can occasionally contribute to hyponatremia.  SIADH: This could be related to the patient's epilepsy or induced by medications (particularly oxcarbazepine).  Diuretic effect: The combination of calcium-magnesium supplements and potassium supplements might have a mild diuretic effect, potentially contributing to electrolyte imbalances.  Hormonal imbalances: While not explicitly listed in her problems, the patient's age and history of multiple endocrine-related issues (e.g., osteoarthritis, dyslipidemia) suggest that hormonal factors could play a role.   Gastrointestinal issues: The patient's history of constipation, diverticulosis, and colonoscopy abnormalities could potentially affect fluid and electrolyte absorption.  Stress and lifestyle factors: Chronic stress can affect cortisol  levels and potentially influence fluid and electrolyte balance.  Dietary factors: Low sodium intake or excessive water intake could contribute, especially if combined with other factors.  Chronic conditions: Essential hypertension and its management might influence sodium levels.  Given this information, the most likely explanations are: Oxcarbazepine-induced hyponatremia SIADH (potentially related to epilepsy or medication) Multifactorial hyponatremia due to a combination of medications, supplements, and underlying health conditions  To address this, consider: We reviewing and potentially adjusting the oxcarbazepine dosage but she has been on 300 twice daily long term so will wait for repeat labs Evaluating for SIADH- extensive labwork ordered Assessing fluid intake and dietary sodium she says she doesn't limit these, but feels dehydrated Checking for any recent changes in medication or supplements - she denies any Conducting a thorough endocrine workup to rule out other hormonal imbalances - will check adrenals and thyroid hormones

## 2022-09-06 NOTE — Progress Notes (Signed)
[facility]: [phone]   -- Medical Office Visit --  Patient:  Joan Mclaughlin      Age: 82 y.o.       Sex:  female  Date:   09/06/2022 Patient Care Team: Ardith Dark, MD as PCP - General (Family Medicine) Holli Humbles, MD as Referring Physician (Ophthalmology) Carman Ching, MD (Inactive) as Consulting Physician (Gastroenterology) Glyn Ade, PA-C as Physician Assistant (Dermatology) Van Clines, MD as Consulting Physician (Neurology) Felecia Shelling, DPM as Consulting Physician (Podiatry) Clark-Burning, Victorino Dike, PA-C (Inactive) (Dermatology) Today's Healthcare Provider: Lula Olszewski, MD      Assessment & Plan Hyponatremia Was severe at 124 2 weeks ago, but she reports she has been feeling better. Diarrhea gone and bloating mild.  Likely caused by the gastrointestinal issues. Likely causing her malaise. Severe low sodium at 124, likely due to GI losses or Trileptal side effects, has led to malaise. We will draw labs today to recheck sodium and other electrolytes. To replenish sodium, we advise hydration with 1-2 liters of Gatorade daily. Should malaise persist and sodium levels not improve, a referral to an endocrinologist will be considered.  Given the patient's medical history and current medication list, there are several potential explanations for the low sodium (hyponatremia). Here are the most likely possibilities:  Medication-induced hyponatremia: Oxcarbazepine: This anti-epileptic drug is known to cause hyponatremia, especially in older adults. It's one of the most likely culprits given its strong association with SIADH (Syndrome of Inappropriate Antidiuretic Hormone Secretion). Omeprazole: Proton pump inhibitors have been associated with hyponatremia in some cases. Amlodipine: While less common, calcium channel blockers can occasionally contribute to hyponatremia.  SIADH: This could be related to the patient's  epilepsy or induced by medications (particularly oxcarbazepine).  Diuretic effect: The combination of calcium-magnesium supplements and potassium supplements might have a mild diuretic effect, potentially contributing to electrolyte imbalances.  Hormonal imbalances: While not explicitly listed in her problems, the patient's age and history of multiple endocrine-related issues (e.g., osteoarthritis, dyslipidemia) suggest that hormonal factors could play a role.   Gastrointestinal issues: The patient's history of constipation, diverticulosis, and colonoscopy abnormalities could potentially affect fluid and electrolyte absorption.  Stress and lifestyle factors: Chronic stress can affect cortisol levels and potentially influence fluid and electrolyte balance.  Dietary factors: Low sodium intake or excessive water intake could contribute, especially if combined with other factors.  Chronic conditions: Essential hypertension and its management might influence sodium levels.  Given this information, the most likely explanations are: Oxcarbazepine-induced hyponatremia SIADH (potentially related to epilepsy or medication) Multifactorial hyponatremia due to a combination of medications, supplements, and underlying health conditions  To address this, consider: We reviewing and potentially adjusting the oxcarbazepine dosage but she has been on 300 twice daily long term so will wait for repeat labs Evaluating for SIADH- extensive labwork ordered Assessing fluid intake and dietary sodium she says she doesn't limit these, but feels dehydrated Checking for any recent changes in medication or supplements - she denies any Conducting a thorough endocrine workup to rule out other hormonal imbalances - will check adrenals and thyroid hormones Bloating They present persistent bloating and a previous positive C. diff screen without current diarrhea, suggesting possible small intestinal bacterial overgrowth  (SIBO) or C. diff overgrowth following recent antibiotic use. A two-week trial of probiotics will be initiated, along with the consideration of over-the-counter simethicone (Gas-X) for bloating. If symptoms persist, a follow-up appointment will be scheduled, and a low carbohydrate diet may be  considered to counter bacterial overgrowth. 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 Symptoms will continue to be monitored. Seizure disorder (HCC) They have been on Trileptal 300mg  twice daily since 2007, which may contribute to low sodium levels. A medication change will be considered if sodium levels do not improve, pending lab results.    Diagnoses and all orders for this visit: Hyponatremia -     Amb ref to Medical Nutrition Therapy-MNT -     Lipid panel -     TSH -     Comprehensive metabolic panel -     CBC with Differential/Platelet -     Osmolality, urine -     Creatinine, Urine -     Osmolality -     TSH + free T4 -     Cortisol -     ACTH -     C-reactive protein -     Sedimentation rate -     B12 and Folate Panel -     Ferritin -     Celiac Disease Comprehensive Panel with Reflexes Bloating  Recommended follow-up: No follow-ups on file. Future Appointments  Date Time Provider Department Center  07/25/2023 10:15 AM [facility] ANNUAL WELLNESS VISIT 1 [facility]  07/30/2023  2:00 PM Van Clines, MD [facility] None        Subjective   82 y.o. female who has Insomnia; Localization-related idiopathic epilepsy and epileptic syndromes with seizures of localized onset, not intractable, without status epilepticus (HCC); Dermatochalasis of both upper eyelids; Allergic rhinitis; History of cold sores; Dyslipidemia; Tubular adenoma of colon; History of colonic polyps; Abnormal colonoscopy; Essential hypertension; Constipation; Diverticulosis; TMJ pain dysfunction syndrome; Osteoarthritis; Seborrheic  dermatitis; Stress; and Hyponatremia on their problem list. Her reasons/main concerns/chief complaints for today's office visit are One week follow-up ------------------------------------------------------------------------------------------------------------------------ AI-Extracted: Discussed the use of AI scribe software for clinical note transcription with the patient, who gave verbal consent to proceed.  History of Present Illness   The patient, with a history of seizures managed with Trileptal, presented with persistent feelings of malaise and bloating. The patient reported a significant improvement in symptoms since the last visit but still felt unwell. The patient also reported a history of COVID-19 infection in February, with residual cough symptoms.  The patient expressed concerns about recent lab results, specifically a low sodium level of 124, which was significantly below the normal range. The patient was unsure of the cause of this abnormality but was aware of the potential link to their ongoing malaise.  The patient also reported a history of gastrointestinal issues, including an episode of nocturnal diarrhea and bloating. The patient had been tested for C. diff due to these symptoms, but the results were inconclusive. The patient reported an improvement in these symptoms, with no recent episodes of diarrhea.  The patient also expressed a desire for nutritional advice, indicating uncertainty about dietary needs due to their ongoing health issues. The patient reported a regular intake of yogurt and was interested in the potential benefits of probiotics.  The patient also reported a history of antibiotic use for various reasons, including cuts on the leg and elsewhere. The patient was aware of the potential impact of antibiotics on gut health and was interested in strategies to mitigate these effects.  The patient reported regular exercise, attending the gym twice a week, and  participating in an art class once a week. Despite these  activities, the patient reported persistent feelings of malaise and a lack of energy.  The patient also reported a history of seizures, managed with Trileptal (oxcarbazepine) 300mg  twice daily. The patient reported that this medication regimen had been stable for several years, with no recent changes.  In summary, the patient presented with persistent malaise, bloating, and concerns about low sodium levels. The patient has a history of seizures, gastrointestinal issues, and recent COVID-19 infection. The patient is actively seeking strategies to improve their health, including dietary changes and potential probiotic use.     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  Medication(s) review to explain hyponatremia Current Outpatient Medications on File Prior to Visit  Medication Sig   amLODipine (NORVASC) 5 MG tablet Take 1 tablet (5 mg total) by mouth daily.   Ascorbic Acid (VITAMIN C) 1000 MG tablet Take 1,000 mg by mouth daily.   b complex vitamins capsule Take 1 capsule by mouth daily.   CALCIUM-MAGNESIUM PO Take 1 tablet by mouth daily. 1000 mg / 500 mg   Cholecalciferol (VITAMIN D3) 50 MCG (2000 UT) capsule Take 2,000 Units by mouth daily.   hydrocortisone 2.5 % cream Apply topically 2  (two) times daily as needed (Rash). (Patient taking differently: Apply 1 application  topically 2 (two) times daily as needed (Rosacea).)   hydrOXYzine (ATARAX) 10 MG tablet TAKE 1 TO 2 TABLETS (10-20 MG TOTAL) BY MOUTH AT BEDTIME   ketoconazole (NIZORAL) 2 % shampoo Use 2-3 times weekly as needed   Lysine 500 MG TABS Take 500 mg by mouth daily.   MELATONIN PO Take 1 capsule by mouth at bedtime.   Multiple Vitamin (MULTIVITAMIN WITH MINERALS) TABS tablet Take 1 tablet by mouth daily.   Omega-3 Fatty Acids (FISH OIL PO) Take 630 mg by mouth daily.   omeprazole (PRILOSEC) 40 MG capsule Take 40 mg by mouth daily.   Oxcarbazepine (TRILEPTAL) 300 MG tablet TAKE 1 TABLET BY MOUTH TWICE A DAY   Potassium 99 MG TABS Take 99 mg by mouth daily.   Probiotic Product (PROBIOTIC DAILY) CAPS Take 1 capsule by mouth daily. Woman   triamcinolone cream (KENALOG) 0.1 % Apply 1 Application topically daily.   TURMERIC PO Take 600 mg by mouth daily.   valACYclovir (VALTREX) 500 MG tablet TAKE 1 TABLET BY MOUTH DAILY AS NEEDED (COLD SORES).   No current facility-administered medications on file prior to visit.  There are no discontinued medications.   Objective   Physical Exam  BP 122/65 (BP Location: Left Arm, Patient Position: Sitting)   Pulse 79   Temp (!) 97.2 F (36.2 C) (Temporal)   Ht 5\' 2"  (1.575 m)   Wt 140 lb 6.4 oz (63.7 kg)   SpO2 96%   BMI 25.68 kg/m  Wt Readings from Last 10 Encounters:  09/06/22 140 lb 6.4 oz (63.7 kg)  08/28/22 140 lb (63.5 kg)  08/22/22 136 lb 12.8 oz (62.1 kg)  07/27/22 138 lb (62.6 kg)  07/23/22 140 lb (63.5 kg)  07/20/22 139 lb 6.4 oz (63.2 kg)  07/19/22 139 lb (63 kg)  07/09/22 139 lb 8 oz (63.3 kg)  05/18/22 134 lb 3.2 oz (60.9 kg)  05/08/22 135 lb 9.6 oz (61.5 kg)   Vital signs reviewed.  Nursing notes reviewed. Weight trend reviewed. Abnormalities and Problem-Specific physical exam findings:  cognitive processing is unchanged from prior visit. No slurring.   Mild slowing possibly.  General Appearance:  No acute distress appreciable.   Well-groomed, healthy-appearing female.  Well proportioned with no abnormal fat distribution.  Good muscle tone. Pulmonary:  Normal work of breathing at rest, no respiratory distress apparent. SpO2: 96 %  Musculoskeletal: All extremities are intact.  Neurological:  Awake, alert, oriented, and engaged.  No obvious focal neurological deficits or cognitive impairments.  Sensorium seems unclouded.   Speech is clear and coherent with logical content. Psychiatric:  Appropriate mood, pleasant and cooperative demeanor, thoughtful and engaged during the exam  Results   LABS C. diff screen: positive Sodium: 124 (08/23/2022)        No results found for any visits on 09/06/22.  Office Visit on 08/22/2022  Component Date Value   Sodium 08/22/2022 124 (L)    Potassium 08/22/2022 4.3    Chloride 08/22/2022 89 (L)    CO2 08/22/2022 27    Glucose, Bld 08/22/2022 90    BUN 08/22/2022 13    Creatinine, Ser 08/22/2022 0.57    Total Bilirubin 08/22/2022 0.5    Alkaline Phosphatase 08/22/2022 81    AST 08/22/2022 26    ALT 08/22/2022 21    Total Protein 08/22/2022 8.1    Albumin 08/22/2022 4.6    GFR 08/22/2022 83.65    Calcium 08/22/2022 9.8    WBC 08/22/2022 7.5    RBC 08/22/2022 4.63    Hemoglobin 08/22/2022 14.9    HCT 08/22/2022 43.1    MCV 08/22/2022 93.1    MCHC 08/22/2022 34.5    RDW 08/22/2022 12.7    Platelets 08/22/2022 250.0    Neutrophils Relative % 08/22/2022 57.5    Lymphocytes Relative 08/22/2022 32.2    Monocytes Relative 08/22/2022 9.6    Eosinophils Relative 08/22/2022 0.1    Basophils Relative 08/22/2022 0.6    Neutro Abs 08/22/2022 4.3    Lymphs Abs 08/22/2022 2.4    Monocytes Absolute 08/22/2022 0.7    Eosinophils Absolute 08/22/2022 0.0    Basophils Absolute 08/22/2022 0.0    Color, Urine 08/22/2022 YELLOW    APPearance 08/22/2022 CLEAR    Specific Gravity, Urine 08/22/2022 1.020    pH  08/22/2022 6.0    Total Protein, Urine 08/22/2022 NEGATIVE    Urine Glucose 08/22/2022 NEGATIVE    Ketones, ur 08/22/2022 TRACE (A)    Bilirubin Urine 08/22/2022 NEGATIVE    Hgb urine dipstick 08/22/2022 NEGATIVE    Urobilinogen, UA 08/22/2022 0.2    Leukocytes,Ua 08/22/2022 NEGATIVE    Nitrite 08/22/2022 NEGATIVE    WBC, UA 08/22/2022 0-2/hpf    RBC / HPF 08/22/2022 0-2/hpf    Mucus, UA 08/22/2022 Presence of (A)    Renal Epithel, UA 08/22/2022 Rare(0-4/hpf) (A)    Sodium, Feces 08/24/2022 CANCELED    Osmolality, Feces 08/24/2022 CANCELED    MICRO NUMBER: 08/24/2022 [id number]    SPECIMEN QUALITY: 08/24/2022 Adequate    Source 08/24/2022 STOOL    STATUS: 08/24/2022 FINAL    CONCENTRATE RESULT: 08/24/2022 No ova or parasites seen    TRICHROME RESULT: 08/24/2022 No ova or parasites seen    COMMENT: 08/24/2022  Value:Routine Ova and Parasite exam may not detect some parasites that occasionally cause diarrheal illness. Cryptosporidium Antigen and/or Cyclospora Isospora Exam may be ordered to detect these parasites.  For additional information, please refer to  https://education.[facility].com/faq/FAQ203 (This link is being provided for informational/ educational purposes only.)    Toxigenic C. Difficile b* 08/24/2022 Positive (A)    Calprotectin, Fecal 08/24/2022 137 (H)    MICRO NUMBER: 08/24/2022 [id number]    SPECIMEN QUALITY: 08/24/2022 Adequate    Source 08/24/2022 STOOL    STATUS: 08/24/2022 FINAL    Fecal Lactoferrin 08/24/2022 Negative    COMMENT: 08/24/2022                     Value:Lactoferrin in the stool is a marker for fecal leukocytes and is a non-specific indicator of intestinal inflammation that may be detected in patients with acute infectious colitis or inflammatory bowel disease. The diagnosis of an acute infectious  process or active IBD cannot be established solely on the basis of a positive result. This test may not be appropriate for  immunocompromised persons. In addition, this test is not FDA cleared for patients with a history of HIV and/or Hepatitis B and C,  patients with a history of infectious diarrhea (within 6 months), and patients having had a colostomy and/or ileostomy within 1 month.    Lipase 08/22/2022 29.0   Office Visit on 07/23/2022  Component Date Value   WBC 07/23/2022 6.6    RBC 07/23/2022 4.01    Platelets 07/23/2022 225.0    Hemoglobin 07/23/2022 13.0    HCT 07/23/2022 38.1    MCV 07/23/2022 94.8    MCHC 07/23/2022 34.1    RDW 07/23/2022 13.4   Office Visit on 03/09/2022  Component Date Value   Color, UA 03/09/2022 yellow    Clarity, UA 03/09/2022 clear    Glucose, UA 03/09/2022 Negative    Bilirubin, UA 03/09/2022 neg    Ketones, UA 03/09/2022 neg    Spec Grav, UA 03/09/2022 1.025    Blood, UA 03/09/2022 neg    pH, UA 03/09/2022 5.0    Protein, UA 03/09/2022 Positive (A)    Urobilinogen, UA 03/09/2022 negative (A)    Nitrite, UA 03/09/2022 negative    Leukocytes, UA 03/09/2022 Negative    MICRO NUMBER: 03/09/2022 [id number]    SPECIMEN QUALITY: 03/09/2022 Adequate    Sample Source 03/09/2022 URINE    STATUS: 03/09/2022 FINAL    ISOLATE 1: 03/09/2022 ESBL Escherichia coli (A)   Office Visit on 03/06/2022  Component Date Value   SARS Coronavirus 2 Ag 03/06/2022 Negative   Office Visit on 01/24/2022  Component Date Value   Sodium 01/24/2022 135    Potassium 01/24/2022 4.3    Chloride 01/24/2022 98    CO2 01/24/2022 32    Glucose, Bld 01/24/2022 79    BUN 01/24/2022 16    Creatinine, Ser 01/24/2022 0.52    Total Bilirubin 01/24/2022 0.4    Alkaline Phosphatase 01/24/2022 58    AST 01/24/2022 23    ALT 01/24/2022 21    Total Protein 01/24/2022 7.4    Albumin 01/24/2022 4.3    GFR 01/24/2022 85.87    Calcium 01/24/2022 9.4    WBC 01/24/2022 6.0    RBC 01/24/2022 4.38    Hemoglobin 01/24/2022 14.0    HCT 01/24/2022 40.9    MCV 01/24/2022 93.4    MCHC 01/24/2022 34.3    RDW  01/24/2022 13.3    Platelets 01/24/2022 260.0    Neutrophils  Relative % 01/24/2022 47.4    Lymphocytes Relative 01/24/2022 39.4    Monocytes Relative 01/24/2022 12.4 (H)    Eosinophils Relative 01/24/2022 0.1    Basophils Relative 01/24/2022 0.7    Neutro Abs 01/24/2022 2.9    Lymphs Abs 01/24/2022 2.4    Monocytes Absolute 01/24/2022 0.7    Eosinophils Absolute 01/24/2022 0.0    Basophils Absolute 01/24/2022 0.0    No image results found.   DG Chest 2 View  Result Date: 08/30/2022 CLINICAL DATA:  chronic cough since COVID EXAM: CHEST - 2 VIEW COMPARISON:  None Available. FINDINGS: Cardiac silhouette is unremarkable. No pneumothorax or pleural effusion. The lungs are clear. Aorta is ectatic and calcified. Post vertebroplasty changes identified at the T12 level. Osseous structures are osteopenic. IMPRESSION: No acute cardiopulmonary process. Electronically Signed   By: Layla Maw M.D.   On: 08/30/2022 16:13   DG Abd 2 Views  Result Date: 08/30/2022 CLINICAL DATA:  LLQ pain for 10 days. EXAM: ABDOMEN - 2 VIEW COMPARISON:  None Available. FINDINGS: The bowel gas pattern is normal. There is no evidence of free air. No radio-opaque calculi or other significant radiographic abnormality is seen. Vertebroplasty changes at T12. There are cholecystectomy clips. IMPRESSION: Negative. Electronically Signed   By: Layla Maw M.D.   On: 08/30/2022 16:12    DG Chest 2 View  Result Date: 08/30/2022 CLINICAL DATA:  chronic cough since COVID EXAM: CHEST - 2 VIEW COMPARISON:  None Available. FINDINGS: Cardiac silhouette is unremarkable. No pneumothorax or pleural effusion. The lungs are clear. Aorta is ectatic and calcified. Post vertebroplasty changes identified at the T12 level. Osseous structures are osteopenic. IMPRESSION: No acute cardiopulmonary process. Electronically Signed   By: Layla Maw M.D.   On: 08/30/2022 16:13   DG Abd 2 Views  Result Date: 08/30/2022 CLINICAL DATA:  LLQ  pain for 10 days. EXAM: ABDOMEN - 2 VIEW COMPARISON:  None Available. FINDINGS: The bowel gas pattern is normal. There is no evidence of free air. No radio-opaque calculi or other significant radiographic abnormality is seen. Vertebroplasty changes at T12. There are cholecystectomy clips. IMPRESSION: Negative. Electronically Signed   By: Layla Maw M.D.   On: 08/30/2022 16:12       Additional Info: This encounter employed real-time, collaborative documentation. The patient actively reviewed and updated their medical record on a shared screen, ensuring transparency and facilitating joint problem-solving for the problem list, overview, and plan. This approach promotes accurate, informed care. The treatment plan was discussed and reviewed in detail, including medication safety, potential side effects, and all patient questions. We confirmed understanding and comfort with the plan. Follow-up instructions were established, including contacting the office for any concerns, returning if symptoms worsen, persist, or new symptoms develop, and precautions for potential emergency department visits.

## 2022-09-07 LAB — TSH: TSH: 1.61 u[IU]/mL (ref 0.35–5.50)

## 2022-09-07 LAB — B12 AND FOLATE PANEL
Folate: >24.2 ng/mL (ref >5.9)
Vitamin B-12: 658 pg/mL (ref 211–911)

## 2022-09-07 LAB — CORTISOL: Cortisol, Plasma: 13.9 ug/dL

## 2022-09-07 LAB — FERRITIN: Ferritin: 35 ng/mL (ref 10.0–291.0)

## 2022-09-09 LAB — CELIAC DISEASE COMPREHENSIVE PANEL WITH REFLEXES
(tTG) Ab, IgA: <1 U/mL
Immunoglobulin A: 156 mg/dL (ref 70–320)

## 2022-09-09 LAB — ACTH: C206 ACTH: 39 pg/mL (ref 6–50)

## 2022-09-09 LAB — TSH+FREE T4: TSH W/REFLEX TO FT4: 1.68 m[IU]/L (ref 0.40–4.50)

## 2022-09-09 LAB — OSMOLALITY: Osmolality: 273 mosm/kg — ABNORMAL LOW (ref 278–305)

## 2022-09-09 LAB — CREATININE, URINE, RANDOM: Creatinine, Urine: 136 mg/dL (ref 20–275)

## 2022-09-09 LAB — OSMOLALITY, URINE: Osmolality, Ur: 698 mosm/kg (ref 50–1200)

## 2022-09-10 ENCOUNTER — Encounter: Payer: Self-pay | Admitting: Internal Medicine

## 2022-09-11 ENCOUNTER — Telehealth: Payer: Self-pay | Admitting: Family Medicine

## 2022-09-11 NOTE — Progress Notes (Signed)
Notify: Good improvement in sodium 124->128 since diarrhea stopped Needs to follow up with Primary Care Provider (PCP) about the other labs and recheck sodium again based on Primary Care Provider (PCP) recommendations probably in 4-12 weeks.

## 2022-09-11 NOTE — Telephone Encounter (Signed)
Pt would like a call back with lab results and she is still not feeling well. Please advise.

## 2022-09-12 NOTE — Telephone Encounter (Signed)
Notify: Good improvement in sodium 124->128 since diarrhea stopped Needs to follow up with Primary Care Provider (PCP) about the other labs and recheck sodium again based on Primary Care Provider (PCP) recommendations probably in 4-12 weeks. Lab results given  Schedule appt with PCP

## 2022-09-13 ENCOUNTER — Ambulatory Visit: Payer: Medicare Other | Admitting: Family Medicine

## 2022-09-13 NOTE — Progress Notes (Deleted)
   Joan Mclaughlin is a 82 y.o. female who presents today for an office visit.  Assessment/Plan:  New/Acute Problems: ***  Chronic Problems Addressed Today: No problem-specific Assessment & Plan notes found for this encounter.     Subjective:  HPI:  See Assessment / plan for status of chronic condtions. She is here today for follow up.  She was seen by different provider here about 3 weeks ago for nausea, decreased appetite, and diarrhea.  Had extensive workup at that time that was notable for C. difficile.  She was also incidentally found to have hyponatremia with a sodium of 124.  She was referred to GI however her diarrhea had subsided by that point and she was never started on treatment for this.  She did follow-up again a week or so after this.  Sodium was rechecked and it had improved to 128.       Objective:  Physical Exam: There were no vitals taken for this visit.  Gen: No acute distress, resting comfortably*** CV: Regular rate and rhythm with no murmurs appreciated Pulm: Normal work of breathing, clear to auscultation bilaterally with no crackles, wheezes, or rhonchi Neuro: Grossly normal, moves all extremities Psych: Normal affect and thought content      Hulon Ferron M. [name], MD 09/13/2022 1:19 PM

## 2022-09-17 ENCOUNTER — Ambulatory Visit (INDEPENDENT_AMBULATORY_CARE_PROVIDER_SITE_OTHER): Payer: Medicare Other | Admitting: Family Medicine

## 2022-09-17 ENCOUNTER — Encounter: Payer: Self-pay | Admitting: Family Medicine

## 2022-09-17 VITALS — BP 153/75 | HR 76 | Temp 98.2°F | Ht 62.0 in | Wt 137.2 lb

## 2022-09-17 DIAGNOSIS — G40009 Localization-related (focal) (partial) idiopathic epilepsy and epileptic syndromes with seizures of localized onset, not intractable, without status epilepticus: Secondary | ICD-10-CM

## 2022-09-17 DIAGNOSIS — I159 Secondary hypertension, unspecified: Secondary | ICD-10-CM | POA: Diagnosis not present

## 2022-09-17 DIAGNOSIS — E871 Hypo-osmolality and hyponatremia: Secondary | ICD-10-CM

## 2022-09-17 DIAGNOSIS — R197 Diarrhea, unspecified: Secondary | ICD-10-CM

## 2022-09-17 DIAGNOSIS — R110 Nausea: Secondary | ICD-10-CM | POA: Diagnosis not present

## 2022-09-17 MED ORDER — ONDANSETRON HCL 4 MG PO TABS: 4.0000 mg | ORAL_TABLET | Freq: Three times a day (TID) | ORAL | 0 refills | Status: DC | PRN

## 2022-09-17 NOTE — Progress Notes (Signed)
Joan Mclaughlin is a 82 y.o. female who presents today for an office visit.  Assessment/Plan:  New/Acute Problems: Hyponatremia Had improved to 128 on most recent recheck.  This is most likely due to GI also is related to her illness from several weeks ago.  We discussed importance of balanced diet and adequate sodium intake.  Will be starting Zofran for her mild nausea which should hopefully help with improving both of these as well.  We are rechecking her sodium level today.  If she is back to baseline then we will not need to do any further testing regarding this however if sodium is not recovering as expected we will have to look for other potential etiologies including trial off of her Trileptal.   Diarrhea Highly had 1 mild episode of diarrhea over the last several weeks.  Had lengthy discussion with patient today regarding her C. difficile test.  Per GI they are not treating with antibiotics at this point as her diarrhea has resolved.  She will follow-up with GI if she has recurrence.  She does have an at home test kit for C. difficile toxin if she has recurrence.  Nausea May be related to her above GI illness.  She is having some trouble with staying well-hydrated due to this and does admit that it is hard for her to take the fluids and food that she knows that she needs to take.  Will send in a short supply of Zofran.  Hopefully this will help her with being able to stay hydrated and intaking adequate amount of sodium.  Chronic Problems Addressed Today: Hypertension Mildly elevated today.  Typically well-controlled.  She will monitor at home and let us know if persistently elevated.  Localization-related idiopathic epilepsy and epileptic syndromes with seizures of localized onset, not intractable, without status epilepticus [facility]) Following with neurology for this.  She is currently on Trileptal 300 mg twice daily.  Discussed that this potentially could be causing some of her  hyponatremia issues however she has been on this for many years and never had any issues.  If sodium does not improve back to baseline despite resolution of her diarrhea would consider trial off of Trileptal.  Will need to discuss this with neurology in that case.     Subjective:  HPI:  See A/P for status of chronic conditions.  Patient is here today for follow-up for hyponatremia.  She was seen here by different provider about 3 weeks ago for bloating and diarrhea.  As a part of the initial workup she was found to be positive for C. difficile by PCR.  Also incidentally found to be hyponatremic.  She was subsequently referred to GI.   It was thought that symptoms may have been a chronic colonizer and potentially not causing any of her issues with diarrhea.  She did have her sodium rechecked again a week and a half ago and it had improved from 124 to 128.  Over the last week or so she has been doing okay.  Still has some nausea and general not feeling well.  No vomiting.  No weakness.  No numbness.  She had 1 brief recurrence of diarrhea but none since.  No fevers or chills.  She has had occasional cramping in her abdomen.  No melena.  No hematochezia.       Objective:  Physical Exam: BP (!) 153/75   Pulse 76   Temp 98.2 F (36.8 C) (Temporal)   Ht 5\' 2"  (1.575  m)   Wt 137 lb 3.2 oz (62.2 kg)   SpO2 97%   BMI 25.09 kg/m   Wt Readings from Last 3 Encounters:  09/17/22 137 lb 3.2 oz (62.2 kg)  09/06/22 140 lb 6.4 oz (63.7 kg)  08/28/22 140 lb (63.5 kg)    Gen: No acute distress, resting comfortably CV: Regular rate and rhythm with no murmurs appreciated Pulm: Normal work of breathing, clear to auscultation bilaterally with no crackles, wheezes, or rhonchi Neuro: Grossly normal, moves all extremities Psych: Normal affect and thought content  Time Spent: 45 minutes of total time was spent on the date of the encounter performing the following actions: chart review prior to seeing the  patient including recent visits with specialists and other providers, obtaining history, performing a medically necessary exam, counseling on the treatment plan, placing orders, and documenting in our EHR.        Katina Degree. [name], MD 09/17/2022 3:22 PM

## 2022-09-17 NOTE — Assessment & Plan Note (Signed)
Mildly elevated today.  Typically well-controlled.  She will monitor at home and let us know if persistently elevated.

## 2022-09-17 NOTE — Patient Instructions (Addendum)
It was very nice to see you today!  We will check blood work today.  Please make sure that you are getting plenty of sodium in your diet.  Please return the stool sample to GI if you have recurrence of your diarrhea.  Please take the Zofran to help with the nausea.  Hopefully this will help you with your appetite and ability to take liquids as well.  Return if symptoms worsen or fail to improve.   Take care, Dr [name]  PLEASE NOTE:  check blood work today to make sure your sodium numbers are improving.  Please make sure that you are getting plenty of fluids and If you had any lab tests, please let us know if you have not heard back within a few days. You may see your results on mychart before we have a chance to review them but we will give you a call once they are reviewed by [place].   If we ordered any referrals today, please let us know if you have not heard from their office within the next week.   If you had any urgent prescriptions sent in today, please check with the pharmacy within an hour of our visit to make sure the prescription was transmitted appropriately.   Please try these tips to maintain a healthy lifestyle:  Eat at least 3 REAL meals and 1-2 snacks per day.  Aim for no more than 5 hours between eating.  If you eat breakfast, please do so within one hour of getting up.   Each meal should contain half fruits/vegetables, one quarter protein, and one quarter carbs (no bigger than a computer mouse)  Cut down on sweet beverages. This includes juice, soda, and sweet tea.   Drink at least 1 glass of water with each meal and aim for at least 8 glasses per day  Exercise at least 150 minutes every week.

## 2022-09-17 NOTE — Assessment & Plan Note (Addendum)
Following with neurology for this.  She is currently on Trileptal 300 mg twice daily.  Discussed that this potentially could be causing some of her hyponatremia issues however she has been on this for many years and never had any issues.  If sodium does not improve back to baseline despite resolution of her diarrhea would consider trial off of Trileptal.  Will need to discuss this with neurology in that case.

## 2022-09-18 LAB — CBC
HCT: 43.6 % (ref 36.0–46.0)
Hemoglobin: 14.6 g/dL (ref 12.0–15.0)
MCHC: 33.6 g/dL (ref 30.0–36.0)
MCV: 96.3 fl (ref 78.0–100.0)
Platelets: 279 10*3/uL (ref 150.0–400.0)
RBC: 4.53 Mil/uL (ref 3.87–5.11)
RDW: 13.6 % (ref 11.5–15.5)
WBC: 7.3 10*3/uL (ref 4.0–10.5)

## 2022-09-18 LAB — COMPREHENSIVE METABOLIC PANEL
ALT: 18 U/L (ref 0–35)
AST: 21 U/L (ref 0–37)
Albumin: 4.3 g/dL (ref 3.5–5.2)
Alkaline Phosphatase: 79 U/L (ref 39–117)
BUN: 18 mg/dL (ref 6–23)
CO2: 29 meq/L (ref 19–32)
Calcium: 10 mg/dL (ref 8.4–10.5)
Chloride: 96 meq/L (ref 96–112)
Creatinine, Ser: 0.61 mg/dL (ref 0.40–1.20)
GFR: 82.25 mL/min (ref >60.00)
Glucose, Bld: 83 mg/dL (ref 70–99)
Potassium: 4.3 meq/L (ref 3.5–5.1)
Sodium: 132 meq/L — ABNORMAL LOW (ref 135–145)
Total Bilirubin: 0.5 mg/dL (ref 0.2–1.2)
Total Protein: 7.9 g/dL (ref 6.0–8.3)

## 2022-09-18 NOTE — Progress Notes (Signed)
Her sodium is improving though still not quite back to baseline.  As we discussed at her office visit her low sodium is probably due to the gastroenterology illness that she had.  She should make sure that she is getting a regular diet and plenty of water and fluids.  I would like for her to come back in a couple of weeks to recheck to make sure that is back to normal.  Please place future order for bMET.  She should let us know if her symptoms do not continue to improve over the next few weeks.

## 2022-09-20 ENCOUNTER — Other Ambulatory Visit: Payer: Self-pay | Admitting: *Deleted

## 2022-09-20 DIAGNOSIS — E870 Hyperosmolality and hypernatremia: Secondary | ICD-10-CM

## 2022-09-25 ENCOUNTER — Telehealth: Payer: Self-pay | Admitting: Family Medicine

## 2022-09-25 NOTE — Telephone Encounter (Signed)
Pt would like a copy of her lab results printed out.

## 2022-09-26 NOTE — Telephone Encounter (Signed)
Patient notified copy at front office

## 2022-09-26 NOTE — Telephone Encounter (Signed)
Labs printed, placed in front office to be pickup

## 2022-09-27 NOTE — Progress Notes (Signed)
Patient ID: Joan Mclaughlin, female    DOB: [birth year], 82 y.o.   MRN: [id number]  Chief Complaint  Patient presents with   Urinary Tract Infection    Pt c/o of urinary urgency for the past month.    *Discussed the use of AI scribe software for clinical note transcription with the patient, who gave verbal consent to proceed.  History of Present Illness   The patient, with a history of seizures and hypertension, presents with increased urinary urgency and frequency. She reports feeling the need to urinate often, but with little output. She also experiences occasional leakage, particularly in the mornings. This has been an ongoing issue but has recently worsened. The patient also mentions a history of urinary tract infections and diverticulitis, for which she has previously been treated with antibiotics, but nothing recently. She is currently taking a special coconut yogurt and a probiotic pill to restore good bacteria in her system.         Assessment & Plan:     Urinary Incontinence- Increased urgency and frequency with some leakage. No signs of infection in urine sample. Discussed potential overactive bladder and the benefits of Kegel exercises and pelvic floor exercises. -Start Kegel and pelvic floor exercises. -Consider Myrbetriq if symptoms worsen, pending insurance coverage and medication interaction check with current seizure and blood pressure medications.  General Health Maintenance- Pt inquiring about [employer] training program. Reviewed program & discussed benefits of maintaining current weight and potential benefits of a physical activity program. -Consider physical activity program at the [facility] pt will contact PCP or myself if interested.     Subjective:    Outpatient Medications Prior to Visit  Medication Sig Dispense Refill   amLODipine (NORVASC) 5 MG tablet Take 1 tablet (5 mg total) by mouth daily. 90 tablet 1   Ascorbic Acid (VITAMIN C) 1000 MG tablet Take 1,000 mg by  mouth daily.     b complex vitamins capsule Take 1 capsule by mouth daily.     CALCIUM-MAGNESIUM PO Take 1 tablet by mouth daily. 1000 mg / 500 mg     Cholecalciferol (VITAMIN D3) 50 MCG (2000 UT) capsule Take 2,000 Units by mouth daily.     hydrocortisone 2.5 % cream Apply topically 2 (two) times daily as needed (Rash). (Patient taking differently: Apply 1 application  topically 2 (two) times daily as needed (Rosacea).) 30 g 11   hydrOXYzine (ATARAX) 10 MG tablet TAKE 1 TO 2 TABLETS (10-20 MG TOTAL) BY MOUTH AT BEDTIME 180 tablet 2   ketoconazole (NIZORAL) 2 % shampoo Use 2-3 times weekly as needed 120 mL 5   Lysine 500 MG TABS Take 500 mg by mouth daily.     MELATONIN PO Take 1 capsule by mouth at bedtime.     Multiple Vitamin (MULTIVITAMIN WITH MINERALS) TABS tablet Take 1 tablet by mouth daily.     Omega-3 Fatty Acids (FISH OIL PO) Take 630 mg by mouth daily.     omeprazole (PRILOSEC) 40 MG capsule Take 40 mg by mouth daily.     ondansetron (ZOFRAN) 4 MG tablet Take 1 tablet (4 mg total) by mouth every 8 (eight) hours as needed for nausea or vomiting. 20 tablet 0   Oxcarbazepine (TRILEPTAL) 300 MG tablet TAKE 1 TABLET BY MOUTH TWICE A DAY 180 tablet 3   Potassium 99 MG TABS Take 99 mg by mouth daily.     Probiotic Product (PROBIOTIC DAILY) CAPS Take 1 capsule by mouth daily. Woman  triamcinolone cream (KENALOG) 0.1 % Apply 1 Application topically daily. 30 g 5   TURMERIC PO Take 600 mg by mouth daily.     valACYclovir (VALTREX) 500 MG tablet TAKE 1 TABLET BY MOUTH DAILY AS NEEDED (COLD SORES). 90 tablet 1   No facility-administered medications prior to visit.   Past Medical History:  Diagnosis Date   Alcoholism (HCC)    sober for 47 years-noted 09/16/2018   Allergy    Anxiety    Cancer (HCC)    skin cancer   Cholecystitis    Complication of anesthesia    Difficulty sleeping    takes med to sleep   Diverticulitis    Family history of adverse reaction to anesthesia    SIster -  N/V   Gallstones    Ganglion cyst 12/11/2016   GERD (gastroesophageal reflux disease)    "RELATED TO GALLBLADDER"   Heart murmur    "ONLY DETECTED WHEN LYING DOWN"  - 10/16/2018- Not concerned with   History of Lyme disease 09/19/2016   Hyperlipidemia    Hypertension    Lyme disease    Pt states she has had Lyme Disease twice.    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/2018   RIGHT FOREARM    Squamous cell carcinoma of skin 09/30/2019   in situ-left lower leg-anterior   Trimalleolar fracture    left   Tubular adenoma of colon 06/27/2018   Past Surgical History:  Procedure Laterality Date   CHOLECYSTECTOMY N/A 12/23/2014   Procedure: LAPAROSCOPIC CHOLECYSTECTOMY WITH INTRAOPERATIVE CHOLANGIOGRAM;  Surgeon: Gaynelle Adu, MD;  Location: [facility] ORS;  Service: General;  Laterality: N/A;   COLONOSCOPY     "3 times trying to get large polyp since August 2020"   COLONOSCOPY WITH PROPOFOL N/A 10/20/2018   Procedure: COLONOSCOPY WITH PROPOFOL;  Surgeon: Lemar Lofty., MD;  Location: [facility] ENDOSCOPY;  Service: Gastroenterology;  Laterality: N/A;   COLONOSCOPY WITH PROPOFOL N/A 11/02/2019   Procedure: COLONOSCOPY WITH PROPOFOL;  Surgeon: Meridee Score Netty Starring., MD;  Location: [facility] ENDOSCOPY;  Service: Gastroenterology;  Laterality: N/A;   COLONOSCOPY WITH PROPOFOL N/A 08/31/2020   Procedure: COLONOSCOPY WITH PROPOFOL;  Surgeon: Meridee Score Netty Starring., MD;  Location: [facility] ENDOSCOPY;  Service: Gastroenterology;  Laterality: N/A;   ENDOSCOPIC MUCOSAL RESECTION N/A 10/20/2018   Procedure: ENDOSCOPIC MUCOSAL RESECTION;  Surgeon: Meridee Score Netty Starring., MD;  Location: [facility] ENDOSCOPY;  Service: Gastroenterology;  Laterality: N/A;   ENDOSCOPIC MUCOSAL RESECTION  11/02/2019    Procedure: ENDOSCOPIC MUCOSAL RESECTION;  Surgeon: Meridee Score Netty Starring., MD;  Location: [facility] ENDOSCOPY;  Service: Gastroenterology;;   HEMOSTASIS CLIP PLACEMENT  10/20/2018   Procedure: HEMOSTASIS CLIP PLACEMENT;  Surgeon: Lemar Lofty., MD;  Location: [facility] ENDOSCOPY;  Service: Gastroenterology;;   HEMOSTASIS CLIP PLACEMENT  11/02/2019   Procedure: HEMOSTASIS CLIP PLACEMENT;  Surgeon: Lemar Lofty., MD;  Location: [facility] ENDOSCOPY;  Service: Gastroenterology;;   ORIF ANKLE FRACTURE Left 02/19/2019   Procedure: OPEN REDUCTION INTERNAL FIXATION (ORIF) LEFT ANKLE FRACTURE;  Surgeon: Terance Hart, MD;  Location: [facility] OR;  Service: Orthopedics;  Laterality: Left;   POLYPECTOMY  10/20/2018   Procedure: POLYPECTOMY;  Surgeon: Mansouraty, Netty Starring., MD;  Location: [facility] ENDOSCOPY;  Service: Gastroenterology;;  POLYPECTOMY  11/02/2019   Procedure: POLYPECTOMY;  Surgeon: Mansouraty, Netty Starring., MD;  Location: [facility] ENDOSCOPY;  Service: Gastroenterology;;   POLYPECTOMY  08/31/2020   Procedure: POLYPECTOMY;  Surgeon: Lemar Lofty., MD;  Location: [facility] ENDOSCOPY;  Service: Gastroenterology;;   Brooke Dare INJECTION  10/20/2018   Procedure: SUBMUCOSAL LIFTING INJECTION;  Surgeon: Lemar Lofty., MD;  Location: [facility] ENDOSCOPY;  Service: Gastroenterology;;   SYNDESMOSIS REPAIR Left 02/19/2019   Procedure: SYNDESMOSIS REPAIR;  Surgeon: Terance Hart, MD;  Location: [facility] OR;  Service: Orthopedics;  Laterality: Left;   THORACIC DISCECTOMY  07/2007   T12   TUBAL LIGATION     WISDOM TOOTH EXTRACTION     Allergies  Allergen Reactions   Amoxicillin-Pot Clavulanate Nausea And Vomiting    Patient denies knowledge of this allergy  Did it involve swelling of the face/tongue/throat, SOB, or low BP? No Did it involve sudden or severe rash/hives, skin peeling, or any reaction on the inside of your mouth or nose? No Did you need to seek medical attention at a hospital or  doctor's office? No When did it last happen?      in 2016 If all above answers are "NO", may proceed with cephalosporin use.    Doxycycline Hives   Suvorexant Other (See Comments)    Nightmares       Objective:    Physical Exam Vitals and nursing note reviewed.  Constitutional:      Appearance: Normal appearance.  Cardiovascular:     Rate and Rhythm: Normal rate and regular rhythm.  Pulmonary:     Effort: Pulmonary effort is normal.     Breath sounds: Normal breath sounds.  Musculoskeletal:        General: Normal range of motion.  Skin:    General: Skin is warm and dry.  Neurological:     Mental Status: She is alert.  Psychiatric:        Mood and Affect: Mood normal.        Behavior: Behavior normal.    BP 124/70   Pulse 74   Temp (!) 97.3 F (36.3 C)   Ht 5\' 2"  (1.575 m)   Wt 139 lb 9.6 oz (63.3 kg)   SpO2 97%   BMI 25.53 kg/m  Wt Readings from Last 3 Encounters:  09/28/22 139 lb 9.6 oz (63.3 kg)  09/17/22 137 lb 3.2 oz (62.2 kg)  09/06/22 140 lb 6.4 oz (63.7 kg)      Dulce Sellar, NP

## 2022-09-28 ENCOUNTER — Encounter: Payer: Self-pay | Admitting: Family

## 2022-09-28 ENCOUNTER — Other Ambulatory Visit: Payer: Medicare Other

## 2022-09-28 ENCOUNTER — Ambulatory Visit (INDEPENDENT_AMBULATORY_CARE_PROVIDER_SITE_OTHER): Payer: Medicare Other | Admitting: Family

## 2022-09-28 VITALS — BP 124/70 | HR 74 | Temp 97.3°F | Ht 62.0 in | Wt 139.6 lb

## 2022-09-28 DIAGNOSIS — E870 Hyperosmolality and hypernatremia: Secondary | ICD-10-CM | POA: Diagnosis not present

## 2022-09-28 DIAGNOSIS — R350 Frequency of micturition: Secondary | ICD-10-CM | POA: Diagnosis not present

## 2022-09-28 LAB — POCT URINALYSIS DIPSTICK
Bilirubin, UA: NEGATIVE
Blood, UA: NEGATIVE
Glucose, UA: NEGATIVE
Ketones, UA: NEGATIVE
Leukocytes, UA: NEGATIVE
Nitrite, UA: NEGATIVE
Protein, UA: NEGATIVE
Spec Grav, UA: 1.015 (ref 1.010–1.025)
Urobilinogen, UA: 0.2 E.U./dL
pH, UA: 7 (ref 5.0–8.0)

## 2022-09-28 LAB — BASIC METABOLIC PANEL
BUN: 14 mg/dL (ref 6–23)
CO2: 27 mEq/L (ref 19–32)
Calcium: 9.3 mg/dL (ref 8.4–10.5)
Chloride: 97 mEq/L (ref 96–112)
Creatinine, Ser: 0.52 mg/dL (ref 0.40–1.20)
GFR: 85.46 mL/min (ref >60.00)
Glucose, Bld: 103 mg/dL — ABNORMAL HIGH (ref 70–99)
Potassium: 4 mEq/L (ref 3.5–5.1)
Sodium: 131 mEq/L — ABNORMAL LOW (ref 135–145)

## 2022-10-01 NOTE — Progress Notes (Signed)
Her sodium is stable but still not quite back to baseline. Is she still having diarrhea?

## 2022-10-01 NOTE — Progress Notes (Signed)
We need to correct her diarrhea because this is probably what's causing her sodium to be low. Recommend she follow up with gastroenterology for her diarrhea soon as we discussed at her office visit.  Joan Mclaughlin. [name], MD 10/01/2022 1:05 PM

## 2022-10-02 ENCOUNTER — Telehealth: Payer: Self-pay | Admitting: Family Medicine

## 2022-10-02 NOTE — Telephone Encounter (Signed)
Patient states she spoke with Joan Mclaughlin about GI issues and was advised to make an OV with gastroenterology. Patient states she called GI and they couldn't get her in until 01/10/23. She did schedule the OV but wants to know if PCP or Joan Mclaughlin recommend anything else. Please Advise.

## 2022-10-03 ENCOUNTER — Telehealth: Payer: Self-pay | Admitting: Neurology

## 2022-10-03 NOTE — Telephone Encounter (Signed)
Pt called in stating she has a low sodium level. Her PCP told her that it could be the oxcarbazepine. She would like to see if Dr. Karel Jarvis agree's with her PCP?

## 2022-10-03 NOTE — Telephone Encounter (Signed)
Pls let her know that oxcarbazepine may lower sodium level in some patients. I reviewed her levels, it has improved since last month, which is a good thing and may point to other causes of low sodium aside from oxcarbazepine. As long as above 130, I am not very concerned in patients on oxcarbazepine. Her last level was 131.  Just continue follow-up sodium levels with PCP to monitor. Thanks

## 2022-10-03 NOTE — Telephone Encounter (Signed)
Pt called an informed that  oxcarbazepine may lower sodium level in some patients. Dr Karel Jarvis reviewed her levels, it has improved since last month, which is a good thing and may point to other causes of low sodium aside from oxcarbazepine. As long as above 130, Dr Karel Jarvis is not very concerned in patients on oxcarbazepine. Her last level was 131.  Just continue follow-up sodium levels with PCP to monitor

## 2022-10-04 NOTE — Telephone Encounter (Signed)
Can we have her schedule an appointment here?  Joan Mclaughlin. [name], MD 10/04/2022 12:36 PM

## 2022-10-04 NOTE — Telephone Encounter (Signed)
Can we have her schedule an appointment here?   Joan Mclaughlin. [name], MD

## 2022-10-04 NOTE — Telephone Encounter (Signed)
Please advise 

## 2022-10-04 NOTE — Telephone Encounter (Signed)
Called and informed pt of PCP message below. Informed her to keep OV for 9/30 @ 1:40 pm with Dr. [name]. Pt verbalized understanding.

## 2022-10-05 NOTE — Telephone Encounter (Signed)
Noted  

## 2022-10-08 ENCOUNTER — Encounter: Payer: Self-pay | Admitting: Family Medicine

## 2022-10-08 ENCOUNTER — Ambulatory Visit (INDEPENDENT_AMBULATORY_CARE_PROVIDER_SITE_OTHER): Payer: Medicare Other | Admitting: Family Medicine

## 2022-10-08 VITALS — BP 139/81 | HR 85 | Temp 98.2°F | Ht 62.0 in | Wt 138.6 lb

## 2022-10-08 DIAGNOSIS — G40009 Localization-related (focal) (partial) idiopathic epilepsy and epileptic syndromes with seizures of localized onset, not intractable, without status epilepticus: Secondary | ICD-10-CM

## 2022-10-08 DIAGNOSIS — R197 Diarrhea, unspecified: Secondary | ICD-10-CM

## 2022-10-08 DIAGNOSIS — R3915 Urgency of urination: Secondary | ICD-10-CM | POA: Diagnosis not present

## 2022-10-08 DIAGNOSIS — I159 Secondary hypertension, unspecified: Secondary | ICD-10-CM

## 2022-10-08 DIAGNOSIS — E871 Hypo-osmolality and hyponatremia: Secondary | ICD-10-CM

## 2022-10-08 LAB — URINALYSIS, ROUTINE W REFLEX MICROSCOPIC
Bilirubin Urine: NEGATIVE
Ketones, ur: NEGATIVE
Nitrite: NEGATIVE
Specific Gravity, Urine: 1.025 (ref 1.000–1.030)
Total Protein, Urine: NEGATIVE
Urine Glucose: NEGATIVE
Urobilinogen, UA: 0.2 (ref 0.0–1.0)
pH: 6 (ref 5.0–8.0)

## 2022-10-08 NOTE — Patient Instructions (Signed)
It was very nice to see you today!  We need to make sure you do not have C. difficile.  Will check a stool sample.  Will recheck your sodium numbers today.  Please make sure that you are getting a balanced diet.  You can add more salt to your food if needed.  We will check a urine sample to make sure that you do not have a UTI.  We will contact you with results once we get them back.  Please let us know if you have any change in symptoms.  Return if symptoms worsen or fail to improve.   Take care, Dr [name]  PLEASE NOTE:  If you had any lab tests, please let us know if you have not heard back within a few days. You may see your results on mychart before we have a chance to review them but we will give you a call once they are reviewed by [place].   If we ordered any referrals today, please let us know if you have not heard from their office within the next week.   If you had any urgent prescriptions sent in today, please check with the pharmacy within an hour of our visit to make sure the prescription was transmitted appropriately.   Please try these tips to maintain a healthy lifestyle:  Eat at least 3 REAL meals and 1-2 snacks per day.  Aim for no more than 5 hours between eating.  If you eat breakfast, please do so within one hour of getting up.   Each meal should contain half fruits/vegetables, one quarter protein, and one quarter carbs (no bigger than a computer mouse)  Cut down on sweet beverages. This includes juice, soda, and sweet tea.   Drink at least 1 glass of water with each meal and aim for at least 8 glasses per day  Exercise at least 150 minutes every week.

## 2022-10-08 NOTE — Assessment & Plan Note (Signed)
At goal today on amlodipine 5 mg daily. 

## 2022-10-08 NOTE — Progress Notes (Signed)
   Joan Mclaughlin is a 82 y.o. female who presents today for an office visit.  Assessment/Plan:  New/Acute Problems: Hyponatremia Sodium 131 on last metabolic panel.  Will recheck today.  Likely multifactorial in nature.  She does have recurrent issues with diarrhea which we are addressing as below which could be contributing.  Also on oxcarbazepine which could be contributing.  She did recently discussed with neurology who recommended she stay on this unless sodium levels drop below 130.  We did discuss importance of balanced diet.  He is okay for her to add extra sodium to her food.  If her hyponatremia persists and we resolve her diarrhea issues as below would consider referral to nephrology for further management evaluation.  Diarrhea  No red flags.  She still has intermittent issues with watery diarrhea.  She did call to schedule appointment however she would not be able to be seen by them for 3 months.Likely contributing to her hyponatremia as above.  Will recheck C. difficile toxin and C. difficile PCR per GI recommendations.  Needs to follow-up with GI if still having intermittent diarrhea and workup is negative.  Urinary Urgency / Frequency Concern for possible OAB.  We will check urine culture to definitively rule out UTI.  Also check send out urinalysis.  Depending on results may consider referral to urology versus trial of Myrbetriq.  Chronic Problems Addressed Today: Localization-related idiopathic epilepsy and epileptic syndromes with seizures of localized onset, not intractable, without status epilepticus (HCC) Along with neurology on Trileptal 300 mg twice daily.  Discussed potentially could be causing some of her hyponatremia however they would like for her to stay on this and less her sodium drops below 130.  Hypertension At goal today on amlodipine 5 mg daily.     Subjective:  HPI:  See A/P for status of chronic conditions.  Patient is here today for follow-up.  We  last saw her 3 weeks ago.At that time, she was having ongoing issues with hyponatremia.  initially thought it was due to GI losses due to her recent issues with diarrhea.  We rechecked a metabolic panel 10 days ago that showed her sodium and not improved back to her previous baseline.  She is still having watery diarrhea on and off. Usually takes imodium which helps. Last episode of diarrhea was a few days ago.  Overall feels fatigued and "off."  But otherwise feels fine.  No fevers or chills.  No abdominal pain.  No bloody diarrhea.  Occasional nausea.  No vomiting.    She also saw a different provider here 10 days ago for urinary frequency.  Urinalysis dipstick at that time was negative.  She was instructed to start Kegel and pelvic floor exercises.  Still has persistence of symptoms.  No dysuria.  No fevers or chills.       Objective:  Physical Exam: BP 139/81 (BP Location: Left Arm, Patient Position: Sitting, Cuff Size: Normal)   Pulse 85   Temp 98.2 F (36.8 C) (Temporal)   Ht 5\' 2"  (1.575 m)   Wt 138 lb 9.6 oz (62.9 kg)   SpO2 97%   BMI 25.35 kg/m   Gen: No acute distress, resting comfortably CV: Regular rate and rhythm with no murmurs appreciated Pulm: Normal work of breathing, clear to auscultation bilaterally with no crackles, wheezes, or rhonchi Neuro: Grossly normal, moves all extremities Psych: Normal affect and thought content      Nahsir Venezia M. [name], MD 10/08/2022 2:19 PM

## 2022-10-08 NOTE — Assessment & Plan Note (Signed)
Along with neurology on Trileptal 300 mg twice daily.  Discussed potentially could be causing some of her hyponatremia however they would like for her to stay on this and less her sodium drops below 130.

## 2022-10-09 DIAGNOSIS — R197 Diarrhea, unspecified: Secondary | ICD-10-CM | POA: Diagnosis not present

## 2022-10-09 LAB — BASIC METABOLIC PANEL
BUN: 16 mg/dL (ref 6–23)
CO2: 27 meq/L (ref 19–32)
Calcium: 9.4 mg/dL (ref 8.4–10.5)
Chloride: 99 meq/L (ref 96–112)
Creatinine, Ser: 0.63 mg/dL (ref 0.40–1.20)
GFR: 81.58 mL/min (ref >60.00)
Glucose, Bld: 89 mg/dL (ref 70–99)
Potassium: 4 meq/L (ref 3.5–5.1)
Sodium: 135 meq/L (ref 135–145)

## 2022-10-10 LAB — CLOSTRIDIUM DIFFICILE BY PCR: Toxigenic C. Difficile by PCR: POSITIVE — AB

## 2022-10-11 LAB — URINE CULTURE
MICRO NUMBER:: 15530600
SPECIMEN QUALITY:: ADEQUATE

## 2022-10-11 NOTE — Progress Notes (Signed)
Her urine culture was positive for bacterial infection.  Please send in Macrobid 100 mg twice daily for 7 days.  Her stool sample still shows C. difficile.  We should treat this as well.  Please send in vancomycin 125 mg 4 times daily for 10 days.  I would like for her to follow-up with [place] in a couple of weeks.  She should let us know if her symptoms are not improving with above medications.

## 2022-10-12 ENCOUNTER — Other Ambulatory Visit: Payer: Self-pay | Admitting: *Deleted

## 2022-10-12 MED ORDER — NITROFURANTOIN MONOHYD MACRO 100 MG PO CAPS: 100.0000 mg | ORAL_CAPSULE | Freq: Two times a day (BID) | ORAL | 0 refills | Status: AC

## 2022-10-12 MED ORDER — VANCOMYCIN HCL 125 MG PO CAPS: 125.0000 mg | ORAL_CAPSULE | Freq: Four times a day (QID) | ORAL | 0 refills | Status: AC

## 2022-10-13 LAB — C. DIFFICILE GDH AND TOXIN A/B
GDH ANTIGEN: DETECTED
MICRO NUMBER:: 15536448
SPECIMEN QUALITY:: ADEQUATE
TOXIN A AND B: NOT DETECTED

## 2022-10-13 LAB — CLOSTRIDIUM DIFFICILE TOXIN B, QUALITATIVE, REAL-TIME PCR: Toxigenic C. Difficile by PCR: DETECTED — AB

## 2022-10-16 ENCOUNTER — Telehealth: Payer: Self-pay | Admitting: Family Medicine

## 2022-10-16 NOTE — Telephone Encounter (Signed)
Pt states she is taking her antibiotics but feeling worse. Please advise.

## 2022-10-17 NOTE — Telephone Encounter (Signed)
Patient called back wanting to know PCP's opinion on if he believes she should go to the ED or not. Due to this request, I have offered for pt to speak with triage in the meantime.    FYI: This call has been transferred to triage nurse: Access Nurse. Once the result note has been entered staff can address the message at that time.  Patient called in with the following symptoms:  Red Word: Worsening diarrhea    Please advise at Mobile [phone] (mobile)  Message is routed to Provider Pool.

## 2022-10-17 NOTE — Telephone Encounter (Signed)
Final Outcome: Home care--Pt informed triage that she was feeling better after abx symptoms have improved.    Patient Name First: Joan Mclaughlin Last: [facility] Gender: Female DOB: 20-Jan-1940 Age: 82 Y 2 M 21 D Return Phone Number: [phone] (Primary) Address: City/ State/ Zip: [place]  [address] Client [facility] - Administrator, [facility] Provider Jacquiline Doe- MD Contact Type Call Who Is Calling Patient / Member / Family / Caregiver Call Type Triage / Clinical Relationship To Patient Self Return Phone Number [phone] (Primary) Chief Complaint Diarrhea Reason for Call Symptomatic / Request for Health Information Initial Comment Caller states she was on 9/30, was given antibiotics but she's not seeing improvement. Wondering if an ER visit is needed. She still has the diarrhea but is not as bad as it was yesterday. She is on UTI medication which she finishes today and medication for c diff. Urine output is normal. Translation No Nurse Assessment Nurse: Zena Amos, RN, Margaret Date/Time (Eastern Time): 10/17/2022 12:05:15 PM Confirm and document reason for call. If symptomatic, describe symptoms. ---Caller states she was on abx for UTI, she finished that today, her UTI symptoms seems improved. She is on abx for C-diff, has had improvement with diarrhea since she started. Does the patient have any new or worsening symptoms? ---Yes Will a triage be completed? ---Yes Related visit to physician within the last 2 weeks? ---Yes Does the PT have any chronic conditions? (i.e. diabetes, asthma, this includes High risk factors for pregnancy, etc.) ---Yes List chronic conditions. ---seizure, htn Is this a behavioral health or substance abuse call? ---No Guidelines Guideline Title Affirmed Question Affirmed Notes Nurse Date/Time (Eastern Time) Infection on Antibiotic Follow-up Call [1] Taking  antibiotic AND [2] symptoms Zena Amos, RNClaris Che 10/17/2022 12:08:39 PM Guidelines Guideline Title Affirmed Question Affirmed Notes Nurse Date/Time (Eastern Time) are BETTER AND [3] no fever Disp. Time Lamount Cohen Time) Disposition Final User 10/17/2022 12:14:49 PM Home Care Yes Zena Amos, RN, Margaret Final Disposition 10/17/2022 12:14:49 PM Home Care Yes Zena Amos, RN, Ruel Favors Disagree/Comply Comply Caller Understands Yes PreDisposition Call Pharmacist Care Advice Given Per Guideline * You should be able to treat this at home. HOME CARE: REASSURANCE AND EDUCATION: * You were recently diagnosed with a bacterial infection and started on an antibiotic. * You've told me that your symptoms are GETTING BETTER and that there is no fever. CONTINUE ANTIBIOTIC: * Continue the prescribed antibiotic. * Also, continue any other treatment instructions from your doctor (or NP/PA). * After 72 hours (3 Days): You should be improving. You should not have any fever. All symptoms should be GETTING BETTER (improved). * After Finishing Antibiotics: All symptoms should be gone. You should feel back to normal. CALL BACK IF: * Fever occurs * You become worse CARE ADVICE given per Infection on Antibiotic Follow-up Call (Adult) guideline.

## 2022-10-18 NOTE — Telephone Encounter (Signed)
See note

## 2022-10-25 ENCOUNTER — Ambulatory Visit: Payer: Medicare Other | Admitting: Family Medicine

## 2022-10-25 ENCOUNTER — Encounter: Payer: Self-pay | Admitting: Family Medicine

## 2022-10-25 VITALS — BP 136/81 | HR 66 | Temp 97.5°F | Ht 62.0 in | Wt 139.0 lb

## 2022-10-25 DIAGNOSIS — R059 Cough, unspecified: Secondary | ICD-10-CM | POA: Diagnosis not present

## 2022-10-25 DIAGNOSIS — R5383 Other fatigue: Secondary | ICD-10-CM | POA: Diagnosis not present

## 2022-10-25 DIAGNOSIS — R197 Diarrhea, unspecified: Secondary | ICD-10-CM | POA: Diagnosis not present

## 2022-10-25 DIAGNOSIS — K5790 Diverticulosis of intestine, part unspecified, without perforation or abscess without bleeding: Secondary | ICD-10-CM

## 2022-10-25 DIAGNOSIS — R109 Unspecified abdominal pain: Secondary | ICD-10-CM | POA: Diagnosis not present

## 2022-10-25 DIAGNOSIS — A0472 Enterocolitis due to Clostridium difficile, not specified as recurrent: Secondary | ICD-10-CM

## 2022-10-25 DIAGNOSIS — I159 Secondary hypertension, unspecified: Secondary | ICD-10-CM

## 2022-10-25 LAB — TSH: TSH: 1.65 u[IU]/mL (ref 0.35–5.50)

## 2022-10-25 LAB — CBC
HCT: 43.8 % (ref 36.0–46.0)
Hemoglobin: 14.9 g/dL (ref 12.0–15.0)
MCHC: 34.1 g/dL (ref 30.0–36.0)
MCV: 95.2 fL (ref 78.0–100.0)
Platelets: 277 10*3/uL (ref 150.0–400.0)
RBC: 4.6 Mil/uL (ref 3.87–5.11)
RDW: 13.1 % (ref 11.5–15.5)
WBC: 6 10*3/uL (ref 4.0–10.5)

## 2022-10-25 LAB — VITAMIN D 25 HYDROXY (VIT D DEFICIENCY, FRACTURES): VITD: 60.55 ng/mL (ref 30.00–100.00)

## 2022-10-25 LAB — COMPREHENSIVE METABOLIC PANEL
ALT: 18 U/L (ref 0–35)
AST: 21 U/L (ref 0–37)
Albumin: 4.5 g/dL (ref 3.5–5.2)
Alkaline Phosphatase: 82 U/L (ref 39–117)
BUN: 12 mg/dL (ref 6–23)
CO2: 29 meq/L (ref 19–32)
Calcium: 9.6 mg/dL (ref 8.4–10.5)
Chloride: 93 meq/L — ABNORMAL LOW (ref 96–112)
Creatinine, Ser: 0.55 mg/dL (ref 0.40–1.20)
GFR: 84.27 mL/min (ref >60.00)
Glucose, Bld: 83 mg/dL (ref 70–99)
Potassium: 4.3 meq/L (ref 3.5–5.1)
Sodium: 130 meq/L — ABNORMAL LOW (ref 135–145)
Total Bilirubin: 0.6 mg/dL (ref 0.2–1.2)
Total Protein: 7.7 g/dL (ref 6.0–8.3)

## 2022-10-25 LAB — POC COVID19 BINAXNOW: SARS Coronavirus 2 Ag: NEGATIVE

## 2022-10-25 LAB — VITAMIN B12: Vitamin B-12: 1037 pg/mL — ABNORMAL HIGH (ref 211–911)

## 2022-10-25 NOTE — Patient Instructions (Signed)
It was very nice to see you today!  I think most of your symptoms are coming from your diarrhea.  We need to work to figure out what is causing your recurrent issues.  We will check a CT scan.  Will also check labs.  Please come back in a week or so for repeat C. difficile testing if you are still having symptoms.  Return if symptoms worsen or fail to improve.   Take care, Dr [name]  PLEASE NOTE:  If you had any lab tests, please let us know if you have not heard back within a few days. You may see your results on mychart before we have a chance to review them but we will give you a call once they are reviewed by [place].   If we ordered any referrals today, please let us know if you have not heard from their office within the next week.   If you had any urgent prescriptions sent in today, please check with the pharmacy within an hour of our visit to make sure the prescription was transmitted appropriately.   Please try these tips to maintain a healthy lifestyle:  Eat at least 3 REAL meals and 1-2 snacks per day.  Aim for no more than 5 hours between eating.  If you eat breakfast, please do so within one hour of getting up.   Each meal should contain half fruits/vegetables, one quarter protein, and one quarter carbs (no bigger than a computer mouse)  Cut down on sweet beverages. This includes juice, soda, and sweet tea.   Drink at least 1 glass of water with each meal and aim for at least 8 glasses per day  Exercise at least 150 minutes every week.

## 2022-10-25 NOTE — Progress Notes (Signed)
Joan Mclaughlin is a 82 y.o. female who presents today for an office visit.  Assessment/Plan:  New/Acute Problems: Diarrhea / C Diff She completed her course of vancomycin 125 mg 4 times daily for 10 days.  She unfortunately is still having some issues with frequent bowel movements and loose stools.  Had several episodes of watery diarrhea yesterday.  Discussed with patient it would be a low utility to recheck C. difficile panel at this point.  She was doing relatively well until her episode yesterday.  We discussed repeat round of antibiotics versus watchful waiting.  Will continue with watchful waiting for now.  Reassess in 1 week.  Still having ongoing issues with frequent bowel movements and watery diarrhea will need to try alternative course for treatment for C. difficile.  We have referred her to GI for this however she is not able to see them for another couple of months.  She does also have a history of diverticulosis.  Given her intermittent issues with left-sided abdominal pain would be reasonable for [place] to check a CT scan at this point to further evaluate.  We will order this today.  We discussed reasons to return to care and seek urgent care.  She will follow-up with [place] in about a week or so.  Other Fatigue  Multifactorial however likely due to her recent issues with hyponatremia related to her frequent diarrhea and bowel movements as above.  This has been gradually improving over the last several weeks however we will check labs again today.  Also check CBC, c-Met, TSH, B12, and vitamin D.    Patient is very concerned about possible other etiologies including sepsis, long COVID, etc. Reassured patient that she does not have any clinical signs or symptoms concerning for sepsis or serious intra-abdominal pathology.  Will be checking CT scan as above to assess for diverticulosis however no peritoneal signs on exam and very low suspicion for appendicitis, megacolon, etc.  Discussed that  we should work on resolving her diarrhea before exploring other possibilities for her fatigue.  She voiced understanding.  She will let us know if she has any change in symptoms between now and her next visit.  Chronic Problems Addressed Today: Hypertension Blood pressure at goal on amlodipine 5 mg daily.  Diverticulosis Will be checking CT scan to rule out diverticulitis.  Has not had any flareups since last year.     Subjective:  HPI:  See Assessment / plan for status of chronic conditions. Patient here today for follow up.  We last saw her 17 days ago. At that time she did have ongoing issues with diarrhea. She did have a positive C diff test at that time. We started her on vancomycin 125 mg 4 times daily.  She was compliant with this including course of antibiotics.  She has had persistent diarrhea since then.  Had several episodes of profuse watery diarrhea yesterday.  Has had intermittent issues with explosive diarrhea and sudden urgency to have a bowel movement.  She has not noticed any melena or hematochezia.  Occasionally gets cramping sensation in left side of abdomen.  Still has a lot of fatigue and lethargy.  Overall feels weak and rundown.  She was instantly found to have an E. coli UTI at her visit a couple of weeks ago.  She was treated with course of Macrobid.  Her UTI symptoms have resolved.       Objective:  Physical Exam: BP 136/81   Pulse 66  Temp (!) 97.5 F (36.4 C) (Temporal)   Ht 5\' 2"  (1.575 m)   Wt 139 lb (63 kg)   SpO2 100%   BMI 25.42 kg/m   Gen: No acute distress, resting comfortably CV: Regular rate and rhythm with no murmurs appreciated Pulm: Normal work of breathing, clear to auscultation bilaterally with no crackles, wheezes, or rhonchi Neuro: Grossly normal, moves all extremities Psych: Normal affect and thought content      Imraan Wendell M. [name], MD 10/25/2022 1:50 PM

## 2022-10-25 NOTE — Assessment & Plan Note (Signed)
Blood pressure at goal on amlodipine 5 mg daily.

## 2022-10-25 NOTE — Assessment & Plan Note (Signed)
Will be checking CT scan to rule out diverticulitis.  Has not had any flareups since last year.

## 2022-10-29 ENCOUNTER — Telehealth: Payer: Self-pay | Admitting: Family Medicine

## 2022-10-29 NOTE — Telephone Encounter (Signed)
Patient requests to be called to be given Lab results

## 2022-10-30 NOTE — Telephone Encounter (Signed)
Patient requesting lab results

## 2022-10-31 ENCOUNTER — Ambulatory Visit (HOSPITAL_BASED_OUTPATIENT_CLINIC_OR_DEPARTMENT_OTHER)
Admission: RE | Admit: 2022-10-31 | Discharge: 2022-10-31 | Disposition: A | Discharge status: Home / Self Care | Payer: Medicare Other | Source: Ambulatory Visit | Attending: Family Medicine | Admitting: Family Medicine

## 2022-10-31 DIAGNOSIS — R109 Unspecified abdominal pain: Secondary | ICD-10-CM | POA: Diagnosis not present

## 2022-10-31 DIAGNOSIS — R1031 Right lower quadrant pain: Secondary | ICD-10-CM | POA: Diagnosis not present

## 2022-10-31 DIAGNOSIS — K5792 Diverticulitis of intestine, part unspecified, without perforation or abscess without bleeding: Secondary | ICD-10-CM | POA: Diagnosis not present

## 2022-10-31 DIAGNOSIS — K819 Cholecystitis, unspecified: Secondary | ICD-10-CM | POA: Diagnosis not present

## 2022-10-31 MED ORDER — IOHEXOL 300 MG/ML  SOLN
100.0000 mL | Freq: Once | INTRAMUSCULAR | Status: AC | PRN
  Administered 2022-10-31: 100 mL via INTRAVENOUS

## 2022-10-31 NOTE — Telephone Encounter (Signed)
Please see result note.  Joan Mclaughlin. [name], MD 10/31/2022 9:21 AM

## 2022-10-31 NOTE — Progress Notes (Signed)
Her sodium is still low. Is she still having diarrhea? If her diarrhea has resolved we need to look for other causes of her low sodium.

## 2022-11-01 NOTE — Progress Notes (Signed)
If she is not having persistent diarrhea then recommend we refer to nephrology to look for other causes for her low sodium.  Katina Degree. [name], MD 11/01/2022 12:42 PM

## 2022-11-06 NOTE — Progress Notes (Signed)
Her CT scan is normal.  No signs of diverticulitis or infection.  She needs to go back to GI if she is still having ongoing issues with diarrhea.

## 2022-11-07 ENCOUNTER — Telehealth: Payer: Self-pay | Admitting: Family Medicine

## 2022-11-07 NOTE — Telephone Encounter (Signed)
Patient called stating she was returning Stella's call.

## 2022-11-09 ENCOUNTER — Encounter: Payer: Self-pay | Admitting: Gastroenterology

## 2022-11-09 ENCOUNTER — Ambulatory Visit: Payer: Medicare Other | Admitting: Gastroenterology

## 2022-11-09 VITALS — BP 140/80 | HR 79 | Ht 61.0 in | Wt 138.0 lb

## 2022-11-09 DIAGNOSIS — A498 Other bacterial infections of unspecified site: Secondary | ICD-10-CM | POA: Insufficient documentation

## 2022-11-09 NOTE — Progress Notes (Signed)
11/09/2022 Joan Mclaughlin [id number] June 29, 1940   HISTORY OF PRESENT ILLNESS:  This is an 82 year old female who is a patient of Dr. Elesa Hacker.  Has followed with him regularly for colonoscopies with polypectomies.  Started having diarrhea a couple of months ago.  Had a positive study in August but diarrhea reportedly resolved when she was seen here so she was not treated.  Diarrhea recurred and stool positive again on 10/1.  Treated with vancomycin for 10 days.  At this point she has been finished with treatment for a couple of weeks.  Diarrhea much improved, having some normal BMs.  She admits that she had been on several antibiotics throughout the year for different things including UTIs, etc.   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/2018   RIGHT FOREARM    Squamous cell carcinoma of skin 09/30/2019   in situ-left lower leg-anterior   Trimalleolar fracture    left   Tubular adenoma of colon 06/27/2018   Past Surgical History:  Procedure Laterality Date   CHOLECYSTECTOMY N/A 12/23/2014    Procedure: LAPAROSCOPIC CHOLECYSTECTOMY WITH INTRAOPERATIVE CHOLANGIOGRAM;  Surgeon: Gaynelle Adu, MD;  Location: [facility] ORS;  Service: General;  Laterality: N/A;   COLONOSCOPY     "3 times trying to get large polyp since August 2020"   COLONOSCOPY WITH PROPOFOL N/A 10/20/2018   Procedure: COLONOSCOPY WITH PROPOFOL;  Surgeon: Lemar Lofty., MD;  Location: [facility] ENDOSCOPY;  Service: Gastroenterology;  Laterality: N/A;   COLONOSCOPY WITH PROPOFOL N/A 11/02/2019   Procedure: COLONOSCOPY WITH PROPOFOL;  Surgeon: Meridee Score Netty Starring., MD;  Location: [facility] ENDOSCOPY;  Service: Gastroenterology;  Laterality: N/A;   COLONOSCOPY WITH PROPOFOL N/A 08/31/2020   Procedure: COLONOSCOPY WITH PROPOFOL;  Surgeon: Meridee Score Netty Starring., MD;  Location: [facility] ENDOSCOPY;  Service: Gastroenterology;  Laterality: N/A;   ENDOSCOPIC MUCOSAL RESECTION N/A 10/20/2018   Procedure: ENDOSCOPIC MUCOSAL RESECTION;  Surgeon: Meridee Score Netty Starring., MD;  Location: [facility] ENDOSCOPY;  Service: Gastroenterology;  Laterality: N/A;   ENDOSCOPIC MUCOSAL RESECTION  11/02/2019   Procedure: ENDOSCOPIC MUCOSAL RESECTION;  Surgeon: Meridee Score Netty Starring., MD;  Location[facility] END[facility]COPY;  Service: Gastroenterology;;   HEMOSTASIS CLIP PLACEMENT  10/20/2018   Procedure: HEMOSTASIS CLIP PLACEMENT;  Surgeon: Lemar Lofty., MD;  Location: [facility] ENDOSCOPY;  Service: Gastroenterology;;   HEMOSTASIS CLIP PLACEMENT  11/02/2019  Procedure: HEMOSTASIS CLIP PLACEMENT;  Surgeon: Lemar Lofty., MD;  Location: [facility] ENDOSCOPY;  Service: Gastroenterology;;   ORIF ANKLE FRACTURE Left 02/19/2019   Procedure: OPEN REDUCTION INTERNAL FIXATION (ORIF) LEFT ANKLE FRACTURE;  Surgeon: Terance Hart, MD;  Location: [facility] OR;  Service: Orthopedics;  Laterality: Left;   POLYPECTOMY  10/20/2018   Procedure: POLYPECTOMY;  Surgeon: Mansouraty, Netty Starring., MD;  Location: [facility] ENDOSCOPY;  Service: Gastroenterology;;   POLYPECTOMY  11/02/2019   Procedure:  POLYPECTOMY;  Surgeon: Lemar Lofty., MD;  Location: [facility] ENDOSCOPY;  Service: Gastroenterology;;   POLYPECTOMY  08/31/2020   Procedure: POLYPECTOMY;  Surgeon: Lemar Lofty., MD;  Location: [facility] ENDOSCOPY;  Service: Gastroenterology;;   SUBMUCOSAL LIFTING INJECTION  10/20/2018   Procedure: SUBMUCOSAL LIFTING INJECTION;  Surgeon: Lemar Lofty., MD;  Location: [facility] ENDOSCOPY;  Service: Gastroenterology;;   SYNDESMOSIS REPAIR Left 02/19/2019   Procedure: SYNDESMOSIS REPAIR;  Surgeon: Terance Hart, MD;  Location: [facility] OR;  Service: Orthopedics;  Laterality: Left;   THORACIC DISCECTOMY  07/2007   T12   TUBAL LIGATION     WISDOM TOOTH EXTRACTION      reports that she has never smoked. She has never used smokeless tobacco. She reports that she does not drink alcohol and does not use drugs. family history includes Alcohol abuse in her brother, daughter, mother, and son; Arthritis in her father; Depression in her sister, sister, and sister; Diabetes in her mother; Early death in her sister; Hearing loss in her sister; Heart disease in her mother and sister; Hyperlipidemia in her sister; Hypertension in her mother; Stroke in her mother. Allergies  Allergen Reactions   Amoxicillin-Pot Clavulanate Nausea And Vomiting    Patient denies knowledge of this allergy  Did it involve swelling of the face/tongue/throat, SOB, or low BP? No Did it involve sudden or severe rash/hives, skin peeling, or any reaction on the inside of your mouth or nose? No Did you need to seek medical attention at a hospital or doctor's office? No When did it last happen?      in 2016 If all above answers are "NO", may proceed with cephalosporin use.    Doxycycline Hives   Suvorexant Other (See Comments)    Nightmares       Outpatient Encounter Medications as of 11/09/2022  Medication Sig   amLODipine (NORVASC) 5 MG tablet Take 1 tablet (5 mg total) by mouth daily.   Ascorbic Acid (VITAMIN C) 1000  MG tablet Take 1,000 mg by mouth daily.   b complex vitamins capsule Take 1 capsule by mouth daily.   CALCIUM-MAGNESIUM PO Take 1 tablet by mouth daily. 1000 mg / 500 mg   Cholecalciferol (VITAMIN D3) 50 MCG (2000 UT) capsule Take 2,000 Units by mouth daily.   hydrocortisone 2.5 % cream Apply topically 2 (two) times daily as needed (Rash). (Patient taking differently: Apply 1 application  topically 2 (two) times daily as needed (Rosacea).)   hydrOXYzine (ATARAX) 10 MG tablet TAKE 1 TO 2 TABLETS (10-20 MG TOTAL) BY MOUTH AT BEDTIME   ketoconazole (NIZORAL) 2 % shampoo Use 2-3 times weekly as needed   Lysine 500 MG TABS Take 500 mg by mouth daily.   MELATONIN PO Take 1 capsule by mouth at bedtime.   Multiple Vitamin (MULTIVITAMIN WITH MINERALS) TABS tablet Take 1 tablet by mouth daily.   Omega-3 Fatty Acids (FISH OIL PO) Take 630 mg by mouth daily.   omeprazole (PRILOSEC) 40 MG capsule Take 40 mg by mouth daily.  ondansetron (ZOFRAN) 4 MG tablet Take 1 tablet (4 mg total) by mouth every 8 (eight) hours as needed for nausea or vomiting.   Oxcarbazepine (TRILEPTAL) 300 MG tablet TAKE 1 TABLET BY MOUTH TWICE A DAY   Potassium 99 MG TABS Take 99 mg by mouth daily.   Probiotic Product (PROBIOTIC DAILY) CAPS Take 1 capsule by mouth daily. Woman   triamcinolone cream (KENALOG) 0.1 % Apply 1 Application topically daily.   TURMERIC PO Take 600 mg by mouth daily.   valACYclovir (VALTREX) 500 MG tablet TAKE 1 TABLET BY MOUTH DAILY AS NEEDED (COLD SORES).   No facility-administered encounter medications on file as of 11/09/2022.     REVIEW OF SYSTEMS  : All other systems reviewed and negative except where noted in the History of Present Illness.   PHYSICAL EXAM: BP (!) 140/80   Pulse 79   Ht 5\' 1"  (1.549 m)   Wt 138 lb (62.6 kg)   BMI 26.07 kg/m  General: Well developed white female in no acute distress Head: Normocephalic and atraumatic Eyes:  Sclerae anicteric, conjunctiva pink. Ears: Normal  auditory acuity Lungs: Clear throughout to auscultation; no W/R/R. Heart: Regular rate and rhythm; no M/R/G. Abdomen: Soft, non-distended.  BS present and somewhat hyperactive.  Mild LLQ TTP. Musculoskeletal: Symmetrical with no gross deformities  Skin: No lesions on visible extremities Extremities: No edema  Neurological: Alert oriented x 4, grossly non-focal Psychological:  Alert and cooperative. Normal mood and affect  ASSESSMENT AND PLAN: *Cdiff infection:  Had a positive study in August but diarrhea reportedly resolved so she was not treated.  Diarrhea recurred and stool positive again on 10/1.  Treated with vancomycin for 10 days.  At this point she has been finished with treatment for a couple of weeks.  Diarrhea much improved, having some normal BMs.  Would not retest unless diarrhea significantly worsens again.  Can consider switching probiotic to Florastor for a month or so.  May take a couple weeks to couple months for stools to completely normalize again.  She will contact us with any worsening symptoms.  Need to be cautious with antibiotic use going forward.   CC:  Ardith Dark, MD

## 2022-11-09 NOTE — Progress Notes (Signed)
Attending Physician's Attestation   I have reviewed the chart.   I agree with the Advanced Practitioner's note, impression, and recommendations with any updates as below.    Emersyn Wyss Mansouraty, MD [facility] Office # [phone]  

## 2022-11-09 NOTE — Telephone Encounter (Signed)
See results note. 

## 2022-11-09 NOTE — Patient Instructions (Signed)
Start Florastor probiotic daily for at least a month.   Contact us with any worsening symptoms.  Follow up with Dr. Meridee Score on 02/07/23 @ 1:30 pm.  _______________________________________________________  If your blood pressure at your visit was 140/90 or greater, please contact your primary care physician to follow up on this.  _______________________________________________________  If you are age 84 or older, your body mass index should be between 23-30. Your Body mass index is 26.07 kg/m. If this is out of the aforementioned range listed, please consider follow up with your Primary Care Provider.  If you are age 67 or younger, your body mass index should be between 19-25. Your Body mass index is 26.07 kg/m. If this is out of the aformentioned range listed, please consider follow up with your Primary Care Provider.   ________________________________________________________  The [facility] providers would like to encourage you to use [facility] to communicate with providers for non-urgent requests or questions.  Due to long hold times on the telephone, sending your provider a message by [facility] may be a faster and more efficient way to get a response.  Please allow 48 business hours for a response.  Please remember that this is for non-urgent requests.  _______________________________________________________

## 2022-11-14 ENCOUNTER — Other Ambulatory Visit: Payer: Self-pay | Admitting: *Deleted

## 2022-11-14 ENCOUNTER — Telehealth: Payer: Self-pay | Admitting: Family Medicine

## 2022-11-14 MED ORDER — ONDANSETRON HCL 4 MG PO TABS: 4.0000 mg | ORAL_TABLET | Freq: Three times a day (TID) | ORAL | 0 refills | Status: AC | PRN

## 2022-11-14 NOTE — Telephone Encounter (Signed)
Prescription Request  11/14/2022  LOV: 10/25/2022  What is the name of the medication or equipment? ondansetron (ZOFRAN) 4 MG tablet   Have you contacted your pharmacy to request a refill? Yes   Which pharmacy would you like this sent to?  [facility] [place], [place] - [address] [facility] [address] [place] [address] Phone: [phone] Fax: [phone]    Patient notified that their request is being sent to the clinical staff for review and that they should receive a response within 2 business days.   Please advise at Mobile [phone] (mobile)

## 2022-11-14 NOTE — Telephone Encounter (Signed)
Rx send to [facility]

## 2022-11-19 ENCOUNTER — Telehealth: Payer: Self-pay | Admitting: Family Medicine

## 2022-11-19 NOTE — Telephone Encounter (Signed)
Patient advised See PCP Within 24 Hours  Patient declined to schedule an appointment any sooner/earlier than 11/23/22 with Dr. [name] at 10:20 am-Patient also declined to see any other Provider  Patient Name First: Joan Last: [name] Gender: Female DOB: 12-Dec-1940 Age: 82 Y 3 M 24 D Return Phone Number: [phone] (Primary) Address: City/ State/ Zip: [place]  [address] Client [facility], [facility] Provider Joan Mclaughlin- MD Contact Type Call Who Is Calling Patient / Member / Family / Caregiver Call Type Triage / Clinical Relationship To Patient Self Return Phone Number [phone] (Primary) Chief Complaint Back Injury Reason for Call Symptomatic / Request for Health Information Initial Comment Caller states she has rib pain from an inury. Translation No Nurse Assessment Nurse: Joan Rosenthal, RN, Joan Mclaughlin Date/Time (Eastern Time): 11/19/2022 12:01:56 PM Confirm and document reason for call. If symptomatic, describe symptoms. ---Caller states she fell about a week ago on a curb going into store, skinned knee and left side rib pain when sneezing or coughing pain goes to a 8/10 but minimal at rest. Does the patient have any new or worsening symptoms? ---Yes Will a triage be completed? ---Yes Related visit to physician within the last 2 weeks? ---No Does the PT have any chronic conditions? (i.e. diabetes, asthma, this includes High risk factors for pregnancy, etc.) ---Yes List chronic conditions. ---HTN seizures Is this a behavioral health or substance abuse call? ---No Guidelines Guideline Title Affirmed Question Affirmed Notes Nurse Date/Time (Eastern Time) Chest Injury - Bending Lifting or Twisting [1] MODERATE pain (e.g., interferes with normal activities) AND [2] high-risk adult (e.g., age > 60 years,  Guidelines Guideline Title Affirmed Question Affirmed Notes Nurse  Date/Time (Eastern Time) osteoporosis, chronic steroid use) Disp. Time Joan Mclaughlin Time) Disposition Final User 11/19/2022 12:09:29 PM See PCP within 24 Hours Yes Cruise, RN, Joan Mclaughlin Final Disposition 11/19/2022 12:09:29 PM See PCP within 24 Hours Yes Cruise, RN, Banker Understands Yes PreDisposition InappropriateToAsk Care Advice Given Per Guideline SEE PCP WITHIN 24 HOURS: * IF OFFICE WILL BE OPEN: You need to be examined within the next 24 hours. Call your doctor (or NP/PA) when the office opens and make an appointment. PAIN MEDICINES: * For pain relief, you can take either acetaminophen, ibuprofen, or naproxen. USE HEAT ON AREA AFTER 48 HOURS: * If pain, swelling, or bruising lasts more than 48 hours (2 days), then use heat on the area. * Use a heat pack, heating pad, or warm wet washcloth. * Do this for 10 minutes three times a day. * Caution: Avoid burn. Do not sleep on a heating pad. * This will help increase blood flow and improve healing. CALL BACK IF: * Severe pain lasts over 2 hours after pain medicine and ice * Difficulty breathing occurs * You become worse CARE ADVICE given per Chest Injury - Bending, Lifting, or Twisting (Adult) guideline. Comments User: Joan Beech, RN Date/Time Joan Mclaughlin Time): 11/19/2022 12:11:45 PM transferred to [facility] for appt request. Referrals REFERRED TO PCP OFFICE

## 2022-11-19 NOTE — Telephone Encounter (Signed)
FYI: This call has been transferred to triage nurse: Access Nurse. Once the result note has been entered staff can address the message at that time.  Patient called in with the following symptoms:  Red Word:fall  and rib pain x 1 week   Please advise at Mobile [phone] (mobile)  Message is routed to Provider Pool.

## 2022-11-20 NOTE — Telephone Encounter (Signed)
Patient has OV on 11/23/2022

## 2022-11-23 ENCOUNTER — Ambulatory Visit: Payer: Medicare Other | Admitting: Family Medicine

## 2022-11-26 ENCOUNTER — Encounter: Payer: Self-pay | Admitting: Family Medicine

## 2022-11-26 ENCOUNTER — Ambulatory Visit (INDEPENDENT_AMBULATORY_CARE_PROVIDER_SITE_OTHER): Payer: Medicare Other | Admitting: Family Medicine

## 2022-11-26 VITALS — BP 129/77 | HR 72 | Temp 98.4°F | Ht 61.0 in | Wt 139.0 lb

## 2022-11-26 DIAGNOSIS — I159 Secondary hypertension, unspecified: Secondary | ICD-10-CM

## 2022-11-26 DIAGNOSIS — K5790 Diverticulosis of intestine, part unspecified, without perforation or abscess without bleeding: Secondary | ICD-10-CM | POA: Diagnosis not present

## 2022-11-26 DIAGNOSIS — R197 Diarrhea, unspecified: Secondary | ICD-10-CM | POA: Diagnosis not present

## 2022-11-26 NOTE — Progress Notes (Signed)
   Joan Mclaughlin is a 82 y.o. female who presents today for an office visit.  Assessment/Plan:  New/Acute Problems: Fall No red flags.  Mechanical in nature.  We did discuss referral to PT however she declined.  She will let us know if she has any recurrent falls.  Chest Wall Pain Improved significantly.  Likely had mild rib contusion.  Given her degree of improvement thus far do not think that she had a fracture.  Anticipate this will fully heal up over the next several days.  She will let us know if she has any return or worsening symptoms.  Left Shoulder Pain Symptoms have resolved.  She will let us know if she has any recurrence.  Chronic Problems Addressed Today: Hypertension Blood pressure at goal on amlodipine 5 mg daily.  Diarrhea Resolved after treatment for her C. difficile with a course of vancomycin.  She has not had any recurrence for the last couple weeks.  She will follow up with gastroenterology if she has any recurrences.      Subjective:  HPI:  See Assessment / plan for status of chronic conditions.  Patient here today with right-sided rib pain.  She fell a couple of weeks ago after tripping on a curb.  Landed on the left side of her chest.  She had pain to the left side of her chest and left shoulder initially.  States that the fall was mechanical in nature.  Did not hit her head.  No loss of consciousness.  No weakness or numbness.  Pain has improved significantly over the last couple of weeks.  She did take Advil which helps.  She does still have mild pain with inspiration and sneezing but this is manageable.  Her left shoulder pain is resolved.   Since our last visit she has followed with gastroenterology for her diarrhea. This has improved significantly. She is now taking probiotic supplement.        Objective:  Physical Exam: BP 129/77   Pulse 72   Temp 98.4 F (36.9 C) (Temporal)   Ht 5\' 1"  (1.549 m)   Wt 139 lb (63 kg)   SpO2 97%   BMI 26.26  kg/m   Gen: No acute distress, resting comfortably CV: Regular rate and rhythm with no murmurs appreciated Pulm: Normal work of breathing, clear to auscultation bilaterally with no crackles, wheezes, or rhonchi Neuro: Grossly normal, moves all extremities Psych: Normal affect and thought content      Amarissa Koerner M. [name], MD 11/26/2022 12:12 PM

## 2022-11-26 NOTE — Assessment & Plan Note (Signed)
Resolved after treatment for her C. difficile with a course of vancomycin.  She has not had any recurrence for the last couple weeks.  She will follow up with gastroenterology if she has any recurrences.

## 2022-11-26 NOTE — Assessment & Plan Note (Signed)
Blood pressure at goal on amlodipine 5 mg daily. 

## 2022-11-26 NOTE — Patient Instructions (Signed)
It was very nice to see you today!  I am glad that you are feeling better!  Your exam today is normal.  Please let us know if you have any return of symptoms.  Return if symptoms worsen or fail to improve.   Take care, Dr [name]  PLEASE NOTE:  If you had any lab tests, please let us know if you have not heard back within a few days. You may see your results on mychart before we have a chance to review them but we will give you a call once they are reviewed by [place].   If we ordered any referrals today, please let us know if you have not heard from their office within the next week.   If you had any urgent prescriptions sent in today, please check with the pharmacy within an hour of our visit to make sure the prescription was transmitted appropriately.   Please try these tips to maintain a healthy lifestyle:  Eat at least 3 REAL meals and 1-2 snacks per day.  Aim for no more than 5 hours between eating.  If you eat breakfast, please do so within one hour of getting up.   Each meal should contain half fruits/vegetables, one quarter protein, and one quarter carbs (no bigger than a computer mouse)  Cut down on sweet beverages. This includes juice, soda, and sweet tea.   Drink at least 1 glass of water with each meal and aim for at least 8 glasses per day  Exercise at least 150 minutes every week.

## 2022-12-14 ENCOUNTER — Ambulatory Visit (INDEPENDENT_AMBULATORY_CARE_PROVIDER_SITE_OTHER): Payer: Medicare Other | Admitting: Family Medicine

## 2022-12-14 ENCOUNTER — Encounter: Payer: Self-pay | Admitting: Family Medicine

## 2022-12-14 VITALS — BP 134/74 | HR 74 | Temp 97.7°F | Ht 61.0 in | Wt 141.4 lb

## 2022-12-14 DIAGNOSIS — G4700 Insomnia, unspecified: Secondary | ICD-10-CM

## 2022-12-14 DIAGNOSIS — J029 Acute pharyngitis, unspecified: Secondary | ICD-10-CM | POA: Diagnosis not present

## 2022-12-14 DIAGNOSIS — I159 Secondary hypertension, unspecified: Secondary | ICD-10-CM

## 2022-12-14 LAB — POCT RAPID STREP A (OFFICE): Rapid Strep A Screen: POSITIVE — AB

## 2022-12-14 MED ORDER — AMLODIPINE BESYLATE 5 MG PO TABS: 5.0000 mg | ORAL_TABLET | Freq: Every day | ORAL | 1 refills | Status: DC

## 2022-12-14 MED ORDER — GABAPENTIN 100 MG PO CAPS: 100.0000 mg | ORAL_CAPSULE | Freq: Every day | ORAL | 3 refills | Status: DC

## 2022-12-14 MED ORDER — AZITHROMYCIN 250 MG PO TABS: ORAL_TABLET | ORAL | 0 refills | Status: DC

## 2022-12-14 NOTE — Patient Instructions (Signed)
It was very nice to see you today!  You have strep throat.  Please start the azithromycin.  I will refill your amlodipine.  Please try a gabapentin to help you with your sleep.  Sending message in a few weeks limit how this is working for you.  Return if symptoms worsen or fail to improve.   Take care, Dr [name]  PLEASE NOTE:  If you had any lab tests, please let us know if you have not heard back within a few days. You may see your results on mychart before we have a chance to review them but we will give you a call once they are reviewed by [place].   If we ordered any referrals today, please let us know if you have not heard from their office within the next week.   If you had any urgent prescriptions sent in today, please check with the pharmacy within an hour of our visit to make sure the prescription was transmitted appropriately.   Please try these tips to maintain a healthy lifestyle:  Eat at least 3 REAL meals and 1-2 snacks per day.  Aim for no more than 5 hours between eating.  If you eat breakfast, please do so within one hour of getting up.   Each meal should contain half fruits/vegetables, one quarter protein, and one quarter carbs (no bigger than a computer mouse)  Cut down on sweet beverages. This includes juice, soda, and sweet tea.   Drink at least 1 glass of water with each meal and aim for at least 8 glasses per day  Exercise at least 150 minutes every week.

## 2022-12-14 NOTE — Assessment & Plan Note (Signed)
Symptoms not controlled.  She has tried several medications in the past including Ambien, hydroxyzine, Remeron, and melatonin.  We discussed alternative treatment options.  Will try low-dose gabapentin 100 mg nightly.  Hopefully this will help some with her anxiety and pain symptoms as well.  We did discuss potential side effects.  She will follow-up with [place] in a few weeks via MyChart we can titrate the dose as needed.  If she does not do well with this would consider trial of doxepin.

## 2022-12-14 NOTE — Progress Notes (Signed)
   Joan Mclaughlin is a 82 y.o. female who presents today for an office visit.  Assessment/Plan:  New/Acute Problems: Sore throat Rapid strep positive.  Has Augmentin allergy.  It is not clear if she has done well with amoxicillin in the past thus we will start azithromycin.  Encouraged hydration.  She can use over-the-counter meds as needed.  We discussed reasons to return to care.  Follow-up as needed.  Chronic Problems Addressed Today: Insomnia Symptoms not controlled.  She has tried several medications in the past including Ambien, hydroxyzine, Remeron, and melatonin.  We discussed alternative treatment options.  Will try low-dose gabapentin 100 mg nightly.  Hopefully this will help some with her anxiety and pain symptoms as well.  We did discuss potential side effects.  She will follow-up with [place] in a few weeks via MyChart we can titrate the dose as needed.  If she does not do well with this would consider trial of doxepin.  Hypertension Blood pressure at goal today on amlodipine 5 mg daily.  Will refill today.     Subjective:  HPI:  See assessment / plan for status of chronic conditions. Her main concern today is sore throat.  Symptoms started about a week ago but worsened today. Some malaise. No fevers or chills.  She has been around her granddaughter who was sick as well.  She is has ongoing issues with difficulty sleeping.  She has tried several medications the past for this without much improvement including trazodone, hydroxyzine, mirtazapine, and melatonin.  Symptoms do seem to be getting worse.       Objective:  Physical Exam: BP 134/74   Pulse 74   Temp 97.7 F (36.5 C) (Temporal)   Ht 5\' 1"  (1.549 m)   Wt 141 lb 6.4 oz (64.1 kg)   SpO2 97%   BMI 26.72 kg/m   Gen: No acute distress, resting comfortably HEENT: OP erythematous. CV: Regular rate and rhythm with no murmurs appreciated Pulm: Normal work of breathing, clear to auscultation bilaterally with no  crackles, wheezes, or rhonchi Neuro: Grossly normal, moves all extremities Psych: Normal affect and thought content      Chaysen Tillman M. [name], MD 12/14/2022 2:59 PM

## 2022-12-14 NOTE — Assessment & Plan Note (Signed)
Blood pressure at goal today on amlodipine 5 mg daily.  Will refill today.

## 2022-12-20 ENCOUNTER — Encounter: Payer: Self-pay | Admitting: Family Medicine

## 2022-12-20 ENCOUNTER — Ambulatory Visit: Payer: Medicare Other | Admitting: Family Medicine

## 2022-12-20 VITALS — BP 135/75 | HR 77 | Temp 98.0°F | Ht 61.0 in | Wt 139.2 lb

## 2022-12-20 DIAGNOSIS — G4700 Insomnia, unspecified: Secondary | ICD-10-CM | POA: Diagnosis not present

## 2022-12-20 DIAGNOSIS — J020 Streptococcal pharyngitis: Secondary | ICD-10-CM | POA: Diagnosis not present

## 2022-12-20 MED ORDER — DOXEPIN HCL 10 MG PO CAPS: 10.0000 mg | ORAL_CAPSULE | Freq: Every evening | ORAL | 0 refills | Status: DC | PRN

## 2022-12-20 NOTE — Patient Instructions (Addendum)
It was very nice to see you today!  I am glad that your infection is improving.   Please start the doxepin to help with sleep.  Let me know in a week or 2 how you are doing.  Return if symptoms worsen or fail to improve.   Take care, Dr [name]  PLEASE NOTE:  If you had any lab tests, please let us know if you have not heard back within a few days. You may see your results on mychart before we have a chance to review them but we will give you a call once they are reviewed by [place].   If we ordered any referrals today, please let us know if you have not heard from their office within the next week.   If you had any urgent prescriptions sent in today, please check with the pharmacy within an hour of our visit to make sure the prescription was transmitted appropriately.   Please try these tips to maintain a healthy lifestyle:  Eat at least 3 REAL meals and 1-2 snacks per day.  Aim for no more than 5 hours between eating.  If you eat breakfast, please do so within one hour of getting up.   Each meal should contain half fruits/vegetables, one quarter protein, and one quarter carbs (no bigger than a computer mouse)  Cut down on sweet beverages. This includes juice, soda, and sweet tea.   Drink at least 1 glass of water with each meal and aim for at least 8 glasses per day  Exercise at least 150 minutes every week.

## 2022-12-20 NOTE — Assessment & Plan Note (Signed)
Symptoms are still not controlled.  She did not have any effect with lower dose of gabapentin 100 mg daily however felt like the 200 mg nightly dose was too strong.  She has tried and failed several medications in the past including hydroxyzine, trazodone, Ambien, Remeron, and melatonin.  We did discuss treatment options.  Will doxepin 10 mg nightly.  Discussed potential side effects.  She can follow-up with [place] in a few weeks.  If does not have any benefit with this would consider trial of Seroquel versus Belsomra.  May also consider trial of Lunesta at some point in future.  If this continues to be an ongoing issue would consider referral to psychiatry or sleep medicine.

## 2022-12-20 NOTE — Progress Notes (Signed)
   Joan Mclaughlin is a 82 y.o. female who presents today for an office visit.  Assessment/Plan:  New/Acute Problems: Strep Pharyngitis  Symptoms have resolved.  She finished her course of antibiotics. She will let us know if she has any recurrence.  Chronic Problems Addressed Today: Insomnia Symptoms are still not controlled.  She did not have any effect with lower dose of gabapentin 100 mg daily however felt like the 200 mg nightly dose was too strong.  She has tried and failed several medications in the past including hydroxyzine, trazodone, Ambien, Remeron, and melatonin.  We did discuss treatment options.  Will doxepin 10 mg nightly.  Discussed potential side effects.  She can follow-up with [place] in a few weeks.  If does not have any benefit with this would consider trial of Seroquel versus Belsomra.  May also consider trial of Lunesta at some point in future.  If this continues to be an ongoing issue would consider referral to psychiatry or sleep medicine.     Subjective:  HPI:  See Assessment / plan for status of chronic conditions.  Patient here today for follow-up.  We saw her 6 days ago with Sore throat.  Rapid strep at that time was positive.  We started azithromycin due to Augmentin allergy. This has improved the last couple of days and she is now back on his baseline.  At her last visit we also started her on gabapentin to help with her sleep.  She did 500 mg nightly dose however did not feel like this was improving her symptoms.  She then tried 200 mg.  This caused her to feel wobbly.  Melanite and additionally she felt groggy the next day.  She did this for a couple of days and then has since discontinued.       Objective:  Physical Exam: BP 135/75   Pulse 77   Temp 98 F (36.7 C) (Temporal)   Ht 5\' 1"  (1.549 m)   Wt 139 lb 3.2 oz (63.1 kg)   SpO2 97%   BMI 26.30 kg/m   Gen: No acute distress, resting comfortably CV: Regular rate and rhythm with no murmurs  appreciated Pulm: Normal work of breathing, clear to auscultation bilaterally with no crackles, wheezes, or rhonchi Neuro: Grossly normal, moves all extremities Psych: Normal affect and thought content      Hayde Kilgour M. [name], MD 12/20/2022 2:23 PM

## 2022-12-26 ENCOUNTER — Other Ambulatory Visit: Payer: Self-pay | Admitting: *Deleted

## 2022-12-26 ENCOUNTER — Telehealth: Payer: Self-pay | Admitting: *Deleted

## 2022-12-26 MED ORDER — KETOCONAZOLE 2 % EX SHAM: MEDICATED_SHAMPOO | CUTANEOUS | 5 refills | Status: DC

## 2022-12-26 NOTE — Telephone Encounter (Signed)
Spoke with patient, patient stated Rx was never order by PCP, patient was advise to schedule an office visit for rash, patient denied appt at this time  Stated Rx Doxepin 10mg  not working if is possible to up the dose  Please advise

## 2022-12-26 NOTE — Telephone Encounter (Signed)
Copied from CRM [id number]. Topic: Clinical - Medication Refill >> Dec 26, 2022 12:20 PM Conni Elliot wrote: Most Recent Primary Care Visit:  Provider: Ardith Dark  Department: [facility]  Visit Type: OFFICE VISIT  Date: 12/20/2022  Medication: clobetasol  Has the patient contacted their pharmacy? No (Agent: If no, request that the patient contact the pharmacy for the refill. If patient does not wish to contact the pharmacy document the reason why and proceed with request.) (Agent: If yes, when and what did the pharmacy advise?)  Is this the correct pharmacy for this prescription? Yes If no, delete pharmacy and type the correct one.  This is the patient's preferred pharmacy:  [facility] [place], [place] - [address] [place] [address] Phone: [phone] Fax: [phone]   Has the prescription been filled recently? No  Is the patient out of the medication? Yes  Has the patient been seen for an appointment in the last year OR does the patient have an upcoming appointment? Yes  Can we respond through MyChart? No  Agent: Please be advised that Rx refills may take up to 3 business days. We ask that you follow-up with your pharmacy.

## 2022-12-26 NOTE — Telephone Encounter (Signed)
Copied from CRM [id number]. Topic: Clinical - Medication Question >> Dec 26, 2022 12:21 PM Colletta Maryland S wrote: Reason for CRM: Pt was recently prescribed Doxepin rx, pt states rx is not working and is inquiring on higher dosage    Please advise

## 2022-12-27 NOTE — Telephone Encounter (Signed)
She can try increasing to 20 mg daily.  Recommend appointment here if still not improving with this.

## 2022-12-31 NOTE — Telephone Encounter (Signed)
Patient aware, will schedule an office visit if not improving

## 2023-01-07 ENCOUNTER — Other Ambulatory Visit: Payer: Self-pay | Admitting: *Deleted

## 2023-01-07 ENCOUNTER — Other Ambulatory Visit: Payer: Self-pay | Admitting: Family Medicine

## 2023-01-07 ENCOUNTER — Telehealth: Payer: Self-pay

## 2023-01-07 MED ORDER — CLOBETASOL PROPIONATE 0.05 % EX SOLN: 1.0000 | Freq: Two times a day (BID) | CUTANEOUS | 3 refills | Status: DC

## 2023-01-07 MED ORDER — DOXEPIN HCL 10 MG PO CAPS: 20.0000 mg | ORAL_CAPSULE | Freq: Every day | ORAL | 1 refills | Status: DC

## 2023-01-07 NOTE — Telephone Encounter (Signed)
Ok to increase to 20 mg nightly. She should follow up with [place] in a few weeks.  Katina Degree. [name], MD 01/07/2023 12:37 PM

## 2023-01-07 NOTE — Telephone Encounter (Signed)
Copied from CRM [id number]. Topic: Clinical - Medication Question >> Jan 07, 2023 11:44 AM Florestine Avers wrote: Reason for CRM: Was given doxepin (SINEQUAN) 10 MG capsule before the holidays, is requesting a larger dosage. Needs a higher dosage and would like additional refills. Is requesting Francena Hanly to call back.

## 2023-01-07 NOTE — Telephone Encounter (Signed)
Please see message. °

## 2023-01-07 NOTE — Telephone Encounter (Signed)
New Rx send to pharmacy

## 2023-01-07 NOTE — Telephone Encounter (Signed)
Copied from CRM [id number]. Topic: Clinical - Medication Refill >> Jan 07, 2023 11:49 AM Florestine Avers wrote: Most Recent Primary Care Visit:  Provider: Ardith Dark  Department: [facility]  Visit Type: OFFICE VISIT  Date: 12/20/2022  Medication: clobetasol scalp  Has the patient contacted their pharmacy? Yes (Agent: If no, request that the patient contact the pharmacy for the refill. If patient does not wish to contact the pharmacy document the reason why and proceed with request.) (Agent: If yes, when and what did the pharmacy advise?)  Is this the correct pharmacy for this prescription? Yes If no, delete pharmacy and type the correct one.  This is the patient's preferred pharmacy:  [facility] [place], [place] - [address] [place] [address] Phone: [phone] Fax: [phone]   Has the prescription been filled recently? No  Is the patient out of the medication? Yes  Has the patient been seen for an appointment in the last year OR does the patient have an upcoming appointment? Yes  Can we respond through MyChart? Yes  Agent: Please be advised that Rx refills may take up to 3 business days. We ask that you follow-up with your pharmacy.

## 2023-01-10 ENCOUNTER — Ambulatory Visit: Payer: Medicare Other | Admitting: Gastroenterology

## 2023-01-13 DIAGNOSIS — W268XXA Contact with other sharp object(s), not elsewhere classified, initial encounter: Secondary | ICD-10-CM | POA: Diagnosis not present

## 2023-01-13 DIAGNOSIS — S81812A Laceration without foreign body, left lower leg, initial encounter: Secondary | ICD-10-CM | POA: Diagnosis not present

## 2023-02-01 ENCOUNTER — Ambulatory Visit (INDEPENDENT_AMBULATORY_CARE_PROVIDER_SITE_OTHER): Payer: Medicare Other | Admitting: Family Medicine

## 2023-02-01 ENCOUNTER — Encounter: Payer: Self-pay | Admitting: Family Medicine

## 2023-02-01 VITALS — BP 123/76 | HR 75 | Temp 97.1°F | Ht 61.0 in | Wt 141.0 lb

## 2023-02-01 DIAGNOSIS — L989 Disorder of the skin and subcutaneous tissue, unspecified: Secondary | ICD-10-CM

## 2023-02-01 DIAGNOSIS — I159 Secondary hypertension, unspecified: Secondary | ICD-10-CM

## 2023-02-01 DIAGNOSIS — G4700 Insomnia, unspecified: Secondary | ICD-10-CM

## 2023-02-01 NOTE — Assessment & Plan Note (Signed)
Blood pressure at goal today on amlodipine 5 mg daily.

## 2023-02-01 NOTE — Assessment & Plan Note (Signed)
Symptoms are much better controlled on doxepin 20 mg nightly.  She is not having any significant side effects.  Tolerating well.  We will continue this current regimen for now.  She has tried and failed several other medications in the past.

## 2023-02-01 NOTE — Progress Notes (Signed)
   Joan Mclaughlin is a 83 y.o. female who presents today for an office visit.  Assessment/Plan:  New/Acute Problems: Skin Lesion  Concern for neoplasm.  We did discuss biopsy today for definitive diagnosis however she will be following up with her dermatologist soon.  They are planning on seeing her in a few weeks though she will call to see if they can see her sooner.  Will defer further management to dermatology though we did discuss reasons to return to care sooner.  She will let us know if she has any change in symptoms.  Chronic Problems Addressed Today: Insomnia Symptoms are much better controlled on doxepin 20 mg nightly.  She is not having any significant side effects.  Tolerating well.  We will continue this current regimen for now.  She has tried and failed several other medications in the past.  Hypertension Blood pressure at goal today on amlodipine 5 mg daily.      Subjective:  HPI:  See Assessment / plan for status of chronic conditions.   Patient here with concerning skin lesion.  Located on back of her right lower leg.  She has been there for several weeks.  She has been trying to clean the area with alcohol however symptoms have persisted.  She has tried to keep the area bandaged with topical mupirocin as well.  She does have a dermatologist however they cannot see her for a few more weeks.       Objective:  Physical Exam: BP 123/76   Pulse 75   Temp (!) 97.1 F (36.2 C) (Temporal)   Ht 5\' 1"  (1.549 m)   Wt 141 lb (64 kg)   SpO2 96%   BMI 26.64 kg/m   Gen: No acute distress, resting comfortably Skin: raised, firm Well-circumscribed approximately 1 cm nodular lesion with surrounding rolled erythema border on right posterior calf.  See below picture. Neuro: Grossly normal, moves all extremities Psych: Normal affect and thought content        Henry Utsey M. [name], MD 02/01/2023 1:21 PM

## 2023-02-01 NOTE — Patient Instructions (Addendum)
It was very nice to see you today!  I think you may have a skin cancer.  This needs to be biopsied.  Please follow-up with your dermatologist soon for this.  Return if symptoms worsen or fail to improve.   Take care, Dr [name]  PLEASE NOTE:  If you had any lab tests, please let us know if you have not heard back within a few days. You may see your results on mychart before we have a chance to review them but we will give you a call once they are reviewed by [place].   If we ordered any referrals today, please let us know if you have not heard from their office within the next week.   If you had any urgent prescriptions sent in today, please check with the pharmacy within an hour of our visit to make sure the prescription was transmitted appropriately.   Please try these tips to maintain a healthy lifestyle:  Eat at least 3 REAL meals and 1-2 snacks per day.  Aim for no more than 5 hours between eating.  If you eat breakfast, please do so within one hour of getting up.   Each meal should contain half fruits/vegetables, one quarter protein, and one quarter carbs (no bigger than a computer mouse)  Cut down on sweet beverages. This includes juice, soda, and sweet tea.   Drink at least 1 glass of water with each meal and aim for at least 8 glasses per day  Exercise at least 150 minutes every week.

## 2023-02-04 DIAGNOSIS — C44722 Squamous cell carcinoma of skin of right lower limb, including hip: Secondary | ICD-10-CM | POA: Diagnosis not present

## 2023-02-04 DIAGNOSIS — D485 Neoplasm of uncertain behavior of skin: Secondary | ICD-10-CM | POA: Diagnosis not present

## 2023-02-07 ENCOUNTER — Ambulatory Visit: Payer: Medicare Other | Admitting: Gastroenterology

## 2023-03-06 DIAGNOSIS — C44722 Squamous cell carcinoma of skin of right lower limb, including hip: Secondary | ICD-10-CM | POA: Diagnosis not present

## 2023-03-11 ENCOUNTER — Other Ambulatory Visit: Payer: Self-pay | Admitting: Family Medicine

## 2023-03-19 ENCOUNTER — Encounter: Payer: Self-pay | Admitting: Gastroenterology

## 2023-03-19 ENCOUNTER — Ambulatory Visit: Payer: Medicare Other | Admitting: Gastroenterology

## 2023-03-19 VITALS — BP 140/68 | HR 73 | Ht 62.0 in | Wt 141.0 lb

## 2023-03-19 DIAGNOSIS — Z860100 Personal history of colon polyps, unspecified: Secondary | ICD-10-CM

## 2023-03-19 DIAGNOSIS — Z860101 Personal history of adenomatous and serrated colon polyps: Secondary | ICD-10-CM

## 2023-03-19 DIAGNOSIS — Z09 Encounter for follow-up examination after completed treatment for conditions other than malignant neoplasm: Secondary | ICD-10-CM | POA: Diagnosis not present

## 2023-03-19 DIAGNOSIS — Z8619 Personal history of other infectious and parasitic diseases: Secondary | ICD-10-CM | POA: Diagnosis not present

## 2023-03-19 DIAGNOSIS — Z5320 Procedure and treatment not carried out because of patient's decision for unspecified reasons: Secondary | ICD-10-CM

## 2023-03-19 NOTE — Progress Notes (Unsigned)
 GASTROENTEROLOGY OUTPATIENT CLINIC VISIT   Primary Care Provider Ardith Dark, MD [address] [place] [address] [phone]  Referring Provider Dr. Marca Ancona & Dr. Randa Evens  Patient Profile: Joan Mclaughlin is a 83 y.o. female with a pmh significant for anxiety, allergies, status post cholecystectomy, diverticulosis (with prior diverticulitis), GERD, hyperlipidemia, seizures, colon polyps.  The patient presents to the [facility] for an evaluation and management of problem(s) noted below:  Problem List No diagnosis found.   History of Present Illness This is the patient's first visit to the outpatient [place] [facility] clinic.  She is followed by [facility] gastroenterology but has been referred for consideration of advanced polyp resection.  The patient initially underwent a colonoscopy in June 2020 with findings of a thickened fold at approximately 35 to 40 cm from insertion and biopsies returned showing tubular adenoma with a tattoo placed on the opposite wall.  A flexible sigmoidoscopy was then attempted in August however the preparation was poor as she only received enemas with the impression that this was a sigmoid colon polyp.  However it was felt at the time of this procedure that the polyp was in the distal transverse and preparation was poor.  The polyp was noted to be flat and approximately 2 cm in size occupying at least half the circumference of the fold.  Biopsies were obtained and it showed evidence of tubular adenoma.  The polyp was not resected completely.  It is for this reason that the patient has been referred to consider advanced endoscopic resection techniques.  Patient is otherwise other significant GI issues complaints.  She has not had any issues after her prior procedures  There is no family history of colon cancer.  GI Review of Systems Positive as above Negative for dysphagia, odynophagia, change in bowel habits, melena, hematochezia  Review  of Systems General: Denies fevers/chills/weight loss HEENT: Denies oral lesions Cardiovascular: Denies chest pain Pulmonary: Denies shortness of breath Gastroenterological: See HPI Genitourinary: Denies darkened urine Hematological: Denies easy bruising/bleeding Dermatological: Denies jaundice Psychological: Mood is stable   Medications Current Outpatient Medications  Medication Sig Dispense Refill   amLODipine (NORVASC) 5 MG tablet Take 1 tablet (5 mg total) by mouth daily. 90 tablet 1   Ascorbic Acid (VITAMIN C) 1000 MG tablet Take 1,000 mg by mouth daily.     b complex vitamins capsule Take 1 capsule by mouth daily.     CALCIUM-MAGNESIUM PO Take 1 tablet by mouth daily. 1000 mg / 500 mg     Cholecalciferol (VITAMIN D3) 50 MCG (2000 UT) capsule Take 2,000 Units by mouth daily.     clobetasol (TEMOVATE) 0.05 % external solution Apply 1 Application topically 2 (two) times daily. 50 mL 3   hydrocortisone 2.5 % cream Apply topically 2 (two) times daily as needed (Rash). (Patient taking differently: Apply 1 application  topically 2 (two) times daily as needed (Rosacea).) 30 g 11   hydrOXYzine (ATARAX) 10 MG tablet TAKE 1 TO 2 TABLETS (10-20 MG TOTAL) BY MOUTH AT BEDTIME 180 tablet 2   ketoconazole (NIZORAL) 2 % shampoo Use 2-3 times weekly as needed 120 mL 5   Lysine 500 MG TABS Take 500 mg by mouth daily.     MELATONIN PO Take 1 capsule by mouth at bedtime.     Multiple Vitamin (MULTIVITAMIN WITH MINERALS) TABS tablet Take 1 tablet by mouth daily.     Omega-3 Fatty Acids (FISH OIL PO) Take 630 mg by mouth daily.  omeprazole (PRILOSEC) 40 MG capsule Take 40 mg by mouth daily.     ondansetron (ZOFRAN) 4 MG tablet Take 1 tablet (4 mg total) by mouth every 8 (eight) hours as needed for nausea or vomiting. 20 tablet 0   Oxcarbazepine (TRILEPTAL) 300 MG tablet TAKE 1 TABLET BY MOUTH TWICE A DAY 180 tablet 3   Potassium 99 MG TABS Take 99 mg by mouth daily.     Probiotic Product (PROBIOTIC  DAILY) CAPS Take 1 capsule by mouth daily. Woman     triamcinolone cream (KENALOG) 0.1 % Apply 1 Application topically daily. 30 g 5   TURMERIC PO Take 600 mg by mouth daily.     valACYclovir (VALTREX) 500 MG tablet TAKE 1 TABLET BY MOUTH DAILY AS NEEDED (COLD SORES). 90 tablet 1   azithromycin (ZITHROMAX) 250 MG tablet Take 2 tabs day 1, then 1 tab daily (Patient not taking: Reported on 03/19/2023) 6 each 0   doxepin (SINEQUAN) 10 MG capsule Take 2 capsules (20 mg total) by mouth at bedtime. (Patient not taking: Reported on 03/19/2023) 60 capsule 1   gabapentin (NEURONTIN) 100 MG capsule Take 1 capsule (100 mg total) by mouth at bedtime. (Patient not taking: Reported on 03/19/2023) 30 capsule 3   No current facility-administered medications for this visit.    Allergies Allergies  Allergen Reactions   Amoxicillin-Pot Clavulanate Nausea And Vomiting    Patient denies knowledge of this allergy  Did it involve swelling of the face/tongue/throat, SOB, or low BP? No Did it involve sudden or severe rash/hives, skin peeling, or any reaction on the inside of your mouth or nose? No Did you need to seek medical attention at a hospital or doctor's office? No When did it last happen?      in 2016 If all above answers are "NO", may proceed with cephalosporin use.    Doxycycline Hives   Suvorexant Other (See Comments)    Nightmares     Histories Past Medical History:  Diagnosis Date   Alcoholism (HCC)    sober for 47 years-noted 09/16/2018   Allergy    Anxiety    Cancer (HCC)    skin cancer   Cholecystitis    Complication of anesthesia    Difficulty sleeping    takes med to sleep   Diverticulitis    Family history of adverse reaction to anesthesia    SIster - N/V   Gallstones    Ganglion cyst 12/11/2016   GERD (gastroesophageal reflux disease)    "RELATED TO GALLBLADDER"   Heart murmur    "ONLY DETECTED WHEN LYING DOWN"  - 10/16/2018- Not concerned with   History of Lyme disease  09/19/2016   Hyperlipidemia    Hypertension    Lyme disease    Pt states she has had Lyme Disease twice.    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/2018   RIGHT FOREARM    Squamous cell carcinoma of skin 09/30/2019   in situ-left lower leg-anterior   Trimalleolar fracture    left   Tubular adenoma of colon 06/27/2018   Past Surgical History:  Procedure Laterality Date   CHOLECYSTECTOMY N/A 12/23/2014   Procedure: LAPAROSCOPIC CHOLECYSTECTOMY WITH INTRAOPERATIVE CHOLANGIOGRAM;  Surgeon: Gaynelle Adu, MD;  Location: [facility] ORS;  Service: General;  Laterality: N/A;   COLONOSCOPY     "3 times trying to get large polyp since August 2020"   COLONOSCOPY WITH PROPOFOL N/A 10/20/2018   Procedure: COLONOSCOPY WITH PROPOFOL;  Surgeon: Meridee Score Netty Starring., MD;  Location: [facility] ENDOSCOPY;  Service: Gastroenterology;  Laterality: N/A;   COLONOSCOPY WITH PROPOFOL N/A 11/02/2019   Procedure: COLONOSCOPY WITH PROPOFOL;  Surgeon: Meridee Score Netty Starring., MD;  Location: [facility] ENDOSCOPY;  Service: Gastroenterology;  Laterality: N/A;   COLONOSCOPY WITH PROPOFOL N/A 08/31/2020   Procedure: COLONOSCOPY WITH PROPOFOL;  Surgeon: Meridee Score Netty Starring., MD;  Location: [facility] ENDOSCOPY;  Service: Gastroenterology;  Laterality: N/A;   ENDOSCOPIC MUCOSAL RESECTION N/A 10/20/2018   Procedure: ENDOSCOPIC MUCOSAL RESECTION;  Surgeon: Meridee Score Netty Starring., MD;  Location: [facility] ENDOSCOPY;  Service: Gastroenterology;  Laterality: N/A;   ENDOSCOPIC MUCOSAL RESECTION  11/02/2019   Procedure: ENDOSCOPIC MUCOSAL RESECTION;  Surgeon: Meridee Score Netty Starring., MD;  Location: [facility] ENDOSCOPY;  Service: Gastroenterology;;   HEMOSTASIS CLIP PLACEMENT  10/20/2018   Procedure: HEMOSTASIS CLIP PLACEMENT;  Surgeon:  Lemar Lofty., MD;  Location: [facility] ENDOSCOPY;  Service: Gastroenterology;;   HEMOSTASIS CLIP PLACEMENT  11/02/2019   Procedure: HEMOSTASIS CLIP PLACEMENT;  Surgeon: Lemar Lofty., MD;  Location: [facility] ENDOSCOPY;  Service: Gastroenterology;;   ORIF ANKLE FRACTURE Left 02/19/2019   Procedure: OPEN REDUCTION INTERNAL FIXATION (ORIF) LEFT ANKLE FRACTURE;  Surgeon: Terance Hart, MD;  Location: [facility] OR;  Service: Orthopedics;  Laterality: Left;   POLYPECTOMY  10/20/2018   Procedure: POLYPECTOMY;  Surgeon: Mansouraty, Netty Starring., MD;  Location: [facility] ENDOSCOPY;  Service: Gastroenterology;;   POLYPECTOMY  11/02/2019   Procedure: POLYPECTOMY;  Surgeon: Lemar Lofty., MD;  Location: [facility] ENDOSCOPY;  Service: Gastroenterology;;   POLYPECTOMY  08/31/2020   Procedure: POLYPECTOMY;  Surgeon: Lemar Lofty., MD;  Location: [facility] ENDOSCOPY;  Service: Gastroenterology;;   SUBMUCOSAL LIFTING INJECTION  10/20/2018   Procedure: SUBMUCOSAL LIFTING INJECTION;  Surgeon: Lemar Lofty., MD;  Location: [facility] ENDOSCOPY;  Service: Gastroenterology;;   SYNDESMOSIS REPAIR Left 02/19/2019   Procedure: SYNDESMOSIS REPAIR;  Surgeon: Terance Hart, MD;  Location: [facility] OR;  Service: Orthopedics;  Laterality: Left;   THORACIC DISCECTOMY  07/2007   T12   TUBAL LIGATION     WISDOM TOOTH EXTRACTION     Social History   Socioeconomic History   Marital status: Single    Spouse name: Not on file   Number of children: 4   Years of education: 14   Highest education level: Not on file  Occupational History   Occupation: retired    Comment: Midwife payable and nanny   Tobacco Use   Smoking status: Never   Smokeless tobacco: Never  Vaping Use   Vaping status: Never Used  Substance and Sexual Activity   Alcohol use: No    Alcohol/week: 0.0 standard drinks of alcohol    Comment: sober for 47 years-noted 09/16/2018   Drug use: No   Sexual activity: Not Currently  Other Topics Concern    Not on file  Social History Narrative   Patient lives alone.   patient has 4 grandchildren.   Patient is retired   Patient has 2 years of college.   Patient is right handed.         Social Drivers of Health   Financial Resource Strain: Low Risk  (07/19/2022)   Overall Financial Resource Strain (CARDIA)    Difficulty of Paying Living Expenses: Not  hard at all  Food Insecurity: No Food Insecurity (07/19/2022)   Hunger Vital Sign    Worried About Running Out of Food in the Last Year: Never true    Ran Out of Food in the Last Year: Never true  Transportation Needs: No Transportation Needs (07/19/2022)   PRAPARE - Administrator, Civil Service (Medical): No    Lack of Transportation (Non-Medical): No  Physical Activity: Insufficiently Active (07/19/2022)   Exercise Vital Sign    Days of Exercise per Week: 1 day    Minutes of Exercise per Session: 60 min  Stress: No Stress Concern Present (07/19/2022)   [employer] of Occupational Health - Occupational Stress Questionnaire    Feeling of Stress : Not at all  Social Connections: Moderately Integrated (07/19/2022)   Social Connection and Isolation Panel [NHANES]    Frequency of Communication with Friends and Family: More than three times a week    Frequency of Social Gatherings with Friends and Family: More than three times a week    Attends Religious Services: More than 4 times per year    Active Member of [employer] or Organizations: Yes    Attends Banker Meetings: 1 to 4 times per year    Marital Status: Never married  Intimate Partner Violence: Not At Risk (07/19/2022)   Humiliation, Afraid, Rape, and Kick questionnaire    Fear of Current or Ex-Partner: No    Emotionally Abused: No    Physically Abused: No    Sexually Abused: No   Family History  Problem Relation Age of Onset   Arthritis Father    Alcohol abuse Mother        drinker and smoker   Diabetes Mother    Heart disease Mother     Hypertension Mother    Stroke Mother    Depression Sister    Hearing loss Sister    Hyperlipidemia Sister    Alcohol abuse Daughter    Alcohol abuse Son    Depression Sister    Early death Sister    Alcohol abuse Brother    Depression Sister    Heart disease Sister    Colon cancer Neg Hx    Esophageal cancer Neg Hx    Inflammatory bowel disease Neg Hx    Liver disease Neg Hx    Pancreatic cancer Neg Hx    Rectal cancer Neg Hx    Stomach cancer Neg Hx    I have reviewed her medical, social, and family history in detail and updated the electronic medical record as necessary.   PHYSICAL EXAMINATION  BP (!) 140/68   Pulse 73   Ht 5\' 2"  (1.575 m)   Wt 141 lb (64 kg)   BMI 25.79 kg/m  Wt Readings from Last 3 Encounters:  03/19/23 141 lb (64 kg)  02/01/23 141 lb (64 kg)  12/20/22 139 lb 3.2 oz (63.1 kg)  GEN: NAD, appears stated age, doesn't appear chronically ill PSYCH: Cooperative, without pressured speech EYE: Conjunctivae pink, sclerae anicteric ENT: MMM, without oral ulcers, no erythema or exudates noted NECK: Supple CV: RR without R/Gs  RESP: CTAB posteriorly, without wheezing GI: NABS, soft, NT/ND, without rebound or guarding, no HSM appreciated MSK/EXT: No lower extremity edema SKIN: No jaundice NEURO:  Alert & Oriented x 3, no focal deficits   REVIEW OF DATA  I reviewed the following data at the time of this encounter:  GI Procedures and Studies  Equal colonoscopies from 2020 will need  to be obtained Review of what was discussed with me previously was a June 2020 colonoscopy showing thickened fold at 35 to 40 cm which was biopsied and returned as adenoma and subsequent August 2020 sigmoidoscopy consistent with a distal transverse colon polyp that was biopsied showing evidence of adenoma.    Laboratory Studies  Reviewed those in epic  Imaging Studies  No relevant studies to review   ASSESSMENT  Ms. Toda is a 83 y.o. female with a pmh significant for  anxiety, allergies, status post cholecystectomy, diverticulosis (with prior diverticulitis), GERD, hyperlipidemia, seizures, colon polyps.  The patient is seen today for evaluation and management of:  No diagnosis found.  Based upon the description (although I have not seen the endoscopic pictures and we will work on trying to obtain) I do feel that it is reasonable to pursue an Advanced Polypectomy attempt of the polyp/lesion.  We discussed some of the techniques of advanced polypectomy which include Endoscopic Mucosal Resection, OVESCO Full-Thickness Resection, Endorotor Morcellation, and Tissue Ablation via Fulguration.  The risks and benefits of endoscopic evaluation were discussed with the patient; these include but are not limited to the risk of perforation, infection, bleeding, missed lesions, lack of diagnosis, severe illness requiring hospitalization, as well as anesthesia and sedation related illnesses.  During attempts at advanced polypectomy, the risks of bleeding and perforation/leak are increased as opposed to diagnostic and screening colonoscopies, and that was discussed with the patient as well.   In addition, I explained that with the possible need for piecemeal resection, subsequent short-interval endoscopic evaluation for follow up and potential retreatment of the lesion/area may be necessary.  I did offer, a referral to surgery in order for patient to have opportunity to discuss surgical management/intervention prior to finalizing decision for attempt at endoscopic removal, however, the patient deferred on this.  If, after attempt at removal of the polyp, it is found that the patient has a complication or that an invasive lesion or malignant lesion is found, or that the polyp continues to recur, the patient is aware and understands that surgery may still be indicated/required.  All patient questions were answered, to the best of my ability, and the patient agrees to the aforementioned plan of  action with follow-up as indicated.   PLAN  Laboratories as outlined below Obtain lower endoscopy reports from [facility] from June and August (sigmoidoscopy) Proceed with scheduling colonoscopy with EMR (have Endo Roeder potentially available)   No orders of the defined types were placed in this encounter.   New Prescriptions   No medications on file   Modified Medications   No medications on file    Planned Follow Up No follow-ups on file.   Corliss Parish, MD [facility] Advanced Endoscopy Office # [phone]

## 2023-03-19 NOTE — Patient Instructions (Signed)
 Follow-up as needed  _______________________________________________________  If your blood pressure at your visit was 140/90 or greater, please contact your primary care physician to follow up on this.  _______________________________________________________  If you are age 85 or older, your body mass index should be between 23-30. Your Body mass index is 25.79 kg/m. If this is out of the aforementioned range listed, please consider follow up with your Primary Care Provider.  If you are age 20 or younger, your body mass index should be between 19-25. Your Body mass index is 25.79 kg/m. If this is out of the aformentioned range listed, please consider follow up with your Primary Care Provider.   ________________________________________________________  The [facility] providers would like to encourage you to use [facility] to communicate with providers for non-urgent requests or questions.  Due to long hold times on the telephone, sending your provider a message by [facility] may be a faster and more efficient way to get a response.  Please allow 48 business hours for a response.  Please remember that this is for non-urgent requests.  _______________________________________________________  Thank you for choosing me and [facility].  Dr. Meridee Score

## 2023-03-20 DIAGNOSIS — L719 Rosacea, unspecified: Secondary | ICD-10-CM | POA: Diagnosis not present

## 2023-03-20 DIAGNOSIS — Z4802 Encounter for removal of sutures: Secondary | ICD-10-CM | POA: Diagnosis not present

## 2023-03-22 ENCOUNTER — Encounter: Payer: Self-pay | Admitting: Gastroenterology

## 2023-03-22 DIAGNOSIS — Z8619 Personal history of other infectious and parasitic diseases: Secondary | ICD-10-CM | POA: Insufficient documentation

## 2023-03-22 DIAGNOSIS — Z5320 Procedure and treatment not carried out because of patient's decision for unspecified reasons: Secondary | ICD-10-CM | POA: Insufficient documentation

## 2023-03-22 DIAGNOSIS — Z860101 Personal history of adenomatous and serrated colon polyps: Secondary | ICD-10-CM | POA: Insufficient documentation

## 2023-04-02 ENCOUNTER — Ambulatory Visit: Admitting: Dermatology

## 2023-04-02 ENCOUNTER — Encounter: Payer: Self-pay | Admitting: Dermatology

## 2023-04-02 DIAGNOSIS — Z85828 Personal history of other malignant neoplasm of skin: Secondary | ICD-10-CM

## 2023-04-02 DIAGNOSIS — L570 Actinic keratosis: Secondary | ICD-10-CM | POA: Diagnosis not present

## 2023-04-02 DIAGNOSIS — L219 Seborrheic dermatitis, unspecified: Secondary | ICD-10-CM | POA: Diagnosis not present

## 2023-04-02 DIAGNOSIS — L821 Other seborrheic keratosis: Secondary | ICD-10-CM | POA: Diagnosis not present

## 2023-04-02 DIAGNOSIS — L719 Rosacea, unspecified: Secondary | ICD-10-CM | POA: Diagnosis not present

## 2023-04-02 MED ORDER — CLOBETASOL PROPIONATE 0.05 % EX SOLN: 1.0000 | Freq: Two times a day (BID) | CUTANEOUS | 3 refills | Status: AC

## 2023-04-02 NOTE — Progress Notes (Signed)
    New Patient Visit   Subjective  Joan Mclaughlin is a 83 y.o. female who presents for the following: spot concerns  Patient states she has spots located at the generalized that she would like to have examined. Patient reports the areas have been there for 4 weeks. She reports the areas are not bothersome.Patient rates irritation 2 out of 10. She states that the areas have not spread. Patient reports she has not previously been treated for these areas. Patient has had a SCC right calf and had removed about 5 weeks ago.     The following portions of the chart were reviewed this encounter and updated as appropriate: medications, allergies, medical history  Review of Systems:  No other skin or systemic complaints except as noted in HPI or Assessment and Plan.  Objective  Well appearing patient in no apparent distress; mood and affect are within normal limits.   A focused examination was performed of the following areas: spot on face and leg    Relevant exam findings are noted in the Assessment and Plan.    Assessment & Plan   1. Actinic Keratosis - Assessment: Multiple actinic keratoses on various body parts, including the leg, neck, and jawline. These lesions are precancerous and have the potential to develop into squamous cell carcinoma if left untreated. - Plan:    Cryotherapy with liquid nitrogen applied to affected areas    Patient instructed to apply Aquaphor to treated areas morning and night    Follow-up appointment scheduled for end of July or early August for full body skin check  2. Seborrheic Dermatitis - Assessment: Patient reports itching all over, particularly on the scalp and face. Clinical examination confirms seborrheic dermatitis. - Plan:    Prescribed clobetasol 0.05% solution for scalp application once daily    Recommended CeraVe zinc-based shampoo (samples provided)    Advised to continue using Nizoral shampoo in combination with CeraVe    Prescribed  triamcinolone 0.1% cream for hairline and affected areas, to be applied for 7-10 days    Patient educated on potential flare triggers such as stress and weather changes  3. Rosacea - Assessment: Patient reports ongoing rosacea symptoms, including facial redness and flaking skin. Currently using Metrocream as prescribed by previous dermatologist. - Plan:    Continue Metrocream as prescribed    Recommended La Roche-Posay Toleriane Double Repair Moisturizer (samples provided)    Advised application of hyaluronic acid before moisturizer    Instructed on proper order of application: Metrocream, hyaluronic acid, then moisturizer  4. Seborrheic Keratosis - Assessment: Patient presents with a seborrheic keratosis, described as a benign growth associated with aging. - Plan:    Prescribed Lipker moisturizer containing urea for daily application    Patient educated on the benign nature of the lesion and its association with aging  5. Post-operative Follow-up: Squamous Cell Carcinoma - Assessment: Patient underwent surgery for squamous cell carcinoma on the leg approximately 4-5 weeks ago. Examination shows good healing with no signs of recurrence. - Plan:    Continue monitoring for any signs of recurrence during future skin checks     No follow-ups on file.  Samara Deist, CMA, am acting as scribe for [employer], DO.   Documentation: I have reviewed the above documentation for accuracy and completeness, and I agree with the above.  Langston Reusing, DO

## 2023-04-02 NOTE — Patient Instructions (Addendum)
 Hello Taejah,  Thank you for visiting today. Here is a summary of the key instructions:  - In Office Treatments Performed:   - Apply liquid nitrogen to freeze actinic keratoses on various areas   - Use Aquaphor on all treated spots     - Apply a little bit in the morning and at night     - Continue normal face washing, then apply Aquaphor before regular skincare  - Medications:   - Apply clobetasol 0.05% solution to affected areas on the scalp once a day   - Use triamcinolone 0.1% cream on hairline and affected areas for 7 to 10 days   - Continue using Metrocream for rosacea as prescribed  - Skincare:   - Use CeraVe zinc-based shampoo along with Nizoral shampoo   - Apply hyaluronic acid to face, then La Roche-Posay Toleriane Double Repair Moisturizer   - Use Lipker moisturizer with urea daily on body  - Skincare Routine for Face:   1. Apply Metrocream   2. Apply hyaluronic acid   3. Apply moisturizer  - Follow-up:   - Schedule full body skin check appointment for late July or early August  Please reach out if you have any questions or concerns.  Warm regards,  Dr. [facility] Important Information  Due to recent changes in healthcare laws, you may see results of your pathology and/or laboratory studies on MyChart before the doctors have had a chance to review them. We understand that in some cases there may be results that are confusing or concerning to you. Please understand that not all results are received at the same time and often the doctors may need to interpret multiple results in order to provide you with the best plan of care or course of treatment. Therefore, we ask that you please give [place] 2 business days to thoroughly review all your results before contacting the office for clarification. Should we see a critical lab result, you will be contacted sooner.   If You Need Anything After Your Visit  If you have any questions or concerns for your doctor,  please call our main line at [phone] If no one answers, please leave a voicemail as directed and we will return your call as soon as possible. Messages left after 4 pm will be answered the following business day.   You may also send [place] a message via MyChart. We typically respond to MyChart messages within 1-2 business days.  For prescription refills, please ask your pharmacy to contact our office. Our fax number is [phone].  If you have an urgent issue when the clinic is closed that cannot wait until the next business day, you can page your doctor at the number below.    Please note that while we do our best to be available for urgent issues outside of office hours, we are not available 24/7.   If you have an urgent issue and are unable to reach [place], you may choose to seek medical care at your doctor's office, retail clinic, urgent care center, or emergency room.  If you have a medical emergency, please immediately call 911 or go to the emergency department. In the event of inclement weather, please call our main line at [phone] for an update on the status of any delays or closures.  Dermatology Medication Tips: Please keep the boxes that topical medications come in in order to help keep track of the instructions about where and how to use these. Pharmacies typically print the  medication instructions only on the boxes and not directly on the medication tubes.   If your medication is too expensive, please contact our office at [phone] or send [place] a message through MyChart.   We are unable to tell what your co-pay for medications will be in advance as this is different depending on your insurance coverage. However, we may be able to find a substitute medication at lower cost or fill out paperwork to get insurance to cover a needed medication.   If a prior authorization is required to get your medication covered by your insurance company, please allow [place] 1-2 business days to  complete this process.  Drug prices often vary depending on where the prescription is filled and some pharmacies may offer cheaper prices.  The website www.goodrx.com contains coupons for medications through different pharmacies. The prices here do not account for what the cost may be with help from insurance (it may be cheaper with your insurance), but the website can give you the price if you did not use any insurance.  - You can print the associated coupon and take it with your prescription to the pharmacy.  - You may also stop by our office during regular business hours and pick up a GoodRx coupon card.  - If you need your prescription sent electronically to a different pharmacy, notify our office through [facility] or by phone at [phone]

## 2023-04-03 ENCOUNTER — Other Ambulatory Visit: Payer: Self-pay

## 2023-04-03 ENCOUNTER — Telehealth: Payer: Self-pay | Admitting: *Deleted

## 2023-04-03 NOTE — Telephone Encounter (Signed)
 Copied from CRM [id number]. Topic: Clinical - Medication Question >> Apr 03, 2023  9:20 AM Sim Boast F wrote: Reason for CRM: Patient would like to know if her blood pressure medication needs to be increased? Says it was up to 200 at visit with another provider

## 2023-04-04 ENCOUNTER — Ambulatory Visit: Payer: Medicare Other | Admitting: Dermatology

## 2023-04-04 ENCOUNTER — Other Ambulatory Visit: Payer: Self-pay | Admitting: *Deleted

## 2023-04-04 MED ORDER — BLOOD PRESSURE MONITORING KIT: PACK | 0 refills | Status: DC

## 2023-04-04 NOTE — Telephone Encounter (Signed)
 Spoke with patient BP monitor order  Advise to monitor BP at home and let us know if persistently elevated  over 140/90  schedule a visit with PCP  Patient verbalized understanding

## 2023-04-04 NOTE — Telephone Encounter (Signed)
 Her blood pressure was at goal here.  Recommend she monitor at home and let us know if persistently elevated or schedule an appointment for [place] to check here.  Would not make any medication changes at this point unless her readings are persistently elevated.

## 2023-04-05 ENCOUNTER — Ambulatory Visit: Admitting: Family Medicine

## 2023-05-03 ENCOUNTER — Encounter: Payer: Self-pay | Admitting: Family Medicine

## 2023-05-03 ENCOUNTER — Ambulatory Visit (INDEPENDENT_AMBULATORY_CARE_PROVIDER_SITE_OTHER): Admitting: Family Medicine

## 2023-05-03 VITALS — BP 133/72 | HR 74 | Temp 97.3°F | Ht 62.0 in | Wt 139.6 lb

## 2023-05-03 DIAGNOSIS — R159 Full incontinence of feces: Secondary | ICD-10-CM | POA: Diagnosis not present

## 2023-05-03 DIAGNOSIS — I159 Secondary hypertension, unspecified: Secondary | ICD-10-CM

## 2023-05-03 DIAGNOSIS — N390 Urinary tract infection, site not specified: Secondary | ICD-10-CM | POA: Diagnosis not present

## 2023-05-03 LAB — POCT URINALYSIS DIPSTICK
Bilirubin, UA: NEGATIVE
Blood, UA: NEGATIVE
Glucose, UA: NEGATIVE
Ketones, UA: NEGATIVE
Nitrite, UA: NEGATIVE
Protein, UA: POSITIVE — AB
Spec Grav, UA: >=1.03 — AB (ref 1.010–1.025)
Urobilinogen, UA: 2 U/dL
pH, UA: 5.5 (ref 5.0–8.0)

## 2023-05-03 MED ORDER — NITROFURANTOIN MONOHYD MACRO 100 MG PO CAPS: 100.0000 mg | ORAL_CAPSULE | Freq: Two times a day (BID) | ORAL | 0 refills | Status: DC

## 2023-05-03 NOTE — Patient Instructions (Signed)
 It was very nice to see you today!  I think you probably have a UTI.  Start the Macrobid .  Make sure that you are getting plenty of fluids.  We will call you once we get results back on your urine culture.  Please let us  know if you have any worsening symptoms.  Return if symptoms worsen or fail to improve.   Take care, Dr Daneil Dunker  PLEASE NOTE:  If you had any lab tests, please let us  know if you have not heard back within a few days. You may see your results on mychart before we have a chance to review them but we will give you a call once they are reviewed by us .   If we ordered any referrals today, please let us  know if you have not heard from their office within the next week.   If you had any urgent prescriptions sent in today, please check with the pharmacy within an hour of our visit to make sure the prescription was transmitted appropriately.   Please try these tips to maintain a healthy lifestyle:  Eat at least 3 REAL meals and 1-2 snacks per day.  Aim for no more than 5 hours between eating.  If you eat breakfast, please do so within one hour of getting up.   Each meal should contain half fruits/vegetables, one quarter protein, and one quarter carbs (no bigger than a computer mouse)  Cut down on sweet beverages. This includes juice, soda, and sweet tea.   Drink at least 1 glass of water with each meal and aim for at least 8 glasses per day  Exercise at least 150 minutes every week.

## 2023-05-03 NOTE — Progress Notes (Signed)
   Joan Mclaughlin is a 83 y.o. female who presents today for an office visit.  Assessment/Plan:  New/Acute Problems: Urinary urgency No red flags.  Concern for UTI based on UA and history.  She did take a couple of doses of ciprofloxacin .  Discussed with patient that this may skew her testing results however given that she has had some improvement with is it is likely that her symptoms are due to UTI.  Will empirically start Macrobid  100 mg twice daily until we get her culture results back.  Encouraged hydration.  We discussed reasons to return to care and seek emergent care.  Chronic Problems Addressed Today: Hypertension Blood pressure at goal today on amlodipine  5 mg daily.  She did have a couple of elevated readings a couple of days ago but this is since normalized.  We discussed proper monitoring technique.  She will monitor at home and let us  know if persistently elevated.  She can bring blood pressure cuff to her next office visit here.     Subjective:  HPI:  See assessment / plan for status of chronic conditions. She is here today with urinary urgency and incontinence. This started about 5 days ago. Getting worse the last few days.  No fevers or chills.  No dysuria.  She was concerned about UTI and found leftover ciprofloxacin  yesterday.  She took a dose yesterday and a dose this morning.  Symptoms have begun to improve       Objective:  Physical Exam: BP 133/72   Pulse 74   Temp (!) 97.3 F (36.3 C) (Temporal)   Ht 5\' 2"  (1.575 m)   Wt 139 lb 9.6 oz (63.3 kg)   SpO2 98%   BMI 25.53 kg/m   Gen: No acute distress, resting comfortably Neuro: Grossly normal, moves all extremities Psych: Normal affect and thought content      [name] M. Daneil Dunker, MD 05/03/2023 2:54 PM

## 2023-05-03 NOTE — Assessment & Plan Note (Signed)
 Blood pressure at goal today on amlodipine  5 mg daily.  She did have a couple of elevated readings a couple of days ago but this is since normalized.  We discussed proper monitoring technique.  She will monitor at home and let us  know if persistently elevated.  She can bring blood pressure cuff to her next office visit here.

## 2023-05-04 LAB — URINE CULTURE
MICRO NUMBER:: 16376611
Result:: NO GROWTH
SPECIMEN QUALITY:: ADEQUATE

## 2023-05-06 NOTE — Progress Notes (Signed)
 Urine culture is negative however as we discussed at her office visit, this may be skewed by her recent antibiotic use.  Recommend she complete her course of Macrobid  and let us  know if symptoms are not improving.

## 2023-05-11 ENCOUNTER — Other Ambulatory Visit: Payer: Self-pay | Admitting: Family Medicine

## 2023-06-16 DIAGNOSIS — N3001 Acute cystitis with hematuria: Secondary | ICD-10-CM | POA: Diagnosis not present

## 2023-07-10 ENCOUNTER — Other Ambulatory Visit: Payer: Self-pay | Admitting: Family Medicine

## 2023-07-22 ENCOUNTER — Encounter: Payer: Self-pay | Admitting: Family Medicine

## 2023-07-22 ENCOUNTER — Ambulatory Visit (INDEPENDENT_AMBULATORY_CARE_PROVIDER_SITE_OTHER): Admitting: Family Medicine

## 2023-07-22 VITALS — BP 132/72 | HR 68 | Temp 97.3°F | Ht 62.0 in | Wt 141.2 lb

## 2023-07-22 DIAGNOSIS — R197 Diarrhea, unspecified: Secondary | ICD-10-CM

## 2023-07-22 DIAGNOSIS — I159 Secondary hypertension, unspecified: Secondary | ICD-10-CM

## 2023-07-22 DIAGNOSIS — K5790 Diverticulosis of intestine, part unspecified, without perforation or abscess without bleeding: Secondary | ICD-10-CM | POA: Diagnosis not present

## 2023-07-22 MED ORDER — METRONIDAZOLE 500 MG PO TABS: 500.0000 mg | ORAL_TABLET | Freq: Three times a day (TID) | ORAL | 0 refills | Status: AC

## 2023-07-22 MED ORDER — CIPROFLOXACIN HCL 500 MG PO TABS: 500.0000 mg | ORAL_TABLET | Freq: Two times a day (BID) | ORAL | 0 refills | Status: AC

## 2023-07-22 MED ORDER — HYDROXYZINE HCL 10 MG PO TABS: ORAL_TABLET | ORAL | 2 refills | Status: AC

## 2023-07-22 NOTE — Assessment & Plan Note (Signed)
 Patient did have diverticulitis flare a couple of years ago however current symptoms are not consistent with this.  Will be sending in pocket prescription for antibiotics as above though she will let us  know if symptoms do not improve.

## 2023-07-22 NOTE — Progress Notes (Signed)
   Joan Mclaughlin is a 83 y.o. female who presents today for an office visit.  Assessment/Plan:  New/Acute Problems: Diarrhea  No red flags.  Overall reassuring exam.  History is not consistent with C. difficile infection.  She does have a history of diverticulosis however not currently having abdominal pain or any other signs or symptoms of diverticulitis flare.  We did discuss other potential etiologies including food intolerance or mild viral illness.  She has had some improvement with Imodium .  We recommended she continue with bland diet and good hydration.  It is okay for her to continue Imodium .  I will send a pocket prescription in for Cipro  and Flagyl  however she will not start unless symptoms fail to improve over the next few days.  Encouraged hydration.  Discussed reasons to return to care.  Follow-up as needed.  Chronic Problems Addressed Today: Hypertension At goal today on amlodipine  5 mg daily.  Diverticulosis Patient did have diverticulitis flare a couple of years ago however current symptoms are not consistent with this.  Will be sending in pocket prescription for antibiotics as above though she will let us  know if symptoms do not improve.     Subjective:  HPI:  See Assessment / plan f okay for or status of chronic conditions. Patient is here with diarrhea since yesterday.  She was treated with an antibiotic at urgent in care in [place] . She believes that she was given cephalexin  for this. She is concerned about potential C diff. She had 4-5 episodes of diarrhea yesterday. She has not had much abdominal pain. Some subjecrive fevers. No chills. Took imodium  with some improvement. Had a loose bowel movement this morning. No abdominal pain.  No sick contacts.       Objective:  Physical Exam: BP 132/72   Pulse 68   Temp (!) 97.3 F (36.3 C) (Temporal)   Ht 5' 2 (1.575 m)   Wt 141 lb 3.2 oz (64 kg)   SpO2 98%   BMI 25.83 kg/m   Gen: No acute distress, resting  comfortably CV: Regular rate and rhythm with no murmurs appreciated Pulm: Normal work of breathing, clear to auscultation bilaterally with no crackles, wheezes, or rhonchi Abdomen: Bowel sounds present, soft, nontender, nondistended. Neuro: Grossly normal, moves all extremities Psych: Normal affect and thought content      Brelan Hannen M. Kennyth, MD 07/22/2023 1:24 PM

## 2023-07-22 NOTE — Assessment & Plan Note (Signed)
At goal today on amlodipine 5 mg daily. 

## 2023-07-22 NOTE — Patient Instructions (Signed)
 It was very nice to see you today!  It is you can continue using Imodium .  Please make sure that you are getting plenty of fluids and staying hydrated.  We will send a prescription in for antibiotics.  Please start these if your symptoms worsen or fail to improve in a few days.  Return if symptoms worsen or fail to improve.   Take care, Dr Kennyth  PLEASE NOTE:  If you had any lab tests, please let us  know if you have not heard back within a few days. You may see your results on mychart before we have a chance to review them but we will give you a call once they are reviewed by us .   If we ordered any referrals today, please let us  know if you have not heard from their office within the next week.   If you had any urgent prescriptions sent in today, please check with the pharmacy within an hour of our visit to make sure the prescription was transmitted appropriately.   Please try these tips to maintain a healthy lifestyle:  Eat at least 3 REAL meals and 1-2 snacks per day.  Aim for no more than 5 hours between eating.  If you eat breakfast, please do so within one hour of getting up.   Each meal should contain half fruits/vegetables, one quarter protein, and one quarter carbs (no bigger than a computer mouse)  Cut down on sweet beverages. This includes juice, soda, and sweet tea.   Drink at least 1 glass of water with each meal and aim for at least 8 glasses per day  Exercise at least 150 minutes every week.

## 2023-07-25 ENCOUNTER — Ambulatory Visit: Payer: Medicare Other

## 2023-07-30 ENCOUNTER — Ambulatory Visit: Payer: Medicare Other | Admitting: Neurology

## 2023-08-02 ENCOUNTER — Other Ambulatory Visit: Payer: Self-pay | Admitting: Family Medicine

## 2023-09-02 ENCOUNTER — Ambulatory Visit: Admitting: Dermatology

## 2023-09-02 ENCOUNTER — Encounter: Payer: Self-pay | Admitting: Dermatology

## 2023-09-02 DIAGNOSIS — W908XXA Exposure to other nonionizing radiation, initial encounter: Secondary | ICD-10-CM

## 2023-09-02 DIAGNOSIS — L821 Other seborrheic keratosis: Secondary | ICD-10-CM

## 2023-09-02 DIAGNOSIS — Z1283 Encounter for screening for malignant neoplasm of skin: Secondary | ICD-10-CM | POA: Diagnosis not present

## 2023-09-02 DIAGNOSIS — D229 Melanocytic nevi, unspecified: Secondary | ICD-10-CM

## 2023-09-02 DIAGNOSIS — D1801 Hemangioma of skin and subcutaneous tissue: Secondary | ICD-10-CM | POA: Diagnosis not present

## 2023-09-02 DIAGNOSIS — L719 Rosacea, unspecified: Secondary | ICD-10-CM | POA: Diagnosis not present

## 2023-09-02 DIAGNOSIS — L820 Inflamed seborrheic keratosis: Secondary | ICD-10-CM | POA: Diagnosis not present

## 2023-09-02 DIAGNOSIS — L578 Other skin changes due to chronic exposure to nonionizing radiation: Secondary | ICD-10-CM

## 2023-09-02 DIAGNOSIS — L729 Follicular cyst of the skin and subcutaneous tissue, unspecified: Secondary | ICD-10-CM

## 2023-09-02 DIAGNOSIS — L814 Other melanin hyperpigmentation: Secondary | ICD-10-CM | POA: Diagnosis not present

## 2023-09-02 DIAGNOSIS — L219 Seborrheic dermatitis, unspecified: Secondary | ICD-10-CM

## 2023-09-02 MED ORDER — KETOCONAZOLE 2 % EX SHAM
MEDICATED_SHAMPOO | CUTANEOUS | 5 refills | Status: AC
Start: 2023-09-02 — End: ?

## 2023-09-02 NOTE — Patient Instructions (Signed)

## 2023-09-02 NOTE — Progress Notes (Signed)
 Follow-Up Visit   Subjective  Joan Mclaughlin is a 83 y.o. female who presents for the following: Skin Cancer Screening and Full Body Skin Exam  Last visit on 04/02/23. Ak on the leg, neck, and jaw line were treated with LN2. She complains of spots on the legs. Sometimes get itchy and inflamed.   She was evaluated for Seb Derm and prescribed Clobetasol  0.05 solution to apply on the scalp daily. She was advised to use CeraVe zinc based shampoo and Nizoral  shampoo. A prescription of Triamcinolone  0.1 was prescribed to use along the hairline. It is well controlled today.   Rosacea was also treated at this visit. Plan was to continue Metrocream . C/o itchiness on the eyelids. She is using aquaphor which helps a little bit.   The patient presents for Total-Body Skin Exam (TBSE) for skin cancer screening and mole check. The patient has spots, moles and lesions to be evaluated, some may be new or changing and the patient may have concern these could be cancer.    The following portions of the chart were reviewed this encounter and updated as appropriate: medications, allergies, medical history  Review of Systems:  No other skin or systemic complaints except as noted in HPI or Assessment and Plan.  Objective  Well appearing patient in no apparent distress; mood and affect are within normal limits.  A full examination was performed including scalp, head, eyes, ears, nose, lips, neck, chest, axillae, abdomen, back, buttocks, bilateral upper extremities, bilateral lower extremities, hands, feet, fingers, toes, fingernails, and toenails. All findings within normal limits unless otherwise noted below.   Relevant physical exam findings are noted in the Assessment and Plan.  Left Thigh - Anterior, Right Thigh - Anterior (2) Stuck-on verrucous, tan-brown papules and plaques.  Assessment & Plan   SKIN CANCER SCREENING PERFORMED TODAY.  ACTINIC DAMAGE - Chronic condition, secondary to  cumulative UV/sun exposure - diffuse scaly erythematous macules with underlying dyspigmentation - Recommend daily broad spectrum sunscreen SPF 30+ to sun-exposed areas, reapply every 2 hours as needed.  - Staying in the shade or wearing long sleeves, sun glasses (UVA+UVB protection) and wide brim hats (4-inch brim around the entire circumference of the hat) are also recommended for sun protection.  - Call for new or changing lesions.  LENTIGINES, SEBORRHEIC KERATOSES, HEMANGIOMAS - Benign normal skin lesions - Benign-appearing - Call for any changes  MELANOCYTIC NEVI - Tan-brown and/or pink-flesh-colored symmetric macules and papules - Benign appearing on exam today - Observation - Call clinic for new or changing moles - Recommend daily use of broad spectrum spf 30+ sunscreen to sun-exposed areas.    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  It tends to be exacerbated by stress and cooler weather.  People who have neurologic disease may experience new onset or exacerbation of existing seborrheic dermatitis.  The condition is not curable but treatable and can be controlled.  Treatment Plan: -Continue to use Ketoconazole  shampoo as needed for flares - Continue using CeraVe Zinc shampoo along with DHS Zinc shampoo  ROSACEA Exam Mid face erythema with telangiectasias +/- scattered inflammatory papules  wellcontrolled   Rosacea is a chronic progressive skin condition usually affecting the face of adults, causing redness and/or acne bumps. It is treatable but not  curable. It sometimes affects the eyes (ocular rosacea) as well. It may respond to topical and/or systemic medication and can flare with stress, sun exposure, alcohol, exercise, topical steroids  (including hydrocortisone /cortisone 10) and some foods.  Daily application of broad spectrum spf 30+ sunscreen to face is recommended to reduce flares.  Patient denies grittiness of the eyes  Treatment Plan -Continue using Metrocream  along with gentle skin care and serums    INFLAMED SEBORRHEIC KERATOSIS (3) Left Thigh - Anterior, Right Thigh - Anterior (2) Destruction of lesion - Left Thigh - Anterior, Right Thigh - Anterior (2) Complexity: simple   Destruction method: cryotherapy   Informed consent: discussed and consent obtained   Timeout:  patient name, date of birth, surgical site, and procedure verified Lesion destroyed using liquid nitrogen: Yes   Region frozen until ice ball extended beyond lesion: Yes   Outcome: patient tolerated procedure well with no complications   Post-procedure details: wound care instructions given    Return in about 1 year (around 09/01/2024) for TBSE.  I, Gordan Beams, CMA, am acting as scribe for [employer], DO.   Documentation: I have reviewed the above documentation for accuracy and completeness, and I agree with the above.  Delon Lenis, DO

## 2023-09-16 ENCOUNTER — Ambulatory Visit (INDEPENDENT_AMBULATORY_CARE_PROVIDER_SITE_OTHER)

## 2023-09-16 VITALS — BP 120/68 | HR 83 | Temp 98.0°F | Ht 62.0 in | Wt 141.4 lb

## 2023-09-16 DIAGNOSIS — Z0000 Encounter for general adult medical examination without abnormal findings: Secondary | ICD-10-CM | POA: Diagnosis not present

## 2023-09-16 NOTE — Progress Notes (Signed)
 Subjective:   Joan Mclaughlin is a 83 y.o. who presents for a Medicare Wellness preventive visit.  As a reminder, Annual Wellness Visits don't include a physical exam, and some assessments may be limited, especially if this visit is performed virtually. We may recommend an in-person follow-up visit with your provider if needed.  Visit Complete: In person  VideoDeclined- This patient declined Interactive audio and Acupuncturist. Therefore the visit was completed with audio only.  Persons Participating in Visit: Patient.  AWV Questionnaire: No: Patient Medicare AWV questionnaire was not completed prior to this visit.  Cardiac Risk Factors include: advanced age (>35men, >37 women);dyslipidemia;hypertension     Objective:    Today's Vitals   09/16/23 1412  BP: 120/68  Pulse: 83  Temp: 98 F (36.7 C)  SpO2: 95%  Weight: 141 lb 6.4 oz (64.1 kg)  Height: 5' 2 (1.575 m)   Body mass index is 25.86 kg/m.     09/16/2023    2:40 PM 07/27/2022   10:04 AM 07/19/2022   10:16 AM 07/06/2021    9:55 AM 11/22/2020    2:35 PM 08/31/2020    7:00 AM 07/01/2020    3:29 PM  Advanced Directives  Does Patient Have a Medical Advance Directive? Yes Yes Yes Yes Yes No Yes  Type of Estate agent of [place];Living will Healthcare Power of [employer] of [place];Living will Healthcare Power of [employer] Power of [employer] Power of Attorney  Does patient want to make changes to medical advance directive? No - Patient declined  No - Patient declined      Copy of Healthcare Power of Attorney in Chart? Yes - validated most recent copy scanned in chart (See row information)  Yes - validated most recent copy scanned in chart (See row information) Yes - validated most recent copy scanned in chart (See row information)   Yes - validated most recent copy scanned in chart (See row information)  Would patient like information on creating a  medical advance directive?      No - Patient declined     Current Medications (verified) Outpatient Encounter Medications as of 09/16/2023  Medication Sig   amLODipine  (NORVASC ) 5 MG tablet Take 1 tablet (5 mg total) by mouth daily.   Ascorbic Acid (VITAMIN C) 1000 MG tablet Take 1,000 mg by mouth daily.   b complex vitamins capsule Take 1 capsule by mouth daily.   CALCIUM-MAGNESIUM  PO Take 1 tablet by mouth daily. 1000 mg / 500 mg   Cholecalciferol (VITAMIN D3) 50 MCG (2000 UT) capsule Take 2,000 Units by mouth daily.   clobetasol  (TEMOVATE ) 0.05 % external solution Apply 1 Application topically 2 (two) times daily.   doxepin  (SINEQUAN ) 10 MG capsule Take 2 capsules (20 mg total) by mouth at bedtime.   hydrocortisone  2.5 % cream Apply topically 2 (two) times daily as needed (Rash).   hydrOXYzine  (ATARAX ) 10 MG tablet TAKE 1 TO 2 TABLETS (10-20 MG TOTAL) BY MOUTH AT BEDTIME   ketoconazole  (NIZORAL ) 2 % shampoo Use 2-3 times weekly as needed   Lysine 500 MG TABS Take 500 mg by mouth daily.   Multiple Vitamin (MULTIVITAMIN WITH MINERALS) TABS tablet Take 1 tablet by mouth daily.   Omega-3 Fatty Acids (FISH OIL PO) Take 630 mg by mouth daily.   ondansetron  (ZOFRAN ) 4 MG tablet Take 1 tablet (4 mg total) by mouth every 8 (eight) hours as needed for nausea or vomiting.   Oxcarbazepine  (TRILEPTAL )  300 MG tablet TAKE 1 TABLET BY MOUTH TWICE A DAY   Potassium 99 MG TABS Take 99 mg by mouth daily.   Probiotic Product (PROBIOTIC DAILY) CAPS Take 1 capsule by mouth daily. Woman   triamcinolone  cream (KENALOG ) 0.1 % Apply 1 Application topically daily.   TURMERIC PO Take 600 mg by mouth daily.   valACYclovir  (VALTREX ) 500 MG tablet TAKE 1 TABLET BY MOUTH DAILY AS NEEDED (COLD SORES). (Patient taking differently: as needed. TAKE 1 TABLET BY MOUTH DAILY AS NEEDED (COLD SORES).)   [DISCONTINUED] MELATONIN PO Take 1 capsule by mouth at bedtime.   [DISCONTINUED] nitrofurantoin , macrocrystal-monohydrate,  (MACROBID ) 100 MG capsule Take 1 capsule (100 mg total) by mouth 2 (two) times daily.   [DISCONTINUED] omeprazole (PRILOSEC) 40 MG capsule Take 40 mg by mouth daily.   No facility-administered encounter medications on file as of 09/16/2023.    Allergies (verified) Amoxicillin -pot clavulanate, Doxycycline , and Suvorexant   History: Past Medical History:  Diagnosis Date   Alcoholism (HCC)    sober for 47 years-noted 09/16/2018   Allergy    Anxiety    Cancer (HCC)    skin cancer   Cholecystitis    Complication of anesthesia    Difficulty sleeping    takes med to sleep   Diverticulitis    Family history of adverse reaction to anesthesia    SIster - N/V   Gallstones    Ganglion cyst 12/11/2016   GERD (gastroesophageal reflux disease)    RELATED TO GALLBLADDER   Heart murmur    ONLY DETECTED WHEN LYING DOWN  - 10/16/2018- Not concerned with   History of Lyme disease 09/19/2016   Hyperlipidemia    Hypertension    Lyme disease    Pt states she has had Lyme Disease twice.    Nocturia    Pneumonia    PONV (postoperative nausea and vomiting)    Nausea   S/P laparoscopic cholecystectomy 12/23/2014   Seizure disorder (HCC)    Seizures (HCC)    Seizures (HCC)    last one 10- 15 years ago - 10/16/2018   Squamous cell carcinoma of skin 12/25/2018   RIGHT FOREARM    Squamous cell carcinoma of skin 09/30/2019   in situ-left lower leg-anterior   Trimalleolar fracture    left   Tubular adenoma of colon 06/27/2018   Past Surgical History:  Procedure Laterality Date   CHOLECYSTECTOMY N/A 12/23/2014   Procedure: LAPAROSCOPIC CHOLECYSTECTOMY WITH INTRAOPERATIVE CHOLANGIOGRAM;  Surgeon: Camellia Blush, MD;  Location: [facility] ORS;  Service: General;  Laterality: N/A;   COLONOSCOPY     3 times trying to get large polyp since August 2020   COLONOSCOPY WITH PROPOFOL  N/A 10/20/2018   Procedure: COLONOSCOPY WITH PROPOFOL ;  Surgeon: Wilhelmenia Aloha Raddle., MD;  Location: [facility] ENDOSCOPY;  Service:  Gastroenterology;  Laterality: N/A;   COLONOSCOPY WITH PROPOFOL  N/A 11/02/2019   Procedure: COLONOSCOPY WITH PROPOFOL ;  Surgeon: Mansouraty, Aloha Raddle., MD;  Location: [facility] ENDOSCOPY;  Service: Gastroenterology;  Laterality: N/A;   COLONOSCOPY WITH PROPOFOL  N/A 08/31/2020   Procedure: COLONOSCOPY WITH PROPOFOL ;  Surgeon: Mansouraty, Aloha Raddle., MD;  Location: [facility] ENDOSCOPY;  Service: Gastroenterology;  Laterality: N/A;   ENDOSCOPIC MUCOSAL RESECTION N/A 10/20/2018   Procedure: ENDOSCOPIC MUCOSAL RESECTION;  Surgeon: Wilhelmenia Aloha Raddle., MD;  Location: [facility] ENDOSCOPY;  Service: Gastroenterology;  Laterality: N/A;   ENDOSCOPIC MUCOSAL RESECTION  11/02/2019   Procedure: ENDOSCOPIC MUCOSAL RESECTION;  Surgeon: Wilhelmenia Aloha Raddle., MD;  Location: [facility] ENDOSCOPY;  Service: Gastroenterology;;  HEMOSTASIS CLIP PLACEMENT  10/20/2018   Procedure: HEMOSTASIS CLIP PLACEMENT;  Surgeon: Wilhelmenia Aloha Raddle., MD;  Location: [facility] ENDOSCOPY;  Service: Gastroenterology;;   HEMOSTASIS CLIP PLACEMENT  11/02/2019   Procedure: HEMOSTASIS CLIP PLACEMENT;  Surgeon: Wilhelmenia Aloha Raddle., MD;  Location: [facility] ENDOSCOPY;  Service: Gastroenterology;;   ORIF ANKLE FRACTURE Left 02/19/2019   Procedure: OPEN REDUCTION INTERNAL FIXATION (ORIF) LEFT ANKLE FRACTURE;  Surgeon: Elsa Lonni SAUNDERS, MD;  Location: [facility] OR;  Service: Orthopedics;  Laterality: Left;   POLYPECTOMY  10/20/2018   Procedure: POLYPECTOMY;  Surgeon: Mansouraty, Aloha Raddle., MD;  Location: [facility] ENDOSCOPY;  Service: Gastroenterology;;   POLYPECTOMY  11/02/2019   Procedure: POLYPECTOMY;  Surgeon: Wilhelmenia Aloha Raddle., MD;  Location: [facility] ENDOSCOPY;  Service: Gastroenterology;;   POLYPECTOMY  08/31/2020   Procedure: POLYPECTOMY;  Surgeon: Wilhelmenia Aloha Raddle., MD;  Location: [facility] ENDOSCOPY;  Service: Gastroenterology;;   SUBMUCOSAL LIFTING INJECTION  10/20/2018   Procedure: SUBMUCOSAL LIFTING INJECTION;  Surgeon: Wilhelmenia Aloha Raddle., MD;  Location: [facility]  ENDOSCOPY;  Service: Gastroenterology;;   SYNDESMOSIS REPAIR Left 02/19/2019   Procedure: SYNDESMOSIS REPAIR;  Surgeon: Elsa Lonni SAUNDERS, MD;  Location: [facility] OR;  Service: Orthopedics;  Laterality: Left;   THORACIC DISCECTOMY  07/2007   T12   TUBAL LIGATION     WISDOM TOOTH EXTRACTION     Family History  Problem Relation Age of Onset   Arthritis Father    Alcohol abuse Mother        drinker and smoker   Diabetes Mother    Heart disease Mother    Hypertension Mother    Stroke Mother    Depression Sister    Hearing loss Sister    Hyperlipidemia Sister    Alcohol abuse Daughter    Alcohol abuse Son    Depression Sister    Early death Sister    Alcohol abuse Brother    Depression Sister    Heart disease Sister    Colon cancer Neg Hx    Esophageal cancer Neg Hx    Inflammatory bowel disease Neg Hx    Liver disease Neg Hx    Pancreatic cancer Neg Hx    Rectal cancer Neg Hx    Stomach cancer Neg Hx    Social History   Socioeconomic History   Marital status: Single    Spouse name: Not on file   Number of children: 4   Years of education: 14   Highest education level: Not on file  Occupational History   Occupation: retired    Comment: Midwife payable and nanny   Tobacco Use   Smoking status: Never   Smokeless tobacco: Never  Vaping Use   Vaping status: Never Used  Substance and Sexual Activity   Alcohol use: No    Alcohol/week: 0.0 standard drinks of alcohol    Comment: sober for 47 years-noted 09/16/2018   Drug use: No   Sexual activity: Not Currently  Other Topics Concern   Not on file  Social History Narrative   Patient lives alone.   patient has 4 grandchildren.   Patient is retired   Patient has 2 years of college.   Patient is right handed.         Social Drivers of Corporate investment banker Strain: Low Risk  (09/16/2023)   Overall Financial Resource Strain (CARDIA)    Difficulty of Paying Living Expenses: Not hard at all  Food Insecurity: No Food  Insecurity (09/16/2023)   Hunger Vital Sign  Worried About Programme researcher, broadcasting/film/video in the Last Year: Never true    Ran Out of Food in the Last Year: Never true  Transportation Needs: No Transportation Needs (09/16/2023)   PRAPARE - Administrator, Civil Service (Medical): No    Lack of Transportation (Non-Medical): No  Physical Activity: Insufficiently Active (09/16/2023)   Exercise Vital Sign    Days of Exercise per Week: 4 days    Minutes of Exercise per Session: 30 min  Stress: No Stress Concern Present (09/16/2023)   [employer] of Occupational Health - Occupational Stress Questionnaire    Feeling of Stress: Not at all  Social Connections: Moderately Isolated (09/16/2023)   Social Connection and Isolation Panel    Frequency of Communication with Friends and Family: More than three times a week    Frequency of Social Gatherings with Friends and Family: More than three times a week    Attends Religious Services: 1 to 4 times per year    Active Member of [employer] or Organizations: No    Attends Engineer, structural: Never    Marital Status: Never married    Tobacco Counseling Counseling given: Not Answered    Clinical Intake:  Pre-visit preparation completed: Yes  Pain : No/denies pain     BMI - recorded: 25.86 Nutritional Status: BMI 25 -29 Overweight Nutritional Risks: None Diabetes: No  No results found for: HGBA1C   How often do you need to have someone help you when you read instructions, pamphlets, or other written materials from your doctor or pharmacy?: 1 - Never  Interpreter Needed?: No  Information entered by :: Ellouise Haws, LPN   Activities of Daily Living     09/16/2023    2:22 PM  In your present state of health, do you have any difficulty performing the following activities:  Hearing? 0  Vision? 0  Difficulty concentrating or making decisions? 0  Walking or climbing stairs? 0  Dressing or bathing? 0  Doing errands, shopping? 0   Preparing Food and eating ? N  Using the Toilet? N  In the past six months, have you accidently leaked urine? N  Do you have problems with loss of bowel control? N  Managing your Medications? N  Managing your Finances? N  Housekeeping or managing your Housekeeping? N    Patient Care Team: Kennyth Worth HERO, MD as PCP - General (Family Medicine) Caresse Cough, MD as Referring Physician (Ophthalmology) Celestia Agent, MD (Inactive) as Consulting Physician (Gastroenterology) Porter Andrez SAUNDERS, PA-C (Inactive) as Physician Assistant (Dermatology) Georjean Darice HERO, MD as Consulting Physician (Neurology) Janit Thresa HERO, DPM as Consulting Physician (Podiatry) Clark-Burning, Delon, PA-C (Inactive) (Dermatology)  I have updated your Care Teams any recent Medical Services you may have received from other providers in the past year.     Assessment:   This is a routine wellness examination for Meka.  Hearing/Vision screen Hearing Screening - Comments:: Pt declined hearing issues  Vision Screening - Comments:: Pt will follow up with Dr KANDICE aand Dr waylan to check insurance and may need a referral    Goals Addressed             This Visit's Progress    Patient Stated       More disciplined about exercise        Depression Screen     09/16/2023    2:29 PM 07/22/2023    1:00 PM 05/03/2023    2:25 PM 02/01/2023  12:56 PM 12/14/2022    2:11 PM 11/26/2022   11:38 AM 09/17/2022    2:39 PM  PHQ 2/9 Scores  PHQ - 2 Score 0 0 0 0 0 0 0  PHQ- 9 Score       0    Fall Risk     09/16/2023    2:40 PM 07/22/2023    1:00 PM 05/03/2023    2:25 PM 02/01/2023   12:56 PM 12/14/2022    2:11 PM  Fall Risk   Falls in the past year? 0 0 0 0 0  Number falls in past yr: 0 0 0 0 0  Injury with Fall? 0 0 0 0 0  Risk for fall due to : Impaired balance/gait No Fall Risks No Fall Risks No Fall Risks No Fall Risks  Follow up Falls prevention discussed        MEDICARE RISK AT HOME:  Medicare Risk  at Home Any stairs in or around the home?: No If so, are there any without handrails?: No Home free of loose throw rugs in walkways, pet beds, electrical cords, etc?: Yes Adequate lighting in your home to reduce risk of falls?: Yes Life alert?: No Use of a cane, walker or w/c?: No Grab bars in the bathroom?: Yes Shower chair or bench in shower?: Yes Elevated toilet seat or a handicapped toilet?: No  TIMED UP AND GO:  Was the test performed?  No  Cognitive Function: 6CIT completed        09/16/2023    2:41 PM 07/19/2022   10:18 AM 07/06/2021    9:58 AM 07/01/2020    3:33 PM  6CIT Screen  What Year? 0 points 0 points 0 points 0 points  What month? 0 points 0 points 0 points 0 points  What time? 0 points 0 points 0 points 0 points  Count back from 20 0 points 0 points 0 points 0 points  Months in reverse 0 points 0 points 0 points 0 points  Repeat phrase 0 points 0 points 0 points 0 points  Total Score 0 points 0 points 0 points 0 points    Immunizations Immunization History  Administered Date(s) Administered   Influenza-Unspecified 10/22/2017   Moderna Sars-Covid-2 Vaccination 12/16/2019, 01/13/2020   Pneumococcal Conjugate-13 05/16/2016   Pneumococcal Polysaccharide-23 06/25/2013   Tdap 10/22/2017   Zoster Recombinant(Shingrix) 03/24/2017, 07/23/2017    Screening Tests Health Maintenance  Topic Date Due   Medicare Annual Wellness (AWV)  09/15/2024   DTaP/Tdap/Td (2 - Td or Tdap) 10/23/2027   Pneumococcal Vaccine: 50+ Years  Completed   Zoster Vaccines- Shingrix  Completed   HPV VACCINES  Aged Out   Meningococcal B Vaccine  Aged Out   Influenza Vaccine  Discontinued   DEXA SCAN  Discontinued   COVID-19 Vaccine  Discontinued    Health Maintenance Items Addressed: See Nurse Notes at the end of this note  Additional Screening:  Vision Screening: Recommended annual ophthalmology exams for early detection of glaucoma and other disorders of the eye. Is the patient  up to date with their annual eye exam?  No  Who is the provider or what is the name of the office in which the patient attends annual eye exams? Dr Waylan, Dr KANDICE or [facility] pt also stated may need a referral due to insurance   Dental Screening: Recommended annual dental exams for proper oral hygiene  Community Resource Referral / Chronic Care Management: CRR required this visit?  No  CCM required this visit?  No   Plan:    I have personally reviewed and noted the following in the patient's chart:   Medical and social history Use of alcohol, tobacco or illicit drugs  Current medications and supplements including opioid prescriptions. Patient is not currently taking opioid prescriptions. Functional ability and status Nutritional status Physical activity Advanced directives List of other physicians Hospitalizations, surgeries, and ER visits in previous 12 months Vitals Screenings to include cognitive, depression, and falls Referrals and appointments  In addition, I have reviewed and discussed with patient certain preventive protocols, quality metrics, and best practice recommendations. A written personalized care plan for preventive services as well as general preventive health recommendations were provided to patient.   Ellouise VEAR Haws, LPN   [id number]/01/7972   After Visit Summary: (In Person-Printed) AVS printed and given to the patient  Notes: Nothing significant to report at this time.

## 2023-09-16 NOTE — Patient Instructions (Signed)
 Joan Mclaughlin,  Thank you for taking the time for your Medicare Wellness Visit. I appreciate your continued commitment to your health goals. Please review the care plan we discussed, and feel free to reach out if I can assist you further.  Medicare recommends these wellness visits once per year to help you and your care team stay ahead of potential health issues. These visits are designed to focus on prevention, allowing your provider to concentrate on managing your acute and chronic conditions during your regular appointments.  Please note that Annual Wellness Visits do not include a physical exam. Some assessments may be limited, especially if the visit was conducted virtually. If needed, we may recommend a separate in-person follow-up with your provider.  Ongoing Care Seeing your primary care provider every 3 to 6 months helps us  monitor your health and provide consistent, personalized care.   Referrals If a referral was made during today's visit and you haven't received any updates within two weeks, please contact the referred provider directly to check on the status.  Recommended Screenings:  Health Maintenance  Topic Date Due   Medicare Annual Wellness Visit  07/19/2023   DTaP/Tdap/Td vaccine (2 - Td or Tdap) 10/23/2027   Pneumococcal Vaccine for age over 12  Completed   Zoster (Shingles) Vaccine  Completed   HPV Vaccine  Aged Out   Meningitis B Vaccine  Aged Out   Flu Shot  Discontinued   DEXA scan (bone density measurement)  Discontinued   COVID-19 Vaccine  Discontinued       07/27/2022   10:04 AM  Advanced Directives  Does Patient Have a Medical Advance Directive? Yes  Type of Advance Directive Healthcare Power of [facility]   Advance Care Planning is important because it: Ensures you receive medical care that aligns with your values, goals, and preferences. Provides guidance to your family and loved ones, reducing the emotional burden of decision-making during critical  moments.  Vision: Annual vision screenings are recommended for early detection of glaucoma, cataracts, and diabetic retinopathy. These exams can also reveal signs of chronic conditions such as diabetes and high blood pressure.  Dental: Annual dental screenings help detect early signs of oral cancer, gum disease, and other conditions linked to overall health, including heart disease and diabetes.  Please see the attached documents for additional preventive care recommendations.

## 2023-10-28 ENCOUNTER — Other Ambulatory Visit: Payer: Self-pay | Admitting: Neurology

## 2023-10-28 ENCOUNTER — Other Ambulatory Visit: Payer: Self-pay | Admitting: Family Medicine

## 2023-10-28 DIAGNOSIS — G40009 Localization-related (focal) (partial) idiopathic epilepsy and epileptic syndromes with seizures of localized onset, not intractable, without status epilepticus: Secondary | ICD-10-CM

## 2023-11-20 ENCOUNTER — Encounter: Payer: Self-pay | Admitting: Neurology

## 2023-12-12 ENCOUNTER — Other Ambulatory Visit: Payer: Self-pay | Admitting: Family Medicine

## 2023-12-12 ENCOUNTER — Encounter: Payer: Self-pay | Admitting: Family Medicine

## 2023-12-12 ENCOUNTER — Ambulatory Visit: Admitting: Family Medicine

## 2023-12-12 VITALS — BP 112/70 | HR 69 | Temp 97.2°F | Ht 62.0 in | Wt 144.0 lb

## 2023-12-12 DIAGNOSIS — H6642 Suppurative otitis media, unspecified, left ear: Secondary | ICD-10-CM | POA: Diagnosis not present

## 2023-12-12 DIAGNOSIS — R2689 Other abnormalities of gait and mobility: Secondary | ICD-10-CM | POA: Diagnosis not present

## 2023-12-12 DIAGNOSIS — I159 Secondary hypertension, unspecified: Secondary | ICD-10-CM | POA: Diagnosis not present

## 2023-12-12 DIAGNOSIS — J309 Allergic rhinitis, unspecified: Secondary | ICD-10-CM

## 2023-12-12 MED ORDER — CEFDINIR 300 MG PO CAPS: 600.0000 mg | ORAL_CAPSULE | Freq: Every day | ORAL | 0 refills | Status: AC

## 2023-12-12 NOTE — Assessment & Plan Note (Signed)
 Patient with unsteady gait though no vertigo symptoms.  She is interested in seeing a physical therapist to help with gait training and balance exercises.  Will place referral today.

## 2023-12-12 NOTE — Progress Notes (Signed)
   Joan Mclaughlin is a 83 y.o. female who presents today for an office visit.  Assessment/Plan:  New/Acute Problems: Otitis Media  Exam consistent with otitis media.  She has not tolerated Augmentin  well in the past.  Will start cefdinir.  She can use over-the-counter meds as needed as well.  We discussed reasons to return to care.  Follow-up as needed  Chronic Problems Addressed Today: Imbalance Patient with unsteady gait though no vertigo symptoms.  She is interested in seeing a physical therapist to help with gait training and balance exercises.  Will place referral today.  Hypertension At goal today on amlodipine  5 mg daily.     Subjective:  HPI:  See assessment / plan for status of chronic conditions.    Discussed the use of AI scribe software for clinical note transcription with the patient, who gave verbal consent to proceed.  History of Present Illness Joan Mclaughlin is an 83 year old female who presents with left ear pain and balance problems.  She has been experiencing intermittent hearing issues that started last week. She has been using an older prescription for this issue but is unsure of its effectiveness as she sometimes needs others to repeat themselves.  She feels 'a little wobbly' and 'off,' with her balance being affected. No room spinning sensations or feeling like she might pass out, but she does feel 'off' and not herself. She has not experienced any falls or stumbles and is still able to climb ladders.  She mentions experiencing chills and intermittent ear pain, specifically in the left ear. Her past medical history includes a bad reaction to Augmentin , which caused nausea, and doxycycline , which caused hives.         Objective:  Physical Exam: BP 112/70   Pulse 69   Temp (!) 97.2 F (36.2 C) (Temporal)   Ht 5' 2 (1.575 m)   Wt 144 lb (65.3 kg)   SpO2 95%   BMI 26.34 kg/m   Gen: No acute distress, resting comfortably HEENT: Left  TM with erythema and mild effusion.  Right TM clear. CV: Regular rate and rhythm with no murmurs appreciated Pulm: Normal work of breathing, clear to auscultation bilaterally with no crackles, wheezes, or rhonchi Neuro: Grossly normal, moves all extremities Psych: Normal affect and thought content      Joan Mclaughlin M. Kennyth, MD 12/12/2023 1:48 PM

## 2023-12-12 NOTE — Patient Instructions (Signed)
 It was very nice to see you today!  VISIT SUMMARY: Today we discussed your intermittent hearing issues, balance problems, and ear pain. You have been diagnosed with an ear infection and balance disturbance.  YOUR PLAN: ACUTE LEFT OTITIS MEDIA: You have an ear infection in your left ear, which is causing pain and affecting your hearing. -You have been prescribed Omnicef (cefdinir) to take once daily due to your previous reactions to other antibiotics.  GAIT AND BALANCE DISTURBANCE: You are experiencing balance problems without dizziness or fainting, which may be related to your inner ear. -You have been referred to vestibular rehabilitation to help with your balance issues.  Return if symptoms worsen or fail to improve.   Take care, Dr Kennyth  PLEASE NOTE:  If you had any lab tests, please let us  know if you have not heard back within a few days. You may see your results on mychart before we have a chance to review them but we will give you a call once they are reviewed by us .   If we ordered any referrals today, please let us  know if you have not heard from their office within the next week.   If you had any urgent prescriptions sent in today, please check with the pharmacy within an hour of our visit to make sure the prescription was transmitted appropriately.   Please try these tips to maintain a healthy lifestyle:  Eat at least 3 REAL meals and 1-2 snacks per day.  Aim for no more than 5 hours between eating.  If you eat breakfast, please do so within one hour of getting up.   Each meal should contain half fruits/vegetables, one quarter protein, and one quarter carbs (no bigger than a computer mouse)  Cut down on sweet beverages. This includes juice, soda, and sweet tea.   Drink at least 1 glass of water with each meal and aim for at least 8 glasses per day  Exercise at least 150 minutes every week.

## 2023-12-12 NOTE — Assessment & Plan Note (Signed)
 At goal today on amlodipine  5 mg daily.

## 2023-12-31 ENCOUNTER — Ambulatory Visit

## 2023-12-31 ENCOUNTER — Other Ambulatory Visit: Payer: Self-pay

## 2023-12-31 DIAGNOSIS — R2681 Unsteadiness on feet: Secondary | ICD-10-CM | POA: Insufficient documentation

## 2023-12-31 DIAGNOSIS — R2689 Other abnormalities of gait and mobility: Secondary | ICD-10-CM | POA: Insufficient documentation

## 2023-12-31 DIAGNOSIS — R262 Difficulty in walking, not elsewhere classified: Secondary | ICD-10-CM | POA: Insufficient documentation

## 2023-12-31 NOTE — Therapy (Signed)
 " OUTPATIENT PHYSICAL THERAPY NEURO EVALUATION   Patient Name: Joan Mclaughlin MRN: [id number] DOB:[birth year], 83 y.o., female Today's Date: 12/31/2023   PCP: Kennyth Worth HERO, MD REFERRING PROVIDER: Kennyth Worth HERO, MD  END OF SESSION:  PT End of Session - 12/31/23 1308     Visit Number 1    Number of Visits 3    Date for Recertification  01/28/24    Authorization Type [employer]    PT Start Time 1315    PT Stop Time 1400    PT Time Calculation (min) 45 min          Past Medical History:  Diagnosis Date   Alcoholism (HCC)    sober for 47 years-noted 09/16/2018   Allergy    Anxiety    Cancer (HCC)    skin cancer   Cholecystitis    Complication of anesthesia    Difficulty sleeping    takes med to sleep   Diverticulitis    Family history of adverse reaction to anesthesia    SIster - N/V   Gallstones    Ganglion cyst 12/11/2016   GERD (gastroesophageal reflux disease)    RELATED TO GALLBLADDER   Heart murmur    ONLY DETECTED WHEN LYING DOWN  - 10/16/2018- Not concerned with   History of Lyme disease 09/19/2016   Hyperlipidemia    Hypertension    Lyme disease    Pt states she has had Lyme Disease twice.    Nocturia    Pneumonia    PONV (postoperative nausea and vomiting)    Nausea   S/P laparoscopic cholecystectomy 12/23/2014   Seizure disorder (HCC)    Seizures (HCC)    Seizures (HCC)    last one 10- 15 years ago - 10/16/2018   Squamous cell carcinoma of skin 12/25/2018   RIGHT FOREARM    Squamous cell carcinoma of skin 09/30/2019   in situ-left lower leg-anterior   Trimalleolar fracture    left   Tubular adenoma of colon 06/27/2018   Past Surgical History:  Procedure Laterality Date   CHOLECYSTECTOMY N/A 12/23/2014   Procedure: LAPAROSCOPIC CHOLECYSTECTOMY WITH INTRAOPERATIVE CHOLANGIOGRAM;  Surgeon: Camellia Blush, MD;  Location: [facility] ORS;  Service: General;  Laterality: N/A;   COLONOSCOPY     3 times trying to get large polyp  since August 2020   COLONOSCOPY WITH PROPOFOL  N/A 10/20/2018   Procedure: COLONOSCOPY WITH PROPOFOL ;  Surgeon: Wilhelmenia Aloha Raddle., MD;  Location: [facility] ENDOSCOPY;  Service: Gastroenterology;  Laterality: N/A;   COLONOSCOPY WITH PROPOFOL  N/A 11/02/2019   Procedure: COLONOSCOPY WITH PROPOFOL ;  Surgeon: Wilhelmenia Aloha Raddle., MD;  Location: [facility] ENDOSCOPY;  Service: Gastroenterology;  Laterality: N/A;   COLONOSCOPY WITH PROPOFOL  N/A 08/31/2020   Procedure: COLONOSCOPY WITH PROPOFOL ;  Surgeon: Mansouraty, Aloha Raddle., MD;  Location: [facility] ENDOSCOPY;  Service: Gastroenterology;  Laterality: N/A;   ENDOSCOPIC MUCOSAL RESECTION N/A 10/20/2018   Procedure: ENDOSCOPIC MUCOSAL RESECTION;  Surgeon: Wilhelmenia Aloha Raddle., MD;  Location: [facility] ENDOSCOPY;  Service: Gastroenterology;  Laterality: N/A;   ENDOSCOPIC MUCOSAL RESECTION  11/02/2019   Procedure: ENDOSCOPIC MUCOSAL RESECTION;  Surgeon: Wilhelmenia Aloha Raddle., MD;  Location: [facility] ENDOSCOPY;  Service: Gastroenterology;;   HEMOSTASIS CLIP PLACEMENT  10/20/2018   Procedure: HEMOSTASIS CLIP PLACEMENT;  Surgeon: Wilhelmenia Aloha Raddle., MD;  Location: [facility] ENDOSCOPY;  Service: Gastroenterology;;   HEMOSTASIS CLIP PLACEMENT  11/02/2019   Procedure: HEMOSTASIS CLIP PLACEMENT;  Surgeon: Wilhelmenia Aloha Raddle., MD;  Location: [facility] ENDOSCOPY;  Service: Gastroenterology;;   ORIF ANKLE  FRACTURE Left 02/19/2019   Procedure: OPEN REDUCTION INTERNAL FIXATION (ORIF) LEFT ANKLE FRACTURE;  Surgeon: Elsa Lonni SAUNDERS, MD;  Location: [facility] OR;  Service: Orthopedics;  Laterality: Left;   POLYPECTOMY  10/20/2018   Procedure: POLYPECTOMY;  Surgeon: Mansouraty, Aloha Raddle., MD;  Location: [facility] ENDOSCOPY;  Service: Gastroenterology;;   POLYPECTOMY  11/02/2019   Procedure: POLYPECTOMY;  Surgeon: Wilhelmenia Aloha Raddle., MD;  Location: [facility] ENDOSCOPY;  Service: Gastroenterology;;   POLYPECTOMY  08/31/2020   Procedure: POLYPECTOMY;  Surgeon: Wilhelmenia Aloha Raddle., MD;  Location: [facility]  ENDOSCOPY;  Service: Gastroenterology;;   SUBMUCOSAL LIFTING INJECTION  10/20/2018   Procedure: SUBMUCOSAL LIFTING INJECTION;  Surgeon: Wilhelmenia Aloha Raddle., MD;  Location: [facility] ENDOSCOPY;  Service: Gastroenterology;;   SYNDESMOSIS REPAIR Left 02/19/2019   Procedure: SYNDESMOSIS REPAIR;  Surgeon: Elsa Lonni SAUNDERS, MD;  Location: [facility] OR;  Service: Orthopedics;  Laterality: Left;   THORACIC DISCECTOMY  07/2007   T12   TUBAL LIGATION     WISDOM TOOTH EXTRACTION     Patient Active Problem List   Diagnosis Date Noted   Imbalance 12/12/2023   Colon cancer screening declined 03/22/2023   History of Clostridium difficile infection 03/22/2023   Hx of adenomatous colonic polyps 03/22/2023   Diarrhea 11/26/2022   Seborrheic dermatitis 01/16/2021   Stress 01/16/2021   Osteoarthritis 06/30/2020   Diverticulosis 05/10/2020   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   Hypertension 10/14/2018   Abnormal colonoscopy 09/18/2018   History of colonic polyps 09/16/2018   Tubular adenoma of colon 06/27/2018   Dyslipidemia 03/06/2018   Allergic rhinitis 03/05/2018   History of cold sores 03/05/2018   Dermatochalasis of both upper eyelids 01/12/2018   Constipation 01/14/2017   Localization-related idiopathic epilepsy and epileptic syndromes with seizures of localized onset, not intractable, without status epilepticus (HCC) 03/02/2015   Insomnia 05/02/2013    ONSET DATE: 12/2023  REFERRING DIAG:  R26.89 (ICD-10-CM) - Imbalance    THERAPY DIAG:  Unsteadiness on feet  Rationale for Evaluation and Treatment: Rehabilitation  SUBJECTIVE:                                                                                                                                                                                             SUBJECTIVE STATEMENT: Pt reports recent ear infection and completed course of antibiotics. Notes some lingering imbalance but not experiencing vertigo.  Reports hx of seizure  disorder but no recent episodes. Denies any photo/phonophobia, HA, otalgia, dizziness Pt accompanied by: self  PERTINENT HISTORY: recent hx of ear infection  PAIN:  Are you having pain? No  PRECAUTIONS: None  RED FLAGS: None   WEIGHT  BEARING RESTRICTIONS: No  FALLS: Has patient fallen in last 6 months? No  LIVING ENVIRONMENT: Lives with: lives alone Lives in: House/apartment Stairs: 1-2 STE, ground floor set-up Has following equipment at home: None  PLOF: Independent  PATIENT GOALS:   OBJECTIVE:  Note: Objective measures were completed at Evaluation unless otherwise noted.  DIAGNOSTIC FINDINGS: n/a for episode  COGNITION: Overall cognitive status: Within functional limits for tasks assessed   SENSATION: WFL  COORDINATION: WNL  EDEMA:  none  MUSCLE TONE: WNL    DTRs:  NT  POSTURE: No Significant postural limitations  LOWER EXTREMITY ROM:     WNL LOWER EXTREMITY MMT:    5/5 to seated resisted tests  BED MOBILITY:  Not tested  TRANSFERS: Independent  CURB:  Findings: independent  STAIRS: Findings: Comments: reciprocal, no HR-independent GAIT: Findings: Comments: WNL  FUNCTIONAL TESTS:   10 meter walk test: 9 sec = 3.6 ft/sec 5 times sit to stand: 13 sec +Romberg Single leg stance: 3-5 sec M-CTSIB  Condition 1: Firm Surface, EO 30 Sec, Normal and Mild Sway  Condition 2: Firm Surface, EC 30 Sec, Mild Sway  Condition 3: Foam Surface, EO 30 Sec, Mild Sway  Condition 4: Foam Surface, EC 17 Sec, Moderate and Severe Sway    Berg Balance Scale:  Item Test date: 12/31/23 Date:  Date:   Sitting to standing 4. able to stand without using hands and stabilize independently Insert SmartPhrase OPRCBERGREEVAL Insert SmartPhrase OPRCBERGREEVAL  2. Standing unsupported 4. able to stand safely for 2 minutes    3. Sitting with back unsupported, feet supported 4. able to sit safely and securely for 2 minutes    4. Standing to sitting 4. sits safely  with minimal use of hands    5. Pivot transfer  4. able to transfer safely with minor use of hands    6. Standing unsupported with eyes closed 4. able to stand 10 seconds safely    7. Standing unsupported with feet together 4. able to place feet together independently and stand 1 minute safely    8. Reaching forward with outstretched arms while standing 4. can reach forward confidently 25 cm (10 inches)    9. Pick up object from the floor from standing 4. able to pick up slipper safely and easily    10. Turning to look behind over left and right shoulders while standing 4. looks behind from both sides and weight shifts well    11. Turn 360 degrees 4. able to turn 360 degrees safely in 4 seconds or less    12. Place alternate foot on step or stool while standing unsupported 4. ble to stand independently and safely and complete 8 steps in 20 seconds    13. Standing unsupported one foot in front 3. able to place foot ahead independently and hold 30 seconds    14. Standing on one leg 2. able to lift leg independently and hold >= 3 seconds      Total Score 53/56 Total Score:    Total Score:     Dynamic Gait Index: Dynamic Gait Index  Mark the lowest level that applies.   Date Performed 12/31/23  Gait level surface (3) Normal: walks 20', no AD, good speed, no evidence for imbalance, normal gait pattern  2. Change in gait speed (3) Normal: Able to smoothly change walking speed without loss of balance or gait deviation. Shows a significant difference in walking speeds between normal, fast and slow speeds  3. Gait with  horizontal head turns (3) Normal: Performs head turns smoothly with no change in gait  4. Gait with vertical head turns (3) Normal: Performs head turns smoothly with no change in gait  5. Gait and pivot turn (3) Normal: Pivot turns safely within 3 seconds and stops quickly with no loss of balance  6. Step over obstacle (3) Normal: Is able to step over the box without changing gait speed,  no evidence of imbalance  7. Step around obstacle (3) Normal: Is able to walk around cones safely without changing gait speed; no evidence of imbalance  8. Steps (3) Normal: Alternating feet, no rail  Total score 24/24    Score Interpretation: Score of <19 indicates high risk of falls.  Minimally Clinically Important Difference (MCID):  =DGI scores of<21/24 = 1.80 points DGI scores of >21/24 = 0.60 points   [place] T, Inbar-Borovsky N, Brozgol M, Giladi N, [place] JM. The Dynamic Gait Index in healthy older adults: the role of stair climbing, fear of falling and gender. Gait Posture. 2009 Feb;29(2):237-41. doi: 10.1016/j.gaitpost.2008.08.013. Epub 2008 Oct 8. PMID: 81154560; PMCID: [id number].  Pardasaney, MYRTIS LOIS Bonus, GEANNIE POUR., et al. (2012). Sensitivity to change and responsiveness of four balance measures for community-dwelling older adults. Physical therapy 92(3): 388-397.   PATIENT SURVEYS:                                                                                                                                TREATMENT DATE: 12/31/23    PATIENT EDUCATION: Education details: assessment details, outcome measure scores/interpretation, HEP initiation Person educated: Patient Education method: Explanation, Demonstration, and Handouts Education comprehension: needs further education  HOME EXERCISE PROGRAM: Access Code: [email or web address] URL: [email or web address] Date: 12/31/2023 Prepared by: Kelly Daviana Haymaker  Exercises - Corner Balance Feet Together With Eyes Closed  - 3-5 x weekly - 3 sets - 30 sec hold - Corner Balance Feet Together: Eyes Closed With Head Turns  - 3-5 x weekly - 3 sets - 30 sec hold - Tandem Stance in Corner  - 3-5 x weekly - 3 sets - 15-30 sec hold  GOALS: Goals reviewed with patient? Yes  SHORT TERM GOALS: Target date: same as LTG   LONG TERM GOALS: Target date: 01/28/2024    Patient will be independent in HEP to improve functional  outcomes Baseline:  Goal status: INITIAL  2.  Pt to report balance issues not impeding her walking routine Baseline: not yet initiated, apprehensive Goal status: INITIAL  3.  Demo improved postural control per mild sway x 30 sec condition 4 M-CTSIB to improve safety with ADL Baseline: 17 sec mod-severe Goal status: INITIAL  4.  Improve single leg stance to 10 sec for reduced risk for falls  Baseline: 3-5 sec  Goal status: INITIAL  ASSESSMENT:  CLINICAL IMPRESSION: Patient is a 83 y.o. lady who was seen today for physical therapy evaluation and treatment for Imbalance.  Notes following recent ear infection feeling off-balanced  and apprehensive about walking outdoors.  Thankfully, balance/fall risk outcome measures reveal low risk for falls per [facility] Test, DGI, 5xSTS test, and maintains an appropriate walking speed for community-level ambulation and navigates level surfaces and stairs independently.  Chief difficulty was found to be multisensory balance demands of M-CTSIB with considerable unsteadiness with condition 4 but +Romberg noted and difficulty with single leg stance.  Initiated intervention with HEP strategies to address balance/postural control deficits with good return demonstration.  Pt would benefit from additional sessions to progress and refine HEP to address her limitations to enable return to PLOF.   OBJECTIVE IMPAIRMENTS: decreased balance and difficulty walking.   ACTIVITY LIMITATIONS: carrying and locomotion level  PARTICIPATION LIMITATIONS: community activity  PERSONAL FACTORS: Age and Time since onset of injury/illness/exacerbation are also affecting patient's functional outcome.   REHAB POTENTIAL: Excellent  CLINICAL DECISION MAKING: Stable/uncomplicated  EVALUATION COMPLEXITY: Low  PLAN:  PT FREQUENCY: 3 sessions  PT DURATION: 3 sessions  PLANNED INTERVENTIONS: 97750- Physical Performance Testing, 97110-Therapeutic exercises, 97530- Therapeutic  activity, [id number]- Neuromuscular re-education, 97535- Self Care, [id number]- Manual therapy, [id number]- Gait training, [phone]- Canalith repositioning, [id number]- Aquatic Therapy, [phone] (1-2 muscles), [id number] (3+ muscles)- Dry Needling, and Patient/Family education  PLAN FOR NEXT SESSION: HEP review and multisensory progressions   4:33 PM, 12/31/2023 M. Kelly Yanil Dawe, PT, DPT Physical Therapist- [place] Office Number: [phone]         "

## 2024-01-17 ENCOUNTER — Ambulatory Visit

## 2024-01-28 ENCOUNTER — Other Ambulatory Visit: Payer: Self-pay | Admitting: Family Medicine

## 2024-02-07 ENCOUNTER — Ambulatory Visit: Attending: Family Medicine

## 2024-02-07 DIAGNOSIS — R262 Difficulty in walking, not elsewhere classified: Secondary | ICD-10-CM | POA: Insufficient documentation

## 2024-02-07 DIAGNOSIS — R2681 Unsteadiness on feet: Secondary | ICD-10-CM | POA: Diagnosis present

## 2024-02-07 NOTE — Therapy (Signed)
 " OUTPATIENT PHYSICAL THERAPY NEURO TREATMENT, Recertification, and D/C Summary   Patient Name: Joan Mclaughlin MRN: [id number] DOB:Oct 10, 1940, 84 y.o., female Today's Date: 02/07/2024   PCP: Kennyth Worth HERO, MD REFERRING PROVIDER: Kennyth Worth HERO, MD  PHYSICAL THERAPY DISCHARGE SUMMARY  Visits from Start of Care: 2  Current functional level related to goals / functional outcomes: Goals met   Remaining deficits: none   Education / Equipment: HEP   Patient agrees to discharge. Patient goals were met. Patient is being discharged due to meeting the stated rehab goals.   END OF SESSION:  PT End of Session - 02/07/24 1104     Visit Number 2    Number of Visits 3    Date for Recertification  02/25/24    Authorization Type [employer]    PT Start Time 1104    PT Stop Time 1145    PT Time Calculation (min) 41 min          Past Medical History:  Diagnosis Date   Alcoholism (HCC)    sober for 47 years-noted 09/16/2018   Allergy    Anxiety    Cancer (HCC)    skin cancer   Cholecystitis    Complication of anesthesia    Difficulty sleeping    takes med to sleep   Diverticulitis    Family history of adverse reaction to anesthesia    SIster - N/V   Gallstones    Ganglion cyst 12/11/2016   GERD (gastroesophageal reflux disease)    RELATED TO GALLBLADDER   Heart murmur    ONLY DETECTED WHEN LYING DOWN  - 10/16/2018- Not concerned with   History of Lyme disease 09/19/2016   Hyperlipidemia    Hypertension    Lyme disease    Pt states she has had Lyme Disease twice.    Nocturia    Pneumonia    PONV (postoperative nausea and vomiting)    Nausea   S/P laparoscopic cholecystectomy 12/23/2014   Seizure disorder (HCC)    Seizures (HCC)    Seizures (HCC)    last one 10- 15 years ago - 10/16/2018   Squamous cell carcinoma of skin 12/25/2018   RIGHT FOREARM    Squamous cell carcinoma of skin 09/30/2019   in situ-left lower leg-anterior    Trimalleolar fracture    left   Tubular adenoma of colon 06/27/2018   Past Surgical History:  Procedure Laterality Date   CHOLECYSTECTOMY N/A 12/23/2014   Procedure: LAPAROSCOPIC CHOLECYSTECTOMY WITH INTRAOPERATIVE CHOLANGIOGRAM;  Surgeon: Camellia Blush, MD;  Location: [facility] ORS;  Service: General;  Laterality: N/A;   COLONOSCOPY     3 times trying to get large polyp since August 2020   COLONOSCOPY WITH PROPOFOL  N/A 10/20/2018   Procedure: COLONOSCOPY WITH PROPOFOL ;  Surgeon: Wilhelmenia Aloha Raddle., MD;  Location: [facility] ENDOSCOPY;  Service: Gastroenterology;  Laterality: N/A;   COLONOSCOPY WITH PROPOFOL  N/A 11/02/2019   Procedure: COLONOSCOPY WITH PROPOFOL ;  Surgeon: Mansouraty, Aloha Raddle., MD;  Location: [facility] ENDOSCOPY;  Service: Gastroenterology;  Laterality: N/A;   COLONOSCOPY WITH PROPOFOL  N/A 08/31/2020   Procedure: COLONOSCOPY WITH PROPOFOL ;  Surgeon: Wilhelmenia Aloha Raddle., MD;  Location: [facility] ENDOSCOPY;  Service: Gastroenterology;  Laterality: N/A;   ENDOSCOPIC MUCOSAL RESECTION N/A 10/20/2018   Procedure: ENDOSCOPIC MUCOSAL RESECTION;  Surgeon: Wilhelmenia Aloha Raddle., MD;  Location: [facility] ENDOSCOPY;  Service: Gastroenterology;  Laterality: N/A;   ENDOSCOPIC MUCOSAL RESECTION  11/02/2019   Procedure: ENDOSCOPIC MUCOSAL RESECTION;  Surgeon: Wilhelmenia Aloha Raddle., MD;  Location:  [facility] ENDOSCOPY;  Service: Gastroenterology;;   HEMOSTASIS CLIP PLACEMENT  10/20/2018   Procedure: HEMOSTASIS CLIP PLACEMENT;  Surgeon: Wilhelmenia Aloha Raddle., MD;  Location: [facility] ENDOSCOPY;  Service: Gastroenterology;;   HEMOSTASIS CLIP PLACEMENT  11/02/2019   Procedure: HEMOSTASIS CLIP PLACEMENT;  Surgeon: Wilhelmenia Aloha Raddle., MD;  Location: [facility] ENDOSCOPY;  Service: Gastroenterology;;   ORIF ANKLE FRACTURE Left 02/19/2019   Procedure: OPEN REDUCTION INTERNAL FIXATION (ORIF) LEFT ANKLE FRACTURE;  Surgeon: Elsa Lonni SAUNDERS, MD;  Location: [facility] OR;  Service: Orthopedics;  Laterality: Left;   POLYPECTOMY  10/20/2018    Procedure: POLYPECTOMY;  Surgeon: Mansouraty, Aloha Raddle., MD;  Location: [facility] ENDOSCOPY;  Service: Gastroenterology;;   POLYPECTOMY  11/02/2019   Procedure: POLYPECTOMY;  Surgeon: Wilhelmenia Aloha Raddle., MD;  Location: [facility] ENDOSCOPY;  Service: Gastroenterology;;   POLYPECTOMY  08/31/2020   Procedure: POLYPECTOMY;  Surgeon: Wilhelmenia Aloha Raddle., MD;  Location: [facility] ENDOSCOPY;  Service: Gastroenterology;;   SUBMUCOSAL LIFTING INJECTION  10/20/2018   Procedure: SUBMUCOSAL LIFTING INJECTION;  Surgeon: Wilhelmenia Aloha Raddle., MD;  Location: [facility] ENDOSCOPY;  Service: Gastroenterology;;   SYNDESMOSIS REPAIR Left 02/19/2019   Procedure: SYNDESMOSIS REPAIR;  Surgeon: Elsa Lonni SAUNDERS, MD;  Location: [facility] OR;  Service: Orthopedics;  Laterality: Left;   THORACIC DISCECTOMY  07/2007   T12   TUBAL LIGATION     WISDOM TOOTH EXTRACTION     Patient Active Problem List   Diagnosis Date Noted   Imbalance 12/12/2023   Colon cancer screening declined 03/22/2023   History of Clostridium difficile infection 03/22/2023   Hx of adenomatous colonic polyps 03/22/2023   Diarrhea 11/26/2022   Seborrheic dermatitis 01/16/2021   Stress 01/16/2021   Osteoarthritis 06/30/2020   Diverticulosis 05/10/2020   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   Hypertension 10/14/2018   Abnormal colonoscopy 09/18/2018   History of colonic polyps 09/16/2018   Tubular adenoma of colon 06/27/2018   Dyslipidemia 03/06/2018   Allergic rhinitis 03/05/2018   History of cold sores 03/05/2018   Dermatochalasis of both upper eyelids 01/12/2018   Constipation 01/14/2017   Localization-related idiopathic epilepsy and epileptic syndromes with seizures of localized onset, not intractable, without status epilepticus (HCC) 03/02/2015   Insomnia 05/02/2013    ONSET DATE: 12/2023  REFERRING DIAG:  R26.89 (ICD-10-CM) - Imbalance    THERAPY DIAG:  Unsteadiness on feet  Difficulty in walking, not elsewhere classified  Rationale for  Evaluation and Treatment: Rehabilitation  SUBJECTIVE:  SUBJECTIVE STATEMENT: Had the flu and was under the weather for a while but no issues of imabalance/dizziness.  Mostly notice imbalance later in day with fatigue as in after shopping and walking back to car from returning cart Pt accompanied by: self  PERTINENT HISTORY: recent hx of ear infection  PAIN:  Are you having pain? No  PRECAUTIONS: None  RED FLAGS: None   WEIGHT BEARING RESTRICTIONS: No  FALLS: Has patient fallen in last 6 months? No  LIVING ENVIRONMENT: Lives with: lives alone Lives in: House/apartment Stairs: 1-2 STE, ground floor set-up Has following equipment at home: None  PLOF: Independent  PATIENT GOALS:   OBJECTIVE:   TODAY'S TREATMENT: 02/07/24 Activity Comments  HEP review   HEP progressions   M-CTSIB Condition 4 mild x 27 sec  Single leg stance 10-11 sec           Note: Objective measures were completed at Evaluation unless otherwise noted.  DIAGNOSTIC FINDINGS: n/a for episode  COGNITION: Overall cognitive status: Within functional limits for tasks assessed   SENSATION: WFL  COORDINATION: WNL  EDEMA:  none  MUSCLE TONE: WNL    DTRs:  NT  POSTURE: No Significant postural limitations  LOWER EXTREMITY ROM:     WNL LOWER EXTREMITY MMT:    5/5 to seated resisted tests  BED MOBILITY:  Not tested  TRANSFERS: Independent  CURB:  Findings: independent  STAIRS: Findings: Comments: reciprocal, no HR-independent GAIT: Findings: Comments: WNL  FUNCTIONAL TESTS:   10 meter walk test: 9 sec = 3.6 ft/sec 5 times sit to stand: 13 sec +Romberg Single leg stance: 3-5 sec M-CTSIB  Condition 1: Firm Surface, EO 30 Sec, Normal and Mild Sway  Condition 2: Firm Surface, EC 30 Sec, Mild  Sway  Condition 3: Foam Surface, EO 30 Sec, Mild Sway  Condition 4: Foam Surface, EC 17 Sec, Moderate and Severe Sway    Berg Balance Scale:  Item Test date: 12/31/23 Date:  Date:   Sitting to standing 4. able to stand without using hands and stabilize independently Insert SmartPhrase OPRCBERGREEVAL Insert SmartPhrase OPRCBERGREEVAL  2. Standing unsupported 4. able to stand safely for 2 minutes    3. Sitting with back unsupported, feet supported 4. able to sit safely and securely for 2 minutes    4. Standing to sitting 4. sits safely with minimal use of hands    5. Pivot transfer  4. able to transfer safely with minor use of hands    6. Standing unsupported with eyes closed 4. able to stand 10 seconds safely    7. Standing unsupported with feet together 4. able to place feet together independently and stand 1 minute safely    8. Reaching forward with outstretched arms while standing 4. can reach forward confidently 25 cm (10 inches)    9. Pick up object from the floor from standing 4. able to pick up slipper safely and easily    10. Turning to look behind over left and right shoulders while standing 4. looks behind from both sides and weight shifts well    11. Turn 360 degrees 4. able to turn 360 degrees safely in 4 seconds or less    12. Place alternate foot on step or stool while standing unsupported 4. ble to stand independently and safely and complete 8 steps in 20 seconds    13. Standing unsupported one foot in front 3. able to place foot ahead independently and hold 30 seconds    14. Standing on  one leg 2. able to lift leg independently and hold >= 3 seconds      Total Score 53/56 Total Score:    Total Score:     Dynamic Gait Index: Dynamic Gait Index  Mark the lowest level that applies.   Date Performed 12/31/23  Gait level surface (3) Normal: walks 20', no AD, good speed, no evidence for imbalance, normal gait pattern  2. Change in gait speed (3) Normal: Able to smoothly change  walking speed without loss of balance or gait deviation. Shows a significant difference in walking speeds between normal, fast and slow speeds  3. Gait with horizontal head turns (3) Normal: Performs head turns smoothly with no change in gait  4. Gait with vertical head turns (3) Normal: Performs head turns smoothly with no change in gait  5. Gait and pivot turn (3) Normal: Pivot turns safely within 3 seconds and stops quickly with no loss of balance  6. Step over obstacle (3) Normal: Is able to step over the box without changing gait speed, no evidence of imbalance  7. Step around obstacle (3) Normal: Is able to walk around cones safely without changing gait speed; no evidence of imbalance  8. Steps (3) Normal: Alternating feet, no rail  Total score 24/24    Score Interpretation: Score of <19 indicates high risk of falls.  Minimally Clinically Important Difference (MCID):  =DGI scores of<21/24 = 1.80 points DGI scores of >21/24 = 0.60 points   Dilley T, Inbar-Borovsky N, Brozgol M, Giladi N, [place] JM. The Dynamic Gait Index in healthy older adults: the role of stair climbing, fear of falling and gender. Gait Posture. 2009 Feb;29(2):237-41. doi: 10.1016/j.gaitpost.2008.08.013. Epub 2008 Oct 8. PMID: 81154560; PMCID: [id number].  Pardasaney, MYRTIS LOIS Bonus, GEANNIE POUR., et al. (2012). Sensitivity to change and responsiveness of four balance measures for community-dwelling older adults. Physical therapy 92(3): 388-397.   PATIENT SURVEYS:                                                                                                                                TREATMENT DATE: 12/31/23    PATIENT EDUCATION: Education details: assessment details, outcome measure scores/interpretation, HEP initiation Person educated: Patient Education method: Explanation, Demonstration, and Handouts Education comprehension: needs further education  HOME EXERCISE PROGRAM: Access Code: [email or web address] URL:  [email or web address] Date: 12/31/2023 Prepared by: Kelly Basilia Stuckert  Exercises - Corner Balance Feet Together With Eyes Closed  - 3-5 x weekly - 3 sets - 30 sec hold - Corner Balance Feet Together: Eyes Closed With Head Turns  - 3-5 x weekly - 3 sets - 30 sec hold - Tandem Stance in Corner  - 3-5 x weekly - 3 sets - 15-30 sec hold - Standing Foot Lift on Box (BKA)  - 3-5 x weekly - 1-3 sets - 10 reps - Standing Corner Balance in Semi-tandem With Cross-Body Reaches and Chair in Front  - 3-5 x weekly - 1-3  sets - 10 reps  GOALS: Goals reviewed with patient? Yes  SHORT TERM GOALS: Target date: same as LTG   LONG TERM GOALS: Target date: 01/28/2024    Patient will be independent in HEP to improve functional outcomes Baseline:  Goal status: MET  2.  Pt to report balance issues not impeding her walking routine Baseline: not yet initiated, apprehensive; pt reports recent hx of flu limiting outdoor walking Goal status: NOT MET  3.  Demo improved postural control per mild sway x 30 sec condition 4 M-CTSIB to improve safety with ADL Baseline: 17 sec mod-severe; mild x 27 sec Goal status: partial  4.  Improve single leg stance to 10 sec for reduced risk for falls  Baseline: 3-5 sec; 10-11 sec  Goal status: MET  ASSESSMENT:  CLINICAL IMPRESSION: Returns to clinic and reports recent hx of flu (2 weeks ago with symptoms resolved) which has limited her activity but no worse issues of imbalance or dizziness.  HEP review with excellent recall and provided updates for increase challenge and progressions with good return demonstration.  M-CTSIB reveals improved postural control with mild sway x 27 sec condition 4.  Single leg stance also improved from 10-11 sec with compensatory movements of trunk needed but maintains single leg stance. Pt reports feeling confident in self-management at this time and will f/u with MD prn if additional sessions are felt to be needed   OBJECTIVE  IMPAIRMENTS: decreased balance and difficulty walking.   ACTIVITY LIMITATIONS: carrying and locomotion level  PARTICIPATION LIMITATIONS: community activity  PERSONAL FACTORS: Age and Time since onset of injury/illness/exacerbation are also affecting patient's functional outcome.   REHAB POTENTIAL: Excellent  CLINICAL DECISION MAKING: Stable/uncomplicated  EVALUATION COMPLEXITY: Low  PLAN:  PT FREQUENCY: 3 sessions  PT DURATION: 3 sessions  PLANNED INTERVENTIONS: 97750- Physical Performance Testing, 97110-Therapeutic exercises, 97530- Therapeutic activity, [id number]- Neuromuscular re-education, 97535- Self Care, [id number]- Manual therapy, [id number]- Gait training, [id number]- Canalith repositioning, [id number]- Aquatic Therapy, [phone] (1-2 muscles), [id number] (3+ muscles)- Dry Needling, and Patient/Family education  PLAN FOR NEXT SESSION: D/C at this time   11:48 AM, 02/07/24 M. Kelly Romani Wilbon, PT, DPT Physical Therapist- [place] Office Number: [phone]         "

## 2024-02-17 ENCOUNTER — Ambulatory Visit: Admitting: Neurology

## 2024-09-02 ENCOUNTER — Ambulatory Visit: Admitting: Dermatology

## 2024-09-21 ENCOUNTER — Ambulatory Visit
